# Patient Record
Sex: Male | Born: 1945 | Race: White | Hispanic: No | Marital: Married | Smoking: Never smoker
Health system: Southern US, Community
[De-identification: ages and names within clinical notes are randomized; demographics above are authoritative.]

## PROBLEM LIST (undated history)

## (undated) DIAGNOSIS — C44229 Squamous cell carcinoma of skin of left ear and external auricular canal: Secondary | ICD-10-CM

## (undated) DIAGNOSIS — I10 Essential (primary) hypertension: Secondary | ICD-10-CM

## (undated) DIAGNOSIS — K219 Gastro-esophageal reflux disease without esophagitis: Secondary | ICD-10-CM

## (undated) DIAGNOSIS — G629 Polyneuropathy, unspecified: Secondary | ICD-10-CM

## (undated) DIAGNOSIS — S61509A Unspecified open wound of unspecified wrist, initial encounter: Secondary | ICD-10-CM

## (undated) DIAGNOSIS — E785 Hyperlipidemia, unspecified: Secondary | ICD-10-CM

## (undated) DIAGNOSIS — N419 Inflammatory disease of prostate, unspecified: Secondary | ICD-10-CM

## (undated) DIAGNOSIS — G4730 Sleep apnea, unspecified: Secondary | ICD-10-CM

## (undated) DIAGNOSIS — K5909 Other constipation: Secondary | ICD-10-CM

## (undated) DIAGNOSIS — M25519 Pain in unspecified shoulder: Secondary | ICD-10-CM

## (undated) DIAGNOSIS — M1990 Unspecified osteoarthritis, unspecified site: Secondary | ICD-10-CM

## (undated) DIAGNOSIS — Z5189 Encounter for other specified aftercare: Secondary | ICD-10-CM

## (undated) DIAGNOSIS — J309 Allergic rhinitis, unspecified: Secondary | ICD-10-CM

## (undated) DIAGNOSIS — M542 Cervicalgia: Secondary | ICD-10-CM

## (undated) DIAGNOSIS — Z860100 Personal history of colon polyps, unspecified: Secondary | ICD-10-CM

## (undated) DIAGNOSIS — R079 Chest pain, unspecified: Secondary | ICD-10-CM

## (undated) DIAGNOSIS — T753XXA Motion sickness, initial encounter: Secondary | ICD-10-CM

## (undated) DIAGNOSIS — G709 Myoneural disorder, unspecified: Secondary | ICD-10-CM

## (undated) DIAGNOSIS — M549 Dorsalgia, unspecified: Secondary | ICD-10-CM

## (undated) DIAGNOSIS — R112 Nausea with vomiting, unspecified: Secondary | ICD-10-CM

## (undated) DIAGNOSIS — K5730 Diverticulosis of large intestine without perforation or abscess without bleeding: Secondary | ICD-10-CM

## (undated) DIAGNOSIS — Z9889 Other specified postprocedural states: Secondary | ICD-10-CM

## (undated) DIAGNOSIS — J189 Pneumonia, unspecified organism: Secondary | ICD-10-CM

## (undated) DIAGNOSIS — R51 Headache: Secondary | ICD-10-CM

## (undated) DIAGNOSIS — G4700 Insomnia, unspecified: Secondary | ICD-10-CM

## (undated) DIAGNOSIS — R42 Dizziness and giddiness: Secondary | ICD-10-CM

## (undated) DIAGNOSIS — Z8679 Personal history of other diseases of the circulatory system: Secondary | ICD-10-CM

## (undated) HISTORY — DX: Personal history of colon polyps, unspecified: Z86.0100

## (undated) HISTORY — PX: OTHER SURGICAL HISTORY: SHX169

## (undated) HISTORY — DX: Personal history of colonic polyps: Z86.010

## (undated) HISTORY — DX: Sleep apnea, unspecified: G47.30

## (undated) HISTORY — DX: Unspecified osteoarthritis, unspecified site: M19.90

## (undated) HISTORY — DX: Encounter for other specified aftercare: Z51.89

## (undated) HISTORY — DX: Inflammatory disease of prostate, unspecified: N41.9

## (undated) HISTORY — DX: Gastro-esophageal reflux disease without esophagitis: K21.9

## (undated) HISTORY — DX: Diverticulosis of large intestine without perforation or abscess without bleeding: K57.30

## (undated) HISTORY — DX: Hyperlipidemia, unspecified: E78.5

## (undated) HISTORY — PX: TONSILLECTOMY: SHX5217

## (undated) HISTORY — DX: Polyneuropathy, unspecified: G62.9

## (undated) HISTORY — DX: Motion sickness, initial encounter: T75.3XXA

## (undated) HISTORY — DX: Unspecified open wound of unspecified wrist, initial encounter: S61.509A

## (undated) HISTORY — DX: Personal history of other diseases of the circulatory system: Z86.79

## (undated) HISTORY — PX: ESOPHAGOGASTRODUODENOSCOPY: SHX1529

## (undated) HISTORY — PX: REPLACEMENT TOTAL KNEE BILATERAL: SUR1225

## (undated) HISTORY — DX: Insomnia, unspecified: G47.00

## (undated) HISTORY — DX: Headache: R51

## (undated) HISTORY — DX: Chest pain, unspecified: R07.9

## (undated) HISTORY — DX: Pain in unspecified shoulder: M25.519

## (undated) HISTORY — PX: COLONOSCOPY: SHX174

## (undated) HISTORY — DX: Dizziness and giddiness: R42

## (undated) HISTORY — DX: Dorsalgia, unspecified: M54.9

## (undated) HISTORY — PX: CYST EXCISION: SHX5701

## (undated) HISTORY — DX: Allergic rhinitis, unspecified: J30.9

## (undated) HISTORY — DX: Pneumonia, unspecified organism: J18.9

## (undated) HISTORY — DX: Squamous cell carcinoma of skin of left ear and external auricular canal: C44.229

## (undated) HISTORY — DX: Cervicalgia: M54.2

## (undated) HISTORY — DX: Essential (primary) hypertension: I10

## (undated) HISTORY — DX: Other constipation: K59.09

## (undated) HISTORY — DX: Myoneural disorder, unspecified: G70.9

---

## 1998-09-24 ENCOUNTER — Other Ambulatory Visit: Admission: RE | Admit: 1998-09-24 | Discharge: 1998-09-24 | Payer: Self-pay | Admitting: Gastroenterology

## 2000-06-30 HISTORY — PX: ANKLE ARTHROSCOPY W/ OPEN REPAIR: SHX1145

## 2000-08-06 ENCOUNTER — Ambulatory Visit (HOSPITAL_COMMUNITY): Admission: RE | Admit: 2000-08-06 | Discharge: 2000-08-06 | Payer: Self-pay | Admitting: Gastroenterology

## 2000-08-06 ENCOUNTER — Encounter: Payer: Self-pay | Admitting: Gastroenterology

## 2001-06-11 ENCOUNTER — Encounter: Payer: Self-pay | Admitting: Specialist

## 2001-06-11 ENCOUNTER — Inpatient Hospital Stay (HOSPITAL_COMMUNITY): Admission: EM | Admit: 2001-06-11 | Discharge: 2001-06-14 | Payer: Self-pay | Admitting: *Deleted

## 2001-06-11 ENCOUNTER — Encounter: Payer: Self-pay | Admitting: Emergency Medicine

## 2003-07-31 ENCOUNTER — Inpatient Hospital Stay (HOSPITAL_COMMUNITY): Admission: RE | Admit: 2003-07-31 | Discharge: 2003-08-03 | Payer: Self-pay | Admitting: Orthopedic Surgery

## 2003-08-22 ENCOUNTER — Encounter: Admission: RE | Admit: 2003-08-22 | Discharge: 2003-10-16 | Payer: Self-pay | Admitting: Orthopedic Surgery

## 2004-06-05 ENCOUNTER — Ambulatory Visit: Payer: Self-pay | Admitting: Internal Medicine

## 2004-07-05 ENCOUNTER — Ambulatory Visit: Payer: Self-pay | Admitting: Internal Medicine

## 2004-09-02 ENCOUNTER — Ambulatory Visit: Payer: Self-pay | Admitting: Internal Medicine

## 2004-09-05 ENCOUNTER — Encounter: Admission: RE | Admit: 2004-09-05 | Discharge: 2004-11-13 | Payer: Self-pay | Admitting: Internal Medicine

## 2004-11-04 ENCOUNTER — Ambulatory Visit: Payer: Self-pay | Admitting: Internal Medicine

## 2005-01-23 ENCOUNTER — Ambulatory Visit: Payer: Self-pay | Admitting: Internal Medicine

## 2005-04-18 ENCOUNTER — Ambulatory Visit: Payer: Self-pay | Admitting: Internal Medicine

## 2005-04-25 ENCOUNTER — Ambulatory Visit: Payer: Self-pay | Admitting: Internal Medicine

## 2005-07-18 ENCOUNTER — Ambulatory Visit: Payer: Self-pay | Admitting: Internal Medicine

## 2005-07-25 ENCOUNTER — Ambulatory Visit: Payer: Self-pay | Admitting: Internal Medicine

## 2005-09-12 ENCOUNTER — Ambulatory Visit: Payer: Self-pay | Admitting: Internal Medicine

## 2005-09-23 ENCOUNTER — Emergency Department (HOSPITAL_COMMUNITY): Admission: EM | Admit: 2005-09-23 | Discharge: 2005-09-24 | Payer: Self-pay | Admitting: Emergency Medicine

## 2005-10-24 ENCOUNTER — Ambulatory Visit: Payer: Self-pay | Admitting: Internal Medicine

## 2005-11-19 ENCOUNTER — Ambulatory Visit: Payer: Self-pay | Admitting: Internal Medicine

## 2005-12-24 ENCOUNTER — Ambulatory Visit: Payer: Self-pay | Admitting: Internal Medicine

## 2006-02-02 ENCOUNTER — Ambulatory Visit: Payer: Self-pay | Admitting: Internal Medicine

## 2006-05-14 ENCOUNTER — Ambulatory Visit: Payer: Self-pay | Admitting: Internal Medicine

## 2006-05-25 ENCOUNTER — Encounter: Payer: Self-pay | Admitting: Cardiology

## 2006-06-01 ENCOUNTER — Inpatient Hospital Stay (HOSPITAL_COMMUNITY): Admission: RE | Admit: 2006-06-01 | Discharge: 2006-06-04 | Payer: Self-pay | Admitting: Orthopedic Surgery

## 2006-06-17 ENCOUNTER — Ambulatory Visit: Payer: Self-pay | Admitting: Internal Medicine

## 2006-06-17 ENCOUNTER — Inpatient Hospital Stay (HOSPITAL_COMMUNITY): Admission: EM | Admit: 2006-06-17 | Discharge: 2006-06-20 | Payer: Self-pay | Admitting: Emergency Medicine

## 2006-06-22 ENCOUNTER — Encounter: Payer: Self-pay | Admitting: Vascular Surgery

## 2006-06-22 ENCOUNTER — Ambulatory Visit: Admission: RE | Admit: 2006-06-22 | Discharge: 2006-06-22 | Payer: Self-pay | Admitting: Internal Medicine

## 2006-06-22 ENCOUNTER — Ambulatory Visit: Payer: Self-pay | Admitting: Internal Medicine

## 2006-06-24 ENCOUNTER — Encounter: Payer: Self-pay | Admitting: Vascular Surgery

## 2006-06-24 ENCOUNTER — Ambulatory Visit: Payer: Self-pay | Admitting: Gastroenterology

## 2006-06-24 ENCOUNTER — Ambulatory Visit (HOSPITAL_COMMUNITY): Admission: RE | Admit: 2006-06-24 | Discharge: 2006-06-24 | Payer: Self-pay | Admitting: Internal Medicine

## 2006-07-01 ENCOUNTER — Encounter: Admission: RE | Admit: 2006-07-01 | Discharge: 2006-07-30 | Payer: Self-pay | Admitting: Orthopedic Surgery

## 2006-07-07 ENCOUNTER — Ambulatory Visit: Payer: Self-pay | Admitting: Gastroenterology

## 2006-07-22 ENCOUNTER — Ambulatory Visit: Payer: Self-pay | Admitting: Gastroenterology

## 2006-07-22 ENCOUNTER — Encounter: Payer: Self-pay | Admitting: Internal Medicine

## 2006-07-31 ENCOUNTER — Encounter: Admission: RE | Admit: 2006-07-31 | Discharge: 2006-09-03 | Payer: Self-pay | Admitting: Orthopedic Surgery

## 2006-08-24 ENCOUNTER — Ambulatory Visit: Payer: Self-pay | Admitting: Internal Medicine

## 2006-08-24 LAB — CONVERTED CEMR LAB
AST: 21 units/L (ref 0–37)
Albumin: 4.1 g/dL (ref 3.5–5.2)
Alkaline Phosphatase: 66 units/L (ref 39–117)
BUN: 10 mg/dL (ref 6–23)
Calcium: 9.8 mg/dL (ref 8.4–10.5)
Cholesterol: 314 mg/dL (ref 0–200)
Creatinine, Ser: 1.1 mg/dL (ref 0.4–1.5)
HDL: 41.4 mg/dL (ref >39.0)
Hemoglobin: 15.2 g/dL (ref 13.0–17.0)
MCHC: 33.8 g/dL (ref 30.0–36.0)
Monocytes Absolute: 0.7 10*3/uL (ref 0.2–0.7)
Neutro Abs: 7.2 10*3/uL (ref 1.4–7.7)
Neutrophils Relative %: 63 % (ref 43.0–77.0)
Potassium: 4.5 meq/L (ref 3.5–5.1)
RBC: 5.12 M/uL (ref 4.22–5.81)
Sodium: 140 meq/L (ref 135–145)
Total Protein: 7.4 g/dL (ref 6.0–8.3)
Triglycerides: 289 mg/dL (ref 0–149)
VLDL: 58 mg/dL — ABNORMAL HIGH (ref 0–40)
WBC: 11.3 10*3/uL — ABNORMAL HIGH (ref 4.5–10.5)

## 2006-08-31 ENCOUNTER — Ambulatory Visit: Payer: Self-pay | Admitting: Internal Medicine

## 2006-09-04 ENCOUNTER — Ambulatory Visit (HOSPITAL_COMMUNITY): Admission: RE | Admit: 2006-09-04 | Discharge: 2006-09-04 | Payer: Self-pay | Admitting: Internal Medicine

## 2006-09-04 ENCOUNTER — Encounter: Payer: Self-pay | Admitting: Internal Medicine

## 2006-12-03 ENCOUNTER — Ambulatory Visit: Payer: Self-pay | Admitting: Internal Medicine

## 2006-12-03 LAB — CONVERTED CEMR LAB
ALT: 23 U/L
AST: 23 U/L
Albumin: 4.2 g/dL
Alkaline Phosphatase: 64 U/L
Basophils Absolute: 0.1 K/uL
Basophils Relative: 0.7 %
Bilirubin, Direct: 0.1 mg/dL
Cholesterol: 232 mg/dL
Direct LDL: 153.9 mg/dL
Eosinophils Absolute: 0.1 K/uL
Eosinophils Relative: 1.2 %
HCT: 45.7 %
HDL: 41 mg/dL
Hemoglobin: 15.2 g/dL
Lymphocytes Relative: 25.2 %
MCHC: 33.3 g/dL
MCV: 91.4 fL
Monocytes Absolute: 0.8 K/uL — ABNORMAL HIGH
Monocytes Relative: 7.1 %
Neutro Abs: 7.7 K/uL
Neutrophils Relative %: 65.8 %
Platelets: 263 K/uL
RBC: 4.99 M/uL
RDW: 13.5 %
Total Bilirubin: 0.9 mg/dL
Total CHOL/HDL Ratio: 5.7
Total Protein: 7.5 g/dL
Triglycerides: 197 mg/dL — ABNORMAL HIGH
VLDL: 39 mg/dL
WBC: 11.6 10*3/microliter — ABNORMAL HIGH

## 2006-12-24 DIAGNOSIS — K219 Gastro-esophageal reflux disease without esophagitis: Secondary | ICD-10-CM | POA: Insufficient documentation

## 2006-12-24 DIAGNOSIS — M1990 Unspecified osteoarthritis, unspecified site: Secondary | ICD-10-CM

## 2006-12-24 DIAGNOSIS — E785 Hyperlipidemia, unspecified: Secondary | ICD-10-CM | POA: Insufficient documentation

## 2006-12-24 DIAGNOSIS — I10 Essential (primary) hypertension: Secondary | ICD-10-CM | POA: Insufficient documentation

## 2006-12-24 HISTORY — DX: Gastro-esophageal reflux disease without esophagitis: K21.9

## 2006-12-24 HISTORY — DX: Hyperlipidemia, unspecified: E78.5

## 2006-12-24 HISTORY — DX: Unspecified osteoarthritis, unspecified site: M19.90

## 2006-12-24 HISTORY — DX: Essential (primary) hypertension: I10

## 2007-01-29 ENCOUNTER — Telehealth: Payer: Self-pay | Admitting: *Deleted

## 2007-03-09 ENCOUNTER — Ambulatory Visit: Payer: Self-pay | Admitting: Internal Medicine

## 2007-03-09 DIAGNOSIS — T887XXA Unspecified adverse effect of drug or medicament, initial encounter: Secondary | ICD-10-CM | POA: Insufficient documentation

## 2007-03-09 DIAGNOSIS — R42 Dizziness and giddiness: Secondary | ICD-10-CM | POA: Insufficient documentation

## 2007-03-09 DIAGNOSIS — K5730 Diverticulosis of large intestine without perforation or abscess without bleeding: Secondary | ICD-10-CM

## 2007-03-09 HISTORY — DX: Diverticulosis of large intestine without perforation or abscess without bleeding: K57.30

## 2007-03-09 HISTORY — DX: Dizziness and giddiness: R42

## 2007-03-09 LAB — CONVERTED CEMR LAB
ALT: 27 units/L (ref 0–53)
AST: 26 units/L (ref 0–37)
Albumin: 4.1 g/dL (ref 3.5–5.2)
Bilirubin, Direct: 0.1 mg/dL (ref 0.0–0.3)
Cholesterol: 249 mg/dL (ref 0–200)
HDL: 39.7 mg/dL (ref >39.0)
Triglycerides: 345 mg/dL (ref 0–149)

## 2007-06-08 ENCOUNTER — Ambulatory Visit: Payer: Self-pay | Admitting: Internal Medicine

## 2007-06-08 LAB — CONVERTED CEMR LAB
Cholesterol, target level: 200 mg/dL
LDL Goal: 100 mg/dL

## 2007-06-11 ENCOUNTER — Telehealth: Payer: Self-pay | Admitting: Internal Medicine

## 2007-07-16 ENCOUNTER — Ambulatory Visit: Payer: Self-pay | Admitting: Internal Medicine

## 2007-07-16 DIAGNOSIS — M25519 Pain in unspecified shoulder: Secondary | ICD-10-CM

## 2007-07-16 HISTORY — DX: Pain in unspecified shoulder: M25.519

## 2007-10-13 ENCOUNTER — Ambulatory Visit: Payer: Self-pay | Admitting: Internal Medicine

## 2007-10-13 DIAGNOSIS — S61509A Unspecified open wound of unspecified wrist, initial encounter: Secondary | ICD-10-CM

## 2007-10-13 HISTORY — DX: Unspecified open wound of unspecified wrist, initial encounter: S61.509A

## 2007-11-17 ENCOUNTER — Encounter: Payer: Self-pay | Admitting: Internal Medicine

## 2007-12-30 ENCOUNTER — Ambulatory Visit: Payer: Self-pay | Admitting: Internal Medicine

## 2007-12-30 DIAGNOSIS — N419 Inflammatory disease of prostate, unspecified: Secondary | ICD-10-CM

## 2007-12-30 HISTORY — DX: Inflammatory disease of prostate, unspecified: N41.9

## 2007-12-30 LAB — CONVERTED CEMR LAB
Basophils Absolute: 0.1 10*3/uL (ref 0.0–0.1)
Basophils Relative: 0.7 % (ref 0.0–1.0)
Bilirubin, Direct: 0.1 mg/dL (ref 0.0–0.3)
CO2: 30 meq/L (ref 19–32)
Chloride: 101 meq/L (ref 96–112)
Eosinophils Absolute: 0.1 10*3/uL (ref 0.0–0.7)
GFR calc non Af Amer: 73 mL/min
Glucose, Bld: 102 mg/dL — ABNORMAL HIGH (ref 70–99)
MCV: 93.7 fL (ref 78.0–100.0)
Monocytes Relative: 8 % (ref 3.0–12.0)
Neutrophils Relative %: 67 % (ref 43.0–77.0)
WBC: 11.1 10*3/uL — ABNORMAL HIGH (ref 4.5–10.5)

## 2008-04-04 ENCOUNTER — Ambulatory Visit: Payer: Self-pay | Admitting: Internal Medicine

## 2008-04-04 LAB — CONVERTED CEMR LAB
Albumin: 4.2 g/dL (ref 3.5–5.2)
Alkaline Phosphatase: 65 units/L (ref 39–117)
BUN: 11 mg/dL (ref 6–23)
Bilirubin, Direct: 0.1 mg/dL (ref 0.0–0.3)
CO2: 31 meq/L (ref 19–32)
Chloride: 103 meq/L (ref 96–112)
Cholesterol: 200 mg/dL (ref 0–200)
Creatinine, Ser: 1.2 mg/dL (ref 0.4–1.5)
Eosinophils Absolute: 0.1 10*3/uL (ref 0.0–0.7)
MCV: 91.2 fL (ref 78.0–100.0)
Monocytes Absolute: 0.8 10*3/uL (ref 0.1–1.0)
Platelets: 226 10*3/uL (ref 150–400)
Potassium: 4.6 meq/L (ref 3.5–5.1)
RBC: 4.97 M/uL (ref 4.22–5.81)
RDW: 12.5 % (ref 11.5–14.6)
Sodium: 141 meq/L (ref 135–145)
Triglycerides: 228 mg/dL (ref 0–149)
VLDL: 46 mg/dL — ABNORMAL HIGH (ref 0–40)
WBC: 10.9 10*3/uL — ABNORMAL HIGH (ref 4.5–10.5)

## 2008-08-29 ENCOUNTER — Ambulatory Visit: Payer: Self-pay | Admitting: Internal Medicine

## 2008-10-03 ENCOUNTER — Ambulatory Visit: Payer: Self-pay | Admitting: Internal Medicine

## 2008-10-04 ENCOUNTER — Encounter: Payer: Self-pay | Admitting: Internal Medicine

## 2008-11-30 ENCOUNTER — Ambulatory Visit: Payer: Self-pay | Admitting: Internal Medicine

## 2008-11-30 LAB — CONVERTED CEMR LAB
Basophils Relative: 0.1 % (ref 0.0–3.0)
CO2: 29 meq/L (ref 19–32)
Chloride: 103 meq/L (ref 96–112)
Cholesterol: 177 mg/dL (ref 0–200)
Eosinophils Absolute: 0.2 10*3/uL (ref 0.0–0.7)
Eosinophils Relative: 1.5 % (ref 0.0–5.0)
Glucose, Bld: 96 mg/dL (ref 70–99)
Lymphocytes Relative: 27.7 % (ref 12.0–46.0)
Lymphs Abs: 3 10*3/uL (ref 0.7–4.0)
Monocytes Absolute: 0.7 10*3/uL (ref 0.1–1.0)
Monocytes Relative: 6.7 % (ref 3.0–12.0)
Neutro Abs: 7 10*3/uL (ref 1.4–7.7)
Neutrophils Relative %: 64 % (ref 43.0–77.0)
PSA: 0.92 ng/mL (ref 0.10–4.00)
Platelets: 202 10*3/uL (ref 150.0–400.0)
Potassium: 3.6 meq/L (ref 3.5–5.1)
RBC: 4.65 M/uL (ref 4.22–5.81)
Specific Gravity, Urine: 1.025
Total CHOL/HDL Ratio: 4
Triglycerides: 219 mg/dL — ABNORMAL HIGH (ref 0.0–149.0)
Urobilinogen, UA: 0.2

## 2008-12-07 ENCOUNTER — Ambulatory Visit: Payer: Self-pay | Admitting: Internal Medicine

## 2009-04-06 ENCOUNTER — Ambulatory Visit: Payer: Self-pay | Admitting: Internal Medicine

## 2009-04-06 DIAGNOSIS — R51 Headache: Secondary | ICD-10-CM

## 2009-04-06 DIAGNOSIS — Z8679 Personal history of other diseases of the circulatory system: Secondary | ICD-10-CM

## 2009-04-06 DIAGNOSIS — R519 Headache, unspecified: Secondary | ICD-10-CM | POA: Insufficient documentation

## 2009-04-06 HISTORY — DX: Headache: R51

## 2009-04-06 HISTORY — DX: Personal history of other diseases of the circulatory system: Z86.79

## 2009-04-06 LAB — CONVERTED CEMR LAB
BUN: 13 mg/dL (ref 6–23)
CO2: 29 meq/L (ref 19–32)
Chloride: 99 meq/L (ref 96–112)
Creatinine, Ser: 1.1 mg/dL (ref 0.4–1.5)
Potassium: 4.4 meq/L (ref 3.5–5.1)

## 2009-04-09 ENCOUNTER — Ambulatory Visit: Payer: Self-pay | Admitting: Internal Medicine

## 2009-04-13 ENCOUNTER — Encounter: Admission: RE | Admit: 2009-04-13 | Discharge: 2009-04-13 | Payer: Self-pay | Admitting: Internal Medicine

## 2009-04-21 ENCOUNTER — Encounter: Payer: Self-pay | Admitting: Cardiology

## 2009-04-21 ENCOUNTER — Emergency Department (HOSPITAL_COMMUNITY): Admission: EM | Admit: 2009-04-21 | Discharge: 2009-04-21 | Payer: Self-pay | Admitting: Emergency Medicine

## 2009-04-25 ENCOUNTER — Ambulatory Visit: Payer: Self-pay | Admitting: Family Medicine

## 2009-04-25 DIAGNOSIS — R079 Chest pain, unspecified: Secondary | ICD-10-CM

## 2009-04-25 HISTORY — DX: Chest pain, unspecified: R07.9

## 2009-04-26 ENCOUNTER — Ambulatory Visit: Payer: Self-pay | Admitting: Cardiology

## 2009-05-01 ENCOUNTER — Telehealth (INDEPENDENT_AMBULATORY_CARE_PROVIDER_SITE_OTHER): Payer: Self-pay | Admitting: *Deleted

## 2009-05-02 ENCOUNTER — Ambulatory Visit: Payer: Self-pay | Admitting: Cardiovascular Disease

## 2009-05-02 ENCOUNTER — Encounter (HOSPITAL_COMMUNITY): Admission: RE | Admit: 2009-05-02 | Discharge: 2009-06-29 | Payer: Self-pay | Admitting: Cardiology

## 2009-05-02 ENCOUNTER — Ambulatory Visit: Payer: Self-pay

## 2009-05-30 ENCOUNTER — Telehealth: Payer: Self-pay | Admitting: Internal Medicine

## 2009-07-04 ENCOUNTER — Ambulatory Visit: Payer: Self-pay | Admitting: Internal Medicine

## 2009-10-03 ENCOUNTER — Ambulatory Visit: Payer: Self-pay | Admitting: Internal Medicine

## 2009-10-03 ENCOUNTER — Encounter (INDEPENDENT_AMBULATORY_CARE_PROVIDER_SITE_OTHER): Payer: Self-pay | Admitting: *Deleted

## 2009-10-05 ENCOUNTER — Telehealth: Payer: Self-pay | Admitting: Internal Medicine

## 2009-11-06 ENCOUNTER — Encounter (INDEPENDENT_AMBULATORY_CARE_PROVIDER_SITE_OTHER): Payer: Self-pay | Admitting: *Deleted

## 2009-11-06 ENCOUNTER — Ambulatory Visit: Payer: Self-pay | Admitting: Gastroenterology

## 2009-11-06 DIAGNOSIS — K5909 Other constipation: Secondary | ICD-10-CM | POA: Insufficient documentation

## 2009-11-06 HISTORY — DX: Other constipation: K59.09

## 2009-11-06 LAB — CONVERTED CEMR LAB
AST: 25 units/L (ref 0–37)
Alkaline Phosphatase: 75 units/L (ref 39–117)
Bilirubin, Direct: 0 mg/dL (ref 0.0–0.3)
CO2: 31 meq/L (ref 19–32)
Calcium: 9.7 mg/dL (ref 8.4–10.5)
Creatinine, Ser: 1.2 mg/dL (ref 0.4–1.5)
Eosinophils Absolute: 0.2 10*3/uL (ref 0.0–0.7)
GFR calc non Af Amer: 66.33 mL/min (ref >60)
Glucose, Bld: 111 mg/dL — ABNORMAL HIGH (ref 70–99)
Hemoglobin: 15.6 g/dL (ref 13.0–17.0)
IgA: 212 mg/dL (ref 68–378)
Iron: 64 ug/dL (ref 42–165)
Lymphocytes Relative: 20.7 % (ref 12.0–46.0)
MCHC: 34.5 g/dL (ref 30.0–36.0)
MCV: 93.2 fL (ref 78.0–100.0)
Monocytes Absolute: 1 10*3/uL (ref 0.1–1.0)
Platelets: 276 10*3/uL (ref 150.0–400.0)
Potassium: 4.4 meq/L (ref 3.5–5.1)
RDW: 13.5 % (ref 11.5–14.6)
Saturation Ratios: 20.9 % (ref 20.0–50.0)
Sed Rate: 12 mm/hr (ref 0–22)
Transferrin: 218.5 mg/dL (ref 212.0–360.0)

## 2009-11-14 ENCOUNTER — Ambulatory Visit: Payer: Self-pay | Admitting: Gastroenterology

## 2009-11-14 LAB — CONVERTED CEMR LAB: UREASE: NEGATIVE

## 2009-11-19 ENCOUNTER — Encounter: Payer: Self-pay | Admitting: Gastroenterology

## 2009-12-04 ENCOUNTER — Telehealth (INDEPENDENT_AMBULATORY_CARE_PROVIDER_SITE_OTHER): Payer: Self-pay | Admitting: *Deleted

## 2010-02-21 ENCOUNTER — Ambulatory Visit: Payer: Self-pay | Admitting: Family Medicine

## 2010-02-21 DIAGNOSIS — M549 Dorsalgia, unspecified: Secondary | ICD-10-CM

## 2010-02-21 HISTORY — DX: Dorsalgia, unspecified: M54.9

## 2010-03-25 ENCOUNTER — Ambulatory Visit: Payer: Self-pay | Admitting: Internal Medicine

## 2010-05-22 ENCOUNTER — Encounter: Payer: Self-pay | Admitting: Internal Medicine

## 2010-05-27 ENCOUNTER — Ambulatory Visit: Payer: Self-pay | Admitting: Internal Medicine

## 2010-05-27 DIAGNOSIS — G4700 Insomnia, unspecified: Secondary | ICD-10-CM | POA: Insufficient documentation

## 2010-05-27 DIAGNOSIS — G4730 Sleep apnea, unspecified: Secondary | ICD-10-CM

## 2010-05-27 DIAGNOSIS — J309 Allergic rhinitis, unspecified: Secondary | ICD-10-CM

## 2010-05-27 DIAGNOSIS — M542 Cervicalgia: Secondary | ICD-10-CM | POA: Insufficient documentation

## 2010-05-27 HISTORY — DX: Insomnia, unspecified: G47.00

## 2010-05-27 HISTORY — DX: Allergic rhinitis, unspecified: J30.9

## 2010-05-27 HISTORY — DX: Cervicalgia: M54.2

## 2010-06-13 ENCOUNTER — Encounter: Payer: Self-pay | Admitting: Internal Medicine

## 2010-06-18 ENCOUNTER — Ambulatory Visit (HOSPITAL_BASED_OUTPATIENT_CLINIC_OR_DEPARTMENT_OTHER): Admission: RE | Admit: 2010-06-18 | Payer: Self-pay | Source: Home / Self Care | Admitting: Internal Medicine

## 2010-06-19 ENCOUNTER — Telehealth: Payer: Self-pay | Admitting: Internal Medicine

## 2010-06-21 ENCOUNTER — Encounter
Admission: RE | Admit: 2010-06-21 | Discharge: 2010-06-21 | Payer: Self-pay | Source: Home / Self Care | Attending: Orthopaedic Surgery | Admitting: Orthopaedic Surgery

## 2010-07-04 ENCOUNTER — Ambulatory Visit: Admit: 2010-07-04 | Payer: Self-pay | Admitting: Family Medicine

## 2010-07-11 ENCOUNTER — Encounter: Payer: Self-pay | Admitting: Internal Medicine

## 2010-07-28 LAB — CONVERTED CEMR LAB: PSA: 0.92 ng/mL

## 2010-07-30 NOTE — Assessment & Plan Note (Signed)
Summary: stomach problems/mhf   Vital Signs:  Patient Profile:   65 Years Old Male Height:     71 inches Weight:      220 pounds Temp:     98.4 degrees F oral Pulse rate:   76 / minute Resp:     14 per minute BP sitting:   140 / 80  (left arm)  Vitals Entered By: Willy Eddy, LPN (August 29, 1608 1:35 PM)                 Chief Complaint:  c/o abd discomfort primarily after eatingt.  History of Present Illness: Increased stomach discomfort related to the chronic dizzines Increased nausia without vomiting Ins laying donw more due to the motion sickness Has reflue of food into the mouth with more chest pressure constipated  Hypertension History:      He denies headache, chest pain, palpitations, dyspnea with exertion, orthopnea, PND, peripheral edema, visual symptoms, neurologic problems, syncope, and side effects from treatment.        Positive major cardiovascular risk factors include male age 38 years old or older, hyperlipidemia, and hypertension.       Prior Medication List:  NEXIUM 40 MG  CPDR (ESOMEPRAZOLE MAGNESIUM) Take 1 tablet by mouth two times a day PROMETHAZINE HCL 50 MG  TABS (PROMETHAZINE HCL) Every 4 hours as needed CRESTOR 10 MG  TABS (ROSUVASTATIN CALCIUM) Take 1 tablet by mouth once a day METAMUCIL   CAPS (PSYLLIUM CAPS) as needed HYDROMORPHONE HCL 4 MG  TABS (HYDROMORPHONE HCL) ONE by mouth Q 6 HOUR PRN MECLIZINE HCL 12.5 MG TABS (MECLIZINE HCL) two by mouth q 6 hour as needed AMITRIPTYLINE HCL 25 MG  TABS (AMITRIPTYLINE HCL) 5 pills each night ACIDOPHILUS EXTRA STRENGTH   CAPS (LACTOBACILLUS) once daily BL STOOL SOFTENER 100 MG  CAPS (DOCUSATE SODIUM) qdhs PROMETHEGAN 25 MG  SUPP (PROMETHAZINE HCL) one pr as needed nausia every 6 hours as needed HYDROCHLOROTHIAZIDE 25 MG  TABS (HYDROCHLOROTHIAZIDE) 1/2 once daily FLECTOR 1.3 %  PTCH (DICLOFENAC EPOLAMINE) APPLY TO SHOULDER  EVERY 12 HOURS AS NEEDED TRAMADOL HCL 50 MG TABS (TRAMADOL HCL) one by  mouth q 4-6 hour as needed pain RANITIDINE HCL 300 MG TABS (RANITIDINE HCL) one by mouth q HS with a reglan METOCLOPRAMIDE HCL 5 MG TABS (METOCLOPRAMIDE HCL) one by mouth q HS with zantac   Current Allergies (reviewed today): ! CODEINE  Past Medical History:    Reviewed history from 03/09/2007 and no changes required:       GERD       Hyperlipidemia       DJD-knee       Chronic Vertigo       Benign Brain lesion       Osteoarthritis       Hypertension       Diverticulosis, colon   Family History:    Reviewed history from 03/09/2007 and no changes required:       Father suicide       Family History of CAD Male 1st degree relative <50       Mother died from        Family History Kidney disease  Social History:    Reviewed history from 12/24/2006 and no changes required:       Married       Alcohol use-no   Risk Factors: Alcohol use:  no   Review of Systems  The patient denies anorexia, fever, weight loss, weight gain,  vision loss, decreased hearing, hoarseness, chest pain, syncope, dyspnea on exertion, peripheral edema, prolonged cough, headaches, hemoptysis, abdominal pain, melena, hematochezia, severe indigestion/heartburn, hematuria, incontinence, genital sores, muscle weakness, suspicious skin lesions, transient blindness, difficulty walking, depression, unusual weight change, abnormal bleeding, enlarged lymph nodes, angioedema, and breast masses.     Physical Exam  General:     Well-developed,well-nourished,in no acute distress; alert,appropriate and cooperative throughout examination Eyes:     No corneal or conjunctival inflammation noted. EOMI. Perrla. Funduscopic exam benign, without hemorrhages, exudates or papilledema. Vision grossly normal. Nose:     nasal dischargemucosal pallor.   Mouth:     tonsil hypertropied, posterior lymphoid hypertrophy, and postnasal drip.   Neck:     No deformities, masses, or tenderness noted.full ROM.   Lungs:     Normal  respiratory effort, chest expands symmetrically. Lungs are clear to auscultation, no crackles or wheezes. Heart:     Normal rate and regular rhythm. S1 and S2 normal without gallop, murmur, click, rub or other extra sounds. Abdomen:     soft and epigastric tenderness.   Msk:     No deformity or scoliosis noted of thoracic or lumbar spine.  left buttock painfull Neurologic:     tremor of fine motor in hands alert & oriented X3.      Impression & Recommendations:  Problem # 1:  HYPERTENSION (ICD-401.9) Assessment: Improved  His updated medication list for this problem includes:    Hydrochlorothiazide 25 Mg Tabs (Hydrochlorothiazide) .Marland Kitchen... 1/2 once daily  BP today: 140/80 Prior BP: 150/84 (12/30/2007)  10 Yr Risk Heart Disease: 22 % Prior 10 Yr Risk Heart Disease: Not enough information (06/08/2007)  Labs Reviewed: Creat: 1.2 (04/04/2008) Chol: 200 (04/04/2008)   HDL: 38.4 (04/04/2008)   LDL: 108.8 (04/04/2008)   TG: 228 (04/04/2008)   Problem # 2:  DIZZINESS, CHRONIC (ICD-780.4) Assessment: Unchanged  His updated medication list for this problem includes:    Promethazine Hcl 50 Mg Tabs (Promethazine hcl) ..... Every 4 hours as needed    Meclizine Hcl 12.5 Mg Tabs (Meclizine hcl) .Marland Kitchen..Marland Kitchen Two by mouth q 6 hour as needed    Promethegan 25 Mg Supp (Promethazine hcl) ..... One pr as needed nausia every 6 hours as needed Demonstrated maneuvers to self-treat vertigo. Patient to call to be seen if no improvement in 10-14 days, sooner if worse.   Problem # 3:  GERD (ICD-530.81)  His updated medication list for this problem includes:    Aciphex 20 Mg Tbec (Rabeprazole sodium) ..... One by mouth daily  Diagnostics Reviewed:  Discussed lifestyle modifications, diet, antacids/medications, and preventive measures. Handout provided.   Complete Medication List: 1)  Promethazine Hcl 50 Mg Tabs (Promethazine hcl) .... Every 4 hours as needed 2)  Crestor 10 Mg Tabs (Rosuvastatin calcium)  .... Take 1 tablet by mouth once a day 3)  Metamucil Caps (Psyllium caps) .... As needed 4)  Hydromorphone Hcl 4 Mg Tabs (Hydromorphone hcl) .... One by mouth q 6 hour prn 5)  Meclizine Hcl 12.5 Mg Tabs (Meclizine hcl) .... Two by mouth q 6 hour as needed 6)  Amitriptyline Hcl 25 Mg Tabs (Amitriptyline hcl) .... 5 pills each night 7)  Acidophilus Extra Strength Caps (Lactobacillus) .... Once daily 8)  Bl Stool Softener 100 Mg Caps (Docusate sodium) .... Qdhs 9)  Promethegan 25 Mg Supp (Promethazine hcl) .... One pr as needed nausia every 6 hours as needed 10)  Hydrochlorothiazide 25 Mg Tabs (Hydrochlorothiazide) .... 1/2 once daily 11)  Flector 1.3 % Ptch (Diclofenac epolamine) .... Apply to shoulder  every 12 hours as needed 12)  Tramadol Hcl 50 Mg Tabs (Tramadol hcl) .... One by mouth q 4-6 hour as needed pain 13)  Aciphex 20 Mg Tbec (Rabeprazole sodium) .... One by mouth daily 14)  Metoclopramide Hcl 5 Mg Tabs (Metoclopramide hcl) .... One by mouth q ac supper and bedtime  Hypertension Assessment/Plan:      The patient's hypertensive risk group is category B: At least one risk factor (excluding diabetes) with no target organ damage.  His calculated 10 year risk of coronary heart disease is 22 %.  Today's blood pressure is 140/80.  His blood pressure goal is < 140/90.   Patient Instructions: 1)  Please schedule a follow-up appointment in 1 month.   Prescriptions: METOCLOPRAMIDE HCL 5 MG TABS (METOCLOPRAMIDE HCL) one by mouth q ac supper and bedtime  #60 x 6   Entered and Authorized by:   Stacie Glaze MD   Signed by:   Stacie Glaze MD on 08/29/2008   Method used:   Electronically to        [facility] [phone]* (retail)       [address]       [place], [place]  [address]       Ph: [phone] or [phone]       Fax: [phone]   RxID:   [id number]

## 2010-07-30 NOTE — Progress Notes (Signed)
Summary: Pt checking on status of referral to GI  Phone Note Call from Patient Call back at [phone]   Caller: Patient Summary of Call: Pt called to check on referral status. Please call pt at [phone]. Initial call taken by: Lucy Antigua,  October 05, 2009 4:13 PM  Follow-up for Phone Call        Appt Scheduled.  Pt aware. Follow-up by: Corky Mull,  October 08, 2009 8:30 AM

## 2010-07-30 NOTE — Letter (Signed)
Summary: New Patient letter  [facility]  [address] [place], [place] [address]   Phone: [phone]  Fax: [phone]       10/03/2009 MRN: [id number]  [name] [address] [place], [place]  [address]  Dear Mr. Blizard,  Welcome to the Gastroenterology Division at [facility].    You are scheduled to see Dr.  Jarold Motto on 11-06-09 at 2:45PM on the 3rd floor at [facility], [address]  We ask that you try to arrive at our office 15 minutes prior to your appointment time to allow for check-in.  We would like you to complete the enclosed self-administered evaluation form prior to your visit and bring it with you on the day of your appointment.  We will review it with you.  Also, please bring a complete list of all your medications or, if you prefer, bring the medication bottles and we will list them.  Please bring your insurance card so that we may make a copy of it.  If your insurance requires a referral to see a specialist, please bring your referral form from your primary care physician.  Co-payments are due at the time of your visit and may be paid by cash, check or credit card.     Your office visit will consist of a consult with your physician (includes a physical exam), any laboratory testing he/she may order, scheduling of any necessary diagnostic testing (e.g. x-ray, ultrasound, CT-scan), and scheduling of a procedure (e.g. Endoscopy, Colonoscopy) if required.  Please allow enough time on your schedule to allow for any/all of these possibilities.    If you cannot keep your appointment, please call [phone] to cancel or reschedule prior to your appointment date.  This allows [place] the opportunity to schedule an appointment for another patient in need of care.  If you do not cancel or reschedule by 5 p.m. the business day prior to your appointment date, you will be charged a $50.00 late cancellation/no-show fee.    Thank you for choosing [facility] for your medical needs.  We appreciate the opportunity to care for you.  Please visit [place] at our website  to learn more about our practice.                     Sincerely,                                                             The Gastroenterology Division

## 2010-07-30 NOTE — Letter (Signed)
Summary: [facility]  [facility]   Imported By: Maryln Gottron 05/31/2010 11:06:07  _____________________________________________________________________  External Attachment:    Type:   Image     Comment:   External Document

## 2010-07-30 NOTE — Assessment & Plan Note (Signed)
Summary: 3 MONTH ROV/NJR   Vital Signs:  Patient profile:   65 year old male Height:      68 inches Weight:      212 pounds BMI:     32.35 Temp:     98.2 degrees F oral Pulse rate:   80 / minute Resp:     14 per minute BP sitting:   144 / 80  (left arm)  Vitals Entered By: Willy Eddy, LPN (October 03, 4096 9:04 AM) CC: roa- med refills, Abdominal Pain   Primary Care Provider:  Stacie Glaze MD  CC:  roa- med refills and Abdominal Pain.  History of Present Illness: The pt is followed for HTN  AND GERD HAVING increased GERD with prior symptoms of stricture dz has has prior dilation by Dr Jarold Motto in the past  Dyspepsia History:      There is a prior history of GERD.  He notes that there have been breakthrough symptoms despite maximum H-2 blocker or PPI therapy.  The patient does not have a prior history of documented ulcer disease.  The dominant symptom is heartburn or acid reflux.  A prior EGD has been done which showed moderate or severe esophagitis.     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) 6)  Open Wound of Wrist, Complicated  (ICD-881.12) 7)  Shoulder Pain, Bilateral  (ICD-719.41) 8)  Advef, Drug/medicinal/biological Subst Nos  (ICD-995.20) 9)  Dizziness, Chronic  (ICD-780.4) 10)  Diverticulosis, Colon  (ICD-562.10) 11)  Family History of Cad Male 1st Degree Relative <50  (ICD-V17.3) 12)  Hypertension  (ICD-401.9) 13)  Osteoarthritis  (ICD-715.90) 14)  Hyperlipidemia  (ICD-272.4) 15)  Gerd  (ICD-530.81)  Current Problems (verified): 1)  Chest Pain Unspecified  (ICD-786.50) 2)  Transient Ischemic Attacks, Hx of  (ICD-V12.50) 3)  Headache, Sinus  (ICD-784.0) 4)  Physical  Examination  (ICD-V70.0) 5)  Prostatitis, 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 Every 4 Hours As Needed 2)  Crestor 10 Mg  Tabs (Rosuvastatin Calcium) .... Take 1 Tablet By Mouth Once A Day 3)  Metamucil   Caps (Psyllium Caps) .... As Needed 4)  Hydromorphone Hcl 4 Mg  Tabs (Hydromorphone Hcl) .... One By Mouth Q 6 Hour Prn 5)  Meclizine Hcl 12.5 Mg Tabs (Meclizine Hcl) .... Two By Mouth Q 6 Hour As Needed 6)  Amitriptyline Hcl 25 Mg  Tabs (Amitriptyline Hcl) .... 5 Pills Each Night 7)  Acidophilus Extra Strength   Caps (Lactobacillus) .... Once Daily 8)  Bl Stool Softener 100 Mg  Caps (Docusate Sodium) .... Qdhs 9)  Promethegan 25 Mg  Supp (Promethazine Hcl) .... One Pr As Needed Nausia Every 6 Hours As Needed 10)  Triamterene-Hctz 37.5-25 Mg Tabs (Triamterene-Hctz) .... One By  Mouth Daily  Replaces The Hctz 11)  Flector 1.3 %  Ptch (Diclofenac Epolamine) .... Apply To Shoulder  Every 12 Hours As Needed 12)  Tramadol Hcl 50 Mg Tabs (Tramadol Hcl) .... One By Mouth Q 4-6 Hour As Needed Pain 13)  Nexium 40 Mg Cpdr (Esomeprazole Magnesium) .... One By Mouth Daily 14)  Metoclopramide Hcl 5 Mg Tabs (Metoclopramide Hcl) .... One By Mouth Q Ac Supper and Bedtime 15)  Zithromax Z-Pak 250 Mg Tabs (Azithromycin) .... As Directed  Current Medications (verified): 1)  Promethazine Hcl 50 Mg  Tabs (Promethazine Hcl) .... Every 4 Hours As Needed 2)  Crestor 10 Mg  Tabs (Rosuvastatin Calcium) .... Take 1 Tablet By  Mouth Once A Day 3)  Metamucil   Caps (Psyllium Caps) .... As Needed 4)  Hydromorphone Hcl 4 Mg  Tabs (Hydromorphone Hcl) .... One By Mouth Q 6 Hour Prn 5)  Meclizine Hcl 12.5 Mg Tabs (Meclizine Hcl) .... Two By Mouth Q 6 Hour As Needed 6)  Amitriptyline Hcl 25 Mg  Tabs (Amitriptyline Hcl) .... 5 Pills Each Night 7)  Acidophilus Extra Strength   Caps (Lactobacillus) .... Once Daily 8)  Bl Stool Softener 100 Mg  Caps (Docusate Sodium) .... Qdhs 9)  Promethegan 25 Mg  Supp (Promethazine Hcl) .... One Pr As Needed Nausia Every 6 Hours As Needed 10)  Triamterene-Hctz 37.5-25 Mg Tabs (Triamterene-Hctz) .... One By Mouth Daily  Replaces The Hctz 11)  Flector 1.3 %  Ptch (Diclofenac Epolamine) .... Apply To Shoulder  Every 12 Hours As Needed 12)  Tramadol Hcl 50 Mg Tabs (Tramadol Hcl) .... One By Mouth Q 4-6 Hour As Needed Pain 13)  Nexium 40 Mg Cpdr (Esomeprazole Magnesium) .... One By Mouth Daily 14)  Metoclopramide Hcl 5 Mg Tabs (Metoclopramide Hcl) .... One By Mouth Q Ac Supper and Bedtime 15)  Zithromax Z-Pak 250 Mg Tabs (Azithromycin) .... As Directed  Allergies (verified): 1)  ! Codeine  Past History:  Past medical, surgical, family and social histories (including risk factors) reviewed, and no changes noted (except as noted below).  Past Medical History: Reviewed history from 03/09/2007 and no changes required. GERD Hyperlipidemia DJD-knee Chronic Vertigo Benign Brain lesion Osteoarthritis Hypertension Diverticulosis, colon  Past Surgical History: Reviewed history from 12/24/2006 and no changes required. 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 Father suicide Family History of CAD Male 1st degree relative <50 Mother died from  Family History Kidney disease His brother had an MI at age 67  Social History: Reviewed history from 04/26/2009 and no changes  required. Married Alcohol use-no Nonsmoker He used to work at [employer] but has been disabled for the past 5 years.  Review of Systems  The patient denies anorexia, fever, weight loss, weight gain, vision loss, decreased hearing, hoarseness, chest pain, syncope, dyspnea on exertion, peripheral edema, prolonged cough, headaches, hemoptysis, abdominal pain, melena, hematochezia, severe indigestion/heartburn, hematuria, incontinence, genital sores, muscle weakness, suspicious skin lesions, transient blindness, difficulty walking, depression, unusual weight change, abnormal bleeding, enlarged lymph nodes, angioedema, and breast masses.    Physical Exam  General:  Well-developed,well-nourished,in no acute distress; alert,appropriate and cooperative throughout examination Head:  Normocephalic and atraumatic without obvious abnormalities. No apparent alopecia or balding. Eyes:  increased nystagmus.   Ears:  R ear normal and L ear normal.   Nose:  nasal dischargemucosal pallor.   Mouth:  tonsil hypertropied, posterior lymphoid hypertrophy, and postnasal drip.  Neck:  No deformities, masses, or tenderness noted. Lungs:  Normal respiratory effort, chest expands symmetrically. Lungs are clear to auscultation, no crackles or wheezes. Heart:  Normal rate and regular rhythm. S1 and S2 normal without gallop, murmur, click, rub or other extra sounds. Abdomen:  soft and nontender. Msk:  normal ROM, no joint swelling, and no joint warmth.   Pulses:  R and L carotid,radial,femoral,dorsalis pedis and posterior tibial pulses are full and equal bilaterally Extremities:  No clubbing, cyanosis, edema, or deformity noted with normal full range of motion of all joints.   Neurologic:  alert & oriented X3 and gait normal.     Impression & Recommendations:  Problem # 1:  GERD (ICD-530.81)  referral to [facility] for colon and egd with recurrent stricture symptoms His updated medication list for this problem  includes:    Nexium 40 Mg Cpdr (Esomeprazole magnesium) ..... One by mouth daily he feels that the aciphex works best with break through with the nexium increase to two times a day   EGD: Findings: Esophagitis  Findings: Gastritis   (07/22/2006)  Labs Reviewed: Hgb: 15.2 (11/30/2008)   Hct: 44.0 (11/30/2008)  Orders: Gastroenterology Referral (GI)  Problem # 2:  HYPERTENSION (ICD-401.9) Assessment: Unchanged  His updated medication list for this problem includes:    Triamterene-hctz 37.5-25 Mg Tabs (Triamterene-hctz) ..... One by mouth daily  replaces the hctz  BP today: 144/80 Prior BP: 140/80 (07/04/2009)  Prior 10 Yr Risk Heart Disease: Not enough information (10/03/2008)  Labs Reviewed: K+: 4.4 (04/06/2009) Creat: : 1.1 (04/06/2009)   Chol: 177 (11/30/2008)   HDL: 39.40 (11/30/2008)   LDL: DEL (04/04/2008)   TG: 219.0 (11/30/2008)  Problem # 3:  HYPERLIPIDEMIA (ICD-272.4)  His updated medication list for this problem includes:    Crestor 10 Mg Tabs (Rosuvastatin calcium) .Marland Kitchen... Take 1 tablet by mouth once a day  Labs Reviewed: SGOT: 22 (11/30/2008)   SGPT: 20 (11/30/2008)  Lipid Goals: Chol Goal: 200 (06/08/2007)   HDL Goal: 40 (06/08/2007)   LDL Goal: 100 (06/08/2007)   TG Goal: 150 (06/08/2007)  Prior 10 Yr Risk Heart Disease: Not enough information (10/03/2008)   HDL:39.40 (11/30/2008), 38.4 (04/04/2008)  LDL:DEL (04/04/2008), DEL (03/09/2007)  Chol:177 (11/30/2008), 200 (04/04/2008)  Trig:219.0 (11/30/2008), 228 (04/04/2008)  Problem # 4:  DIVERTICULOSIS, COLON (ICD-562.10) reviewd symptoms Colonoscopy:  Labs Reviewed: Hgb: 15.2 (11/30/2008)   Hct: 44.0 (11/30/2008)   WBC: 10.9 (11/30/2008)  Complete Medication List: 1)  Promethazine Hcl 50 Mg Tabs (Promethazine hcl) .... Every 4 hours as needed 2)  Crestor 10 Mg Tabs (Rosuvastatin calcium) .... Take 1 tablet by mouth once a day 3)  Metamucil Caps (Psyllium caps) .... As needed 4)  Hydromorphone Hcl 4  Mg Tabs (Hydromorphone hcl) .... One by mouth q 6 hour prn 5)  Meclizine Hcl 12.5 Mg Tabs (Meclizine hcl) .... Two by mouth q 6 hour as needed 6)  Amitriptyline Hcl 25 Mg Tabs (Amitriptyline hcl) .... 5 pills each night 7)  Acidophilus Extra Strength Caps (Lactobacillus) .... Once daily 8)  Bl Stool Softener 100 Mg Caps (Docusate sodium) .... Qdhs 9)  Promethegan 25 Mg Supp (Promethazine hcl) .... One pr as needed nausia every 6 hours as needed 10)  Triamterene-hctz 37.5-25 Mg Tabs (Triamterene-hctz) .... One by mouth daily  replaces the hctz 11)  Flector 1.3 % Ptch (Diclofenac epolamine) .... Apply to shoulder  every 12 hours as needed 12)  Tramadol Hcl 50 Mg Tabs (Tramadol hcl) .... One by mouth q  4-6 hour as needed pain 13)  Nexium 40 Mg Cpdr (Esomeprazole magnesium) .... One by mouth daily 14)  Metoclopramide Hcl 5 Mg Tabs (Metoclopramide hcl) .... One by mouth q ac supper and bedtime 15)  Zithromax Z-pak 250 Mg Tabs (Azithromycin) .... As directed  Dyspepsia Assessment/Plan:  Step Therapy: GERD Treatment Protocols:    Step-1: improved  Patient Instructions: 1)  Please schedule a follow-up appointment in 3 months. Prescriptions: NEXIUM 40 MG CPDR (ESOMEPRAZOLE MAGNESIUM) one by mouth daily  #90 x 3   Entered by:   Willy Eddy, LPN   Authorized by:   Stacie Glaze MD   Signed by:   Willy Eddy, LPN on 56/43/3295   Method used:   Print then Give to Patient   RxID:   [id number] TRAMADOL HCL 50 MG TABS (TRAMADOL HCL) one by mouth q 4-6 hour as needed pain  #180 x 3   Entered by:   Willy Eddy, LPN   Authorized by:   Stacie Glaze MD   Signed by:   Willy Eddy, LPN on 93/23/5573   Method used:   Print then Give to Patient   RxID:   [id number] MECLIZINE HCL 12.5 MG TABS (MECLIZINE HCL) two by mouth q 6 hour as needed  #180 x 3   Entered by:   Willy Eddy, LPN   Authorized by:   Stacie Glaze MD   Signed by:   Willy Eddy, LPN on  31/51/7616   Method used:   Print then Give to Patient   RxID:   [id number] FLECTOR 1.3 %  PTCH (DICLOFENAC EPOLAMINE) APPLY TO SHOULDER  EVERY 12 HOURS AS NEEDED  #90 x 3   Entered by:   Willy Eddy, LPN   Authorized by:   Stacie Glaze MD   Signed by:   Willy Eddy, LPN on 70/35/0093   Method used:   Print then Give to Patient   RxID:   [id number] AMITRIPTYLINE HCL 25 MG  TABS (AMITRIPTYLINE HCL) 5 pills each night  #450 x 3   Entered by:   Willy Eddy, LPN   Authorized by:   Stacie Glaze MD   Signed by:   Willy Eddy, LPN on 81/06/7508   Method used:   Print then Give to Patient   RxID:   [id number] PROMETHAZINE HCL 50 MG  TABS (PROMETHAZINE HCL) Every 4 hours as needed  #180 x 3   Entered by:   Willy Eddy, LPN   Authorized by:   Stacie Glaze MD   Signed by:   Willy Eddy, LPN on 44/31/5400   Method used:   Print then Give to Patient   RxID:   [id number]

## 2010-07-30 NOTE — Letter (Signed)
Summary: Patient [facility] Biopsy Results  [facility]  [address] [place], [place] [address]   Phone: [phone]  Fax: [phone]        Nov 19, 2009 MRN: [id number]    [name] [address] [place], [place]  [address]    Dear Mr. Templin,  I am pleased to inform you that the biopsies taken during your recent endoscopic examination did not show any evidence of cancer upon pathologic examination.  Additional information/recommendations:  __No further action is needed at this time.  Please follow-up with      your primary care physician for your other healthcare needs.  __ Please call [phone] to schedule a return visit to review      your condition.  _X_ Continue with the treatment plan as outlined on the day of your      exam.NEW RX. TO BE CALLED...BIOPSY SHOWS CANDIDA INFECTION. __ You should have a repeat endoscopic examination for this problem              in _ months/years.   Please call us if you are having persistent problems or have questions about your condition that have not been fully answered at this time.  Sincerely,  Mardella Layman MD [facility]  This letter has been electronically signed by your physician.  Appended Document: Patient Notice-Endo Biopsy Results letter mailed
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
## 2010-07-30 NOTE — Procedures (Signed)
Summary: Upper Endoscopy  Patient: [name] Note: All result statuses are Final unless otherwise noted.  Tests: (1) Upper Endoscopy (EGD)   EGD Upper Endoscopy       DONE     [facility]     [address]     [place], [place]  [address]           ENDOSCOPY PROCEDURE REPORT           PATIENT:  [name], [name]  MR#:  [id number]     BIRTHDATE:  [birth year], 63 yrs. old  GENDER:  male           ENDOSCOPIST:  Vania Rea. Jarold Motto, MD, [facility]     Referred by:           PROCEDURE DATE:  11/14/2009     PROCEDURE:  EGD with biopsy, Elease Hashimoto Dilation of Esophagus     ASA CLASS:  Class II     INDICATIONS:  dysphagia, GERD           MEDICATIONS:   There was residual sedation effect present from     prior procedure., Fentanyl 25 mcg IV, Versed 3 mg IV     TOPICAL ANESTHETIC:           DESCRIPTION OF PROCEDURE:   After the risks benefits and     alternatives of the procedure were thoroughly explained, informed     consent was obtained.  The LB GIF-H180 [id number] endoscope was     introduced through the mouth and advanced to the second portion of     the duodenum, without limitations.  The instrument was slowly     withdrawn as the mucosa was fully examined.     <<PROCEDUREIMAGES>>           A hiatal hernia was found. PROLAPSING 3-4 CM. HH NOTED.EROSIONS IN     UPPER GASTRIC AREA.SEE PICTURES.  Esophagitis was found. EXUDATE     BIOPSIED AT GE JUNCTION WITH CIRUMFERENTIAL STRICTURE.THEN DILATED     # 35F MALONEY DILATOR.TOLERATED WELL.  The stomach was entered and     closely examined. The antrum, angularis, and lesser curvature were     well visualized, including a retroflexed view of the cardia and     fundus. The stomach wall was normally distensable. The scope     passed easily through the pylorus into the duodenum. CLO BX. OF     ANTRUM DONE.    Retroflexed views revealed a hiatal hernia.    The     scope was then withdrawn from the patient and the procedure     completed.        COMPLICATIONS:  None           ENDOSCOPIC IMPRESSION:     1) Hiatal hernia     2) Esophagitis     3) Normal stomach     1.EROSIVE ESOPHAGITIS.R/O CANDIDA.     2.HIATIAL HERNIA AND CHRONIC GERD     3.R/O H.PYLORI     4.ESOPHAGEAL STRICTURE BIOPSIED AND DILATED.     RECOMMENDATIONS:     1) Await biopsy results     2) Clear liquids until, then soft foods rest iof day. Resume     prior diet tomorrow.     3) Rx CLO if positive     4) continue current medications           REPEAT EXAM:  No  ______________________________     Vania Rea. Jarold Motto, MD, [facility]           CC:  Stacie Glaze, MD           n.     Rosalie DoctorMarland Kitchen   Vania Rea. Jehu Mccauslin at 11/14/2009 04:03 PM           Olene Floss, [id number]  Note: An exclamation mark (!) indicates a result that was not dispersed into the flowsheet. Document Creation Date: 11/15/2009 5:07 PM _______________________________________________________________________  (1) Order result status: Final Collection or observation date-time: 11/14/2009 15:54 Requested date-time:  Receipt date-time:  Reported date-time:  Referring Physician:   Ordering Physician: Sheryn Bison [phone]) Specimen Source:  Source: Launa Grill Order Number: [id number] Lab site:

## 2010-07-30 NOTE — Miscellaneous (Signed)
Summary: Clotest  Clinical Lists Changes  Orders: Added new Test order of TLB-H Pylori Screen Gastric Biopsy (83013-CLOTEST) - Signed 

## 2010-07-30 NOTE — Letter (Signed)
Summary: Patient Notice- Polyp Results  [facility]  [address] [place], [place] [address]   Phone: [phone]  Fax: [phone]        Nov 19, 2009 MRN: [id number]    [facility] [address] [place], [place]  [address]    Dear Mr. Pella,  I am pleased to inform you that the colon polyp(s) removed during your recent colonoscopy was (were) found to be benign (no cancer detected) upon pathologic examination.  I recommend you have a repeat colonoscopy examination in 10_ years to look for recurrent polyps, as having colon polyps increases your risk for having recurrent polyps or even colon cancer in the future.  Should you develop new or worsening symptoms of abdominal pain, bowel habit changes or bleeding from the rectum or bowels, please schedule an evaluation with either your primary care physician or with me.  Additional information/recommendations:  _x_ No further action with gastroenterology is needed at this time. Please      follow-up with your primary care physician for your other healthcare      needs.  __ Please call [phone] to schedule a return visit to review your      situation.  __ Please keep your follow-up visit as already scheduled.  __ Continue treatment plan as outlined the day of your exam.  Please call us if you are having persistent problems or have questions about your condition that have not been fully answered at this time.  Sincerely,  Mardella Layman MD [facility]  This letter has been electronically signed by your physician.  Appended Document: Patient Notice- Polyp Results letter mailed

## 2010-07-30 NOTE — Letter (Signed)
Summary: [facility] Instructions  [facility]  [address] [place], [place] [address]   Phone: [phone]  Fax: [phone]       KEYONTAY STOLZ    1945-08-09    MRN: [id number]        Procedure Day Dorna Bloom: Wednesday, 11/14/09     Arrival Time: 2:00     Procedure Time: 3:00     Location of Procedure:                    _X _  [facility] (4th Floor) _   PREPARATION FOR COLONOSCOPY WITH MOVIPREP   Starting 5 days prior to your procedure 11/09/09 do not eat nuts, seeds, popcorn, corn, beans, peas,  salads, or any raw vegetables.  Do not take any fiber supplements (e.g. Metamucil, Citrucel, and Benefiber).  THE DAY BEFORE YOUR PROCEDURE         DATE: 11/13/09   DAY: Tuesday  1.  Drink clear liquids the entire day-NO SOLID FOOD  2.  Do not drink anything colored red or purple.  Avoid juices with pulp.  No orange juice.  3.  Drink at least 64 oz. (8 glasses) of fluid/clear liquids during the day to prevent dehydration and help the prep work efficiently.  CLEAR LIQUIDS INCLUDE: Water Jello Ice Popsicles Tea (sugar ok, no milk/cream) Powdered fruit flavored drinks Coffee (sugar ok, no milk/cream) Gatorade Juice: apple, white grape, white cranberry  Lemonade Clear bullion, consomm, broth Carbonated beverages (any kind) Strained chicken noodle soup Hard Candy                             4.  In the morning, mix first dose of MoviPrep solution:    Empty 1 Pouch A and 1 Pouch B into the disposable container    Add lukewarm drinking water to the top line of the container. Mix to dissolve    Refrigerate (mixed solution should be used within 24 hrs)  5.  Begin drinking the prep at 5:00 p.m. The MoviPrep container is divided by 4 marks.   Every 15 minutes drink the solution down to the next mark (approximately 8 oz) until the full liter is complete.   6.  Follow completed prep with 16 oz of clear liquid of your choice (Nothing red or purple).   Continue to drink clear liquids until bedtime.  7.  Before going to bed, mix second dose of MoviPrep solution:    Empty 1 Pouch A and 1 Pouch B into the disposable container    Add lukewarm drinking water to the top line of the container. Mix to dissolve    Refrigerate  THE DAY OF YOUR PROCEDURE      DATE: 11/14/09   DAY: Wednesday  Beginning at 10:00a.m. (5 hours before procedure):         1. Every 15 minutes, drink the solution down to the next mark (approx 8 oz) until the full liter is complete.  2. Follow completed prep with 16 oz. of clear liquid of your choice.    3. You may drink clear liquids until 1:00 (2 HOURS BEFORE PROCEDURE).   MEDICATION INSTRUCTIONS  Unless otherwise instructed, you should take regular prescription medications with a small sip of water   as early as possible the morning of your procedure.                   OTHER  INSTRUCTIONS  You will need a responsible adult at least 64 years of age to accompany you and drive you home.   This person must remain in the waiting room during your procedure.  Wear loose fitting clothing that is easily removed.  Leave jewelry and other valuables at home.  However, you may wish to bring a book to read or  an iPod/MP3 player to listen to music as you wait for your procedure to start.  Remove all body piercing jewelry and leave at home.  Total time from sign-in until discharge is approximately 2-3 hours.  You should go home directly after your procedure and rest.  You can resume normal activities the  day after your procedure.  The day of your procedure you should not:   Drive   Make legal decisions   Operate machinery   Drink alcohol   Return to work  You will receive specific instructions about eating, activities and medications before you leave.    The above instructions have been reviewed and explained to me by   _______________________    I fully understand and can verbalize these  instructions _____________________________ Date _________

## 2010-07-30 NOTE — Progress Notes (Signed)
  Phone Note Other Incoming   Request: Send information Summary of Call: Request for records received from [facility]. Request forwarded to [facility].     

## 2010-07-30 NOTE — Assessment & Plan Note (Signed)
Summary: rov/needs to discuss shingles shot/dm   Vital Signs:  Patient profile:   65 year old male Height:      68 inches Weight:      216 pounds BMI:     32.96 Temp:     98.2 degrees F oral Pulse rate:   80 / minute Resp:     14 per minute BP sitting:   140 / 80  (left arm)  Vitals Entered By: Willy Eddy, LPN (July 04, 2009 10:51 AM) CC: roa, Hypertension Management, Headache   Primary Care Provider:  Stacie Glaze MD  CC:  roa, Hypertension Management, and Headache.  History of Present Illness: talk about the shingle shots had viral infection that was shingles and involved his eyes  and is on valtrex for six months discussion of shot after he is off the valtrex GERD is worse and the acifex helps best but is not covered by insurance currently He aso was diagnosed with retinal tair  and visual loss diagnosed he is follow by the optamology at [place] ( [facility]) The motion sickness is stable but not improved has has recurrent sinus infections   Headache HPI:      The patient comes in for chronic management of stable headaches.        Positive alarm features include change in severity from prior H/A's.         Hypertension History:      He denies headache, chest pain, palpitations, dyspnea with exertion, orthopnea, PND, peripheral edema, visual symptoms, neurologic problems, syncope, and side effects from treatment.        Positive major cardiovascular risk factors include male age 62 years old or older, hyperlipidemia, and hypertension.  Negative major cardiovascular risk factors include non-tobacco-user status.      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) 6)  Open  Wound of Wrist, Complicated  (ICD-881.12) 7)  Shoulder Pain, Bilateral  (ICD-719.41) 8)  Advef, Drug/medicinal/biological Subst Nos  (ICD-995.20) 9)  Dizziness, Chronic  (ICD-780.4) 10)  Diverticulosis, Colon  (ICD-562.10) 11)  Family History of Cad Male 1st Degree Relative <50  (ICD-V17.3) 12)  Hypertension  (ICD-401.9) 13)  Osteoarthritis  (ICD-715.90) 14)  Hyperlipidemia  (ICD-272.4) 15)  Gerd  (ICD-530.81)  Medications Prior to Update: 1)  Promethazine Hcl 50 Mg  Tabs (Promethazine Hcl) .... Every 4 Hours As Needed 2)  Crestor 10 Mg  Tabs (Rosuvastatin Calcium) .... Take 1 Tablet By Mouth Once A Day 3)  Metamucil   Caps (Psyllium Caps) .... As Needed 4)  Hydromorphone Hcl 4 Mg  Tabs (Hydromorphone Hcl) .... One By Mouth Q 6 Hour Prn 5)  Meclizine Hcl 12.5 Mg Tabs (Meclizine Hcl) .... Two By Mouth Q 6 Hour As Needed 6)  Amitriptyline Hcl 25 Mg  Tabs (Amitriptyline Hcl) .... 5 Pills Each Night 7)  Acidophilus Extra Strength   Caps (Lactobacillus) .... Once Daily 8)  Bl Stool Softener 100 Mg  Caps (Docusate Sodium) .... Qdhs 9)  Promethegan 25 Mg  Supp (Promethazine Hcl) .... One Pr As Needed Nausia Every 6 Hours As Needed 10)  Triamterene-Hctz 37.5-25 Mg Tabs (Triamterene-Hctz) .... One By Mouth Daily  Replaces The Hctz 11)  Flector 1.3 %  Ptch (Diclofenac Epolamine) .... Apply To Shoulder  Every 12 Hours As Needed 12)  Tramadol Hcl 50 Mg Tabs (Tramadol Hcl) .... One By Mouth Q 4-6 Hour As Needed Pain 13)  Nexium 40 Mg Cpdr (Esomeprazole Magnesium) .... One By Mouth Daily 14)  Metoclopramide Hcl 5 Mg Tabs (Metoclopramide Hcl) .... One By Mouth Q Ac Supper and Bedtime 15)  Zithromax Z-Pak 250 Mg Tabs (Azithromycin) .... As Directed  Current Medications (verified): 1)  Promethazine Hcl 50 Mg  Tabs (Promethazine Hcl) .... Every 4 Hours As Needed 2)  Crestor 10 Mg  Tabs (Rosuvastatin Calcium) .... Take 1 Tablet By Mouth Once A Day 3)  Metamucil   Caps (Psyllium Caps) .... As Needed 4)   Hydromorphone Hcl 4 Mg  Tabs (Hydromorphone Hcl) .... One By Mouth Q 6 Hour Prn 5)  Meclizine Hcl 12.5 Mg Tabs (Meclizine Hcl) .... Two By Mouth Q 6 Hour As Needed 6)  Amitriptyline Hcl 25 Mg  Tabs (Amitriptyline Hcl) .... 5 Pills Each Night 7)  Acidophilus Extra Strength   Caps (Lactobacillus) .... Once Daily 8)  Bl Stool Softener 100 Mg  Caps (Docusate Sodium) .... Qdhs 9)  Promethegan 25 Mg  Supp (Promethazine Hcl) .... One Pr As Needed Nausia Every 6 Hours As Needed 10)  Triamterene-Hctz 37.5-25 Mg Tabs (Triamterene-Hctz) .... One By Mouth Daily  Replaces The Hctz 11)  Flector 1.3 %  Ptch (Diclofenac Epolamine) .... Apply To Shoulder  Every 12 Hours As Needed 12)  Tramadol Hcl 50 Mg Tabs (Tramadol Hcl) .... One By Mouth Q 4-6 Hour As Needed Pain 13)  Nexium 40 Mg Cpdr (Esomeprazole Magnesium) .... One By Mouth Daily 14)  Metoclopramide Hcl 5 Mg Tabs (Metoclopramide Hcl) .... One By Mouth Q Ac Supper and Bedtime 15)  Zithromax Z-Pak 250 Mg Tabs (Azithromycin) .... As Directed  Allergies (verified): 1)  ! Codeine  Past History:  Family History: Last updated: 04/26/2009 Father suicide Family History of CAD Male 1st degree relative <50 Mother died from  Family History Kidney disease His brother had an MI at age 52  Social History: Last updated: 04/26/2009 Married Alcohol use-no Nonsmoker He used to work at [employer] but has been disabled for the past 5 years.  Risk Factors: Smoking Status: never (07/04/2009)  Past medical, surgical, family and social histories (including risk factors) reviewed, and no changes noted (except as noted below).  Past Medical History: Reviewed history from 03/09/2007 and no changes required. GERD Hyperlipidemia DJD-knee Chronic Vertigo Benign Brain lesion Osteoarthritis Hypertension Diverticulosis, colon  Past Surgical History: Reviewed history from 12/24/2006 and no changes required. 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 Father suicide Family History of CAD Male 1st degree relative <50 Mother died from  Family History Kidney disease His brother had an MI at age 83  Social History: Reviewed history from 04/26/2009 and no changes required. Married Alcohol use-no Nonsmoker He used to work at [employer] but has been disabled for the past 5 years.Smoking Status:  never  Review of Systems  The patient denies anorexia, fever, weight loss, weight gain, vision loss, decreased hearing, hoarseness, chest pain, syncope, dyspnea on exertion, peripheral edema, prolonged cough, headaches, hemoptysis, abdominal pain, melena, hematochezia, severe indigestion/heartburn, hematuria, incontinence, genital sores, muscle weakness, suspicious skin lesions, transient blindness, difficulty walking, depression, unusual weight change, abnormal bleeding, enlarged lymph nodes, angioedema, and breast masses.    Physical Exam  General:  Well-developed,well-nourished,in  no acute distress; alert,appropriate and cooperative throughout examination Head:  Normocephalic and atraumatic without obvious abnormalities. No apparent alopecia or balding. Eyes:  increased nystagmus.   Ears:  R ear normal and L ear normal.   Nose:  nasal dischargemucosal pallor.   Mouth:  tonsil hypertropied, posterior lymphoid hypertrophy, and postnasal drip.   Neck:  No deformities, masses, or tenderness noted. Lungs:  Normal respiratory effort, chest expands symmetrically. Lungs are clear to auscultation, no crackles or wheezes. Heart:  Normal rate and regular rhythm. S1 and S2 normal without gallop, murmur, click, rub or other extra sounds. Abdomen:  soft and nontender. Msk:  normal ROM, no joint swelling, and no joint warmth.   Pulses:  R and L carotid,radial,femoral,dorsalis pedis and posterior tibial pulses are full and equal  bilaterally Extremities:  No clubbing, cyanosis, edema, or deformity noted with normal full range of motion of all joints.   Neurologic:  alert & oriented X3 and gait normal.     Impression & Recommendations:  Problem # 1:  GERD (ICD-530.81) Assessment Deteriorated trial of aciphex His updated medication list for this problem includes:    Nexium 40 Mg Cpdr (Esomeprazole magnesium) ..... One by mouth daily  EGD: Findings: Esophagitis  Findings: Gastritis   (07/22/2006)  Labs Reviewed: Hgb: 15.2 (11/30/2008)   Hct: 44.0 (11/30/2008)  Problem # 2:  HEADACHE, SINUS (ICD-784.0) Assessment: Deteriorated  His updated medication list for this problem includes:    Hydromorphone Hcl 4 Mg Tabs (Hydromorphone hcl) ..... One by mouth q 6 hour prn    Tramadol Hcl 50 Mg Tabs (Tramadol hcl) ..... One by mouth q 4-6 hour as needed pain  Headache diary reviewed.  Problem # 3:  HYPERTENSION (ICD-401.9)  His updated medication list for this problem includes:    Triamterene-hctz 37.5-25 Mg Tabs (Triamterene-hctz) ..... One by mouth daily  replaces the hctz  BP today: 140/80 Prior BP: 138/82 (04/26/2009)  Prior 10 Yr Risk Heart Disease: Not enough information (10/03/2008)  Labs Reviewed: K+: 4.4 (04/06/2009) Creat: : 1.1 (04/06/2009)   Chol: 177 (11/30/2008)   HDL: 39.40 (11/30/2008)   LDL: DEL (04/04/2008)   TG: 219.0 (11/30/2008)  Complete Medication List: 1)  Promethazine Hcl 50 Mg Tabs (Promethazine hcl) .... Every 4 hours as needed 2)  Crestor 10 Mg Tabs (Rosuvastatin calcium) .... Take 1 tablet by mouth once a day 3)  Metamucil Caps (Psyllium caps) .... As needed 4)  Hydromorphone Hcl 4 Mg Tabs (Hydromorphone hcl) .... One by mouth q 6 hour prn 5)  Meclizine Hcl 12.5 Mg Tabs (Meclizine hcl) .... Two by mouth q 6 hour as needed 6)  Amitriptyline Hcl 25 Mg Tabs (Amitriptyline hcl) .... 5 pills each night 7)  Acidophilus Extra Strength Caps (Lactobacillus) .... Once daily 8)  Bl  Stool Softener 100 Mg Caps (Docusate sodium) .... Qdhs 9)  Promethegan 25 Mg Supp (Promethazine hcl) .... One pr as needed nausia every 6 hours as needed 10)  Triamterene-hctz 37.5-25 Mg Tabs (Triamterene-hctz) .... One by mouth daily  replaces the hctz 11)  Flector 1.3 % Ptch (Diclofenac epolamine) .... Apply to shoulder  every 12 hours as needed 12)  Tramadol Hcl 50 Mg Tabs (Tramadol hcl) .... One by mouth q 4-6 hour as needed pain 13)  Nexium 40 Mg Cpdr (Esomeprazole magnesium) .... One by mouth daily 14)  Metoclopramide Hcl 5 Mg Tabs (Metoclopramide hcl) .... One by mouth q ac supper and bedtime 15)  Zithromax Z-pak 250 Mg Tabs (Azithromycin) .Marland KitchenMarland KitchenMarland Kitchen  As directed  Hypertension Assessment/Plan:      The patient's hypertensive risk group is category B: At least one risk factor (excluding diabetes) with no target organ damage.  Today's blood pressure is 140/80.  His blood pressure goal is < 140/90.  Patient Instructions: 1)  Please schedule a follow-up appointment in 3 months.

## 2010-07-30 NOTE — Assessment & Plan Note (Signed)
Summary: GERD/YF  Medications Added MOVIPREP 100 GM  SOLR (PEG-KCL-NACL-NASULF-NA ASC-C) As per prep instructions.      Allergies Added:   History of Present Illness Visit Type: Initial Consult Primary GI MD: Sheryn Bison MD FACP FAGA Primary Provider: Stacie Glaze MD Requesting Provider: Stacie Glaze MD Chief Complaint: GERD, Bloatin, Gas, indigestion History of Present Illness:   65 year old Caucasian male with chronic IBS, gas, bloating, better regularity, and chronic GERD despite being treated with daily PPI therapy. He is followed closely by Dr. Darryll Capers in primary care who recently changed him to AcipHex 20 mg a day with some improvement in his GERD symptomatology.  He complains of progressive solid food dysphagia in the distal substernal area with some painful swallowing. Last endoscopy and colonoscopy were 3-4 years ago. He continues with rather severe constipation, gas and bloating. He one point was on metoclopramide but does not take that currently. He is n-probiotic therapy and  is on a stool softener and fiber supplement. He denies melena hematochezia, anorexia, weight loss, or any specific hepatobiliary complaints.   GI Review of Systems    Reports abdominal pain, acid reflux, belching, bloating, dysphagia with solids, nausea, and  vomiting.      Denies chest pain, dysphagia with liquids, heartburn, loss of appetite, vomiting blood, weight loss, and  weight gain.      Reports change in bowel habits, constipation, diverticulosis, and  irritable bowel syndrome.     Denies anal fissure, black tarry stools, diarrhea, fecal incontinence, heme positive stool, hemorrhoids, jaundice, light color stool, liver problems, rectal bleeding, and  rectal pain.    Current Medications (verified): 1)  Promethazine Hcl 50 Mg  Tabs (Promethazine Hcl) .... Every 4 Hours As Needed 2)  Crestor 10 Mg  Tabs (Rosuvastatin Calcium) .... Take 1 Tablet By Mouth Once A Day 3)  Metamucil    Caps (Psyllium Caps) .... As Needed 4)  Hydromorphone Hcl 4 Mg  Tabs (Hydromorphone Hcl) .... One By Mouth Q 6 Hour Prn 5)  Meclizine Hcl 12.5 Mg Tabs (Meclizine Hcl) .... Two By Mouth Q 6 Hour As Needed 6)  Amitriptyline Hcl 25 Mg  Tabs (Amitriptyline Hcl) .... 5 Pills Each Night 7)  Acidophilus Extra Strength   Caps (Lactobacillus) .... Once Daily 8)  Bl Stool Softener 100 Mg  Caps (Docusate Sodium) .... Qdhs 9)  Promethegan 25 Mg  Supp (Promethazine Hcl) .... One Pr As Needed Nausia Every 6 Hours As Needed 10)  Triamterene-Hctz 37.5-25 Mg Tabs (Triamterene-Hctz) .... One By Mouth Daily  Replaces The Hctz 11)  Flector 1.3 %  Ptch (Diclofenac Epolamine) .... Apply To Shoulder  Every 12 Hours As Needed 12)  Tramadol Hcl 50 Mg Tabs (Tramadol Hcl) .... One By Mouth Q 4-6 Hour As Needed Pain 13)  Nexium 40 Mg Cpdr (Esomeprazole Magnesium) .... One By Mouth Daily 14)  Metoclopramide Hcl 5 Mg Tabs (Metoclopramide Hcl) .... One By Mouth Q Ac Supper and Bedtime 15)  Zithromax Z-Pak 250 Mg Tabs (Azithromycin) .... As Directed  Allergies (verified): 1)  ! Codeine  Past History:  Past medical, surgical, family and social histories (including risk factors) reviewed for relevance to current acute and chronic problems.  Past Medical History: GERD Hyperlipidemia DJD-knee Chronic Vertigo Benign Brain lesion Osteoarthritis Hypertension Diverticulosis, colon Arthritis hx of colon polyps Irritable Bowel Syndrome ulcers in the GI tract  Past Surgical History: Reviewed history from 12/24/2006 and no changes required. 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 Father suicide Family History of CAD Male 1st degree relative <50 Mother died from  Family History Kidney disease His brother had an MI at age 42  Social History: Reviewed history from 04/26/2009 and no changes  required. Married Alcohol use-no Nonsmoker He used to work at [employer] but has been disabled for the past 5 years. Occupation: retired Daily Caffeine Use  Review of Systems       The patient complains of allergy/sinus, arthritis/joint pain, back pain, headaches-new, night sweats, and urination - excessive.  The patient denies anemia, anxiety-new, blood in urine, breast changes/lumps, change in vision, confusion, cough, coughing up blood, depression-new, fainting, fatigue, fever, hearing problems, heart murmur, heart rhythm changes, itching, muscle pains/cramps, nosebleeds, shortness of breath, skin rash, sleeping problems, sore throat, swelling of feet/legs, swollen lymph glands, thirst - excessive, urination changes/pain, urine leakage, vision changes, and voice change.    Vital Signs:  Patient profile:   65 year old male Height:      68 inches Weight:      218 pounds BMI:     33.27 BSA:     2.12 Pulse rate:   84 / minute Pulse rhythm:   regular BP sitting:   122 / 78  (left arm)  Vitals Entered By: Merri Ray CMA Duncan Dull) (Nov 06, 2009 2:57 PM)  Physical Exam  General:  Well developed, well nourished, no acute distress.healthy appearing.   Head:  Normocephalic and atraumatic. Eyes:  PERRLA, no icterus.exam deferred to patient's ophthalmologist.   Lungs:  Clear throughout to auscultation. Heart:  Regular rate and rhythm; no murmurs, rubs,  or bruits. Abdomen:  Soft, nontender and nondistended. No masses, hepatosplenomegaly or hernias noted. Normal bowel sounds. Rectal:  deferred until time of colonoscopy.   Msk:  Symmetrical with no gross deformities. Normal posture. Extremities:  No clubbing, cyanosis, edema or deformities noted. Neurologic:  Alert and  oriented x4;  grossly normal neurologically. Cervical Nodes:  No significant cervical adenopathy. Psych:  Alert and cooperative. Normal mood and affect.   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 Labs are been ordered and repeat endoscopy. I've given him samples of AcipHex for continued use and reviewed a reflux regime. Orders: TLB-CBC Platelet - w/Differential (85025-CBCD) TLB-BMP (Basic Metabolic Panel-BMET) (80048-METABOL) TLB-Hepatic/Liver Function Pnl (80076-HEPATIC) TLB-TSH (Thyroid Stimulating Hormone) (84443-TSH) TLB-B12, Serum-Total ONLY (60454-U98) TLB-Ferritin (82728-FER) TLB-Folic Acid (Folate) (82746-FOL) TLB-IBC Pnl (Iron/FE;Transferrin) (83550-IBC) TLB-Magnesium (Mg) (83735-MG) TLB-PSA (Prostate Specific Antigen) (84153-PSA) TLB-Sedimentation Rate (ESR) (85652-ESR) TLB-IgA (Immunoglobulin A) (82784-IGA) T-Sprue Panel (Celiac Disease Aby Eval) (83516x3/[address])  Problem # 2:  OTHER CONSTIPATION (ICD-564.09) Assessment: Deteriorated Trial of [facility] Colon Health b.i.d., continue fiber supplements and liberal p.o. fluids as tolerated. Followup colonoscopy also has been scheduled at his convenience. Orders: TLB-CBC Platelet - w/Differential (85025-CBCD) TLB-BMP (Basic Metabolic Panel-BMET) (80048-METABOL) TLB-Hepatic/Liver Function Pnl (80076-HEPATIC) TLB-TSH (Thyroid Stimulating Hormone) (84443-TSH) TLB-B12, Serum-Total ONLY (11914-N82) TLB-Ferritin (82728-FER) TLB-Folic Acid (Folate) (82746-FOL) TLB-IBC Pnl (Iron/FE;Transferrin) (83550-IBC) TLB-Magnesium (Mg) (83735-MG) TLB-PSA (Prostate Specific Antigen) (84153-PSA) TLB-Sedimentation Rate (ESR) (85652-ESR) TLB-IgA (Immunoglobulin A) (82784-IGA) T-Sprue Panel (Celiac Disease Aby Eval) (83516x3/[address])  Problem # 3:  PROSTATITIS, RECURRENT (ICD-601.9) Assessment: Deteriorated He complains of difficulty urinating and nocturnal urination. Will send this note to Dr. Lovell Sheehan and suggest urologic consultation.  Patient Instructions: 1)   Please go to the basement for lab  work. 2)  You are scheduled for an endoscopy an a colonoscopy. 3)  Begin [facility] Colon health two times a day.  Buy this OTC. 4)  Stop Nexium and start Aciphex daily. 5)  Begin Librax as needed. 6)  The medication list was reviewed and reconciled.  All changed / newly prescribed medications were explained.  A complete medication list was provided to the patient / caregiver. 7)  Copy sent to : Dr. Darryll Capers and primary care 8)  Please continue current medications.  9)  Avoid foods high in acid content ( tomatoes, citrus juices, spicy foods) . Avoid eating within 3 to 4 hours of lying down or before exercising. Do not over eat; try smaller more frequent meals. Elevate head of bed four inches when sleeping.  10)  Constipation and Hemorrhoids brochure given.  11)  Colonoscopy and Flexible Sigmoidoscopy brochure given.  12)  Conscious Sedation brochure given.  13)  Upper Endoscopy with Dilatation brochure given.  Prescriptions: MOVIPREP 100 GM  SOLR (PEG-KCL-NACL-NASULF-NA ASC-C) As per prep instructions.  #1 x 0   Entered by:   Ashok Cordia RN   Authorized by:   Mardella Layman MD [facility]   Signed by:   Ashok Cordia RN on 11/06/2009   Method used:   Print then Give to Patient   RxID:   [id number]   Appended Document: GERD/YF    Clinical Lists Changes  Medications: Added new medication of CLIDINIUM-CHLORDIAZEPOXIDE 2.5-5 MG CAPS (CLIDINIUM-CHLORDIAZEPOXIDE) 1 by mouth three times a day as needed - Signed Added new medication of [facility] COLON HEALTH  CAPS (PROBIOTIC PRODUCT) two times a day Added new medication of ACIPHEX 20 MG  TBEC (RABEPRAZOLE SODIUM) Take 1 each day 30 minutes before meals Rx of CLIDINIUM-CHLORDIAZEPOXIDE 2.5-5 MG CAPS (CLIDINIUM-CHLORDIAZEPOXIDE) 1 by mouth three times a day as needed;  #60 x 3;  Signed;  Entered by: Ashok Cordia RN;  Authorized by: Mardella Layman MD [facility];  Method used: Faxed to [facility], [phone], [place], [place], [place]  [address], Ph: [phone] or [phone], Fax: [phone] Orders: Added new Test order of Colon/Endo (Colon/Endo) - Signed    Prescriptions: CLIDINIUM-CHLORDIAZEPOXIDE 2.5-5 MG CAPS (CLIDINIUM-CHLORDIAZEPOXIDE) 1 by mouth three times a day as needed  #60 x 3   Entered by:   Ashok Cordia RN   Authorized by:   Mardella Layman MD [facility]   Signed by:   Ashok Cordia RN on 11/06/2009   Method used:   Faxed to ...       Hospital doctor (retail)       [address]       [place], [place]  [address]       Ph: [phone] or [phone]       Fax: [phone]   RxID:   [id number]

## 2010-07-30 NOTE — Assessment & Plan Note (Signed)
Summary: reaccess blood pressure/per Dr. Charlton Amor   Vital Signs:  Patient profile:   65 year old male Height:      68 inches Weight:      212 pounds BMI:     32.35 Temp:     98.2 degrees F oral Pulse rate:   76 / minute Resp:     14 per minute BP sitting:   164 / 100  (left arm)  Vitals Entered By: Willy Eddy, LPN (March 25, 2010 12:26 PM)  Nutrition Counseling: Patient's BMI is greater than 25 and therefore counseled on weight management options. CC: c/io left shoulder and neck pain- saw dr Caryl Never about 1 month ago and was given injection with little relief--also elevated bp, Hypertension Management Is Patient Diabetic? No   Primary Care Provider:  Stacie Glaze MD  CC:  c/io left shoulder and neck pain- saw dr Caryl Never about 1 month ago and was given injection with little relief--also elevated bp and Hypertension Management.  History of Present Illness: follow up of chronic vertigo and nausea with acute cpmlaints of shoulder pain left shoudler pain off and on for several months. He saw Dr Caryl Never and was given an injection in his shoulder which only temporarily releved his pain the pts blood pressure was noted to be elevated at that visit he denies chest pressure  or pain  Hypertension History:      He denies headache, chest pain, palpitations, dyspnea with exertion, orthopnea, PND, peripheral edema, visual symptoms, neurologic problems, syncope, and side effects from treatment.        Positive major cardiovascular risk factors include male age 40 years old or older, hyperlipidemia, and hypertension.  Negative major cardiovascular risk factors include non-tobacco-user status.     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) 8)  Open Wound of Wrist, Complicated  (ICD-881.12) 9)  Shoulder Pain, Bilateral  (ICD-719.41) 10)  Advef, Drug/medicinal/biological Subst Nos  (ICD-995.20) 11)  Dizziness, Chronic  (ICD-780.4) 12)  Diverticulosis, Colon  (ICD-562.10) 13)  Family History of Cad Male 1st Degree Relative <50  (ICD-V17.3) 14)  Hypertension  (ICD-401.9) 15)  Osteoarthritis  (ICD-715.90) 16)  Hyperlipidemia  (ICD-272.4) 17)  Gerd  (ICD-530.81)  Current Problems (verified): 1)  Back Pain, Upper  (ICD-724.5) 2)  Other Constipation  (ICD-564.09) 3)  Chest Pain Unspecified  (ICD-786.50) 4)  Transient Ischemic Attacks, Hx of  (ICD-V12.50) 5)  Headache, Sinus  (ICD-784.0) 6)  Physical Examination  (ICD-V70.0) 7)  Prostatitis, 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 Every 4 Hours As Needed 2)  Crestor 10 Mg  Tabs (Rosuvastatin Calcium) .... Take  1 Tablet By Mouth Once A Day 3)  Metamucil   Caps (Psyllium Caps) .... As Needed 4)  Hydromorphone Hcl 4 Mg  Tabs (Hydromorphone Hcl) .... One By Mouth Q 6 Hour Prn 5)  Meclizine Hcl 12.5 Mg Tabs (Meclizine Hcl) .... Two By Mouth Q 6 Hour As Needed 6)  Amitriptyline Hcl 25 Mg  Tabs (Amitriptyline Hcl) .... 5 Pills Each Night 7)  Acidophilus Extra Strength   Caps (Lactobacillus) .... Once Daily 8)  Bl  Stool Softener 100 Mg  Caps (Docusate Sodium) .... Qdhs 9)  Promethegan 25 Mg  Supp (Promethazine Hcl) .... One Pr As Needed Nausia Every 6 Hours As Needed 10)  Triamterene-Hctz 37.5-25 Mg Tabs (Triamterene-Hctz) .... One By Mouth Daily  Replaces The Hctz 11)  Flector 1.3 %  Ptch (Diclofenac Epolamine) .... Apply To Shoulder  Every 12 Hours As Needed 12)  Tramadol Hcl 50 Mg Tabs (Tramadol Hcl) .... One By Mouth Q 4-6 Hour As Needed Pain 13)  Nexium 40 Mg Cpdr (Esomeprazole Magnesium) .... One By Mouth Daily 14)  Metoclopramide Hcl 5 Mg Tabs (Metoclopramide Hcl) .... One By Mouth Q Ac Supper and Bedtime 15)  Zithromax Z-Pak 250 Mg Tabs (Azithromycin) .... As Directed 16)  Clidinium-Chlordiazepoxide 2.5-5 Mg Caps (Clidinium-Chlordiazepoxide) .Marland Kitchen.. 1 By Mouth Three Times A Day As Needed 17)  Phillips Colon Health  Caps (Probiotic Product) .... Two Times A Day 18)  Aciphex 20 Mg  Tbec (Rabeprazole Sodium) .... Take 1 Each Day 30 Minutes Before Meals 19)  Diflucan 100 Mg Tabs (Fluconazole) .Marland Kitchen.. 1 Tablet Twice A Day X1 Day, Then 1tablet Daily For 2 Weeks 20)  Cyclobenzaprine Hcl 5 Mg Tabs (Cyclobenzaprine Hcl) .... One By Mouth Q 8 Hours As Needed Muscle Spasm  Current Medications (verified): 1)  Promethazine Hcl 50 Mg  Tabs (Promethazine Hcl) .... Every 4 Hours As Needed 2)  Crestor 10 Mg  Tabs (Rosuvastatin Calcium) .... Take 1 Tablet By Mouth Once A Day 3)  Metamucil   Caps (Psyllium Caps) .... As Needed 4)  Hydromorphone Hcl 4 Mg  Tabs (Hydromorphone Hcl) .... One By Mouth Q 6 Hour Prn 5)  Meclizine Hcl 12.5 Mg Tabs (Meclizine Hcl) .... Two By Mouth Q 6 Hour As Needed 6)  Amitriptyline Hcl 25 Mg  Tabs (Amitriptyline Hcl) .... 5 Pills Each Night 7)  Acidophilus Extra Strength   Caps (Lactobacillus) .... Once Daily 8)  Bl Stool Softener 100 Mg  Caps (Docusate Sodium) .... Qdhs 9)  Promethegan 25 Mg  Supp (Promethazine Hcl) .... One Pr As Needed Nausia Every 6 Hours As Needed 10)  Benicar Hct  20-12.5 Mg Tabs (Olmesartan Medoxomil-Hctz) .... One By Mouth Daily 11)  Flector 1.3 %  Ptch (Diclofenac Epolamine) .... Apply To Shoulder  Every 12 Hours As Needed 12)  Tramadol Hcl 50 Mg Tabs (Tramadol Hcl) .... One By Mouth Q 4-6 Hour As Needed Pain 13)  Nexium 40 Mg Cpdr (Esomeprazole Magnesium) .... One By Mouth Daily 14)  Metoclopramide Hcl 5 Mg Tabs (Metoclopramide Hcl) .... One By Mouth Q Ac Supper and Bedtime 15)  Clidinium-Chlordiazepoxide 2.5-5 Mg Caps (Clidinium-Chlordiazepoxide) .Marland Kitchen.. 1 By Mouth Three Times A Day As Needed 16)  Phillips Colon Health  Caps (Probiotic Product) .... Two Times A Day 17)  Aciphex 20 Mg  Tbec (Rabeprazole Sodium) .... Take 1 Each Day 30 Minutes Before Meals 18)  Cyclobenzaprine Hcl 5 Mg Tabs (Cyclobenzaprine Hcl) .... One By Mouth Q 8 Hours As Needed Muscle Spasm 19)  Valacyclovir Hcl 500 Mg Tabs (Valacyclovir Hcl) .... One By Mouth  Two Times A Day For 5 Days As Needed Flair  Allergies (verified): 1)  ! Codeine  Contraindications/Deferment of Procedures/Staging:    Test/Procedure: FLU VAX    Reason for deferment: patient declined   Past History:  Family History: Last updated: 04/26/2009 Father suicide Family History of CAD Male 1st degree relative <50 Mother died from  Family History Kidney disease His brother had an MI at age 11  Social History: Last updated: 11/06/2009 Married Alcohol use-no Nonsmoker He used to work at [employer] but has been disabled for the past 5 years. Occupation: retired Daily Caffeine Use  Risk Factors: Smoking Status: never (03/25/2010)  Past medical, surgical, family and social histories (including risk factors) reviewed, and no changes noted (except as noted below).  Past Medical History: Reviewed history from 11/06/2009 and no changes required. 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 Father suicide Family History of CAD Male 1st degree relative <50 Mother died from  Family History Kidney disease His brother had an MI at age 56  Social History: Reviewed history from 11/06/2009 and no changes required. Married Alcohol use-no Nonsmoker He used to work at [employer] but has been disabled for the past 5 years. Occupation: retired Daily Caffeine Use  Review of Systems  The patient denies anorexia, fever, weight loss, weight gain, vision loss, decreased hearing, hoarseness, chest pain, syncope, dyspnea on exertion, peripheral edema, prolonged cough, headaches, hemoptysis, abdominal pain, melena, hematochezia, severe indigestion/heartburn, hematuria, incontinence, genital sores, muscle weakness, suspicious skin lesions, transient blindness, difficulty walking, depression, unusual weight change, abnormal bleeding, enlarged lymph nodes, angioedema, and breast masses.    Physical Exam  General:  Well-developed,well-nourished,in no acute distress; alert,appropriate and cooperative throughout examination Head:  Normocephalic and atraumatic without obvious abnormalities. No apparent alopecia or balding. Eyes:  increased nystagmus.   Ears:  R ear normal and L ear normal.   Nose:  nasal dischargemucosal pallor.   Mouth:  tonsil hypertropied, posterior lymphoid hypertrophy, and postnasal drip.   Neck:  patient has pain with lateral bending to the right or left side. No spinal tenderness. Chest Wall:  nontender Lungs:  Normal respiratory effort, chest expands symmetrically. Lungs are clear to auscultation, no crackles or wheezes. Heart:  normal rate and regular rhythm.   Abdomen:   soft and nontender.   Impression & Recommendations:  Problem # 1:  HYPERTENSION (ICD-401.9) Assessment Deteriorated was on micardis in the past but stopped due to hypotension His updated medication list for this problem includes:    Benicar Hct 20-12.5 Mg Tabs (Olmesartan medoxomil-hctz) ..... One by mouth daily  BP today: 164/100 Prior BP: 180/110 (02/21/2010)  Prior 10 Yr Risk Heart Disease: Not enough information (10/03/2008)  Labs Reviewed: K+: 4.4 (11/06/2009) Creat: : 1.2 (11/06/2009)   Chol: 177 (11/30/2008)   HDL: 39.40 (11/30/2008)   LDL: DEL (04/04/2008)   TG: 219.0 (11/30/2008)  Problem # 2:  SHOULDER PAIN, BILATERAL (ICD-719.41) Assessment: Unchanged suspect that this is due to the low c spine dz His updated medication list for this problem includes:    Hydromorphone Hcl 4 Mg Tabs (Hydromorphone hcl) ..... One by mouth q 6 hour prn  Tramadol Hcl 50 Mg Tabs (Tramadol hcl) ..... One by mouth q 4-6 hour as needed pain    Cyclobenzaprine Hcl 5 Mg Tabs (Cyclobenzaprine hcl) ..... One by mouth q 8 hours as needed muscle spasm  Discussed shoulder exercises, use of moist heat or ice, and medication.  possible cervical disc dz  Orders: T-Cervical Spine Comp w/Flex & Ext ([address]UE)  Problem # 3:  OSTEOARTHRITIS (ICD-715.90)  His updated medication list for this problem includes:    Hydromorphone Hcl 4 Mg Tabs (Hydromorphone hcl) ..... One by mouth q 6 hour prn    Tramadol Hcl 50 Mg Tabs (Tramadol hcl) ..... One by mouth q 4-6 hour as needed pain  Discussed use of medications, application of heat or cold, and exercises.   Complete Medication List: 1)  Promethazine Hcl 50 Mg Tabs (Promethazine hcl) .... Every 4 hours as needed 2)  Crestor 10 Mg Tabs (Rosuvastatin calcium) .... Take 1 tablet by mouth once a day 3)  Metamucil Caps (Psyllium caps) .... As needed 4)  Hydromorphone Hcl 4 Mg Tabs (Hydromorphone hcl) .... One by mouth q 6 hour prn 5)  Meclizine Hcl 12.5 Mg  Tabs (Meclizine hcl) .... Two by mouth q 6 hour as needed 6)  Amitriptyline Hcl 25 Mg Tabs (Amitriptyline hcl) .... 5 pills each night 7)  Acidophilus Extra Strength Caps (Lactobacillus) .... Once daily 8)  Bl Stool Softener 100 Mg Caps (Docusate sodium) .... Qdhs 9)  Promethegan 25 Mg Supp (Promethazine hcl) .... One pr as needed nausia every 6 hours as needed 10)  Benicar Hct 20-12.5 Mg Tabs (Olmesartan medoxomil-hctz) .... One by mouth daily 11)  Flector 1.3 % Ptch (Diclofenac epolamine) .... Apply to shoulder  every 12 hours as needed 12)  Tramadol Hcl 50 Mg Tabs (Tramadol hcl) .... One by mouth q 4-6 hour as needed pain 13)  Nexium 40 Mg Cpdr (Esomeprazole magnesium) .... One by mouth daily 14)  Metoclopramide Hcl 5 Mg Tabs (Metoclopramide hcl) .... One by mouth q ac supper and bedtime 15)  Clidinium-chlordiazepoxide 2.5-5 Mg Caps (Clidinium-chlordiazepoxide) .Marland Kitchen.. 1 by mouth three times a day as needed 16)  Phillips Colon Health Caps (Probiotic product) .... Two times a day 17)  Aciphex 20 Mg Tbec (Rabeprazole sodium) .... Take 1 each day 30 minutes before meals 18)  Cyclobenzaprine Hcl 5 Mg Tabs (Cyclobenzaprine hcl) .... One by mouth q 8 hours as needed muscle spasm 19)  Valacyclovir Hcl 500 Mg Tabs (Valacyclovir hcl) .... One by mouth  two times a day for 5 days as needed flair  Hypertension Assessment/Plan:      The patient's hypertensive risk group is category B: At least one risk factor (excluding diabetes) with no target organ damage.  Today's blood pressure is 164/100.  His blood pressure goal is < 140/90.  Patient Instructions: 1)  Please schedule a follow-up appointment in 6  weeks. Prescriptions: VALACYCLOVIR HCL 500 MG TABS (VALACYCLOVIR HCL) one by mouth  two times a day for 5 days as needed flair  #20 x 11   Entered and Authorized by:   Stacie Glaze MD   Signed by:   Stacie Glaze MD on 03/25/2010   Method used:   Electronically to        [facility]  [address]  (retail)       [address]       [place], [place]  [address]  Ph: [phone]       Fax: [phone]   RxID:   [id number] BENICAR HCT 20-12.5 MG TABS (OLMESARTAN MEDOXOMIL-HCTZ) one by mouth daily  #30 x 11   Entered and Authorized by:   Stacie Glaze MD   Signed by:   Stacie Glaze MD on 03/25/2010   Method used:   Electronically to        [facility]  [address] (retail)       [address]       [place], [place]  [address]       Ph: [phone]       Fax: [phone]   RxID:   [id number]

## 2010-07-30 NOTE — Assessment & Plan Note (Signed)
Summary: NECK/SHOULDER PAIN./NJR   Vital Signs:  Patient profile:   65 year old male Temp:     98.6 degrees F oral BP sitting:   180 / 110  (left arm) Cuff size:   regular  Vitals Entered By: Sid Falcon LPN (February 21, 2010 2:34 PM)  Serial Vital Signs/Assessments:  Time      Position  BP       Pulse  Resp  Temp     By 2:43 PM             160/90                         Sid Falcon LPN  CC: left shoulder, neck, arm pain, Hypertension Management   History of Present Illness: Same day appointment. Patient seen with left neck and shoulder pain. History of frequent similar pain in past.  Onset about 3 weeks ago. Deep achy pain. No known injury. Pain radiates from lower neck region towards shoulder. Worse with turning head to the left. Denies any numbness or weakness.  Ice, tramadol, and Aleve with minimal relief. Patient relates he's had injections upper back in the past which seemed to help. Sensation of muscle spasm.  Hypertension treated with triamterene HCTZ. Not monitoring blood pressures.  Hypertension History:      He denies headache, chest pain, palpitations, dyspnea with exertion, orthopnea, PND, peripheral edema, visual symptoms, neurologic problems, and syncope.        Positive major cardiovascular risk factors include male age 25 years old or older, hyperlipidemia, and hypertension.  Negative major cardiovascular risk factors include non-tobacco-user status.     Allergies: 1)  ! Codeine  Past History:  Past Medical History: Last updated: 11/06/2009 GERD Hyperlipidemia DJD-knee Chronic Vertigo Benign Brain lesion Osteoarthritis Hypertension Diverticulosis, colon Arthritis hx of colon polyps Irritable Bowel Syndrome ulcers in the GI tract PMH reviewed for relevance  Review of Systems      See HPI  Physical Exam  General:  Well-developed,well-nourished,in no acute distress; alert,appropriate and cooperative throughout examination Neck:  patient has  pain with lateral bending to the right or left side. No spinal tenderness. Lungs:  Normal respiratory effort, chest expands symmetrically. Lungs are clear to auscultation, no crackles or wheezes. Heart:  normal rate and regular rhythm.   Msk:  patient has significant left trapezius muscle tenderness and one localized trigger point in the trapezius on the left side. Extremities:  No clubbing, cyanosis, edema, or deformity noted with normal full range of motion of all joints.   Neurologic:  alert & oriented X3, cranial nerves II-XII intact, strength normal in all extremities, sensation intact to light touch, and DTRs symmetrical and normal.   Skin:  no rashes.   Cervical Nodes:  No lymphadenopathy noted   Impression & Recommendations:  Problem # 1:  BACK PAIN, UPPER (ICD-724.5) probably has some muscle spasm and trigger point.  Discussed risks and benefits of steroid injection L trapezius and pt consented.  Injected depomedrol 40 mg and 1/2 cc plain xylocaine L trapezius and pt tolerated well.  His updated medication list for this problem includes:    Hydromorphone Hcl 4 Mg Tabs (Hydromorphone hcl) ..... One by mouth q 6 hour prn    Tramadol Hcl 50 Mg Tabs (Tramadol hcl) ..... One by mouth q 4-6 hour as needed pain    Cyclobenzaprine Hcl 5 Mg Tabs (Cyclobenzaprine hcl) ..... One by mouth q 8 hours as needed muscle  spasm  Orders: Trigger Point Injection (1 or 2 muscles) ([address])  Problem # 2:  HYPERTENSION (ICD-401.9) elevation ?sec to pain.  Pt to follow up with Dr Lovell Sheehan in 1 month. His updated medication list for this problem includes:    Triamterene-hctz 37.5-25 Mg Tabs (Triamterene-hctz) ..... One by mouth daily  replaces the hctz  Complete Medication List: 1)  Promethazine Hcl 50 Mg Tabs (Promethazine hcl) .... Every 4 hours as needed 2)  Crestor 10 Mg Tabs (Rosuvastatin calcium) .... Take 1 tablet by mouth once a day 3)  Metamucil Caps (Psyllium caps) .... As needed 4)   Hydromorphone Hcl 4 Mg Tabs (Hydromorphone hcl) .... One by mouth q 6 hour prn 5)  Meclizine Hcl 12.5 Mg Tabs (Meclizine hcl) .... Two by mouth q 6 hour as needed 6)  Amitriptyline Hcl 25 Mg Tabs (Amitriptyline hcl) .... 5 pills each night 7)  Acidophilus Extra Strength Caps (Lactobacillus) .... Once daily 8)  Bl Stool Softener 100 Mg Caps (Docusate sodium) .... Qdhs 9)  Promethegan 25 Mg Supp (Promethazine hcl) .... One pr as needed nausia every 6 hours as needed 10)  Triamterene-hctz 37.5-25 Mg Tabs (Triamterene-hctz) .... One by mouth daily  replaces the hctz 11)  Flector 1.3 % Ptch (Diclofenac epolamine) .... Apply to shoulder  every 12 hours as needed 12)  Tramadol Hcl 50 Mg Tabs (Tramadol hcl) .... One by mouth q 4-6 hour as needed pain 13)  Nexium 40 Mg Cpdr (Esomeprazole magnesium) .... One by mouth daily 14)  Metoclopramide Hcl 5 Mg Tabs (Metoclopramide hcl) .... One by mouth q ac supper and bedtime 15)  Zithromax Z-pak 250 Mg Tabs (Azithromycin) .... As directed 16)  Clidinium-chlordiazepoxide 2.5-5 Mg Caps (Clidinium-chlordiazepoxide) .Marland Kitchen.. 1 by mouth three times a day as needed 17)  Phillips Colon Health Caps (Probiotic product) .... Two times a day 18)  Aciphex 20 Mg Tbec (Rabeprazole sodium) .... Take 1 each day 30 minutes before meals 19)  Diflucan 100 Mg Tabs (Fluconazole) .Marland Kitchen.. 1 tablet twice a day x1 day, then 1tablet daily for 2 weeks 20)  Cyclobenzaprine Hcl 5 Mg Tabs (Cyclobenzaprine hcl) .... One by mouth q 8 hours as needed muscle spasm  Hypertension Assessment/Plan:      The patient's hypertensive risk group is category B: At least one risk factor (excluding diabetes) with no target organ damage.  Today's blood pressure is 180/110.  His blood pressure goal is < 140/90.  Patient Instructions: 1)  Schedule followup with Dr. Lovell Sheehan in 2-4 weeks to reassess blood pressure 2)  Try topical such as Biofreeze to upper back muscles. 3)  Use muscle  massage. Prescriptions: CYCLOBENZAPRINE HCL 5 MG TABS (CYCLOBENZAPRINE HCL) one by mouth q 8 hours as needed muscle spasm  #20 x 0   Entered and Authorized by:   Evelena Peat MD   Signed by:   Evelena Peat MD on 02/21/2010   Method used:   Faxed to ...       [facility]       [address]       [place], [place]  [address]       Ph: [phone] or [phone]       Fax: [phone]   RxID:   [id number]

## 2010-07-30 NOTE — Procedures (Signed)
Summary: Colonoscopy  Patient: [name] Note: All result statuses are Final unless otherwise noted.  Tests: (1) Colonoscopy (COL)   COL Colonoscopy           DONE     [facility]     [address]     [place], [place]  [address]           COLONOSCOPY PROCEDURE REPORT           PATIENT:  [name], [name]  MR#:  [id number]     BIRTHDATE:  1945-11-09, 63 yrs. old  GENDER:  male     ENDOSCOPIST:  Vania Rea. Jarold Motto, MD, [facility]     REF. BY:     PROCEDURE DATE:  11/14/2009     PROCEDURE:  Colonoscopy with biopsy     ASA CLASS:  Class II     INDICATIONS:  colorectal cancer screening, average risk     MEDICATIONS:   Fentanyl 75 mcg IV, Versed 7 mg IV           DESCRIPTION OF PROCEDURE:   After the risks benefits and     alternatives of the procedure were thoroughly explained, informed     consent was obtained.  Digital rectal exam was performed and     revealed no abnormalities.   The LB [id number] endoscope     was introduced through the anus and advanced to the cecum, which     was identified by both the appendix and ileocecal valve, without     limitations.  The quality of the prep was excellent, using     MoviPrep.  The instrument was then slowly withdrawn as the colon     was fully examined.     <<PROCEDUREIMAGES>>           FINDINGS:  Moderate diverticulosis was found in the sigmoid to     descending colon segments.  Mild diverticulosis was found in the     right colon.  There were multiple polyps identified and removed.     in the rectum. They were diminutive. COLD BIOPSY REMOVED.     Retroflexed views in the rectum revealed no abnormalities.    The     scope was then withdrawn from the patient and the procedure     completed.           COMPLICATIONS:  None     ENDOSCOPIC IMPRESSION:     1) Moderate diverticulosis in the sigmoid to descending colon     segments     2) Mild diverticulosis in the right colon     3) Polyps, multiple in the rectum     R/O  ADENOMAS.     RECOMMENDATIONS:     1) Follow up colonoscopy in 5 years     2) high fiber diet     REPEAT EXAM:  No           ______________________________     Vania Rea. Jarold Motto, MD, Clement[facility] Graham           CC:  Stacie Glaze, MD           n.     Rosalie DoctorMarland Kitchen   Vania Rea. Mearl Harewood at 11/14/2009 03:47 PM           Olene Floss, [id number]  Note: An exclamation mark (!) indicates a result that was not dispersed into the flowsheet. Document Creation Date: 11/15/2009 5:07 PM _______________________________________________________________________  (1) Order result status: Final Collection or  observation date-time: 11/14/2009 15:43 Requested date-time:  Receipt date-time:  Reported date-time:  Referring Physician:   Ordering Physician: Sheryn Bison [phone]) Specimen Source:  Source: Launa Grill Order Number: [id number] Lab site:   Appended Document: Colonoscopy     Procedures Next Due Date:    Colonoscopy: 10/2019

## 2010-08-01 NOTE — Letter (Signed)
Summary: [facility]  [facility]   Imported By: Maryln Gottron 07/18/2010 09:39:16  _____________________________________________________________________  External Attachment:    Type:   Image     Comment:   External Document

## 2010-08-01 NOTE — Consult Note (Signed)
Summary: [facility]  [facility]   Imported By: Maryln Gottron 06/26/2010 13:38:43  _____________________________________________________________________  External Attachment:    Type:   Image     Comment:   External Document

## 2010-08-01 NOTE — Progress Notes (Signed)
  Phone Note Call from Patient   Caller: Patient Call For: Stacie Glaze MD Reason for Call: Acute Illness Summary of Call: Infected cyst on back that is swollen and painful.  Asking to see Dr. Lovell Sheehan. [phone] Initial call taken by: Lynann Beaver CMA AAMA,  June 19, 2010 10:16 AM  Follow-up for Phone Call        per dr Lovell Sheehan kelfex 500 four times a day for 10 days and then i &d  with available md Follow-up by: Willy Eddy, LPN,  June 19, 2010 10:33 AM    New/Updated Medications: KEFLEX 500 MG CAPS (CEPHALEXIN) 1 four times a day for 10 days Prescriptions: KEFLEX 500 MG CAPS (CEPHALEXIN) 1 four times a day for 10 days  #40 x 0   Entered by:   Willy Eddy, LPN   Authorized by:   Stacie Glaze MD   Signed by:   Willy Eddy, LPN on 45/40/9811   Method used:   Electronically to        [facility]  [address] (retail)       [address]       [place], [place]  [address]       Ph: [phone]       Fax: [phone]   RxID:   [id number]

## 2010-08-11 ENCOUNTER — Emergency Department (HOSPITAL_COMMUNITY)
Admission: EM | Admit: 2010-08-11 | Discharge: 2010-08-12 | Disposition: A | Discharge status: Home / Self Care | Payer: Medicare Other | Attending: Emergency Medicine | Admitting: Emergency Medicine

## 2010-08-11 DIAGNOSIS — K5730 Diverticulosis of large intestine without perforation or abscess without bleeding: Secondary | ICD-10-CM | POA: Insufficient documentation

## 2010-08-11 DIAGNOSIS — I10 Essential (primary) hypertension: Secondary | ICD-10-CM | POA: Insufficient documentation

## 2010-08-11 DIAGNOSIS — K625 Hemorrhage of anus and rectum: Secondary | ICD-10-CM | POA: Insufficient documentation

## 2010-08-12 LAB — COMPREHENSIVE METABOLIC PANEL
ALT: 17 U/L (ref 0–53)
AST: 19 U/L (ref 0–37)
Albumin: 3.9 g/dL (ref 3.5–5.2)
BUN: 23 mg/dL (ref 6–23)
Chloride: 100 mEq/L (ref 96–112)
Creatinine, Ser: 1.22 mg/dL (ref 0.4–1.5)
GFR calc non Af Amer: 60 mL/min — ABNORMAL LOW (ref >60)
Glucose, Bld: 140 mg/dL — ABNORMAL HIGH (ref 70–99)
Sodium: 134 mEq/L — ABNORMAL LOW (ref 135–145)
Total Bilirubin: 0.4 mg/dL (ref 0.3–1.2)
Total Protein: 6.8 g/dL (ref 6.0–8.3)

## 2010-08-12 LAB — CBC
Hemoglobin: 14.3 g/dL (ref 13.0–17.0)
MCH: 31.6 pg (ref 26.0–34.0)
MCV: 89.4 fL (ref 78.0–100.0)
RDW: 12.9 % (ref 11.5–15.5)

## 2010-08-12 LAB — DIFFERENTIAL
Basophils Absolute: 0 10*3/uL (ref 0.0–0.1)
Basophils Relative: 0 % (ref 0–1)
Eosinophils Relative: 1 % (ref 0–5)
Lymphocytes Relative: 26 % (ref 12–46)
Lymphs Abs: 3.4 10*3/uL (ref 0.7–4.0)
Neutro Abs: 8.4 10*3/uL — ABNORMAL HIGH (ref 1.7–7.7)

## 2010-08-12 LAB — APTT: aPTT: 30 seconds (ref 24–37)

## 2010-08-14 ENCOUNTER — Encounter: Payer: Self-pay | Admitting: Internal Medicine

## 2010-08-14 ENCOUNTER — Ambulatory Visit (INDEPENDENT_AMBULATORY_CARE_PROVIDER_SITE_OTHER): Payer: Medicare Other | Admitting: Internal Medicine

## 2010-08-14 VITALS — BP 116/80 | HR 76 | Temp 98.4°F | Resp 14 | Ht 68.0 in | Wt 214.0 lb

## 2010-08-14 DIAGNOSIS — K625 Hemorrhage of anus and rectum: Secondary | ICD-10-CM

## 2010-08-14 DIAGNOSIS — I10 Essential (primary) hypertension: Secondary | ICD-10-CM

## 2010-08-14 DIAGNOSIS — M1990 Unspecified osteoarthritis, unspecified site: Secondary | ICD-10-CM

## 2010-08-14 DIAGNOSIS — M25519 Pain in unspecified shoulder: Secondary | ICD-10-CM

## 2010-08-14 DIAGNOSIS — K5730 Diverticulosis of large intestine without perforation or abscess without bleeding: Secondary | ICD-10-CM

## 2010-08-14 DIAGNOSIS — K5901 Slow transit constipation: Secondary | ICD-10-CM

## 2010-08-14 MED ORDER — CELECOXIB 200 MG PO CAPS: 200.0000 mg | ORAL_CAPSULE | Freq: Every day | ORAL | Status: DC

## 2010-08-14 NOTE — Assessment & Plan Note (Signed)
Injections have helped but the use of chronic nonsteroidal has also helped however this has been complicated by a bleeding event we will discontinue all potential nonsteroidal 10 aspirin medications and replace it with a Cox 2 inhibitor Celebrex with continued vigilant monitoring a CBC will be checked today.

## 2010-08-14 NOTE — Patient Instructions (Signed)
Do not take aspirin meloxicam over any patch containing a nonsteroidal. He may use Celebrex for arthritic pain but if you see any bright red blood per rectum discontinued this medication immediately and call our office we're obtaining a complete blood count today if it does show anemia we will need to consider referring you to a gastroenterologist for a look at those diverticuli to make sure that the source of bleeding was not diverticular it is important that you keep your stools soft and did not experience straining with constipation using fiber or over-the-counter MiraLax as needed

## 2010-08-14 NOTE — Progress Notes (Signed)
  Subjective:    Patient ID: [name], male    DOB: Jul 05, 1945, 65 y.o.   MRN: [id number]  HPI  The pt was seen in the ER or 08/11/10 for bright red blood per rectum. The pt noted a sense of gas and when he went to the rest room he  Had copious blood in his stool. The risk factors for this include taking meloxican for 30 days prior for neck pain from the neurologist. PMH is significant for diverticulosis. He has a prior bleeding event on coumadin. In in the ER his CBC was stable... But this was acute.    Review of Systems     Objective:   Physical Exam        Assessment & Plan:

## 2010-08-14 NOTE — Assessment & Plan Note (Signed)
The patient had bright red blood per rectum on 2/12  But has not had any recurrent symptoms since that episode he was evaluated in the emergency room for possible diverticulitis versus a diverticular bleed.  He does have a history of a diverticular bleed in the past when he was on Coumadin currently he is taking meloxicam and an 81 mg aspirin there his risk factors for coagulation status he does have chronic intermittent constipation which is a functional risk factor for diverticular bleed.   Our intervention would be to remove as many of the risk factors as possible keep the stool as soft as possible consider changing the meloxicam to Celebrex

## 2010-08-14 NOTE — Assessment & Plan Note (Signed)
Due to his GI event we have discontinued the aspirin and the meloxicam and we'll try Celebrex after we monitor her CBC today

## 2010-08-15 LAB — CBC WITH DIFFERENTIAL/PLATELET
Basophils Absolute: 0 10*3/uL (ref 0.0–0.1)
Basophils Relative: 0.3 % (ref 0.0–3.0)
Eosinophils Absolute: 0.1 10*3/uL (ref 0.0–0.7)
HCT: 40.8 % (ref 39.0–52.0)
Hemoglobin: 14 g/dL (ref 13.0–17.0)
Lymphs Abs: 2.5 10*3/uL (ref 0.7–4.0)
MCHC: 34.2 g/dL (ref 30.0–36.0)
Neutro Abs: 7.5 10*3/uL (ref 1.4–7.7)
RBC: 4.39 Mil/uL (ref 4.22–5.81)
RDW: 13.5 % (ref 11.5–14.6)

## 2010-09-25 ENCOUNTER — Ambulatory Visit: Payer: Self-pay | Admitting: Internal Medicine

## 2010-10-03 LAB — CBC
Hemoglobin: 16.2 g/dL (ref 13.0–17.0)
Platelets: 254 10*3/uL (ref 150–400)
RDW: 12.6 % (ref 11.5–15.5)

## 2010-10-03 LAB — DIFFERENTIAL
Basophils Absolute: 0 10*3/uL (ref 0.0–0.1)
Basophils Relative: 0 % (ref 0–1)
Lymphocytes Relative: 21 % (ref 12–46)
Monocytes Absolute: 1.2 10*3/uL — ABNORMAL HIGH (ref 0.1–1.0)
Neutro Abs: 9.9 10*3/uL — ABNORMAL HIGH (ref 1.7–7.7)
Neutrophils Relative %: 69 % (ref 43–77)

## 2010-10-03 LAB — URINALYSIS, ROUTINE W REFLEX MICROSCOPIC
Bilirubin Urine: NEGATIVE
Hgb urine dipstick: NEGATIVE
Ketones, ur: NEGATIVE mg/dL
Nitrite: NEGATIVE
Specific Gravity, Urine: 1.018 (ref 1.005–1.030)
pH: 7.5 (ref 5.0–8.0)

## 2010-10-03 LAB — TROPONIN I: Troponin I: 0.02 ng/mL (ref 0.00–0.06)

## 2010-10-03 LAB — URINE CULTURE: Colony Count: 1000

## 2010-10-03 LAB — CK TOTAL AND CKMB (NOT AT ARMC)
CK, MB: 1.7 ng/mL (ref 0.3–4.0)
Relative Index: 1.2 (ref 0.0–2.5)
Total CK: 138 U/L (ref 7–232)

## 2010-10-03 LAB — BASIC METABOLIC PANEL
Calcium: 9.8 mg/dL (ref 8.4–10.5)
Creatinine, Ser: 1.12 mg/dL (ref 0.4–1.5)
GFR calc non Af Amer: >60 mL/min (ref >60)
Glucose, Bld: 111 mg/dL — ABNORMAL HIGH (ref 70–99)
Sodium: 135 mEq/L (ref 135–145)

## 2010-11-15 NOTE — Discharge Summary (Signed)
NAMEGEVON, Charles             ACCOUNT NO.:  [id number]   MEDICAL RECORD NO.:  [id number]          PATIENT TYPE:  INP   LOCATION:  1516                         FACILITY:  [facility]   PHYSICIAN:  Ollen Gross, M.D.    DATE OF BIRTH:  February 10, 1946   DATE OF ADMISSION:  06/01/2006  DATE OF DISCHARGE:  06/04/2006                               DISCHARGE SUMMARY   ADMITTING DIAGNOSES:  1. Osteoarthritis, left knee.  2. Hypertension.  3. Hiatal hernia.  4. Reflux disease.  5. History of ulcers.  6. History of gastritis.  7. Diverticulosis.  8. History of urinary tract infections.  9. History of yeast infection.  10.Degenerative disk disease.  11.Vertigo.  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   DISCHARGE DIAGNOSES:  1. Osteoarthritis, left knee, status post left total knee replacement      arthroplasty.  2. Mild postop acute blood loss anemia.  3. Postop hyponatremia, improving.  4. Hypertension.  5. Hiatal hernia.  6. Reflux disease.  7. History of ulcers.  8. History of gastritis.  9. Diverticulosis.  10.History of urinary tract infections.  11.History of yeast infection.  12.Degenerative disk disease.  13.Vertigo.  14.History of acoustic tumor.   PROCEDURE:  June 01, 2006, left total knee, Surgeon: Dr. Lequita Halt.  Assistant: Avel Peace, PA-C.  Anesthesia:  General.  Tourniquet time:  43 minutes.   CONSULTS:  None.   BRIEF HISTORY:  [name] is a 65 year old male with severe end-stage  arthritis of the left knee with valgus deformity.  He has failed  nonoperative management including injections, now presents now for total  knee.   LABORATORY DATA:  Preop CBC:  Hemoglobin 15.8, hematocrit 46.0, white  cell count 12.0.  Serial H&H's were followed.  Postop hemoglobin 12.  Last noted h&H 11.4 and 33.4.  PT/PTT on admission were 13.4 and 29,  respectively with an INR of 1.0.  Serial pro times followed.  Last noted  PT/INR 22.4 and 1.9.  Chem panel on admission all within  normal limits  with the exception of minimally elevated glucose of 115.  Serial BMETs  were followed.  Sodium did drop from 139 to 133 back up to 134.  Preop  UA negative.  Blood group type O positive.   Chest x-ray May 25, 2006, stable examination.  No active  cardiopulmonary process demonstrated.   EKG May 25, 2006, normal sinus rhythm, normal EKG confirmed by Dr.  Lady Deutscher.   [name] COURSE:  The patient admitted to [name],  tolerated the procedure well, later transferred to the recovery room and  then to the orthopedic floor, started on PCA and p.o. analgesics for  pain control following surgery.  He actually was doing pretty well on  the morning of day 1 with the exception of a fair amount of pain through  the night, also had a little bit of nausea, but mostly that had resolved  by the morning of day 1.  He was seen in rounds, starting getting up  with physical therapy.  He was diuresing his fluid fairly well.  Sodium  did have a little  bit of drop, so we changed the fluids from half-normal  to normal saline.  By day 2, he was doing much better.  His nausea had  resolved, starting getting up with physical therapy, ambulated about 100  feet.  Dressing change; the incision looked good, pain pump removed.  Hemovac had been removed on day 1, pain pump removed on day 2,  progressed well and was ready to go home by the following day on  June 04, 2006, after being seen in rounds and undergoing therapy.   DISCHARGE PLAN:  1. The patient discharged home on June 04, 2006.  2. Discharge diagnoses:  Please see above.  3. Discharge medications:  Coumadin, Dilaudid, Robaxin, Phenergan.  4. Diet:  Resume home diet.  5. Follow up:  Two weeks.  6. Activity:  Weightbearing as tolerated, home health PT and home      health nursing, total knee protocol.   DISPOSITION:  Home.   CONDITION UPON DISCHARGE:  Improved.      Alexzandrew L. Julien Girt,  P.A.      Ollen Gross, M.D.  Electronically Signed    ALP/MEDQ  D:  06/04/2006  T:  06/04/2006  Job:  [id number]   cc:   Stacie Glaze, MD  [address] [place]  [place] [address]   Courtney Paris, M.D.  Fax: [phone]

## 2010-11-15 NOTE — H&P (Signed)
[place]. [facility]  Patient:    [name], [name] Visit Number: [id number] MRN: [id number]          Service Type: Attending:  Kerrin Champagne, M.D. Dictated by:   Verlin Fester, P.A.-C. Adm. Date:  06/11/01                           History and Physical  CHIEF COMPLAINT:  Right ankle pain.  HISTORY OF PRESENT ILLNESS:  Patient is a 65 year old male who was at home in his garage earlier today when his right knee gave out causing him to fall. His leg got twisted and caught underneath him.  He had immediate onset of right ankle pain.  He has a history of severe osteoarthritis in bilateral knees which he believes is the cause of his fall today, the pain causing his knee to give way.  ALLERGIES:  CODEINE and MORPHINE both cause nausea and vomiting.  MEDICATIONS:  Nexium, hydrochlorothiazide, amitriptyline, Tigan.  PAST MEDICAL HISTORY: 1. Acoustic tumor. 2. Gastroesophageal reflux disease. 3. Hypertension. 4. 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  PAST SURGICAL HISTORY: 1. Right knee arthroscopic surgery. 2. Left knee meniscectomy. 3. Tonsillectomy.  SOCIAL HISTORY:  Patient denies tobacco use.  Denies alcohol use.  He is married.  Has a 49 year old daughter.  He does not work.  He is disabled secondary to this tumor and the significant vertigo that results from it.  REVIEW OF SYSTEMS:  Patient denies any fevers, chills, night sweats, or bleeding tendencies.  Denies any blurred vision, double vision, headaches, seizures, paralysis.  CARDIOVASCULAR:  Denies any chest pain, palpitations, angina, orthopnea, or claudication.  PULMONARY:  Denies any shortness of breath, productive cough, or hemoptysis.  GASTROINTESTINAL:  Positive for nausea which is all related to the acoustic tumor, vertigo.  Denies any constipation, diarrhea, or vomiting.  Denies any melena or bloody stool. GENITOURINARY:  Denies any dysuria, hematuria, or discharge.   MUSCULOSKELETAL: Denies any paraesthesias, numbness, or paralysis in his extremities.  He does have the pain in bilateral knees secondary to the OA.  PHYSICAL EXAMINATION  VITAL SIGNS:  Blood pressure 126/80, respirations 16 and unlabored, pulse 88 and regular.  GENERAL:  Patient is a 65 year old white male who is in obvious discomfort secondary to right ankle injury.  He is alert and oriented.  He is well-nourished, well groomed, very pleasant to the examination.  HEENT:  Head is normocephalic, atraumatic.  Pupils are equal, round and reactive to light.  Extraocular movements are intact.  Nares are patent bilaterally.  Pharynx is clear without any erythema or exudate.  NECK:  Soft ______ to palpation.  No bruits appreciated.  No thyromegaly noted.  CHEST:  Clear to auscultation bilaterally.  No rales, rhonchi, ______, or friction rub.  BREASTS:  Not pertinent.  Not performed.  HEART:  S1, S2.  Regular rate and rhythm.  No murmurs, gallops, or rubs noted.  ABDOMEN:  Hyperactive bowel sounds throughout.  Nontender, nondistended.  No organomegaly noted.  GENITOURINARY:  Not pertinent.  Not performed.  EXTREMITIES:  Right lower extremity, right ankle has some obvious swelling. He is in a splint for immobilization at this point.  This was not removed. Positive dorsalis pedis and posterior tibialis pulses.  Sensation is intact to light touch throughout the ankle and foot.  Motor function is intact distally.  SKIN:  Intact without any lesions or rashes.  LABORATORIES:  X-ray reveals a trimalleolar fracture to the  right ankle.  IMPRESSION: 1. Right ankle trimalleolar fracture. 2. Acoustic tumor. 3. Gastroesophageal reflux disease. 4. Hypertension. 5. 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  PLAN: 1. Admit to Dr. Vira Browns to take to the operating room for an open    reduction internal fixation of the right ankle fracture. 2. Patients primary care physician is Dr. Darryll Capers who  will be notified    in the event that we need any assistance with the patients medical    management postoperatively. Dictated by:   Verlin Fester, P.A.-C. Attending:  Kerrin Champagne, M.D. DD:  06/11/01 TD:  06/11/01 Job: [id number]

## 2010-11-15 NOTE — Discharge Summary (Signed)
[place]. [facility]  Patient:    [name], [name] Visit Number: [id number] MRN: [id number]          Service Type: MED Location: [id number] Attending Physician:  Lubertha South Dictated by:   Sammuel Cooper Mahar, P.A.C. Admit Date:  06/11/2001 Discharge Date: 06/14/2001                             Discharge Summary  DATE OF BIRTH:  31-Dec-1945.  ADMISSION DIAGNOSES: 1. Right ankle trimalleolar fracture. 2. Acoustic tumor. 3. Gastroesophageal reflux disease. 4. Hypertension. 5. 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  DISCHARGE DIAGNOSES: 1. Status post open reduction and internal fixation of right ankle fracture. 2. Postoperative nausea which was resolved prior to discharge. 3. Acoustic tumor. 4. Gastroesophageal reflux disease. 5. Hypertension. 6. 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  PROCEDURE PERFORMED:  Open reduction and internal fixation of right trimalleolar ankle fracture.  This was done by Kerrin Champagne, M.D., with the assistance of [facility]. Mahar, P.A.C. Anesthesia used was general.  CONSULTS:  None.  BRIEF HISTORY:  The patient is a 65 year old white male who was at home in his garage prior to being admitted to the hospital when his right knee gave out, causing him to fall.  He states that his leg got "twisted and caught underneath" him.  He had immediate onset of right ankle pain. He has a history of severe osteoarthritis of bilateral knees which he believes is the cause of his fall today.  He believes the pain caused his knee to give way.  He also has a significant history of back pain and radiating symptoms and weakness into bilateral legs, more specifically his right leg is worse than his left. Dr. Vira Browns was called by the ER physicians to evaluate [name].  Dr. Otelia Sergeant came and evaluated him and determined that the best course of management would be surgical intervention as above listed.  The patient as well as  his family were explained the risks and benefits of the surgery and they decided to proceed and demonstrated understanding.  LABS:  On admission on June 11, 2001, CBC was within normal limits with the exception of an elevated wbc of 21.2.  Differential showed neutrophils were elevated at 88, absolute neutrophils at 18.7, lymphs down at 7 and absolute monos elevated at 1.  PT/INR and PTT 13.1/1.0, and 29, respectively. Complete metabolic panel was within normal limits with the exception of a slightly elevated glucose at 118.  X-RAYS:  June 11, 2001, right ankle revealed oblique fracture with some displacement of the distal fibula, avulsion fracture is noted of the medial malleolus and avulsion fracture is also noted of the posterior malleolus. Trimalleolar fracture of the right ankle.  June 11, 2001, of the right ankle intraoperatively revealed near anatomic alignment, postop reduction trimalleolar fracture right ankle.  EKG from June 11, 2001, revealed normal sinus rhythm, no old tracing to compare, read by Maisie Fus A. Patty Sermons, M.D.  HOSPITAL COURSE:  On June 11, 2001, the patient was admitted to the [facility] and taken to the operating room for the above listed procedure. He tolerated the procedure very well without any intraoperative complications.  Estimated blood loss was less than 50 cc.  Tourniquet time was one hour and five minutes.  There were no intraoperative complications.  The patient was transferred to the recovery room in stable condition. Postoperatively, appropriate  antibiotic course was instituted and the patient completed the course without any difficulty or complications.  Diet was then advanced to regular as tolerated postoperatively and he did very well without any complications.  Neurovascular checks were instituted postoperatively and remained intact without any complications throughout his hospital stay.  Pain control  was initially gained utilizing Dilaudid PCA.  He was transitioned over to p.o. analgesics throughout postoperative day #1 and #2 without any problems and his pain control remained adequate.  Physical therapy and occupational therapy were consulted to assist the patient with some gait training and safety utilizing a walker.  He did very well with them and demonstrated and voiced the ability to safely use this equipment.  The patient was having nausea postoperatively and notified the nurses.  He was given some Phenergan and this did resolve this without any concerns further problems.  Discharge planning nurse was consulted to assist with arranging any home needs and equipment.  The patient stated that he had a walker and that he would not need any home health assistance and his wife would be available to help him as needed.  DISCHARGE PLAN:  On June 14, 2001, the patient was very eager to go home. He was feeling very well.  Orthopedically, he was safe and ready for discharge.  Medically, he was stable and ready for discharge.  DISCHARGE DIAGNOSIS:  A 65 year old white male who is status post right ankle fracture open reduction and internal fixation, doing very well and ready to go home with his family support.  ACTIVITY:  Strict nonweightbearing to the right lower extremity x 6 weeks.  He is to elevate the extremity above the level of his heart as much as possible. He is to keep his splint clean, dry and intact until his follow-up appointment with Dr. Otelia Sergeant.  DIET:  He is to resume his preoperative diet as tolerated.  MEDICATIONS:  Percocet, Robaxin, Phenergan and an aspirin a day.  FOLLOW-UP:  He will follow up with Dr. Vira Browns two weeks postoperatively on Friday, June 25, 2001, at 10:30 a.m.  The patient will call should he have any problems, concerns or questions between his discharge and the time of his follow-up appointment.  DISPOSITION:  The patient is being  discharged to his home with his wife for assistance.  He denies the need for any home health physical therapy or any  occupational therapy.  Home needs were arranged by our discharge planners prior to sending him home.  CONDITION ON DISCHARGE:  Stable and improved. Dictated by:   Sammuel Cooper. Mahar, .A.C. Attending Physician:  Lubertha South DD:  06/28/01 TD:  06/28/01 Job: [id number]

## 2010-11-15 NOTE — Discharge Summary (Signed)
[name], [name]                          ACCOUNT NO.:  [id number]   MEDICAL RECORD NO.:  [id number]                   PATIENT TYPE:  INP   LOCATION:  0454                                 FACILITY:  [facility]   PHYSICIAN:  Ollen Gross, M.D.                 DATE OF BIRTH:  12-21-1945   DATE OF ADMISSION:  07/31/2003  DATE OF DISCHARGE:  08/03/2003                                 DISCHARGE SUMMARY   ADMISSION DIAGNOSES:  1. Osteoarthritis right knee.  2. Hypertension.  3. Hiatal hernia.  4. Gastroesophageal reflux disease.  5. History of ulcers 34 years ago.  6. Diverticulosis.  7. Acoustic tumor.  8. 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   DISCHARGE DIAGNOSES:  1. Osteoarthritis right knee status post right total knee replacement     arthroplasty.  2. Mild postoperative blood loss anemia.  3. Mild hyponatremia improved.  4. Hypertension.  5. Hiatal hernia.  6. Gastroesophageal reflux disease.  7. History of ulcers 34 years ago.  8. Diverticulosis.  9. Acoustic tumor.  10.      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   PROCEDURE:  July 31, 2003 right total knee arthroplasty, surgeon Ollen Gross, M.D., assistant, Avel Peace, P.A.-C.  Anesthesia general, minimal  blood loss, Hemovac drain x1.  Tourniquet time 40 minutes at 300 mmHg.   CONSULTATIONS:  None.   BRIEF HISTORY:  [name] is a 65 year old male with a long history of  bilateral knee pain and notable degenerative changes has had arthroscopic  surgery in the past and multiple injections including Synvisc.  He is now  having pain refractory to nonoperative management and presents for a total  knee.   LABORATORY DATA:  CBC preop 15.9, hemoglobin 46.5, white count 12.7.  Differential showed neutrophils 63, lymphs 28, monos 70, esophagus 1, baso  0.  Postop H&H 13, 38.4, last noted H&H 13 and 38.7.  PT and PTT preop 12.5  and 30 respectively with an INR of 0.9.  Last noted PT and INR 18.9 and 1.9.  Chem  panel on admission all within normal limits.  Serial BMETs were  followed, sodium dropped from 136 to 130 back up to 134. Glucose went up  from 104 to 140 back down to 135.  Preop UA negative, blood group type O  positive.   EKG July 27, 2003 normal sinus rhythm, normal EKG, no significant change  since last tracing confirmed by Dr. Elease Hashimoto.  Preop chest x-ray July 25, 2003 no active disease.   HOSPITAL COURSE:  The patient was admitted to [facility], taken to  the OR, underwent the above stated procedure, given 24 hours postop IV  antibiotics in the form of Ancef, Coumadin for DVT prophylaxis, placed on  p.o. and IV analgesics. Placed weightbearing as tolerated.  PT and OT  consulted postop for gait training, ambulation and ADL's.  Hemovac drain  placed at the time of surgery was pulled on postoperative day one.  The  patient had a mild fluid balance, underwent mild diuresis, responded well.  He did have some nausea postop and was treated with antiemetics.  Nausea was  doing a little better by day two.  Pain pump placed at the time of surgery  was removed on postoperative day two, dressing changed, incision was healing  well. Continued to progress well with physical therapy and did well.  By day  two and day three up ambulating approximately 120 feet.  By day three had  been weaned over to p.o. medications, doing well and wanted to be discharged  home.   DISCHARGE MEDICATIONS AND PLANS:  1. The patient discharged home on August 03, 2003.  2. Discharge diagnoses please see above.  3. Discharge medications:     a. Mepergan Fortis.     b. Robaxin.     c. Coumadin.  4. Activity weightbearing as tolerated. Gait training, ambulation and ADL's     as per therapy.  Home health PT and home health nursing.  5. Followup in two weeks.   DISPOSITION:  To home.   CONDITION ON DISCHARGE:  Improved.     [name], P.A.              Ollen Gross, M.D.     ALP/MEDQ  D:  09/07/2003  T:  09/07/2003  Job:  [id number]   cc:   Stacie Glaze, M.D. [facility]

## 2010-11-15 NOTE — Discharge Summary (Signed)
[name], [name]             ACCOUNT NO.:  [id number]   MEDICAL RECORD NO.:  [id number]          PATIENT TYPE:  INP   LOCATION:  1431                         FACILITY:  [facility]   PHYSICIAN:  Rosalyn Gess. Norins, MD  DATE OF BIRTH:  01/12/1946   DATE OF ADMISSION:  06/17/2006  DATE OF DISCHARGE:  06/20/2006                               DISCHARGE SUMMARY   ADMITTING DIAGNOSES:  1. Lower gastrointestinal bleed.  2. Status post total knee replacement.  3. Hypertension.  4. Hiatal hernia.  5. Degenerative disk disease.  6. Acoustic tumor.   DISCHARGE DIAGNOSES:  1. Probable diverticular bleed.  2. Status post total knee replacement.  3. Hypertension.  4. Hiatal hernia.  5. Degenerative disk disease.  6. Acoustic tumor.   CONSULTANTS:  1. Dr.  Charlann Boxer for orthopedics.  2. Dr. Leone Payor for GI.   PROCEDURES:  Chest x-ray at admission that was stable.   HISTORY OF PRESENT ILLNESS:  The patient is 65 year old Caucasian  gentleman with hypertension who had a recent left TKR 2 weeks prior to  admission.  He was on Coumadin for routine DVT prophylaxis.  The patient  reports he had constipation since the time of his surgery exacerbated by  narcotics.  He took MiraLax Monday the 17th and again on the 18th  followed by an enema on the 18th.  He had a BM 15 minutes later with  large packed stool after straining.  He woke on the morning of admission  with diarrhea.  He had significant weakness, felt sick, passed out and  was incontinent of blood clots and stool.  This led to presentation to  the hospital for evaluation.   PAST MEDICAL HISTORY:   SURGICAL:  1. Tonsillectomy.  2. Colonoscopy with polypectomy.  3. Total knee arthroplasty in 2005.  4. Total knee arthroplasty on the left in 2007.  5. Open reduction internal fixation right ankle in 2002.   MEDICAL ILLNESSES:  1. OA bilateral knees  2. Hypertension.  3. Hiatal hernia with reflux.  4. History of peptic ulcer disease and  gastritis.  5. Diverticulosis.  6. UTI.  7. Yeast infection.  8. Degenerative disk disease.  9. Vertigo.  10.Acoustic tumor.   MEDICATIONS ON ADMISSION:  1. Meclizine 25 mg p.r.n.  2. Nexium 40 mg daily.  3. Mobic 15 mg daily.  4. Phenergan p.r.n.  5. HCTZ 25 mg daily.  6. Elavil 25 mg q.h.s.  7. Dilaudid from orthopedics.  8. Coumadin this directed.   FAMILY HISTORY:  Noncontributory.   SOCIAL HISTORY:  The patient lives with his wife.  He is on full  disability because of his orthopedic problems.  Please see the H&P at  admission for physical exam.   HOSPITAL COURSE:  #1.  GI:  Patient with acute melenic lower GI bleed  most likely diverticular in nature.  This was exacerbated by the patient  being on Coumadin.  Coumadin was discontinued.  The patient was  transfused 3 units total of packed red cells.  His hemoglobin  stabilized.  His last bloody stool was on the morning of the 21st  and  has had no bloody stools since that time.  He reportedly feels well and  better.  At this point, he is stable for discharge home with  instructions for close follow-up with Dr. Jarold Motto in 7-10 days.  He is  obviously to return to the hospital if he has any recurrent lower GI  bleeding.  #2.  Orthopedic. Patient with recent TKR.  At this time, he is taken off  of Coumadin prophylaxis. Orthopedics agrees with the cessation of  Coumadin.  The patient is to follow-up with Dr. Lequita Halt, an appointment  has been scheduled.  The patient may continue with Dilaudid as needed.  #3.  Leukocytosis stable.  #4.  Hypertension stable.  #5.  Dyslipidemia.  The patient to continue his home medications.   DISCHARGE EXAMINATION:  GENERAL:  The patient is awake, alert, sitting  in bed wanting to go home. Denies any pain or discomfort, reports no  bloody stools.  VITAL SIGNS:  Temperature of 98.8, blood pressure 131/60, heart rate 88,  respirations 18. O2 sat is 97% on room air.  GENERAL APPEARANCE:   This is a somewhat haggard-appearing gentleman in  no acute distress.  HEENT: Exam was unremarkable.  CHEST:  Clear with no rales, wheezes, or rhonchi.  CARDIOVASCULAR:  2+ radial pulse.  He had a quiet precordium with a  regular rate and rhythm without murmurs.  ABDOMEN:  Positive bowel sounds.  No guarding, no rebound, no  tenderness.  No further examination conducted.   FINAL LABORATORY:  CBC from the morning of the 22nd with a hemoglobin of  9.6 grams, white count 15,300. Basic metabolic panel from December 20  significant for a serum glucose of 129.   DISCHARGE MEDICATIONS:  1. Protonix 40 mg b.i.d.  2. Ambien 5 mg q.h.s.  3. Patient is to resume Crestor, amitriptyline and Dilaudid at home      doses.   FOLLOW UP:  The patient is to see Dr. Lequita Halt, the patient has a  scheduled appointment..  The patient is to see Dr. Darryll Capers in 10-14  days for follow-up.  The patient is to call and schedule an appoint with  Dr. Jarold Motto in 10-14 days.   The patient's condition at the time of discharge dictation is stable.  He is clearly instructed if he has any recurrent problem with any  gastrointestinal bleeding or discomfort, he is to return to the  emergency department.      Rosalyn Gess Norins, MD  Electronically Signed     MEN/MEDQ  D:  06/20/2006  T:  06/21/2006  Job:  [id number]   cc:   Stacie Glaze, MD  [address] [place]  [place] [address]   Ollen Gross, M.D.  Fax: [phone]   Vania Rea. Jarold Motto, MD, FACG, FACP, FAGA  [address]  [place]  [place] [address]

## 2010-11-15 NOTE — H&P (Signed)
NAMEJOHNATTAN, [name]             ACCOUNT NO.:  [id number]   MEDICAL RECORD NO.:  [id number]          PATIENT TYPE:  INP   LOCATION:  NA                           FACILITY:  [facility]   PHYSICIAN:  Ollen Gross, M.D.    DATE OF BIRTH:  1946-06-28   DATE OF ADMISSION:  06/01/2006  DATE OF DISCHARGE:                              HISTORY & PHYSICAL   DATE OF OFFICE VISIT AND HISTORY & PHYSICAL:  May 12, 2006.   CHIEF COMPLAINT:  Left knee pain.   HISTORY OF PRESENT ILLNESS:  The patient is a 66 year old male being  seen by Dr. Lequita Halt for ongoing left knee problems.  He has had a total  knee on the right done about 2-1/2 years ago.  He is doing well with  that, but his left knee has progressively gotten worse over the past  couple of years.  He had a lot of trouble and dysfunction.  He is ready  to go ahead and have the other side done.  He has known significant  osteoarthritis.  Risks and benefits discussed.  The patient will come to  the hospital.   ALLERGIES:  No known drug allergies.   INTOLERANCES:  CODEINE and SEVERAL PAIN MEDICATIONS cause nausea.   CURRENT MEDICATIONS:  Meclizine, Nexium, Amitriptyline,  hydrochlorothiazide, Crestor, Motrin, promethazine suppositories.   PAST MEDICAL HISTORY:  1. Hypertension.  2. Hiatal hernia.  3. Reflux disease.  4. History of ulcers.  5. History of gastritis.  6. Diverticulosis.  7. History of UTIs.  8. History of yeast infections.  9. Degenerative disk disease.  10.Vertigo.  11.History of an acoustic tumor.   PAST SURGICAL HISTORY:  1. Tonsillectomy.  2. Colonoscopy with polypectomy.  3. Several knee surgeries in the past.  4. Previous right total knee angioplasty in January 2005.  5. ORIF right ankle December 2002.   SOCIAL HISTORY:  Married, disabled.  Nonsmoker, no alcohol.  One child.   FAMILY HISTORY:  Father with history of heart disease.  Mother with  history of hypertension and arthritis, congestive heart  failure.   REVIEW OF SYSTEMS:  GENERAL: No fever, chills, night sweats.  NEUROLOGIC: No seizures, syncope, paralysis. RESPIRATORY: No shortness  of breath, productive cough, or hemoptysis.  CARDIOVASCULAR:  No chest  pain, orthopnea.  GI: Some intermittent nausea with vertigo and acoustic  tumor.  No vomiting, no diarrhea, no constipation.  GU: Does have a  little bit of weak stream at times.  No dysuria, hematuria.  MUSCULOSKELETAL: Left knee.   PHYSICAL EXAMINATION:  VITAL SIGNS: Pulse 72, respirations 12, blood  pressure 142/72.  GENERAL: A 65 year old white male, well-nourished, well-developed, no  acute distress.  Alert and oriented, cooperative.  HEENT: Normocephalic and atraumatic. Pupils round and reactive.  EOM  intact.  NECK: Supple.  CHEST: Clear anteriorly and posteriorly.  HEART: Regular rate and rhythm, no murmurs.  S1, S2 noted.  ABDOMEN: Soft, slightly round, nontender. Bowel sounds present.  BREASTS/RECTAL/GENITALIA: Not done, not pertinent to present illness.  EXTREMITIES:  Left knee range of motion 5 to 110, marked crepitus, no  effusion.  IMPRESSION:  1. Osteoarthritis left knee.  2. Hypertension.  3. Hiatal hernia.  4. Reflux disease.  5. History of ulcers.  6. History of gastritis.  7. Diverticulosis.  8. History of urinary tract infections.  9. History of yeast infection.  10.Degenerative disk disease.  11.Vertigo.  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   PLAN:  Patient admitted to [facility] to undergo left total  knee angioplasty.  The surgery will be performed by Dr. Ollen Gross.      [name], P.A.      Ollen Gross, M.D.  Electronically Signed    ALP/MEDQ  D:  05/31/2006  T:  06/01/2006  Job:  [id number]   cc:   Stacie Glaze, MD  [address] [place]  [place] [address]   Courtney Paris, M.D.  Fax: [phone]

## 2010-11-15 NOTE — Consult Note (Signed)
NAMEMILIK, GILREATH                ACCOUNT NO.:  [id number]   MEDICAL RECORD NO.:  [id number]          PATIENT TYPE:  INP   LOCATION:  1431                         FACILITY:  [facility]   PHYSICIAN:  Madlyn Frankel. Charlann Boxer, M.D.  DATE OF BIRTH:  07-10-1945   DATE OF CONSULTATION:  DATE OF DISCHARGE:                                 CONSULTATION   CHIEF COMPLAINT:  Rectal bleeding which started today.   HISTORY OF PRESENT ILLNESS:  This is a 65 year old male who began  bleeding today from his rectum after taking Colace and a self-  administered Fleet enema.  He was brought to the emergency department,  where he was evaluated by the emergency department physician as well as  [facility] medicine.  He is status post left total knee  replacement on June 01, 2006 by Dr. Despina Hick.  He has been on Coumadin  for DVT prophylaxis.  He does have a history of diverticulitis,  gastritis, ulcers, reflux disease, and hiatal hernia.  He states that  his knee is in no current distress and is nonpainful.  He has not been  running a fever nor has he had any chills.   PAST MEDICAL HISTORY:  1. Osteoarthritis, left knee.  2. Hypertension.  3. Hiatal hernia.  4. Reflux disease.  5. History of ulcers.  6. History of gastritis.  7. History of diverticulosis.  8. History of UTI.  9. History of yeast infection.  10.History of degenerative disk disease.  11.History of vertigo.  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   PAST SURGICAL HISTORY:  1. Tonsillectomy.  2. Colonoscopy with polypectomy.  3. Knee surgery.  4. Right total knee arthroplasty in 2005.  5. Open reduction/internal fixation, right ankle in 2002.   SOCIAL HISTORY:  He is married, disabled.   MEDICATIONS:  Meclizine, Nexium, amitriptyline, HCTZ, Crestor, Motrin,  promethazine suppositories.  Also Coumadin 5 mg for DVT prophylaxis.   REVIEW OF SYSTEMS:  He has had no acute changes of any of his  cardiovascular, respiratory, genitourinary,  neurologic, musculoskeletal  systems.  GI is as in HPI.   PHYSICAL EXAMINATION:  VITAL SIGNS:  In the emergency department he was  seen.  His temperature was 98.5.  Pulse 96, respirations 18, blood  pressure 114/68.  EXTREMITIES:  The left knee incision is intact, well healed.  No open  drainage.  No erythema.  Some mild effusion associated with his total  knee replacement.  Range of motion is -5 to 90 without pain.  No  palpable tenderness.  The calf is soft and nontender.  Dorsalis pedis  pulse is positive.  No lymphadenopathy in the left groin.  No  cellulitis.  No streaking.   ASSESSMENT/PLAN:  Status post left total knee replacement, he is stable.  Plan to continue PT/OT in the hospital as tolerated and is cleared by  medical service.   FOLLOW UP:  Alexzandrew L. Perkins, P.A. with Dr. Despina Hick will follow up  with the patient on Thursday, December 20.     ______________________________  Yetta Glassman. [address], [place]      Madlyn Frankel.  Charlann Boxer, M.D.  Electronically Signed    BLM/MEDQ  D:  06/17/2006  T:  06/17/2006  Job:  [id number]

## 2010-11-15 NOTE — Op Note (Signed)
NAMEMARLENE, [name]                ACCOUNT NO.:  [id number]   MEDICAL RECORD NO.:  [id number]          PATIENT TYPE:  INP   LOCATION:  0010                         FAC[facility]:  [facility]   PHYSICIAN:  Ollen Gross, M.D.    DATE OF BIRTH:  March 13, 1946   DATE OF PROCEDURE:  06/01/2006  DATE OF DISCHARGE:                               OPERATIVE REPORT   PREOPERATIVE DIAGNOSIS:  Osteoarthritis left knee.   POSTOPERATIVE DIAGNOSIS:  Osteoarthritis left knee.   PROCEDURE:  Left total knee arthroplasty.   SURGEON:  Ollen Gross, M.D.   ASSISTANT:  [name], P.A.-C.   ANESTHESIA:  General with postop Marcaine pain pump.   ESTIMATED BLOOD LOSS:  Minimal.   DRAINS:  Hemovac x1.   TOURNIQUET TIME:  43 minutes at 300 mmHg.   COMPLICATIONS:  None.   CONDITION:  Stable to recovery.   CLINICAL NOTE:  [name] skin incision a 65 year old male with severe  end stage arthritis of the left knee with valgus deformity.  He has  intractable pain and has failed nonoperative management including  multiple series of injections.  He presents now for a total knee  arthroplasty.   PROCEDURE IN DETAIL:  After the successful administration of general  anesthetic, a tourniquet was placed high on the left thigh and the left  lower extremity prepped and draped in the usual sterile fashion.  The  extremity was wrapped in an Esmarch, knee flexed, and tourniquet  inflated 300 mmHg.  A midline incision was made with a 10 blade through  the subcutaneous tissues to the level of the extensor mechanism.  A  fresh blade was used make a lateral parapatellar arthrotomy.  We did a  lateral approach because of his valgus deformity.  The soft tissue of  the proximal lateral tibia is then subperiosteally elevated around the  joint margin.  We then everted the patella medially, flexed the knee 90  degrees, and removed the ACL and PCL.  A drill was used to create a  starting hole in the distal femur, the  canal was thoroughly irrigated.  5 degrees left valgus alignment guide is placed and referencing off the  posterior condyles, rotations marked and the block pinned to remove 11  mm off the distal femur.  I took additional distal femur because of his  flexion contracture.  A sizing block was placed, size 5 is the most  appropriate for the femoral component.  Rotation is marked off the  epicondylar axis.  Size 5 cutting block is placed and the anterior,  posterior, and chamfer cuts made.   The tibia was subluxed forward and the menisci are removed.  The  extramedullary tibial alignment guide is placed referencing proximally  at the medial aspect of the tibial tubercle and distally along the  second metatarsal axis and tibial crest.  The block is pinned to remove  approximately 6 mm off the more deficient lateral side.  Tibial  resection is made with an oscillating saw.  Size 5 is the most  appropriate tibial component and the proximal tibia prepared with the  modular drill and keel punch for a size 5.  Femoral preparation is  completed with the intercondylar cut.   Size 5 mobile bearing tibial trial, size 5 posterior stabilized femoral  trial, and a 10 mm posterior stabilized rotating platform insert trial  were placed.  With the 10, full extension is achieved with excellent  varus and valgus balance throughout full range of motion.  The patella  was everted, thickness measured to be 25 mm, and freehand resection  taken to 14 mm.  The 41 template is placed, lug holes were drilled,  trial patella was placed and it tracks normally.  Osteophytes were  removed off the posterior femur with the trial in place.  All trials  were removed and the cut bone surfaces prepared with pulsatile lavage.  Cement was mixed and once ready for implantation,  the size 5 mobile  bearing tibial tray, size 5 posterior stabilized femur, and 41 patella  are cemented into place and the patella is held with the  clamp.  The  trial 10-mm insert was placed and the knee held in full extension and  all extruded cement removed.  Once the cement had fully hardened, then  the permanent 10 mm posterior stabilized rotating platform insert is  placed into the tibial tray.  The wound was copiously irrigated with  saline solution and the tourniquet released for total time of 43  minutes.  Minor bleeding stopped with cautery.  Lateral arthrotomy was  closed over a Hemovac drain with interrupted #1 PDS.  I left open a  small area from the superior to inferior pole of the patella to serve as  a mini-lateral release.  The patella tracks normally.  The subcu closed  with interrupted 2-0 Vicryl and subcuticular running 4-0 Monocryl.  The  drain was hooked to suction and the catheter for the Marcaine pain pump  placed and the pump subsequently initiated.  The incision was cleaned  and dried and Steri-Strips and a bulky sterile dressing applied.  He was  placed into a knee immobilizer, awakened, and transferred to recovery in  stable condition.      Ollen Gross, M.D.  Electronically Signed     FA/MEDQ  D:  06/01/2006  T:  06/01/2006  Job:  [id number]

## 2010-11-15 NOTE — Op Note (Signed)
[name], [name]                          ACCOUNT NO.:  [id number]   MEDICAL RECORD NO.:  [id number]                   PATIENT TYPE:  INP   LOCATION:  0004                                 [facility]ACILITY:  [facility]   PHYSICIAN:  Ollen Gross, M.D.                 DATE OF BIRTH:  1946-03-04   DATE OF PROCEDURE:  07/31/2003  DATE OF DISCHARGE:                                 OPERATIVE REPORT   PREOPERATIVE DIAGNOSIS:  Osteoarthritis of the right knee.   POSTOPERATIVE DIAGNOSES:  Osteoarthritis of the right knee.   OPERATION/PROCEDURE:  Right total knee arthroplasty.   SURGEON:  Ollen Gross, M.D.   ASSISTANT:  Alexzandrew L. Perkins, P.A.-C.   ANESTHESIA:  General.   ESTIMATED BLOOD LOSS:  Minimal.   DRAINS:  Hemovac x1.   COMPLICATIONS:  None.   TOURNIQUET TIME:  40 minutes at 300 mmHg.   DISPOSITION:  Stable to recovery.   BRIEF CLINICAL NOTE:  [name] is a 65 year old male with a long history  of bilateral knee pain and notable degenerative change.  He has had  arthroscopic treatment, multiple injections including Synvisc and has  continued pain which is now intractable.  He presents now for total knee  arthroplasty.   DESCRIPTION OF PROCEDURE:  After the successful administration of general  anesthesia, tourniquet was placed on the right thigh and right lower  extremity prepped and draped in the usual sterile fashion. The extremity was  wrapped in Esmarch, knee flexed, tourniquet inflated to 300 mmHg.   Standard midline incision was made with a 10-blade through the subcutaneous  tissue to the level of the extensor mechanism.  A fresh blade was used to  make a medial parapatellar arthrotomy and the soft tissue of proximal medial  tibia subperiosteally elevated to the joint line with the knife into the  semimembranosus bursa, first with curved osteotome.  Soft tissue of the  proximal lateral tibia was also elevated with attention being paid to  avoiding the patellar  tendon on the tibial tubercle.  Patella was then  everted and knee flexed 90 degrees and the ACL and PCL removed.  Drill was  used to create a starting hole at the distal femur.  The canal was  irrigated.  A five-degree right valgus alignment guide was placed.  Referencing with the posterior condyle, locations were marked and the block  pinned to remove 10 mm off the distal femur.  Distal femoral resection was  made with an oscillating saw.  Sizing block was then  placed and size 5 was  the most appropriate.  Rotation corresponded with the epicondylar axis.  The  AP cutting block was placed and the anterior and posterior cuts made.   Tibia was subluxed forward and the menisci removed.  Extra medullary tibial  alignment guide placed surfacing proximally at the medial aspect of the  tibial tubercle and distally along the second  metatarsal axis and tibial  crest.  The block is pinned to remove 10 mm of the nondeficient lateral  side.  Tibial resection was made with an oscillating saw.  A size 4 was most  appropriate tibia component and the  e proximal tibia was prepared with a  modular drill and keel punch.  Femoral preparation was then completed with  the intercondylar and chamfer cuts.   Size 4 mobile bearing tibial trial with a size 5 posterior stabilized  femoral trial and a 10 mm posterior stabilized rotating platform insert were  placed.  With the 10, full extension was achieved with excellent varus and  valgus balance throughout the full range of motion.  The patella was then  everted.  Thickness measured to 25 mm, free-hand resection taken to 13 mm.  A 41 template was placed.  Lug holes were drilled, trial patella was placed  and it tracks normally.  Osteophytes were removed off the posterior femur  with the trial in place.  All trials were removed and the cut bone surfaces  were then prepared with pulsatile lavage.  Cement was mixed and once ready  for implantation, the size 4  mobile bearing tibial tray, size 5 posterior  stabilized femur and 41 patella cemented into place and patella held with a  clamp.  The trial 10 mm trial insert was placed and the knee held in full  extension and all extruded cement removed.  Once the cement had fully  hardened and the permanent 10 mm posterior stabilized rotating platform  insert was placed.  The wounds were then copiously irrigated and the  extensor mechanism closed over a Hemovac drain with interrupted #1 PDS.  The  tourniquet released after a total time of 40 minutes.  Flexion against  gravity is about 135-140 degrees.  Subcutaneous tissues were then closed  with interrupted 2-0 Vicryl, subcuticular running 4-0 Monocryl.  Incision  was cleaned and dried and Steri-Strips and a bulky sterile dressing applied.  The catheter for the Marcaine pain pump was then placed.  He was then placed  in a bulky sterile dressing and the knee immobilizer.   I then prepped his right lateral elbow and did the lidocaine/Depo-Medrol  injection into the lateral epicondyle secondary to his severe lateral  epicondylitis.   The patient was then awakened and transported to the recovery room in stable  condition.                                               Ollen Gross, M.D.    FA/MEDQ  D:  07/31/2003  T:  07/31/2003  Job:  [id number]

## 2010-11-15 NOTE — Op Note (Signed)
[place]. [facility]  Patient:    [name], [name] Visit Number: [id number] MRN: [id number]          Service Type: MED Location: 1800 [id number] [id number] Attending Physician:  Corlis Leak. Dictated by:   Kerrin Champagne, M.D. Proc. Date: 06/11/01 Admit Date:  06/11/2001                             Operative Report  PREOPERATIVE DIAGNOSIS:  Displaced right trimalleolar ankle fracture.  POSTOPERATIVE DIAGNOSIS:  Displaced right trimalleolar ankle fracture.  PROCEDURE:  Right trimalleolar ankle fracture open reduction/internal fixation using two times 4.0 cannulated Ace partially-threaded cancellous screws to the medial malleolus and a 6-hole DCP 3.5 to the lateral malleolus.  A single lag screw through the lateral malleolar plate.  SURGEON:  Kerrin Champagne, M.D.  ASSISTANTJill Side P. Mahar, P.A.-C.  ANESTHESIA:  GOT - Dr. Gelene Mink.  ESTIMATED BLOOD LOSS:  50 cc.  DRAINS:  None.  TOURNIQUET TIME:  350 mmHg - 1 hour and 5 minutes.  BRIEF CLINICAL HISTORY:  A 65 year old male, who fell in his driveway this evening twisting his right ankle, reportedly his right knee gave way when this occurred.  Seen in the emergency room with right ankle swelling.  Radiographs demonstrated right trimalleolar ankle fracture with medial joint space widening, displacement of both medial and lateral malleolar fragments.  The posterior fragment was less than 20% of the ankle joint.  INTRAOPERATIVE FINDINGS:  The medial malleolus fracture was found to be irreducible due to periosteum interposed between the fragments medially.  The lateral malleolus fracture ______ fracture, which was reduced and treated with plate fixation with a lag screw through the DCP plate laterally.  The posterior malleolar fracture fragment appeared to reduce nicely with dorsiflexion of the foot and reduction of the lateral malleolus fracture.  DESCRIPTION OF PROCEDURE:  After adequate general  anesthesia, the right lower extremity was prepped with Duraprep solution, standard preoperative antibiotic, standard prep with Duraprep and draped in the usual manner.  C-arm fluoroscopy was brought into the field and the medial malleolar fracture was found to be irreducible so then, an incision was made directly over the medial malleolus.  An incision was also made over the superficial portion of the distal fibula.  The incision over the distal fibula approximately 15 cm in length through the skin and subcu layers directly over the superficial portion of the fibula.  The incision was carried sharply down to bone laterally. Subperiosteal dissection then carried anterior and posterior exposing the lateral aspect of the fibula.  Maintaining periosteal attachments anteriorly and posteriorly, as well as the syndesmosis distally felt to be intact.  The medial malleolar fracture fragment was noted to be irreducible with a large flap of periosteum with its base proximally interposed between the two major fragments.  The periosteum was carefully reflected proximally and a 15-blade scalpel then used to carefully free this up off of the bony surface to allow for reduction of the bony fragment distally. First, the fragment was rotated distally and irrigation performed of the right ankle joint through the medial incision.  Irrigation was performed with motion of the joint, carefully milking and debriding the joint of any loose articular fragments.  The medial malleolar fracture fragment was then reduced and held in place using reducing tenaculum and two guide pins for the 4.0 cannulated screws were then placed through the tip of the medial malleolus  oblique and parallel to the medial joint surface.  These particular pins were measured in length and they were 65 mm and a 60 mm 4.0 cannulated screw was then used to fix the medial malleolar fracture fragment, undercutting and countersinking was performed  in each and then overdrilling over the guide pins using a 2.9 drill bit.  A 16 mm 4.0 cannulated screws were then inserted without difficulty performing an excellent fixation of the medial malleolus.  Next, the lateral malleolus was then carefully reduced and held in place using a lobster claw bone holding forceps.  A temporary fixation was obtained using one of the guide pins used medially over the lateral malleolus.  This was placed through the oblique portion of the fracture.  Lobster claw could then be removed and the 6-hole DCP placed against the lateral aspect of the distal fibula.  This was then felt to require contouring distally and contouring was performed.  The plate then placed against the lateral aspect of the fibula and the lobster claw forcep then used to hold the plate carefully and ______ against the lateral surface of the fibula further reducing the fracture fragments.  With this then in place, a lag screw was then placed across the oblique fracture fragments from lateral to medial first performing a 3.5 drill hole through the superficial cortex and then placing the sleeve for the 2.9 drill bit through the 3.5 hole and drilling the deep cortex.  Measuring depth and placing the appropriate size screw.  Excellent stabilization and lagging was obtained. Next, each of the remaining five holes were then drilled in a neutral position using cortical screws to the proximal three drill holes first individually drilling with a 2.9 drill bit, measuring depth, and placing a self-tapping cortical screws.  The two distal screws were then placed using a cortical screw in the second to the last screw site and then a fully threaded cancellous 3.5 screw to the distal screw hole.  Care was taken to ensure that the most distal of the screws did not penetrate the  joint.  The second to the last screw was felt to be too long within the area of the syndesmosis distally.  This was replaced with a  20 mm screw.  It was 22 mm in length previously.  With this, permanent C-arm images were obtained, which demonstrated excellent reduction of the fracture site and anatomic position  and alignment of the right ankle fracture.  With dorsiflexion of the foot, posterior malleolar fracture fragments were felt to reduce anatomically. Irrigation was performed both medially and laterally and then the incisions were closed reapproximating the periosteal flap medially using interrupted 0 Vicryl sutures and approximating the subcu layers with interrupted 2-0 Vicryl sutures, and the skin with stainless steel staples.  The lateral incision was closed by approximating the deep subcu layers with with interrupted 0 Vicryl sutures and more superficial layers with interrupted 2-0 Vicryl sutures and the skin with stainless steel staples.  Adaptic and 4 x 4s fixed to the skin with sterile Webril, ABD pads applied.  And a well-padded posterior short-leg splint was applied to the right lower extremity using plaster-of-Paris material.  Ankle in full dorsiflexion.  The tourniquet was released.  Total tourniquet time - 1 hour and 5 minutes.  The patient was then reactivated, returned to the recovery room in satisfactory condition.  Instrument and sponge counts were correct. Dictated by:   Kerrin Champagne, M.D. Attending Physician:  Corlis Leak DD:  06/11/01  TD:  06/12/01 Job: [id number]

## 2010-11-15 NOTE — Assessment & Plan Note (Signed)
[facility]                         GASTROENTEROLOGY OFFICE NOTE   NAME:[name], [name]                       MRN:          [id number]  DATE:07/07/2006                            DOB:          1945-08-21    PROBLEM:  Post-hospital followup, GI bleed.   HISTORY OF PRESENT ILLNESS:  [name] is a pleasant 65 year old white  male who was recently hospitalized at [facility], with an  acute GI bleed.  He was admitted on June 17, 2006, through June 20, 2006.  He had undergone a knee replacement two weeks prior to that  admission, and was on Coumadin for routine deep venous thrombosis  prophylaxis.  He developed dark bloody stools with clots.  This was  associated with weakness and a syncopal episode, and he was therefore  admitted.  His Coumadin was stopped.  His anticoagulation was reversed,  and he required 3 units of packed RBCs.  He stabilized.  His hemoglobin  was stable at 9.6 at the time of discharge on June 20, 2006.  A  decision was made not to proceed with any endoscopic evaluation, as it  was felt that this was likely a diverticular bleed, exacerbated by  anticoagulation.  He has had prior colonoscopies, the last done in 2004,  with left colon diverticulosis.  Prior to that he had also had an  adenomatous polyp.  He also has chronic gastroesophageal reflux disease.   The patient has since had a follow-up hemoglobin one week ago which was  10.5, improved, and says that he did develop a superficial clot in his  left lower extremity.  He has been seen twice by Dr. Valetta Mole. Swords,  and is taking a baby aspirin currently.  He had a Doppler which did show  a clot in the superficial peroneal vein, and then a follow-up Doppler  was negative, I believe four days later.   The patient says he feels a little bit weak, but overall is otherwise  doing well and has not noted any further blood in his stools.   CURRENT MEDICATIONS:  1. Amitriptyline 125 mg q.h.s.  2. Hydrochlorothiazide 25 mg q.d.  3. Promethazine b.i.d.  4. Citrucel daily.  5. Nexium 40 mg daily.  6. Crestor one p.o. q.d.  7. Mobic p.r.n.  8. Meclizine p.r.n.   ALLERGIES:  CODEINE.   PAST MEDICAL/SURGICAL HISTORY:  1. A remote history of peptic ulcer disease.  2. 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  He said he also does have associated motion sickness      with this.  3. He has had bilateral knee replacements.  4. He has had a right ankle repair.  5. History of osteoarthritis.  6. History of hyperlipidemia.  7. History of hypertension.   SOCIAL HISTORY:  The patient is married.  He is disabled.  Has a 10th  grade education.  He is a nonsmoker, nondrinker.   FAMILY HISTORY:  Negative for colon cancer or colon polyps.   REVIEW OF SYSTEMS:  Pertinent for  arthritis symptoms.  He is currently  in physical therapy for his left knee.  Otherwise negative review of  systems.   PHYSICAL EXAMINATION:  GENERAL:  A well-developed white male, in no  acute distress.  VITAL SIGNS:  Blood pressure 124/88, height 5 feet 8 inches, weight  209.6 pounds, pulse 90.  HEENT:  Normocephalic and atraumatic.  EOMI.  Pupils equal, round,  reactive to light and accommodation.  Sclerae anicteric.  CARDIOVASCULAR:  A regular rate and rhythm with S1 and S2.  No murmur,  rub or gallop.  PULMONARY:  Clear to A&P.  ABDOMEN:  Soft.  He is minimally tender in the left lower quadrant.  There is no guarding, no rebound, no mass or hepatomegaly.  Bowel sounds  are active.  RECTAL:  Examination is not done at this time.   IMPRESSION:  33. A 65 year old male with recent acute gastrointestinal bleed, felt      to be diverticular, exacerbated by anticoagulation, post-knee      replacement.  Cannot rule out occult lesion or upper      gastrointestinal source of blood loss, though felt less likely.  2. Anemia, secondary to  number one.  3. Gastroesophageal reflux disease, chronic.   PLAN:  1. Schedule upper endoscopy and colonoscopy.  2. Continue Nexium 40 mg p.o. q.d.  3. Check repeat hemoglobin and hematocrit today.  4. Advised the patient that it should be fine for him to continue on a      baby aspirin daily.      Mike Gip, PA-C  Electronically Signed      Vania Rea. Jarold Motto, MD, Caleen Essex, FAGA  Electronically Signed   AE/MedQ  DD: 07/07/2006  DT: 07/07/2006  Job #: [id number]   cc:   Valetta Mole. Swords, MD

## 2010-11-15 NOTE — H&P (Signed)
[name], [name]                          ACCOUNT NO.:  [id number]   MEDICAL RECORD NO.:  [id number]                   PATIENT TYPE:  INP   LOCATION:  NA                                   FACILITY[facility]  [facility]   PHYSICIAN:  Ollen Gross, M.D.                 DATE OF BIRTH:  [birth year]   DATE OF ADMISSION:  07/31/2003  DATE OF DISCHARGE:                                HISTORY & PHYSICAL   CHIEF COMPLAINT:  Bilateral knee pain, right more symptomatic than left.   HISTORY OF PRESENT ILLNESS:  The patient is a 65 year old male seen by Dr.  Lequita Halt for ongoing knee pain.  He does have bilateral knee pain, although  he states the right knee is more symptomatic than the left.  It has been  ongoing for some time now.  He has been treated conservatively in the past,  even with injections, which have only helped him temporarily.  He has  reached a point now where he is in a significant amount of pain and would  like to have something done about it.  It is felt he has reached a point  where he would benefit from undergoing knee replacements.  Risks and  benefits have been discussed, and he elects to proceed with surgery.   ALLERGIES:  No known drug allergies.   INTOLERANCES:  1. CODEINE causes nausea.  2. MORPHINE causes nausea.   CURRENT MEDICATIONS:  1. Promethazine 50 mg as needed.  2. Pravachol 40 mg.  3. Zetia 10 mg.  4. Amitriptyline.  5. Tramadol.  6. Hydrochlorothiazide.  7. Nexium.   PAST MEDICAL HISTORY:  1. Hypertension.  2. Hiatal hernia.  3. Gastroesophageal reflux disease.  4. Diverticulosis.  5. Osteoarthritis.  6. He has had a previous history of postoperative nausea.  7. He also has an acoustic tumor.  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   PAST SURGICAL HISTORY:  1. Tonsillectomy.  2. Colonoscopy with polypectomies.  3. He has undergone knee surgeries in the past.  4. ORIF of the right ankle in December of 2002.   SOCIAL HISTORY:  Married.   Disabled.  Nonsmoker.  No alcohol.  One child.  His wife and daughter will be assisting with care.  He lives in a one story  home with 4 steps.   FAMILY HISTORY:  Father with a history of heart disease.  Mother with a  history of hypertension and arthritis.  Sister with a history of diabetes.   REVIEW OF SYSTEMS:  GENERAL:  No fever, chills, night sweats.  NEUROLOGIC:  He does have an acoustic tumor and vertigo secondary to the tumor.  No  syncopal episodes.  No seizures.  PULMONARY:  No shortness of breath,  productive cough, or hemoptysis.  CARDIOVASCULAR:  No chest pain, angina, or  orthopnea.  GI:  He does have chronic intermittent nausea secondary to his  vertigo  and acoustic tumor.  No vomiting.  No diarrhea or constipation.  No  blood or mucous in the stool; however, he does have a history of  diverticulosis.  GU:  He does have a complaint of weak stream.  No dysuria,  hematuria, or discharge.  MUSCULOSKELETAL:  Pertinent to the knees found in  the history of present illness.   PHYSICAL EXAMINATION:  VITAL SIGNS:  Pulse 100, respirations 16, blood  pressure 124/78.  GENERAL:  A 65 year old white male, well-nourished, well-developed, in no  acute distress.  He is alert, oriented, and cooperative.  Pleasant at the  time of exam.  HEENT:  Normocephalic and atraumatic.  Pupils are round and reactive.  Extraocular movements intact.  NECK:  Supple.  No carotid bruits are appreciated.  CHEST:  Clear anterior and posterior chest walls.  No rhonchi, rales, or  wheezing.  HEART:  Regular rate and rhythm.  Somewhat of a tachycardic rhythm and a  pulse of 100.  S1 and S2 noted.  ABDOMEN:  Soft, nontender.  Bowel sounds present.  RECTAL/BREAST/GENITALIA:  Not done, not pertinent to present illness.  EXTREMITIES:  Significant to the right knee.  Moderate crepitus is noted on  passive range of motion.  Range of motion lacking 5 degrees of full  extension with 115 degrees of flexion.  There  is no instability.  Motor is  intact.  Left knee does show also crepitus noted lacking 5 degrees of full  extension with 120 degrees of flexion.  No instability.  Motor intact.   IMPRESSION:  1. Osteoarthritis, right knee.  2. Hypertension.  3. Hiatal hernia.  4. Gastroesophageal reflux disease.  5. History of ulcers 34 years ago.  6. Diverticulosis.  7. Acoustic tumor.  8. 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   PLAN:  The patient admitted to [facility] to undergo a right total  knee arthroplasty.  Surgery will be performed by Dr. Ollen Gross.  The  patient's medical doctor is Dr. Darryll Capers.  Dr. Lovell Sheehan will be notified  of the room number on admission, and will be consulted if needed for any  medical assistance with the patient throughout the hospital course.     [name], P.A.              Ollen Gross, M.D.    ALP/MEDQ  D:  07/30/2003  T:  07/30/2003  Job:  [id number]   cc:   Stacie Glaze, M.D. [facility]

## 2010-11-22 ENCOUNTER — Ambulatory Visit: Payer: BC Managed Care – PPO | Admitting: Internal Medicine

## 2010-11-29 ENCOUNTER — Encounter: Payer: Self-pay | Admitting: Internal Medicine

## 2010-11-29 ENCOUNTER — Ambulatory Visit (INDEPENDENT_AMBULATORY_CARE_PROVIDER_SITE_OTHER): Payer: Medicare Other | Admitting: Internal Medicine

## 2010-11-29 DIAGNOSIS — E785 Hyperlipidemia, unspecified: Secondary | ICD-10-CM

## 2010-11-29 DIAGNOSIS — I10 Essential (primary) hypertension: Secondary | ICD-10-CM

## 2010-11-29 DIAGNOSIS — M1990 Unspecified osteoarthritis, unspecified site: Secondary | ICD-10-CM

## 2010-11-29 DIAGNOSIS — K5909 Other constipation: Secondary | ICD-10-CM

## 2010-11-29 DIAGNOSIS — K625 Hemorrhage of anus and rectum: Secondary | ICD-10-CM

## 2010-11-29 LAB — TSH: TSH: 1.41 u[IU]/mL (ref 0.35–5.50)

## 2010-11-29 LAB — LDL CHOLESTEROL, DIRECT: Direct LDL: 157.2 mg/dL

## 2010-11-29 MED ORDER — VALACYCLOVIR HCL 500 MG PO TABS: 500.0000 mg | ORAL_TABLET | Freq: Two times a day (BID) | ORAL | Status: DC

## 2010-11-29 MED ORDER — MELOXICAM 15 MG PO TABS: 15.0000 mg | ORAL_TABLET | Freq: Every day | ORAL | Status: DC

## 2010-11-29 NOTE — Progress Notes (Signed)
Addended by: Willy Eddy on: 11/29/2010 12:28 PM   Modules accepted: Orders

## 2010-11-29 NOTE — Progress Notes (Signed)
  Subjective:    Patient ID: [name], male    DOB: 12-18-1945, 65 y.o.   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  He had a recent lower GI bleed from bleeding hemorrhoids felt was secondary to use of aspirin and meloxicam together.  He has been on Celebrex recently without any problems but cost of medication is prohibitive.  Blood pressure is well controlled at this time his nausea is controlled both with knowledge of the actions that cause his dizziness as well as the use of anti-emetics    Review of Systems  Constitutional: Negative for fever and fatigue.  HENT: Negative for hearing loss, congestion, neck pain and postnasal drip.   Eyes: Negative for discharge, redness and visual disturbance.  Respiratory: Negative for cough, shortness of breath and wheezing.   Cardiovascular: Negative for leg swelling.  Gastrointestinal: Negative for abdominal pain, constipation and abdominal distention.  Genitourinary: Negative for urgency and frequency.  Musculoskeletal: Negative for joint swelling and arthralgias.  Skin: Negative for color change and rash.  Neurological: Negative for weakness and light-headedness.  Hematological: Negative for adenopathy.  Psychiatric/Behavioral: Negative for behavioral problems.   Past Medical History  Diagnosis Date  . HYPERLIPIDEMIA 12/24/2006  . HYPERTENSION 12/24/2006  . ALLERGIC RHINITIS CAUSE UNSPECIFIED 05/27/2010  . GERD 12/24/2006  . DIVERTICULOSIS, COLON 03/09/2007  . Other constipation 11/06/2009  . PROSTATITIS, RECURRENT 12/30/2007  . OSTEOARTHRITIS 12/24/2006  . SHOULDER PAIN, BILATERAL 07/16/2007  . Cervicalgia 05/27/2010  . BACK PAIN, UPPER 02/21/2010  . DIZZINESS, CHRONIC 03/09/2007  . INSOMNIA WITH SLEEP APNEA UNSPECIFIED 05/27/2010  . HEADACHE, SINUS 04/06/2009  . CHEST PAIN UNSPECIFIED 04/25/2009  . Open wound of wrist, complicated 10/13/2007  .  ADVEF, DRUG/MEDICINAL/BIOLOGICAL SUBST NOS 03/09/2007  . TRANSIENT ISCHEMIC ATTACKS, HX OF 04/06/2009   Past Surgical History  Procedure Date  . Replacement total knee bilateral 2005/2007  . Ankle arthroscopy w/ open repair 2002    Repair  . Tonsillectomy     reports that he quit smoking about 35 years ago. He does not have any smokeless tobacco history on file. He reports that he does not drink alcohol or use illicit drugs. family history includes Achalasia in his father; Depression in his father; Diabetes in his sister; Heart disease (age of onset:36) in his brother; and Kidney failure in his mother. Allergies  Allergen Reactions  . Codeine     REACTION: Nausea       Objective:   Physical Exam  Constitutional: He appears well-developed and well-nourished.  HENT:  Head: Normocephalic and atraumatic.  Eyes: Conjunctivae are normal. Pupils are equal, round, and reactive to light.  Neck: Normal range of motion. Neck supple.  Cardiovascular: Normal rate and regular rhythm.   Pulmonary/Chest: Effort normal and breath sounds normal.  Abdominal: Soft. Bowel sounds are normal.  Musculoskeletal: He exhibits edema and tenderness.          Assessment & Plan:  In the past the patient has had a lower GI bleed it was associated with meloxicam use he however was on aspirin at that time he has taken Celebrex safely in the interval since he was last seen and wishes to resume meloxicam without the aspirin.  The pt's  GI symptoms were better controlled with aciphex but his plan  Requires nexium Controlled dizziness monitoring lipids due

## 2011-03-12 ENCOUNTER — Other Ambulatory Visit: Payer: Self-pay | Admitting: Internal Medicine

## 2011-03-13 ENCOUNTER — Encounter: Payer: Self-pay | Admitting: Family Medicine

## 2011-03-13 ENCOUNTER — Ambulatory Visit (INDEPENDENT_AMBULATORY_CARE_PROVIDER_SITE_OTHER): Payer: Medicare Other | Admitting: Family Medicine

## 2011-03-13 VITALS — BP 120/80 | Temp 98.5°F | Wt 215.0 lb

## 2011-03-13 DIAGNOSIS — L03312 Cellulitis of back [any part except buttock]: Secondary | ICD-10-CM

## 2011-03-13 DIAGNOSIS — L02219 Cutaneous abscess of trunk, unspecified: Secondary | ICD-10-CM

## 2011-03-13 DIAGNOSIS — K219 Gastro-esophageal reflux disease without esophagitis: Secondary | ICD-10-CM

## 2011-03-13 MED ORDER — OMEPRAZOLE 40 MG PO CPDR: 40.0000 mg | DELAYED_RELEASE_CAPSULE | Freq: Every day | ORAL | Status: DC

## 2011-03-13 MED ORDER — DOXYCYCLINE HYCLATE 100 MG PO CAPS: 100.0000 mg | ORAL_CAPSULE | Freq: Two times a day (BID) | ORAL | Status: AC

## 2011-03-13 NOTE — Progress Notes (Signed)
  Subjective:    Patient ID: [name], male    DOB: Jun 05, 1946, 65 y.o.   MRN: [id number]  HPI Painful cystic area mid back lower thoracic region. Inflammation similar location last year and resolved with antibiotics. No drainage. No history of trauma. Denies fever or chills. Pain is mild.  History of GERD. Patient requesting change from Nexium to omeprazole for insurance purposes. GERD fairly well-controlled.   Review of Systems  Constitutional: Negative for fever and chills.  Respiratory: Negative for cough and shortness of breath.   Gastrointestinal: Negative for abdominal pain.  Skin: Positive for rash.       Objective:   Physical Exam  Constitutional: He appears well-developed and well-nourished. No distress.  Cardiovascular: Normal rate and regular rhythm.   Pulmonary/Chest: Effort normal and breath sounds normal. No respiratory distress. He has no wheezes. He has no rales.  Skin:       Area of minimal erythema near midline lower thoracic region. He has been full consistent with epidermal cyst. Minimal edema but no fluctuance. Minimal warmth.          Assessment & Plan:  Sebaceous cyst. Evidence of probably very early inflammation and infection. No fluctuance. Start doxycycline 100 mg twice a day and warm compresses. If progresses may need incision and drainage but this resolved previously with conservative therapy  GERD. Change Nexium to omeprazole 40 mg daily per patient request

## 2011-03-13 NOTE — Patient Instructions (Signed)
Warm compresses several times daily Follow up for increased redness, pain, swelling, or increased fluctuance.

## 2011-03-17 ENCOUNTER — Ambulatory Visit: Payer: Medicare Other | Admitting: Internal Medicine

## 2011-04-30 ENCOUNTER — Encounter: Payer: Self-pay | Admitting: Internal Medicine

## 2011-04-30 ENCOUNTER — Encounter (INDEPENDENT_AMBULATORY_CARE_PROVIDER_SITE_OTHER): Payer: Medicare Other | Admitting: Internal Medicine

## 2011-04-30 VITALS — BP 140/80 | HR 76 | Temp 98.4°F | Resp 16 | Ht 68.0 in | Wt 212.0 lb

## 2011-04-30 DIAGNOSIS — E785 Hyperlipidemia, unspecified: Secondary | ICD-10-CM

## 2011-04-30 DIAGNOSIS — Z23 Encounter for immunization: Secondary | ICD-10-CM

## 2011-04-30 MED ORDER — ROSUVASTATIN CALCIUM 20 MG PO TABS: 20.0000 mg | ORAL_TABLET | ORAL | Status: DC

## 2011-04-30 MED ORDER — MELOXICAM 15 MG PO TABS: 15.0000 mg | ORAL_TABLET | Freq: Every day | ORAL | Status: DC

## 2011-04-30 NOTE — Patient Instructions (Signed)
Patient was instructed to continue all medications as prescribed. To stop at the checkout desk and schedule a followup appointment  

## 2011-06-28 ENCOUNTER — Other Ambulatory Visit: Payer: Self-pay | Admitting: Internal Medicine

## 2011-08-01 ENCOUNTER — Encounter: Payer: Self-pay | Admitting: Internal Medicine

## 2011-08-01 ENCOUNTER — Ambulatory Visit (INDEPENDENT_AMBULATORY_CARE_PROVIDER_SITE_OTHER): Payer: Medicare Other | Admitting: Internal Medicine

## 2011-08-01 VITALS — BP 150/90 | HR 76 | Temp 98.2°F | Resp 16 | Ht 68.0 in | Wt 212.0 lb

## 2011-08-01 DIAGNOSIS — R42 Dizziness and giddiness: Secondary | ICD-10-CM

## 2011-08-01 DIAGNOSIS — E785 Hyperlipidemia, unspecified: Secondary | ICD-10-CM

## 2011-08-01 DIAGNOSIS — IMO0001 Reserved for inherently not codable concepts without codable children: Secondary | ICD-10-CM

## 2011-08-01 DIAGNOSIS — M791 Myalgia, unspecified site: Secondary | ICD-10-CM

## 2011-08-01 MED ORDER — PROMETHAZINE HCL 50 MG PO TABS: 50.0000 mg | ORAL_TABLET | ORAL | Status: DC | PRN

## 2011-08-01 MED ORDER — CILIDINIUM-CHLORDIAZEPOXIDE 2.5-5 MG PO CAPS: 1.0000 | ORAL_CAPSULE | Freq: Three times a day (TID) | ORAL | Status: DC | PRN

## 2011-08-01 MED ORDER — PROMETHAZINE HCL 25 MG RE SUPP: 25.0000 mg | Freq: Four times a day (QID) | RECTAL | Status: DC | PRN

## 2011-08-01 NOTE — Progress Notes (Signed)
Subjective:    Patient ID: [name], male    DOB: 1946-06-28, 66 y.o.   MRN: [id number]  HPI patient presents for followup of multiple medical problems including hyperlipidemia hypertension and chronic dizziness and vertigo.  He states that his myalgias have significantly improved since he discontinued the daily statin without pulsatile statin he is tolerating the medication.  His dizziness is stable without exacerbation he has other problems including his chronic pain is stable    Review of Systems  Constitutional: Negative for fever and fatigue.  HENT: Negative for hearing loss, congestion, neck pain and postnasal drip.   Eyes: Negative for discharge, redness and visual disturbance.  Respiratory: Negative for cough, shortness of breath and wheezing.   Cardiovascular: Negative for leg swelling.  Gastrointestinal: Negative for abdominal pain, constipation and abdominal distention.  Genitourinary: Negative for urgency and frequency.  Musculoskeletal: Negative for joint swelling and arthralgias.  Skin: Negative for color change and rash.  Neurological: Negative for weakness and light-headedness.  Hematological: Negative for adenopathy.  Psychiatric/Behavioral: Negative for behavioral problems.   Past Medical History  Diagnosis Date  . HYPERLIPIDEMIA 12/24/2006  . HYPERTENSION 12/24/2006  . ALLERGIC RHINITIS CAUSE UNSPECIFIED 05/27/2010  . GERD 12/24/2006  . DIVERTICULOSIS, COLON 03/09/2007  . Other constipation 11/06/2009  . PROSTATITIS, RECURRENT 12/30/2007  . OSTEOARTHRITIS 12/24/2006  . SHOULDER PAIN, BILATERAL 07/16/2007  . Cervicalgia 05/27/2010  . BACK PAIN, UPPER 02/21/2010  . DIZZINESS, CHRONIC 03/09/2007  . INSOMNIA WITH SLEEP APNEA UNSPECIFIED 05/27/2010  . HEADACHE, SINUS 04/06/2009  . CHEST PAIN UNSPECIFIED 04/25/2009  . Open wound of wrist, complicated 10/13/2007  . ADVEF, DRUG/MEDICINAL/BIOLOGICAL SUBST NOS 03/09/2007  . TRANSIENT ISCHEMIC ATTACKS, HX OF 04/06/2009     History   Social History  . Marital Status: Married    Spouse Name: N/A    Number of Children: N/A  . Years of Education: N/A   Occupational History  . Retired     Disabled   Social History Main Topics  . Smoking status: Former Smoker    Quit date: 08/15/1975  . Smokeless tobacco: Not on file   Comment: quit 35 years ago  . Alcohol Use: No  . Drug Use: No  . Sexually Active: Yes   Other Topics Concern  . Not on file   Social History Narrative  . No narrative on file    Past Surgical History  Procedure Date  . Replacement total knee bilateral 2005/2007  . Ankle arthroscopy w/ open repair 2002    Repair  . Tonsillectomy     Family History  Problem Relation Age of Onset  . Achalasia Father   . Depression Father   . Heart disease Brother 20  . Kidney failure Mother   . Diabetes Sister     Allergies  Allergen Reactions  . Codeine     REACTION: Nausea    Current Outpatient Prescriptions on File Prior to Visit  Medication Sig Dispense Refill  . amitriptyline (ELAVIL) 25 MG tablet TAKE FIVE TABLETS BY MOUTH AT BEDTIME  450 tablet  3  . BENICAR HCT 20-12.5 MG per tablet TAKE ONE TABLET BY MOUTH EVERY DAY  30 each  5  . docusate sodium (COLACE) 100 MG capsule Take 100 mg by mouth nightly.        . Lactobacillus (ACIDOPHILUS EXTRA STRENGTH) CAPS Take by mouth daily.        Marland Kitchen loratadine (CLARITIN) 10 MG tablet Take 10 mg by mouth daily.        Marland Kitchen  meclizine (ANTIVERT) 12.5 MG tablet TAKE TWO TABLETS BY MOUTH EVERY 6 HOURS AS NEEDED  180 tablet  3  . meloxicam (MOBIC) 15 MG tablet Take 1 tablet (15 mg total) by mouth daily.  90 tablet  3  . omeprazole (PRILOSEC) 40 MG capsule Take 1 capsule (40 mg total) by mouth daily.  90 capsule  3  . Probiotic Product (PHILLIPS COLON HEALTH) CAPS Take by mouth 2 (two) times daily.        . promethazine (PHENERGAN) 25 MG tablet TAKE TWO TABLETS BY MOUTH EVERY 4 HOURS AS NEEDED  360 tablet  3  . psyllium 0.52 G capsule Take  0.52 g by mouth as needed.        . rosuvastatin (CRESTOR) 20 MG tablet Take 1 tablet (20 mg total) by mouth 2 (two) times a week.  30 tablet  11  . traMADol (ULTRAM) 50 MG tablet TAKE ONE TABLET BY MOUTH EVERY 4 TO 6 HOURS AS NEEDED FOR PAIN  180 tablet  3  . valACYclovir (VALTREX) 500 MG tablet Take 1 tablet (500 mg total) by mouth 2 (two) times daily.  30 tablet  3    BP 150/90  Pulse 76  Temp 98.2 F (36.8 C)  Resp 16  Ht 5\' 8"  (1.727 m)  Wt 212 lb (96.163 kg)  BMI 32.23 kg/m2       Objective:   Physical Exam  Nursing note and vitals reviewed. Constitutional: He appears well-developed and well-nourished.  HENT:  Head: Normocephalic and atraumatic.  Eyes: Conjunctivae are normal. Pupils are equal, round, and reactive to light.  Neck: Normal range of motion. Neck supple.  Cardiovascular: Normal rate and regular rhythm.   Pulmonary/Chest: Effort normal and breath sounds normal.  Abdominal: Soft. Bowel sounds are normal.  Psychiatric: He has a normal mood and affect. His behavior is normal.          Assessment & Plan:  The patient has chronic dizziness/vertigo He is stable his current medications.  His myalgias have improved since we took him off the daily statin and put him on pulse statin with Crestor twice weekly.  He is due for complete physical examination at which time we'll monitor his lipid values of lymphocytes at that time.  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

## 2011-08-01 NOTE — Patient Instructions (Signed)
CPX 

## 2011-10-01 NOTE — Progress Notes (Signed)
  Subjective:    Patient ID: [name], male    DOB: 1946/02/28, 66 y.o.   MRN: [id number]  HPI opened in error  Review of Systems     Objective:   Physical Exam        Assessment & Plan:

## 2011-11-25 ENCOUNTER — Other Ambulatory Visit: Payer: Medicare Other

## 2011-11-27 ENCOUNTER — Other Ambulatory Visit (INDEPENDENT_AMBULATORY_CARE_PROVIDER_SITE_OTHER): Payer: Medicare Other

## 2011-11-27 DIAGNOSIS — E785 Hyperlipidemia, unspecified: Secondary | ICD-10-CM

## 2011-11-27 DIAGNOSIS — I10 Essential (primary) hypertension: Secondary | ICD-10-CM

## 2011-11-27 DIAGNOSIS — Z125 Encounter for screening for malignant neoplasm of prostate: Secondary | ICD-10-CM

## 2011-11-27 DIAGNOSIS — Z0000 Encounter for general adult medical examination without abnormal findings: Secondary | ICD-10-CM

## 2011-11-27 LAB — LIPID PANEL
Cholesterol: 259 mg/dL — ABNORMAL HIGH (ref 0–200)
Total CHOL/HDL Ratio: 6
Triglycerides: 177 mg/dL — ABNORMAL HIGH (ref 0.0–149.0)

## 2011-11-27 LAB — CBC WITH DIFFERENTIAL/PLATELET
Basophils Relative: 0.3 % (ref 0.0–3.0)
Eosinophils Absolute: 0.1 10*3/uL (ref 0.0–0.7)
Eosinophils Relative: 1.1 % (ref 0.0–5.0)
Hemoglobin: 14.4 g/dL (ref 13.0–17.0)
MCHC: 32.8 g/dL (ref 30.0–36.0)
MCV: 95.2 fl (ref 78.0–100.0)
Monocytes Absolute: 0.8 10*3/uL (ref 0.1–1.0)
Neutro Abs: 7.8 10*3/uL — ABNORMAL HIGH (ref 1.4–7.7)
Neutrophils Relative %: 68.1 % (ref 43.0–77.0)
RBC: 4.61 Mil/uL (ref 4.22–5.81)
WBC: 11.4 10*3/uL — ABNORMAL HIGH (ref 4.5–10.5)

## 2011-11-27 LAB — LDL CHOLESTEROL, DIRECT: Direct LDL: 175.1 mg/dL

## 2011-11-27 LAB — POCT URINALYSIS DIPSTICK
Bilirubin, UA: NEGATIVE
Glucose, UA: NEGATIVE
Ketones, UA: NEGATIVE
Leukocytes, UA: NEGATIVE
Nitrite, UA: NEGATIVE
pH, UA: 7

## 2011-11-27 LAB — HEPATIC FUNCTION PANEL
ALT: 19 U/L (ref 0–53)
Albumin: 3.9 g/dL (ref 3.5–5.2)
Alkaline Phosphatase: 53 U/L (ref 39–117)
Bilirubin, Direct: 0.1 mg/dL (ref 0.0–0.3)
Total Protein: 6.4 g/dL (ref 6.0–8.3)

## 2011-11-27 LAB — BASIC METABOLIC PANEL
CO2: 28 mEq/L (ref 19–32)
Chloride: 101 mEq/L (ref 96–112)
Potassium: 4.4 mEq/L (ref 3.5–5.1)
Sodium: 136 mEq/L (ref 135–145)

## 2011-11-27 LAB — PSA: PSA: 1.33 ng/mL (ref 0.10–4.00)

## 2011-12-02 ENCOUNTER — Ambulatory Visit (INDEPENDENT_AMBULATORY_CARE_PROVIDER_SITE_OTHER): Payer: Medicare Other | Admitting: Internal Medicine

## 2011-12-02 ENCOUNTER — Encounter: Payer: Self-pay | Admitting: Internal Medicine

## 2011-12-02 VITALS — BP 130/78 | HR 76 | Temp 98.2°F | Resp 16 | Ht 68.0 in | Wt 208.0 lb

## 2011-12-02 DIAGNOSIS — I10 Essential (primary) hypertension: Secondary | ICD-10-CM

## 2011-12-02 DIAGNOSIS — L0390 Cellulitis, unspecified: Secondary | ICD-10-CM

## 2011-12-02 DIAGNOSIS — Z0000 Encounter for general adult medical examination without abnormal findings: Secondary | ICD-10-CM

## 2011-12-02 DIAGNOSIS — N411 Chronic prostatitis: Secondary | ICD-10-CM

## 2011-12-02 DIAGNOSIS — L0291 Cutaneous abscess, unspecified: Secondary | ICD-10-CM

## 2011-12-02 MED ORDER — LEVOFLOXACIN 500 MG PO TABS: 500.0000 mg | ORAL_TABLET | Freq: Every day | ORAL | Status: AC

## 2011-12-02 MED ORDER — OLMESARTAN MEDOXOMIL-HCTZ 40-25 MG PO TABS
1.0000 | ORAL_TABLET | Freq: Every day | ORAL | Status: DC
Start: 2011-12-02 — End: 2012-09-04

## 2011-12-02 NOTE — Patient Instructions (Signed)
Break the benicar in 1/2 for savings

## 2011-12-02 NOTE — Progress Notes (Signed)
Subjective:    Patient ID: [name], male    DOB: [birth year], 66 y.o.   MRN: [id number]  HPI CPX [facility] medicare The patient has been missing doses of medications ( the 20 mg of creator twice a week) For cost considerations we will used the higher dose of benicar and cut the pill in 1/2 He has an infected cyst on his back He has persistent sinusitis He has mild prostate symptoms of burning and frequency  Review of Systems  Constitutional: Negative for fever and fatigue.       Dizzy and occasionally lightheaded  HENT: Negative for hearing loss, congestion, neck pain and postnasal drip.   Eyes: Negative for discharge, redness and visual disturbance.  Respiratory: Negative for cough, shortness of breath and wheezing.   Cardiovascular: Negative for leg swelling.  Gastrointestinal: Negative for abdominal pain, constipation and abdominal distention.  Genitourinary: Negative for urgency and frequency.  Musculoskeletal: Negative for joint swelling and arthralgias.  Skin: Negative for color change and rash.       Cyst on back that is painful and swollen  Neurological: Positive for dizziness. Negative for weakness and light-headedness.  Hematological: Negative for adenopathy.  Psychiatric/Behavioral: Negative for behavioral problems.   Past Medical History  Diagnosis Date  . HYPERLIPIDEMIA 12/24/2006  . HYPERTENSION 12/24/2006  . ALLERGIC RHINITIS CAUSE UNSPECIFIED 05/27/2010  . GERD 12/24/2006  . DIVERTICULOSIS, COLON 03/09/2007  . Other constipation 11/06/2009  . PROSTATITIS, RECURRENT 12/30/2007  . OSTEOARTHRITIS 12/24/2006  . SHOULDER PAIN, BILATERAL 07/16/2007  . Cervicalgia 05/27/2010  . BACK PAIN, UPPER 02/21/2010  . DIZZINESS, CHRONIC 03/09/2007  . INSOMNIA WITH SLEEP APNEA UNSPECIFIED 05/27/2010  . HEADACHE, SINUS 04/06/2009  . CHEST PAIN UNSPECIFIED 04/25/2009  . Open wound of wrist, complicated 10/13/2007  . ADVEF, DRUG/MEDICINAL/BIOLOGICAL SUBST NOS 03/09/2007  . TRANSIENT ISCHEMIC  ATTACKS, HX OF 04/06/2009    History   Social History  . Marital Status: Married    Spouse Name: N/A    Number of Children: N/A  . Years of Education: N/A   Occupational History  . Retired     Disabled   Social History Main Topics  . Smoking status: Former Smoker    Quit date: 08/15/1975  . Smokeless tobacco: Not on file   Comment: quit 35 years ago  . Alcohol Use: No  . Drug Use: No  . Sexually Active: Yes   Other Topics Concern  . Not on file   Social History Narrative  . No narrative on file    Past Surgical History  Procedure Date  . Replacement total knee bilateral 2005/2007  . Ankle arthroscopy w/ open repair 2002    Repair  . Tonsillectomy     Family History  Problem Relation Age of Onset  . Achalasia Father   . Depression Father   . Heart disease Brother 13  . Kidney failure Mother   . Diabetes Sister     Allergies  Allergen Reactions  . Codeine     REACTION: Nausea    Current Outpatient Prescriptions on File Prior to Visit  Medication Sig Dispense Refill  . amitriptyline (ELAVIL) 25 MG tablet TAKE FIVE TABLETS BY MOUTH AT BEDTIME  450 tablet  3  . clidinium-chlordiazePOXIDE (LIBRAX) 2.5-5 MG per capsule Take 1 capsule by mouth 3 (three) times daily as needed.  90 capsule  6  . docusate sodium (COLACE) 100 MG capsule Take 100 mg by mouth nightly.        . Lactobacillus (ACIDOPHILUS  EXTRA STRENGTH) CAPS Take by mouth daily.        Marland Kitchen loratadine (CLARITIN) 10 MG tablet Take 10 mg by mouth daily.        . meclizine (ANTIVERT) 12.5 MG tablet TAKE TWO TABLETS BY MOUTH EVERY 6 HOURS AS NEEDED  180 tablet  3  . meloxicam (MOBIC) 15 MG tablet Take 1 tablet (15 mg total) by mouth daily.  90 tablet  3  . omeprazole (PRILOSEC) 40 MG capsule Take 1 capsule (40 mg total) by mouth daily.  90 capsule  3  . Probiotic Product (PHILLIPS COLON HEALTH) CAPS Take by mouth 2 (two) times daily.        . promethazine (PHENERGAN) 25 MG suppository Place 1 suppository (25  mg total) rectally every 6 (six) hours as needed.  12 each  6  . promethazine (PHENERGAN) 25 MG tablet TAKE TWO TABLETS BY MOUTH EVERY 4 HOURS AS NEEDED  360 tablet  3  . promethazine (PHENERGAN) 50 MG tablet Take 1 tablet (50 mg total) by mouth every 4 (four) hours as needed.  30 tablet  6  . rosuvastatin (CRESTOR) 20 MG tablet Take 1 tablet (20 mg total) by mouth 2 (two) times a week.  30 tablet  11  . traMADol (ULTRAM) 50 MG tablet TAKE ONE TABLET BY MOUTH EVERY 4 TO 6 HOURS AS NEEDED FOR PAIN  180 tablet  3  . valACYclovir (VALTREX) 500 MG tablet Take 1 tablet (500 mg total) by mouth 2 (two) times daily.  30 tablet  3    BP 130/78  Pulse 76  Temp 98.2 F (36.8 C)  Resp 16  Ht 5\' 8"  (1.727 m)  Wt 208 lb (94.348 kg)  BMI 31.63 kg/m2       Objective:   Physical Exam  Nursing note and vitals reviewed. Constitutional: He appears well-developed and well-nourished.  HENT:  Head: Normocephalic and atraumatic.  Eyes: Conjunctivae are normal. Pupils are equal, round, and reactive to light.  Neck: Normal range of motion. Neck supple.  Cardiovascular: Normal rate and regular rhythm.   Pulmonary/Chest: Effort normal and breath sounds normal.  Abdominal: Soft. Bowel sounds are normal.  Genitourinary:       Boggy swollen prostate  Musculoskeletal: He exhibits edema and tenderness.  Skin:       Fluctuant 3 cm sebaceous cyst with local cellulitis in mid back  Psychiatric: He has a normal mood and affect. His behavior is normal.          Assessment & Plan:   Patient presents for yearly preventative medicine examination.   all immunizations and health maintenance protocols were reviewed with the patient and they are up to date with these protocols.   screening laboratory values were reviewed with the patient including screening of hyperlipidemia PSA renal function and hepatic function.   There medications past medical history social history problem list and allergies were reviewed  in detail.   Goals were established with regard to weight loss exercise diet in compliance with medications   He has an elevated white count is probably multifactorial 1 is the infected prostate for which she'll need at least 14 days of antibiotic the second is the skin infection with a markedly erythematous tender and swollen sebaceous cyst in his mid back. To treat both the skin and the back I would use Levaquin 500 mg by mouth daily for 14 days  Recommended a continuous probiotics We'll set him up for cyst excision approximately 6-8 weeks

## 2012-03-02 ENCOUNTER — Ambulatory Visit: Payer: Medicare Other | Admitting: Family Medicine

## 2012-03-11 ENCOUNTER — Encounter: Payer: Self-pay | Admitting: Internal Medicine

## 2012-03-11 ENCOUNTER — Ambulatory Visit (INDEPENDENT_AMBULATORY_CARE_PROVIDER_SITE_OTHER): Payer: Medicare Other | Admitting: Internal Medicine

## 2012-03-11 VITALS — BP 130/76 | HR 72 | Temp 98.2°F | Resp 16 | Ht 68.0 in | Wt 208.0 lb

## 2012-03-11 DIAGNOSIS — L089 Local infection of the skin and subcutaneous tissue, unspecified: Secondary | ICD-10-CM

## 2012-03-11 DIAGNOSIS — L723 Sebaceous cyst: Secondary | ICD-10-CM | POA: Diagnosis not present

## 2012-03-11 DIAGNOSIS — R110 Nausea: Secondary | ICD-10-CM | POA: Diagnosis not present

## 2012-03-11 MED ORDER — VALACYCLOVIR HCL 500 MG PO TABS: 500.0000 mg | ORAL_TABLET | Freq: Two times a day (BID) | ORAL | Status: DC

## 2012-03-11 MED ORDER — PROMETHAZINE HCL 50 MG PO TABS: 50.0000 mg | ORAL_TABLET | ORAL | Status: DC | PRN

## 2012-03-11 NOTE — Progress Notes (Signed)
Subjective:    Patient ID: [name], male    DOB: August 14, 1945, 66 y.o.   MRN: [id number]  HPI Patient refused flu vaccination.  Patient presents for infected space systems back for examination possible removal.  He took a course of Keflex 500 mg 4 times a day for 7 days and the sebaceous cyst has completely resolved.  We discussed the fact that surgical removal of a cyst that has completely resolved would be inappropriate. We discussed monitoring the fact that the cyst is completely resolve shows an effective treatment was the antibiotic.  His blood pressure stable his current nausea vascular for refill on his ferritin.     Review of Systems  Constitutional: Negative for fever and fatigue.  HENT: Negative for hearing loss, congestion, neck pain and postnasal drip.   Eyes: Negative for discharge, redness and visual disturbance.  Respiratory: Negative for cough, shortness of breath and wheezing.   Cardiovascular: Negative for leg swelling.  Gastrointestinal: Negative for abdominal pain, constipation and abdominal distention.  Genitourinary: Negative for urgency and frequency.  Musculoskeletal: Negative for joint swelling and arthralgias.  Skin: Negative for color change and rash.  Neurological: Negative for weakness and light-headedness.  Hematological: Negative for adenopathy.  Psychiatric/Behavioral: Negative for behavioral problems.   Past Medical History  Diagnosis Date  . HYPERLIPIDEMIA 12/24/2006  . HYPERTENSION 12/24/2006  . ALLERGIC RHINITIS CAUSE UNSPECIFIED 05/27/2010  . GERD 12/24/2006  . DIVERTICULOSIS, COLON 03/09/2007  . Other constipation 11/06/2009  . PROSTATITIS, RECURRENT 12/30/2007  . OSTEOARTHRITIS 12/24/2006  . SHOULDER PAIN, BILATERAL 07/16/2007  . Cervicalgia 05/27/2010  . BACK PAIN, UPPER 02/21/2010  . DIZZINESS, CHRONIC 03/09/2007  . INSOMNIA WITH SLEEP APNEA UNSPECIFIED 05/27/2010  . HEADACHE, SINUS 04/06/2009  . CHEST PAIN UNSPECIFIED 04/25/2009  . Open  wound of wrist, complicated 10/13/2007  . ADVEF, DRUG/MEDICINAL/BIOLOGICAL SUBST NOS 03/09/2007  . TRANSIENT ISCHEMIC ATTACKS, HX OF 04/06/2009    History   Social History  . Marital Status: Married    Spouse Name: N/A    Number of Children: N/A  . Years of Education: N/A   Occupational History  . Retired     Disabled   Social History Main Topics  . Smoking status: Former Smoker    Quit date: 08/15/1975  . Smokeless tobacco: Not on file   Comment: quit 35 years ago  . Alcohol Use: No  . Drug Use: No  . Sexually Active: Yes   Other Topics Concern  . Not on file   Social History Narrative  . No narrative on file    Past Surgical History  Procedure Date  . Replacement total knee bilateral 2005/2007  . Ankle arthroscopy w/ open repair 2002    Repair  . Tonsillectomy     Family History  Problem Relation Age of Onset  . Achalasia Father   . Depression Father   . Heart disease Brother 43  . Kidney failure Mother   . Diabetes Sister     Allergies  Allergen Reactions  . Codeine     REACTION: Nausea    Current Outpatient Prescriptions on File Prior to Visit  Medication Sig Dispense Refill  . amitriptyline (ELAVIL) 25 MG tablet TAKE FIVE TABLETS BY MOUTH AT BEDTIME  450 tablet  3  . clidinium-chlordiazePOXIDE (LIBRAX) 2.5-5 MG per capsule Take 1 capsule by mouth 3 (three) times daily as needed.  90 capsule  6  . docusate sodium (COLACE) 100 MG capsule Take 100 mg by mouth nightly.        Marland Kitchen  Lactobacillus (ACIDOPHILUS EXTRA STRENGTH) CAPS Take by mouth daily.        Marland Kitchen loratadine (CLARITIN) 10 MG tablet Take 10 mg by mouth daily.        . meclizine (ANTIVERT) 12.5 MG tablet TAKE TWO TABLETS BY MOUTH EVERY 6 HOURS AS NEEDED  180 tablet  3  . meloxicam (MOBIC) 15 MG tablet Take 1 tablet (15 mg total) by mouth daily.  90 tablet  3  . olmesartan-hydrochlorothiazide (BENICAR HCT) 40-25 MG per tablet Take 1 tablet by mouth daily.  30 tablet  11  . omeprazole (PRILOSEC) 40 MG  capsule Take 1 capsule (40 mg total) by mouth daily.  90 capsule  3  . Probiotic Product (PHILLIPS COLON HEALTH) CAPS Take by mouth 2 (two) times daily.        . promethazine (PHENERGAN) 25 MG suppository Place 1 suppository (25 mg total) rectally every 6 (six) hours as needed.  12 each  6  . promethazine (PHENERGAN) 25 MG tablet TAKE TWO TABLETS BY MOUTH EVERY 4 HOURS AS NEEDED  360 tablet  3  . rosuvastatin (CRESTOR) 20 MG tablet Take 1 tablet (20 mg total) by mouth 2 (two) times a week.  30 tablet  11  . traMADol (ULTRAM) 50 MG tablet TAKE ONE TABLET BY MOUTH EVERY 4 TO 6 HOURS AS NEEDED FOR PAIN  180 tablet  3  . DISCONTD: promethazine (PHENERGAN) 50 MG tablet Take 1 tablet (50 mg total) by mouth every 4 (four) hours as needed.  30 tablet  6    BP 130/76  Pulse 72  Temp 98.2 F (36.8 C)  Resp 16  Ht 5\' 8"  (1.727 m)  Wt 208 lb (94.348 kg)  BMI 31.63 kg/m2        Objective:   Physical Exam  Constitutional: He appears well-developed and well-nourished.  HENT:  Head: Normocephalic and atraumatic.  Eyes: Conjunctivae normal are normal. Pupils are equal, round, and reactive to light.  Neck: Normal range of motion. Neck supple.  Cardiovascular: Normal rate and regular rhythm.   Pulmonary/Chest: Effort normal and breath sounds normal.  Abdominal: Soft. Bowel sounds are normal.          Assessment & Plan:  Patient has stable blood pressure  The patient's nausea secondary to his chronic labyrinthitis and chronic nausea is stable he asked for generic Phenergan.  The patient's sebaceous cyst will be treated with antibiotic therapy at this point in resolved no surgical intervention necessary the site examined and no recurrence of the cyst can be detected to palpation

## 2012-03-11 NOTE — Patient Instructions (Addendum)
The patient is instructed to continue all medications as prescribed. Schedule followup with check out clerk upon leaving the clinic  

## 2012-03-29 DIAGNOSIS — L570 Actinic keratosis: Secondary | ICD-10-CM | POA: Diagnosis not present

## 2012-05-14 DIAGNOSIS — S335XXA Sprain of ligaments of lumbar spine, initial encounter: Secondary | ICD-10-CM | POA: Diagnosis not present

## 2012-05-14 DIAGNOSIS — S298XXA Other specified injuries of thorax, initial encounter: Secondary | ICD-10-CM | POA: Diagnosis not present

## 2012-05-14 DIAGNOSIS — S0993XA Unspecified injury of face, initial encounter: Secondary | ICD-10-CM | POA: Diagnosis not present

## 2012-05-14 DIAGNOSIS — E7800 Pure hypercholesterolemia, unspecified: Secondary | ICD-10-CM | POA: Diagnosis not present

## 2012-05-14 DIAGNOSIS — S239XXA Sprain of unspecified parts of thorax, initial encounter: Secondary | ICD-10-CM | POA: Diagnosis not present

## 2012-05-14 DIAGNOSIS — Z79899 Other long term (current) drug therapy: Secondary | ICD-10-CM | POA: Diagnosis not present

## 2012-05-14 DIAGNOSIS — G8929 Other chronic pain: Secondary | ICD-10-CM | POA: Diagnosis not present

## 2012-05-14 DIAGNOSIS — Z043 Encounter for examination and observation following other accident: Secondary | ICD-10-CM | POA: Diagnosis not present

## 2012-05-14 DIAGNOSIS — K219 Gastro-esophageal reflux disease without esophagitis: Secondary | ICD-10-CM | POA: Diagnosis not present

## 2012-05-14 DIAGNOSIS — IMO0002 Reserved for concepts with insufficient information to code with codable children: Secondary | ICD-10-CM | POA: Diagnosis not present

## 2012-05-14 DIAGNOSIS — S139XXA Sprain of joints and ligaments of unspecified parts of neck, initial encounter: Secondary | ICD-10-CM | POA: Diagnosis not present

## 2012-05-14 DIAGNOSIS — M47812 Spondylosis without myelopathy or radiculopathy, cervical region: Secondary | ICD-10-CM | POA: Diagnosis not present

## 2012-05-14 DIAGNOSIS — M549 Dorsalgia, unspecified: Secondary | ICD-10-CM | POA: Diagnosis not present

## 2012-05-14 DIAGNOSIS — R937 Abnormal findings on diagnostic imaging of other parts of musculoskeletal system: Secondary | ICD-10-CM | POA: Diagnosis not present

## 2012-06-07 ENCOUNTER — Other Ambulatory Visit: Payer: Self-pay | Admitting: Internal Medicine

## 2012-06-07 ENCOUNTER — Other Ambulatory Visit: Payer: Self-pay | Admitting: Family Medicine

## 2012-06-07 DIAGNOSIS — M5030 Other cervical disc degeneration, unspecified cervical region: Secondary | ICD-10-CM | POA: Diagnosis not present

## 2012-06-07 DIAGNOSIS — M542 Cervicalgia: Secondary | ICD-10-CM | POA: Diagnosis not present

## 2012-06-07 DIAGNOSIS — M5137 Other intervertebral disc degeneration, lumbosacral region: Secondary | ICD-10-CM | POA: Diagnosis not present

## 2012-06-07 DIAGNOSIS — M545 Low back pain, unspecified: Secondary | ICD-10-CM | POA: Diagnosis not present

## 2012-06-15 ENCOUNTER — Ambulatory Visit: Payer: Medicare Other | Admitting: Physical Therapy

## 2012-06-16 ENCOUNTER — Ambulatory Visit: Payer: Medicare Other | Admitting: Internal Medicine

## 2012-07-05 ENCOUNTER — Encounter: Payer: Medicare Other | Admitting: Physical Therapy

## 2012-07-08 ENCOUNTER — Ambulatory Visit: Payer: Medicare Other | Attending: Rehabilitation | Admitting: Physical Therapy

## 2012-07-08 DIAGNOSIS — M542 Cervicalgia: Secondary | ICD-10-CM | POA: Insufficient documentation

## 2012-07-08 DIAGNOSIS — M545 Low back pain, unspecified: Secondary | ICD-10-CM | POA: Insufficient documentation

## 2012-07-08 DIAGNOSIS — IMO0001 Reserved for inherently not codable concepts without codable children: Secondary | ICD-10-CM | POA: Insufficient documentation

## 2012-07-08 DIAGNOSIS — R5381 Other malaise: Secondary | ICD-10-CM | POA: Insufficient documentation

## 2012-07-08 DIAGNOSIS — M546 Pain in thoracic spine: Secondary | ICD-10-CM | POA: Insufficient documentation

## 2012-07-12 ENCOUNTER — Ambulatory Visit: Payer: Medicare Other | Admitting: *Deleted

## 2012-07-12 DIAGNOSIS — R5381 Other malaise: Secondary | ICD-10-CM | POA: Diagnosis not present

## 2012-07-12 DIAGNOSIS — M542 Cervicalgia: Secondary | ICD-10-CM | POA: Diagnosis not present

## 2012-07-12 DIAGNOSIS — M546 Pain in thoracic spine: Secondary | ICD-10-CM | POA: Diagnosis not present

## 2012-07-12 DIAGNOSIS — IMO0001 Reserved for inherently not codable concepts without codable children: Secondary | ICD-10-CM | POA: Diagnosis not present

## 2012-07-12 DIAGNOSIS — M545 Low back pain, unspecified: Secondary | ICD-10-CM | POA: Diagnosis not present

## 2012-07-14 ENCOUNTER — Ambulatory Visit: Payer: Medicare Other | Admitting: Physical Therapy

## 2012-07-14 DIAGNOSIS — M545 Low back pain, unspecified: Secondary | ICD-10-CM | POA: Diagnosis not present

## 2012-07-14 DIAGNOSIS — M546 Pain in thoracic spine: Secondary | ICD-10-CM | POA: Diagnosis not present

## 2012-07-14 DIAGNOSIS — M542 Cervicalgia: Secondary | ICD-10-CM | POA: Diagnosis not present

## 2012-07-14 DIAGNOSIS — IMO0001 Reserved for inherently not codable concepts without codable children: Secondary | ICD-10-CM | POA: Diagnosis not present

## 2012-07-14 DIAGNOSIS — R5381 Other malaise: Secondary | ICD-10-CM | POA: Diagnosis not present

## 2012-07-16 ENCOUNTER — Ambulatory Visit: Payer: Medicare Other | Admitting: Physical Therapy

## 2012-07-16 DIAGNOSIS — R5381 Other malaise: Secondary | ICD-10-CM | POA: Diagnosis not present

## 2012-07-16 DIAGNOSIS — IMO0001 Reserved for inherently not codable concepts without codable children: Secondary | ICD-10-CM | POA: Diagnosis not present

## 2012-07-16 DIAGNOSIS — M545 Low back pain, unspecified: Secondary | ICD-10-CM | POA: Diagnosis not present

## 2012-07-16 DIAGNOSIS — M542 Cervicalgia: Secondary | ICD-10-CM | POA: Diagnosis not present

## 2012-07-16 DIAGNOSIS — M546 Pain in thoracic spine: Secondary | ICD-10-CM | POA: Diagnosis not present

## 2012-07-19 ENCOUNTER — Ambulatory Visit: Payer: Medicare Other | Admitting: Physical Therapy

## 2012-07-19 DIAGNOSIS — M542 Cervicalgia: Secondary | ICD-10-CM | POA: Diagnosis not present

## 2012-07-19 DIAGNOSIS — M545 Low back pain, unspecified: Secondary | ICD-10-CM | POA: Diagnosis not present

## 2012-07-19 DIAGNOSIS — M546 Pain in thoracic spine: Secondary | ICD-10-CM | POA: Diagnosis not present

## 2012-07-19 DIAGNOSIS — IMO0001 Reserved for inherently not codable concepts without codable children: Secondary | ICD-10-CM | POA: Diagnosis not present

## 2012-07-19 DIAGNOSIS — R5381 Other malaise: Secondary | ICD-10-CM | POA: Diagnosis not present

## 2012-07-21 ENCOUNTER — Ambulatory Visit: Payer: Medicare Other | Admitting: Physical Therapy

## 2012-07-21 DIAGNOSIS — M542 Cervicalgia: Secondary | ICD-10-CM | POA: Diagnosis not present

## 2012-07-21 DIAGNOSIS — R5381 Other malaise: Secondary | ICD-10-CM | POA: Diagnosis not present

## 2012-07-21 DIAGNOSIS — M545 Low back pain, unspecified: Secondary | ICD-10-CM | POA: Diagnosis not present

## 2012-07-21 DIAGNOSIS — M546 Pain in thoracic spine: Secondary | ICD-10-CM | POA: Diagnosis not present

## 2012-07-21 DIAGNOSIS — IMO0001 Reserved for inherently not codable concepts without codable children: Secondary | ICD-10-CM | POA: Diagnosis not present

## 2012-07-23 ENCOUNTER — Ambulatory Visit: Payer: Medicare Other | Admitting: Physical Therapy

## 2012-07-23 DIAGNOSIS — M542 Cervicalgia: Secondary | ICD-10-CM | POA: Diagnosis not present

## 2012-07-23 DIAGNOSIS — R5381 Other malaise: Secondary | ICD-10-CM | POA: Diagnosis not present

## 2012-07-23 DIAGNOSIS — IMO0001 Reserved for inherently not codable concepts without codable children: Secondary | ICD-10-CM | POA: Diagnosis not present

## 2012-07-23 DIAGNOSIS — M546 Pain in thoracic spine: Secondary | ICD-10-CM | POA: Diagnosis not present

## 2012-07-23 DIAGNOSIS — M545 Low back pain, unspecified: Secondary | ICD-10-CM | POA: Diagnosis not present

## 2012-07-26 ENCOUNTER — Ambulatory Visit: Payer: Medicare Other | Admitting: *Deleted

## 2012-07-26 DIAGNOSIS — M545 Low back pain, unspecified: Secondary | ICD-10-CM | POA: Diagnosis not present

## 2012-07-26 DIAGNOSIS — M542 Cervicalgia: Secondary | ICD-10-CM | POA: Diagnosis not present

## 2012-07-26 DIAGNOSIS — R5381 Other malaise: Secondary | ICD-10-CM | POA: Diagnosis not present

## 2012-07-26 DIAGNOSIS — IMO0001 Reserved for inherently not codable concepts without codable children: Secondary | ICD-10-CM | POA: Diagnosis not present

## 2012-07-26 DIAGNOSIS — M546 Pain in thoracic spine: Secondary | ICD-10-CM | POA: Diagnosis not present

## 2012-07-28 ENCOUNTER — Encounter: Payer: Medicare Other | Admitting: *Deleted

## 2012-07-30 ENCOUNTER — Ambulatory Visit: Payer: Medicare Other | Admitting: *Deleted

## 2012-07-30 DIAGNOSIS — R5381 Other malaise: Secondary | ICD-10-CM | POA: Diagnosis not present

## 2012-07-30 DIAGNOSIS — M546 Pain in thoracic spine: Secondary | ICD-10-CM | POA: Diagnosis not present

## 2012-07-30 DIAGNOSIS — M542 Cervicalgia: Secondary | ICD-10-CM | POA: Diagnosis not present

## 2012-07-30 DIAGNOSIS — IMO0001 Reserved for inherently not codable concepts without codable children: Secondary | ICD-10-CM | POA: Diagnosis not present

## 2012-07-30 DIAGNOSIS — M545 Low back pain, unspecified: Secondary | ICD-10-CM | POA: Diagnosis not present

## 2012-08-02 ENCOUNTER — Ambulatory Visit: Payer: Medicare Other | Attending: Rehabilitation | Admitting: *Deleted

## 2012-08-02 DIAGNOSIS — M546 Pain in thoracic spine: Secondary | ICD-10-CM | POA: Diagnosis not present

## 2012-08-02 DIAGNOSIS — M545 Low back pain, unspecified: Secondary | ICD-10-CM | POA: Insufficient documentation

## 2012-08-02 DIAGNOSIS — M542 Cervicalgia: Secondary | ICD-10-CM | POA: Insufficient documentation

## 2012-08-02 DIAGNOSIS — IMO0001 Reserved for inherently not codable concepts without codable children: Secondary | ICD-10-CM | POA: Diagnosis not present

## 2012-08-02 DIAGNOSIS — R5381 Other malaise: Secondary | ICD-10-CM | POA: Insufficient documentation

## 2012-08-04 ENCOUNTER — Ambulatory Visit: Payer: Medicare Other | Admitting: *Deleted

## 2012-08-05 DIAGNOSIS — M545 Low back pain, unspecified: Secondary | ICD-10-CM | POA: Diagnosis not present

## 2012-08-05 DIAGNOSIS — M542 Cervicalgia: Secondary | ICD-10-CM | POA: Diagnosis not present

## 2012-08-05 DIAGNOSIS — IMO0002 Reserved for concepts with insufficient information to code with codable children: Secondary | ICD-10-CM | POA: Diagnosis not present

## 2012-08-06 ENCOUNTER — Ambulatory Visit: Payer: Medicare Other | Admitting: *Deleted

## 2012-08-06 DIAGNOSIS — Z79899 Other long term (current) drug therapy: Secondary | ICD-10-CM | POA: Diagnosis not present

## 2012-08-06 DIAGNOSIS — M542 Cervicalgia: Secondary | ICD-10-CM | POA: Diagnosis not present

## 2012-08-09 ENCOUNTER — Ambulatory Visit: Payer: Medicare Other

## 2012-08-11 ENCOUNTER — Encounter: Payer: Medicare Other | Admitting: *Deleted

## 2012-08-13 ENCOUNTER — Encounter: Payer: Medicare Other | Admitting: *Deleted

## 2012-08-16 ENCOUNTER — Ambulatory Visit: Payer: Medicare Other | Admitting: Physical Therapy

## 2012-08-19 ENCOUNTER — Ambulatory Visit: Payer: Medicare Other | Admitting: Physical Therapy

## 2012-08-23 ENCOUNTER — Ambulatory Visit: Payer: Medicare Other | Admitting: *Deleted

## 2012-08-24 ENCOUNTER — Ambulatory Visit (INDEPENDENT_AMBULATORY_CARE_PROVIDER_SITE_OTHER): Payer: Medicare Other | Admitting: Internal Medicine

## 2012-08-24 ENCOUNTER — Encounter: Payer: Self-pay | Admitting: Internal Medicine

## 2012-08-24 ENCOUNTER — Ambulatory Visit: Payer: Medicare Other | Admitting: Internal Medicine

## 2012-08-24 VITALS — BP 140/88 | HR 84 | Temp 98.3°F | Resp 16 | Ht 68.0 in | Wt 216.0 lb

## 2012-08-24 DIAGNOSIS — I10 Essential (primary) hypertension: Secondary | ICD-10-CM | POA: Diagnosis not present

## 2012-08-24 DIAGNOSIS — J329 Chronic sinusitis, unspecified: Secondary | ICD-10-CM

## 2012-08-24 DIAGNOSIS — K219 Gastro-esophageal reflux disease without esophagitis: Secondary | ICD-10-CM

## 2012-08-24 DIAGNOSIS — E785 Hyperlipidemia, unspecified: Secondary | ICD-10-CM

## 2012-08-24 MED ORDER — SULFAMETHOXAZOLE-TRIMETHOPRIM 800-160 MG PO TABS: 1.0000 | ORAL_TABLET | Freq: Two times a day (BID) | ORAL | Status: DC

## 2012-08-24 NOTE — Patient Instructions (Signed)
The patient is instructed to continue all medications as prescribed. Schedule followup with check out clerk upon leaving the clinic  

## 2012-08-24 NOTE — Progress Notes (Signed)
  Subjective:    Patient ID: [name], male    DOB: [birth year], 67 y.o.   MRN: [id number]  HPI Chronic sinus symptoms Blood discharge and head aches Increased urination and dysuria HTN recheck stable  Review of Systems  Constitutional: Negative for fever and fatigue.  HENT: Negative for hearing loss, congestion, neck pain and postnasal drip.   Eyes: Negative for discharge, redness and visual disturbance.  Respiratory: Negative for cough, shortness of breath and wheezing.   Cardiovascular: Negative for leg swelling.  Gastrointestinal: Negative for abdominal pain, constipation and abdominal distention.  Genitourinary: Positive for dysuria, urgency and enuresis. Negative for frequency.  Musculoskeletal: Negative for joint swelling and arthralgias.  Skin: Negative for color change and rash.  Neurological: Negative for weakness and light-headedness.  Hematological: Negative for adenopathy.  Psychiatric/Behavioral: Negative for behavioral problems.       Objective:   Physical Exam        Assessment & Plan:

## 2012-08-26 ENCOUNTER — Ambulatory Visit: Payer: Medicare Other | Admitting: Physical Therapy

## 2012-08-27 ENCOUNTER — Ambulatory Visit: Payer: Medicare Other | Admitting: Internal Medicine

## 2012-08-27 NOTE — Progress Notes (Signed)
  Subjective:    Patient ID: [name], male    DOB: 1946-06-11, 67 y.o.   MRN: [id number]  HPI  The not was somehow transfered to a hospital encounter The completed note in a hospital encounter note of the same date that was signed on the same day   Review of Systems     Objective:   Physical Exam        Assessment & Plan:

## 2012-08-30 ENCOUNTER — Encounter: Payer: Medicare Other | Admitting: Physical Therapy

## 2012-08-30 NOTE — Progress Notes (Signed)
  Subjective:    Patient ID: [name], male    DOB: 14-Jul-1945, 67 y.o.   MRN: [id number]  HPI    Review of Systems  HENT: Positive for nosebleeds, congestion, rhinorrhea and postnasal drip.   Gastrointestinal: Positive for abdominal pain.  Neurological: Positive for dizziness and headaches.       Objective:   Physical Exam  Nursing note and vitals reviewed. Constitutional: He appears well-developed and well-nourished.  HENT:  Head: Normocephalic and atraumatic.  Swollen turbinated  Eyes: Conjunctivae are normal. Pupils are equal, round, and reactive to light.  Neck: Normal range of motion. Neck supple.  Cardiovascular: Normal rate and regular rhythm.   Pulmonary/Chest: Effort normal and breath sounds normal.  Abdominal: Soft. Bowel sounds are normal. He exhibits distension. There is tenderness.          Assessment & Plan:  Acute on chronic sinusitis levofloxin 500 x 10 days Discussed sinusitis

## 2012-09-01 ENCOUNTER — Ambulatory Visit: Payer: Medicare Other | Attending: Rehabilitation | Admitting: Physical Therapy

## 2012-09-01 DIAGNOSIS — M542 Cervicalgia: Secondary | ICD-10-CM | POA: Diagnosis not present

## 2012-09-01 DIAGNOSIS — M545 Low back pain, unspecified: Secondary | ICD-10-CM | POA: Insufficient documentation

## 2012-09-01 DIAGNOSIS — IMO0001 Reserved for inherently not codable concepts without codable children: Secondary | ICD-10-CM | POA: Diagnosis not present

## 2012-09-01 DIAGNOSIS — R5381 Other malaise: Secondary | ICD-10-CM | POA: Insufficient documentation

## 2012-09-01 DIAGNOSIS — M546 Pain in thoracic spine: Secondary | ICD-10-CM | POA: Insufficient documentation

## 2012-09-02 ENCOUNTER — Ambulatory Visit: Payer: Medicare Other | Admitting: Physical Therapy

## 2012-09-02 DIAGNOSIS — M545 Low back pain, unspecified: Secondary | ICD-10-CM | POA: Diagnosis not present

## 2012-09-02 DIAGNOSIS — M542 Cervicalgia: Secondary | ICD-10-CM | POA: Diagnosis not present

## 2012-09-02 DIAGNOSIS — IMO0002 Reserved for concepts with insufficient information to code with codable children: Secondary | ICD-10-CM | POA: Diagnosis not present

## 2012-09-03 ENCOUNTER — Other Ambulatory Visit: Payer: Self-pay | Admitting: Internal Medicine

## 2012-09-06 ENCOUNTER — Ambulatory Visit: Payer: Medicare Other | Admitting: Physical Therapy

## 2012-09-07 ENCOUNTER — Other Ambulatory Visit: Payer: Self-pay | Admitting: Rehabilitation

## 2012-09-07 DIAGNOSIS — M545 Low back pain, unspecified: Secondary | ICD-10-CM

## 2012-09-08 ENCOUNTER — Ambulatory Visit: Payer: Medicare Other | Admitting: Physical Therapy

## 2012-09-13 ENCOUNTER — Ambulatory Visit
Admission: RE | Admit: 2012-09-13 | Discharge: 2012-09-13 | Disposition: A | Discharge status: Home / Self Care | Payer: Medicare Other | Source: Ambulatory Visit | Attending: Rehabilitation | Admitting: Rehabilitation

## 2012-09-13 ENCOUNTER — Ambulatory Visit: Payer: Medicare Other | Admitting: *Deleted

## 2012-09-13 DIAGNOSIS — M545 Low back pain, unspecified: Secondary | ICD-10-CM

## 2012-09-13 DIAGNOSIS — M5137 Other intervertebral disc degeneration, lumbosacral region: Secondary | ICD-10-CM | POA: Diagnosis not present

## 2012-09-15 ENCOUNTER — Ambulatory Visit: Payer: Medicare Other | Admitting: *Deleted

## 2012-09-20 ENCOUNTER — Ambulatory Visit: Payer: Medicare Other | Admitting: *Deleted

## 2012-09-22 ENCOUNTER — Ambulatory Visit: Payer: Medicare Other | Admitting: *Deleted

## 2012-09-27 ENCOUNTER — Ambulatory Visit: Payer: Medicare Other | Admitting: Physical Therapy

## 2012-09-29 ENCOUNTER — Ambulatory Visit: Payer: Medicare Other | Attending: Rehabilitation | Admitting: Physical Therapy

## 2012-09-29 DIAGNOSIS — M542 Cervicalgia: Secondary | ICD-10-CM | POA: Insufficient documentation

## 2012-09-29 DIAGNOSIS — IMO0001 Reserved for inherently not codable concepts without codable children: Secondary | ICD-10-CM | POA: Diagnosis not present

## 2012-09-29 DIAGNOSIS — M546 Pain in thoracic spine: Secondary | ICD-10-CM | POA: Insufficient documentation

## 2012-09-29 DIAGNOSIS — M545 Low back pain, unspecified: Secondary | ICD-10-CM | POA: Insufficient documentation

## 2012-09-29 DIAGNOSIS — R5381 Other malaise: Secondary | ICD-10-CM | POA: Diagnosis not present

## 2012-10-04 DIAGNOSIS — M545 Low back pain, unspecified: Secondary | ICD-10-CM | POA: Diagnosis not present

## 2012-10-04 DIAGNOSIS — IMO0002 Reserved for concepts with insufficient information to code with codable children: Secondary | ICD-10-CM | POA: Diagnosis not present

## 2012-10-04 DIAGNOSIS — M542 Cervicalgia: Secondary | ICD-10-CM | POA: Diagnosis not present

## 2012-10-19 DIAGNOSIS — IMO0002 Reserved for concepts with insufficient information to code with codable children: Secondary | ICD-10-CM | POA: Diagnosis not present

## 2012-10-19 DIAGNOSIS — M545 Low back pain, unspecified: Secondary | ICD-10-CM | POA: Diagnosis not present

## 2012-10-19 DIAGNOSIS — M542 Cervicalgia: Secondary | ICD-10-CM | POA: Diagnosis not present

## 2012-11-09 DIAGNOSIS — IMO0002 Reserved for concepts with insufficient information to code with codable children: Secondary | ICD-10-CM | POA: Diagnosis not present

## 2012-11-09 DIAGNOSIS — M5030 Other cervical disc degeneration, unspecified cervical region: Secondary | ICD-10-CM | POA: Diagnosis not present

## 2012-11-09 DIAGNOSIS — M545 Low back pain, unspecified: Secondary | ICD-10-CM | POA: Diagnosis not present

## 2012-12-01 ENCOUNTER — Other Ambulatory Visit: Payer: Self-pay | Admitting: *Deleted

## 2012-12-01 ENCOUNTER — Telehealth: Payer: Self-pay | Admitting: *Deleted

## 2012-12-01 MED ORDER — CEPHALEXIN 500 MG PO CAPS: 500.0000 mg | ORAL_CAPSULE | Freq: Four times a day (QID) | ORAL | Status: DC

## 2012-12-01 NOTE — Telephone Encounter (Signed)
Pt called and states that old sebaceous cyst is back on back with redness,pain,swelling and ready for i&d.  Took antibiotic that dr Lovell Sheehan gave him for a week,but no better.  Per dr Lovell Sheehan- to surgeon tomorrow at 3pm and start keflex 500 qid for 7 days- appointment is for tomorrow with dr gerkin and antibiotic called in -pt is aware

## 2012-12-02 ENCOUNTER — Encounter (INDEPENDENT_AMBULATORY_CARE_PROVIDER_SITE_OTHER): Payer: Self-pay | Admitting: Surgery

## 2012-12-02 ENCOUNTER — Ambulatory Visit (INDEPENDENT_AMBULATORY_CARE_PROVIDER_SITE_OTHER): Payer: Medicare Other | Admitting: Surgery

## 2012-12-02 VITALS — BP 126/76 | HR 74 | Temp 99.0°F | Resp 16 | Ht 68.0 in | Wt 212.0 lb

## 2012-12-02 DIAGNOSIS — L02219 Cutaneous abscess of trunk, unspecified: Secondary | ICD-10-CM | POA: Diagnosis not present

## 2012-12-02 DIAGNOSIS — L02212 Cutaneous abscess of back [any part, except buttock]: Secondary | ICD-10-CM

## 2012-12-02 NOTE — Patient Instructions (Signed)
Change gauze dressing as needed.  Remove packing in 48-72 hours.  Shower daily and cover with dry gauze until no further drainage.  Complete course of oral antibiotics as prescribed.  Velora Heckler, MD, [facility] Office: [phone]

## 2012-12-02 NOTE — Progress Notes (Signed)
General Surgery [facility]  Chief Complaint  Patient presents with  . New Evaluation    eval infected cyst on back - referral from Dr. Darryll Capers    HISTORY: Patient is a 67 year old male referred by his primary care physician with a third time recurrence of inflammation and abscess on the back likely related to a sebaceous cyst. Patient has been treated on 2 prior occasions successfully with oral antibiotics. He is now seen recurrence. He was started on oral antibiotics yesterday. He is referred to surgery for incision and drainage and possibly definitive excision.  Past Medical History  Diagnosis Date  . HYPERLIPIDEMIA 12/24/2006  . HYPERTENSION 12/24/2006  . ALLERGIC RHINITIS CAUSE UNSPECIFIED 05/27/2010  . GERD 12/24/2006  . DIVERTICULOSIS, COLON 03/09/2007  . Other constipation 11/06/2009  . PROSTATITIS, RECURRENT 12/30/2007  . OSTEOARTHRITIS 12/24/2006  . SHOULDER PAIN, BILATERAL 07/16/2007  . Cervicalgia 05/27/2010  . BACK PAIN, UPPER 02/21/2010  . DIZZINESS, CHRONIC 03/09/2007  . INSOMNIA WITH SLEEP APNEA UNSPECIFIED 05/27/2010  . HEADACHE, SINUS 04/06/2009  . CHEST PAIN UNSPECIFIED 04/25/2009  . Open wound of wrist, complicated 10/13/2007  . ADVEF, DRUG/MEDICINAL/BIOLOGICAL SUBST NOS 03/09/2007  . TRANSIENT ISCHEMIC ATTACKS, HX OF 04/06/2009     Current Outpatient Prescriptions  Medication Sig Dispense Refill  . amitriptyline (ELAVIL) 25 MG tablet TAKE FIVE TABLETS BY MOUTH AT BEDTIME  450 tablet  2  . BENICAR HCT 20-12.5 MG per tablet TAKE ONE TABLET BY MOUTH EVERY DAY  30 tablet  0  . cephALEXin (KEFLEX) 500 MG capsule Take 1 capsule (500 mg total) by mouth 4 (four) times daily.  28 capsule  0  . cyclobenzaprine (FLEXERIL) 5 MG tablet Take 5 mg by mouth 3 (three) times daily as needed for muscle spasms.      Marland Kitchen docusate sodium (COLACE) 100 MG capsule Take 100 mg by mouth nightly.        . meclizine (ANTIVERT) 12.5 MG tablet TAKE TWO TABLETS BY MOUTH EVERY 6  HOURS AS NEEDED  180 tablet  2  . omeprazole (PRILOSEC) 40 MG capsule TAKE ONE CAPSULE BY MOUTH EVERY DAY  90 capsule  2  . Probiotic Product (PHILLIPS COLON HEALTH) CAPS Take by mouth 2 (two) times daily.        . promethazine (PHENERGAN) 25 MG suppository Place 1 suppository (25 mg total) rectally every 6 (six) hours as needed.  12 each  6  . valACYclovir (VALTREX) 500 MG tablet Take 1 tablet (500 mg total) by mouth 2 (two) times daily.  30 tablet  3  . HYDROcodone-acetaminophen (NORCO/VICODIN) 5-325 MG per tablet       . Lactobacillus (ACIDOPHILUS EXTRA STRENGTH) CAPS Take by mouth daily.        Marland Kitchen loratadine (CLARITIN) 10 MG tablet Take 10 mg by mouth daily.        . meloxicam (MOBIC) 15 MG tablet Take 1 tablet (15 mg total) by mouth daily.  90 tablet  3  . rosuvastatin (CRESTOR) 20 MG tablet Take 1 tablet (20 mg total) by mouth 2 (two) times a week.  30 tablet  11  . traMADol (ULTRAM) 50 MG tablet TAKE ONE TABLET BY MOUTH EVERY 4 TO 6 HOURS AS NEEDED FOR PAIN  180 tablet  3   No current facility-administered medications for this visit.     Allergies  Allergen Reactions  . Codeine     REACTION: Nausea     Family History  Problem Relation Age of  Onset  . Achalasia Father   . Depression Father   . Heart disease Brother 9  . Kidney failure Mother   . Diabetes Sister      History   Social History  . Marital Status: Married    Spouse Name: N/A    Number of Children: N/A  . Years of Education: N/A   Occupational History  . Retired     Disabled   Social History Main Topics  . Smoking status: Former Smoker    Quit date: 08/15/1975  . Smokeless tobacco: None     Comment: quit 35 years ago  . Alcohol Use: No  . Drug Use: No  . Sexually Active: Yes   Other Topics Concern  . None   Social History Narrative  . None     REVIEW OF SYSTEMS - PERTINENT POSITIVES ONLY: No drainage. 2 previous abscesses at this site treated with antibiotics.  EXAM: Filed Vitals:    12/02/12 1515  BP: 126/76  Pulse: 74  Temp: 99 F (37.2 C)  Resp: 16    HEENT: normocephalic; pupils equal and reactive; sclerae clear; dentition good; mucous membranes moist NECK:  symmetric on extension; no palpable anterior or posterior cervical lymphadenopathy; no supraclavicular masses; no tenderness BACK:  In the left mid back is a large 6 cm violaceous fluctuant area which is moderately tender to palpation consistent with abscess and overlying cellulitis EXT:  non-tender without edema; no deformity NEURO: no gross focal deficits; no sign of tremor   LABORATORY RESULTS: See [facility] (CHL-Epic) for most recent results   RADIOLOGY RESULTS: See [facility] (CHL-Epic) for most recent results  PROCEDURE: Under aseptic conditions using local anesthetic, a 1.5 cm incision is made in the roof of the abscess cavity. Thick brown purulent fluid is evacuated. Wound is packed with quarter inch iodoform gauze packing and a dry gauze dressing is placed   IMPRESSION: Abscess and cellulitis arising from sebaceous cyst, left mid back  PLAN: Usual instructions for wound care are given. Patient will remove the packing in 48 hours. He will dress the wound until there is no further drainage. He will complete his course of oral antibiotics as prescribed by his primary care physician.  Patient will return in 3-4 weeks. At that point the inflammation should have resolved completely and we will plan for definitive excision as an outpatient surgical procedure.  Velora Heckler, MD, FACS General & Endocrine Surgery [facility]   Visit Diagnoses: 1. Abscess of back     Primary Care Physician: Carrie Mew, MD

## 2012-12-06 ENCOUNTER — Other Ambulatory Visit: Payer: Self-pay | Admitting: Dermatology

## 2012-12-06 DIAGNOSIS — L570 Actinic keratosis: Secondary | ICD-10-CM | POA: Diagnosis not present

## 2012-12-06 DIAGNOSIS — D485 Neoplasm of uncertain behavior of skin: Secondary | ICD-10-CM | POA: Diagnosis not present

## 2012-12-06 DIAGNOSIS — L259 Unspecified contact dermatitis, unspecified cause: Secondary | ICD-10-CM | POA: Diagnosis not present

## 2012-12-09 DIAGNOSIS — IMO0002 Reserved for concepts with insufficient information to code with codable children: Secondary | ICD-10-CM | POA: Diagnosis not present

## 2012-12-09 DIAGNOSIS — M542 Cervicalgia: Secondary | ICD-10-CM | POA: Diagnosis not present

## 2012-12-09 DIAGNOSIS — M545 Low back pain, unspecified: Secondary | ICD-10-CM | POA: Diagnosis not present

## 2012-12-22 ENCOUNTER — Encounter: Payer: Self-pay | Admitting: Internal Medicine

## 2012-12-22 ENCOUNTER — Ambulatory Visit (INDEPENDENT_AMBULATORY_CARE_PROVIDER_SITE_OTHER): Payer: Medicare Other | Admitting: Internal Medicine

## 2012-12-22 VITALS — BP 130/74 | HR 72 | Temp 98.6°F | Resp 16 | Ht 68.0 in | Wt 210.0 lb

## 2012-12-22 DIAGNOSIS — I10 Essential (primary) hypertension: Secondary | ICD-10-CM

## 2012-12-22 DIAGNOSIS — E785 Hyperlipidemia, unspecified: Secondary | ICD-10-CM | POA: Diagnosis not present

## 2012-12-22 DIAGNOSIS — T887XXA Unspecified adverse effect of drug or medicament, initial encounter: Secondary | ICD-10-CM

## 2012-12-22 DIAGNOSIS — N138 Other obstructive and reflux uropathy: Secondary | ICD-10-CM

## 2012-12-22 DIAGNOSIS — N401 Enlarged prostate with lower urinary tract symptoms: Secondary | ICD-10-CM | POA: Diagnosis not present

## 2012-12-22 LAB — CBC WITH DIFFERENTIAL/PLATELET
Basophils Absolute: 0 10*3/uL (ref 0.0–0.1)
Eosinophils Absolute: 0.1 10*3/uL (ref 0.0–0.7)
HCT: 46.6 % (ref 39.0–52.0)
Hemoglobin: 15.4 g/dL (ref 13.0–17.0)
Lymphs Abs: 3.1 10*3/uL (ref 0.7–4.0)
MCHC: 33.1 g/dL (ref 30.0–36.0)
MCV: 95.9 fl (ref 78.0–100.0)
Monocytes Absolute: 0.9 10*3/uL (ref 0.1–1.0)
Neutro Abs: 8.5 10*3/uL — ABNORMAL HIGH (ref 1.4–7.7)
RDW: 13.4 % (ref 11.5–14.6)

## 2012-12-22 LAB — BASIC METABOLIC PANEL
Calcium: 10.4 mg/dL (ref 8.4–10.5)
GFR: 64.42 mL/min (ref >60.00)
Sodium: 135 mEq/L (ref 135–145)

## 2012-12-22 LAB — POCT URINALYSIS DIPSTICK
Bilirubin, UA: NEGATIVE
Blood, UA: NEGATIVE
Protein, UA: NEGATIVE
pH, UA: 7

## 2012-12-22 LAB — LIPID PANEL
Cholesterol: 253 mg/dL — ABNORMAL HIGH (ref 0–200)
HDL: 43.3 mg/dL (ref >39.00)
Triglycerides: 191 mg/dL — ABNORMAL HIGH (ref 0.0–149.0)

## 2012-12-22 LAB — HEPATIC FUNCTION PANEL
ALT: 23 U/L (ref 0–53)
AST: 22 U/L (ref 0–37)
Albumin: 4.4 g/dL (ref 3.5–5.2)

## 2012-12-22 MED ORDER — FINASTERIDE 5 MG PO TABS: 5.0000 mg | ORAL_TABLET | Freq: Every day | ORAL | Status: DC

## 2012-12-22 NOTE — Patient Instructions (Signed)
The patient is instructed to continue all medications as prescribed. Schedule followup with check out clerk upon leaving the clinic  
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
## 2012-12-28 ENCOUNTER — Other Ambulatory Visit: Payer: Self-pay | Admitting: Internal Medicine

## 2012-12-29 ENCOUNTER — Ambulatory Visit (INDEPENDENT_AMBULATORY_CARE_PROVIDER_SITE_OTHER): Payer: Medicare Other | Admitting: Surgery

## 2012-12-29 ENCOUNTER — Encounter (INDEPENDENT_AMBULATORY_CARE_PROVIDER_SITE_OTHER): Payer: Self-pay | Admitting: Surgery

## 2012-12-29 VITALS — BP 118/78 | HR 76 | Resp 14 | Ht 68.0 in | Wt 213.0 lb

## 2012-12-29 DIAGNOSIS — L02212 Cutaneous abscess of back [any part, except buttock]: Secondary | ICD-10-CM

## 2012-12-29 DIAGNOSIS — L720 Epidermal cyst: Secondary | ICD-10-CM

## 2012-12-29 DIAGNOSIS — L02219 Cutaneous abscess of trunk, unspecified: Secondary | ICD-10-CM

## 2012-12-29 DIAGNOSIS — L723 Sebaceous cyst: Secondary | ICD-10-CM

## 2012-12-29 NOTE — Progress Notes (Signed)
General Surgery - [facility]  Visit Diagnoses: 1. Abscess of back   2. Epidermal cyst     HISTORY: Patient is a 66-year-old male who returns for evaluation of a cyst on the lower back which has repeatedly abscessed. He underwent incision and drainage one month ago. It has now resolved nicely. He is interested in definitive excision to prevent recurrence. He also has a second smaller epidermal cyst in the upper back near the midline which he desires to be excised concurrently.  EXAM: Surgical wound in the lower back has healed completely. There is no fluctuance. There is no residual infection. In the upper back is approximately a 1 cm epidermal inclusion cyst with a prominent sinus tract. No sign of acute infection.  IMPRESSION: History of abscess, epidermal inclusion cysts of the back  PLAN: Patient would like to undergo definitive excision of both lesions. This can be done as an outpatient procedure. We will make arrangements for surgery at a time convenient for him in the future. We have discussed the size and location of the surgical wounds. We discussed the possibility of postoperative infection. He understands and wishes to proceed.  The risks and benefits of the procedure have been discussed at length with the patient.  The patient understands the proposed procedure, potential alternative treatments, and the course of recovery to be expected.  All of the patient's questions have been answered at this time.  The patient wishes to proceed with surgery.  Adysen Raphael M. Jala Dundon, MD, FACS General & Endocrine Surgery [facility]   

## 2012-12-29 NOTE — Patient Instructions (Signed)

## 2013-01-11 ENCOUNTER — Encounter (HOSPITAL_BASED_OUTPATIENT_CLINIC_OR_DEPARTMENT_OTHER): Payer: Self-pay | Admitting: *Deleted

## 2013-01-11 NOTE — Progress Notes (Signed)
To come in for ekg-dr jenkins did labs 12/22/12

## 2013-01-12 ENCOUNTER — Encounter (HOSPITAL_BASED_OUTPATIENT_CLINIC_OR_DEPARTMENT_OTHER)
Admission: RE | Admit: 2013-01-12 | Discharge: 2013-01-12 | Disposition: A | Discharge status: Home / Self Care | Payer: Medicare Other | Source: Ambulatory Visit | Attending: Surgery | Admitting: Surgery

## 2013-01-12 DIAGNOSIS — IMO0001 Reserved for inherently not codable concepts without codable children: Secondary | ICD-10-CM | POA: Diagnosis not present

## 2013-01-12 DIAGNOSIS — I10 Essential (primary) hypertension: Secondary | ICD-10-CM | POA: Diagnosis not present

## 2013-01-12 DIAGNOSIS — Z79899 Other long term (current) drug therapy: Secondary | ICD-10-CM | POA: Diagnosis not present

## 2013-01-12 DIAGNOSIS — L723 Sebaceous cyst: Secondary | ICD-10-CM | POA: Diagnosis not present

## 2013-01-12 DIAGNOSIS — K219 Gastro-esophageal reflux disease without esophagitis: Secondary | ICD-10-CM | POA: Diagnosis not present

## 2013-01-18 ENCOUNTER — Encounter (HOSPITAL_BASED_OUTPATIENT_CLINIC_OR_DEPARTMENT_OTHER): Payer: Self-pay | Admitting: Anesthesiology

## 2013-01-18 ENCOUNTER — Ambulatory Visit (HOSPITAL_BASED_OUTPATIENT_CLINIC_OR_DEPARTMENT_OTHER)
Admission: RE | Admit: 2013-01-18 | Discharge: 2013-01-18 | Disposition: A | Discharge status: Home / Self Care | Payer: Medicare Other | Source: Ambulatory Visit | Attending: Surgery | Admitting: Surgery

## 2013-01-18 ENCOUNTER — Ambulatory Visit (HOSPITAL_BASED_OUTPATIENT_CLINIC_OR_DEPARTMENT_OTHER): Payer: Medicare Other | Admitting: Anesthesiology

## 2013-01-18 ENCOUNTER — Encounter (HOSPITAL_BASED_OUTPATIENT_CLINIC_OR_DEPARTMENT_OTHER)
Admission: RE | Disposition: A | Discharge status: Home / Self Care | Payer: Self-pay | Source: Ambulatory Visit | Attending: Surgery

## 2013-01-18 ENCOUNTER — Encounter (HOSPITAL_BASED_OUTPATIENT_CLINIC_OR_DEPARTMENT_OTHER): Payer: Self-pay | Admitting: *Deleted

## 2013-01-18 DIAGNOSIS — L720 Epidermal cyst: Secondary | ICD-10-CM | POA: Diagnosis present

## 2013-01-18 DIAGNOSIS — I10 Essential (primary) hypertension: Secondary | ICD-10-CM | POA: Diagnosis not present

## 2013-01-18 DIAGNOSIS — L723 Sebaceous cyst: Secondary | ICD-10-CM | POA: Insufficient documentation

## 2013-01-18 DIAGNOSIS — K219 Gastro-esophageal reflux disease without esophagitis: Secondary | ICD-10-CM | POA: Insufficient documentation

## 2013-01-18 DIAGNOSIS — L02212 Cutaneous abscess of back [any part, except buttock]: Secondary | ICD-10-CM | POA: Diagnosis present

## 2013-01-18 DIAGNOSIS — Z79899 Other long term (current) drug therapy: Secondary | ICD-10-CM | POA: Insufficient documentation

## 2013-01-18 HISTORY — PX: EAR CYST EXCISION: SHX22

## 2013-01-18 HISTORY — DX: Other specified postprocedural states: Z98.890

## 2013-01-18 HISTORY — DX: Other specified postprocedural states: R11.2

## 2013-01-18 SURGERY — CYST REMOVAL
Anesthesia: Monitor Anesthesia Care | Site: Back | Laterality: Bilateral | Wound class: Clean

## 2013-01-18 MED ORDER — BUPIVACAINE-EPINEPHRINE 0.5% -1:200000 IJ SOLN
INTRAMUSCULAR | Status: DC | PRN
  Administered 2013-01-18: 15 mL

## 2013-01-18 MED ORDER — MIDAZOLAM HCL 5 MG/5ML IJ SOLN
INTRAMUSCULAR | Status: DC | PRN
  Administered 2013-01-18: 2 mg via INTRAVENOUS

## 2013-01-18 MED ORDER — LACTATED RINGERS IV SOLN
INTRAVENOUS | Status: DC
  Administered 2013-01-18: 12:00:00 via INTRAVENOUS

## 2013-01-18 MED ORDER — SCOPOLAMINE 1 MG/3DAYS TD PT72
1.0000 | MEDICATED_PATCH | Freq: Once | TRANSDERMAL | Status: DC
  Administered 2013-01-18: 1.5 mg via TRANSDERMAL

## 2013-01-18 MED ORDER — ONDANSETRON HCL 4 MG/2ML IJ SOLN
INTRAMUSCULAR | Status: DC | PRN
  Administered 2013-01-18: 4 mg via INTRAVENOUS

## 2013-01-18 MED ORDER — FENTANYL CITRATE 0.05 MG/ML IJ SOLN
INTRAMUSCULAR | Status: DC | PRN
  Administered 2013-01-18: 100 ug via INTRAVENOUS

## 2013-01-18 MED ORDER — HYDROCODONE-ACETAMINOPHEN 5-325 MG PO TABS: 1.0000 | ORAL_TABLET | ORAL | Status: DC | PRN

## 2013-01-18 MED ORDER — LIDOCAINE HCL (CARDIAC) 20 MG/ML IV SOLN
INTRAVENOUS | Status: DC | PRN
  Administered 2013-01-18: 50 mg via INTRAVENOUS

## 2013-01-18 MED ORDER — PROPOFOL 10 MG/ML IV BOLUS
INTRAVENOUS | Status: DC | PRN
  Administered 2013-01-18 (×5): 20 mg via INTRAVENOUS

## 2013-01-18 MED ORDER — CEFAZOLIN SODIUM-DEXTROSE 2-3 GM-% IV SOLR
2.0000 g | INTRAVENOUS | Status: AC
  Administered 2013-01-18: 2 g via INTRAVENOUS

## 2013-01-18 SURGICAL SUPPLY — 36 items
BANDAGE GAUZE ELAST BULKY 4 IN (GAUZE/BANDAGES/DRESSINGS) IMPLANT
BENZOIN TINCTURE PRP APPL 2/3 (GAUZE/BANDAGES/DRESSINGS) ×2 IMPLANT
BLADE SURG 15 STRL LF DISP TIS (BLADE) ×1 IMPLANT
BLADE SURG 15 STRL SS (BLADE) ×1
BNDG COHESIVE 4X5 TAN STRL (GAUZE/BANDAGES/DRESSINGS) IMPLANT
CHLORAPREP W/TINT 26ML (MISCELLANEOUS) ×2 IMPLANT
CLEANER CAUTERY TIP 5X5 PAD (MISCELLANEOUS) IMPLANT
CLOTH BEACON ORANGE TIMEOUT ST (SAFETY) ×2 IMPLANT
COVER MAYO STAND STRL (DRAPES) ×2 IMPLANT
COVER TABLE BACK 60X90 (DRAPES) ×2 IMPLANT
DECANTER SPIKE VIAL GLASS SM (MISCELLANEOUS) IMPLANT
DRAPE EXTREMITY T 121X128X90 (DRAPE) IMPLANT
DRAPE PED LAPAROTOMY (DRAPES) ×2 IMPLANT
DRAPE U-SHAPE 76X120 STRL (DRAPES) IMPLANT
DRAPE UTILITY XL STRL (DRAPES) ×2 IMPLANT
DRSG TEGADERM 4X4.75 (GAUZE/BANDAGES/DRESSINGS) IMPLANT
ELECT REM PT RETURN 9FT ADLT (ELECTROSURGICAL) ×2
ELECTRODE REM PT RTRN 9FT ADLT (ELECTROSURGICAL) ×1 IMPLANT
GAUZE SPONGE 4X4 12PLY STRL LF (GAUZE/BANDAGES/DRESSINGS) ×2 IMPLANT
GLOVE BIO SURGEON STRL SZ 6.5 (GLOVE) ×2 IMPLANT
GLOVE SURG ORTHO 8.0 STRL STRW (GLOVE) ×2 IMPLANT
GOWN PREVENTION PLUS XLARGE (GOWN DISPOSABLE) IMPLANT
GOWN PREVENTION PLUS XXLARGE (GOWN DISPOSABLE) ×4 IMPLANT
NEEDLE HYPO 25X1 1.5 SAFETY (NEEDLE) ×2 IMPLANT
PACK BASIN DAY SURGERY FS (CUSTOM PROCEDURE TRAY) ×2 IMPLANT
PAD CLEANER CAUTERY TIP 5X5 (MISCELLANEOUS)
PENCIL BUTTON HOLSTER BLD 10FT (ELECTRODE) ×2 IMPLANT
SHEET MEDIUM DRAPE 40X70 STRL (DRAPES) IMPLANT
STOCKINETTE 4X48 STRL (DRAPES) IMPLANT
STRIP CLOSURE SKIN 1/2X4 (GAUZE/BANDAGES/DRESSINGS) ×2 IMPLANT
SUT ETHILON 3 0 PS 1 (SUTURE) IMPLANT
SUT VICRYL 3-0 CR8 SH (SUTURE) ×2 IMPLANT
SUT VICRYL 4-0 PS2 18IN ABS (SUTURE) IMPLANT
SYR CONTROL 10ML LL (SYRINGE) ×2 IMPLANT
TOWEL OR 17X24 6PK STRL BLUE (TOWEL DISPOSABLE) ×4 IMPLANT
TOWEL OR NON WOVEN STRL DISP B (DISPOSABLE) ×2 IMPLANT

## 2013-01-18 NOTE — Interval H&P Note (Signed)
History and Physical Interval Note:  01/18/2013 11:35 AM  [name]  has presented today for surgery, with the diagnosis of epidermal inclusion cysts with hx of abscess.     The various methods of treatment have been discussed with the patient and family. After consideration of risks, benefits and other options for treatment, the patient has consented to    Procedure(s): EXCISION CYSTS FROM BACK (TWO) (Bilateral) as a surgical intervention .    The patient's history has been reviewed, patient examined, no change in status, stable for surgery.  I have reviewed the patient's chart and labs.  Questions were answered to the patient's satisfaction.    Velora Heckler, MD, [facility] Office: [phone]   Kody Vigil Judie Petit

## 2013-01-18 NOTE — Anesthesia Procedure Notes (Signed)
Procedure Name: MAC Date/Time: 01/18/2013 11:46 AM Performed by: Zenia Resides D Pre-anesthesia Checklist: Patient identified, Timeout performed, Emergency Drugs available, Suction available and Patient being monitored Oxygen Delivery Method: Simple face mask Intubation Type: IV induction

## 2013-01-18 NOTE — Anesthesia Postprocedure Evaluation (Signed)
  Anesthesia Post-op Note  Patient: [name]  Procedure(s) Performed: Procedure(s): EXCISION CYSTS FROM BACK (TWO) (Bilateral)  Patient Location: PACU  Anesthesia Type:MAC  Level of Consciousness: awake, alert  and oriented  Airway and Oxygen Therapy: Patient Spontanous Breathing  Post-op Pain: none  Post-op Assessment: Post-op Vital signs reviewed  Post-op Vital Signs: Reviewed  Complications: No apparent anesthesia complications

## 2013-01-18 NOTE — Anesthesia Preprocedure Evaluation (Signed)
Anesthesia Evaluation  Patient identified by MRN, date of birth, ID band Patient awake    Reviewed: Allergy & Precautions, H&P , NPO status , Patient's Chart, lab work & pertinent test results  History of Anesthesia Complications (+) PONV  Airway Mallampati: I TM Distance: >3 FB Neck ROM: Full    Dental  (+) Teeth Intact and Dental Advisory Given   Pulmonary  breath sounds clear to auscultation        Cardiovascular hypertension, Pt. on medications Rhythm:Regular Rate:Normal     Neuro/Psych    GI/Hepatic GERD-  Medicated and Controlled,  Endo/Other    Renal/GU      Musculoskeletal   Abdominal   Peds  Hematology   Anesthesia Other Findings   Reproductive/Obstetrics                           Anesthesia Physical Anesthesia Plan  ASA: II  Anesthesia Plan: MAC   Post-op Pain Management:    Induction: Intravenous  Airway Management Planned: Simple Face Mask  Additional Equipment:   Intra-op Plan:   Post-operative Plan:   Informed Consent: I have reviewed the patients History and Physical, chart, labs and discussed the procedure including the risks, benefits and alternatives for the proposed anesthesia with the patient or authorized representative who has indicated his/her understanding and acceptance.     Plan Discussed with: CRNA, Anesthesiologist and Surgeon  Anesthesia Plan Comments:         Anesthesia Quick Evaluation

## 2013-01-18 NOTE — H&P (View-Only) (Signed)
General Surgery [facility]  Visit Diagnoses: 1. Abscess of back   2. Epidermal cyst     HISTORY: Patient is a 67 year old male who returns for evaluation of a cyst on the lower back which has repeatedly abscessed. He underwent incision and drainage one month ago. It has now resolved nicely. He is interested in definitive excision to prevent recurrence. He also has a second smaller epidermal cyst in the upper back near the midline which he desires to be excised concurrently.  EXAM: Surgical wound in the lower back has healed completely. There is no fluctuance. There is no residual infection. In the upper back is approximately a 1 cm epidermal inclusion cyst with a prominent sinus tract. No sign of acute infection.  IMPRESSION: History of abscess, epidermal inclusion cysts of the back  PLAN: Patient would like to undergo definitive excision of both lesions. This can be done as an outpatient procedure. We will make arrangements for surgery at a time convenient for him in the future. We have discussed the size and location of the surgical wounds. We discussed the possibility of postoperative infection. He understands and wishes to proceed.  The risks and benefits of the procedure have been discussed at length with the patient.  The patient understands the proposed procedure, potential alternative treatments, and the course of recovery to be expected.  All of the patient's questions have been answered at this time.  The patient wishes to proceed with surgery.  Velora Heckler, MD, FACS General & Endocrine Surgery [facility]

## 2013-01-18 NOTE — Brief Op Note (Signed)
01/18/2013  12:27 PM  PATIENT:  Sharrie Rothman  67 y.o. male  PRE-OPERATIVE DIAGNOSIS:  epidermal inclusion cysts with hx of abscess     POST-OPERATIVE DIAGNOSIS:  epidermal inclusion cysts with hx of abscess     PROCEDURE:  Procedure(s): EXCISION CYSTS FROM BACK (TWO) (Bilateral)  SURGEON:  Surgeon(s) and Role:    * Velora Heckler, MD - Primary  ANESTHESIA:   IV sedation  EBL:     BLOOD ADMINISTERED:none  DRAINS: none   LOCAL MEDICATIONS USED:  MARCAINE     SPECIMEN:  Excision  DISPOSITION OF SPECIMEN:  PATHOLOGY  COUNTS:  YES  TOURNIQUET:  * No tourniquets in log *  DICTATION: .Other Dictation: Dictation Number [id number]  PLAN OF CARE: Discharge to home after PACU  PATIENT DISPOSITION:  PACU - hemodynamically stable.   Delay start of Pharmacological VTE agent (>24hrs) due to surgical blood loss or risk of bleeding: yes  Velora Heckler, MD, [facility] Office: [phone]

## 2013-01-18 NOTE — Transfer of Care (Signed)
Immediate Anesthesia Transfer of Care Note  Patient: [name]  Procedure(s) Performed: Procedure(s): EXCISION CYSTS FROM BACK (TWO) (Bilateral)  Patient Location: PACU  Anesthesia Type:MAC  Level of Consciousness: awake, alert  and oriented  Airway & Oxygen Therapy: Patient Spontanous Breathing and Patient connected to nasal cannula oxygen  Post-op Assessment: Report given to PACU RN and Post -op Vital signs reviewed and stable  Post vital signs: Reviewed and stable  Complications: No apparent anesthesia complications

## 2013-01-19 ENCOUNTER — Encounter (HOSPITAL_BASED_OUTPATIENT_CLINIC_OR_DEPARTMENT_OTHER): Payer: Self-pay | Admitting: Surgery

## 2013-01-19 NOTE — Op Note (Signed)
NAME:  [name], [name]                     ACCOUNT NO.:  MEDICAL RECORD NO.:  [id number]  LOCATION:                                 FACILITY:  PHYSICIAN:  Velora Heckler, MD      DATE OF BIRTH:  Jun 14, 1946  DATE OF PROCEDURE:  01/18/2013                               OPERATIVE REPORT   PREOPERATIVE DIAGNOSES:  Epidermal inclusion cyst, history of abscess, left lower and upper back.  POSTOPERATIVE DIAGNOSES:  Epidermal inclusion cyst, history of abscess, left lower and upper back.  PROCEDURE: 1. Excision epidermal cyst, left lower back (2.0 x 1.5 x 1.0 cm). 2. Excision epidermal cyst, left upper back (1.0 x 1.0 x 1.0 cm).  SURGEON:  Velora Heckler, MD, FACS  ANESTHESIA:  Local with intravenous sedation.  PREPARATION:  ChloraPrep.  COMPLICATIONS:  None.  INDICATIONS:  The patient is a 67 year old male with a history of abscess and cellulitis involving epidermal inclusion cyst on the back. He has undergone previous incision and drainage procedures.  He now comes to Surgery for definitive excision.  BODY OF REPORT:  Procedure was done in OR #8 at the [facility].  The patient was brought to the operating room, placed in a prone position on the operating room table.  Following administration of intravenous sedation, the patient is prepped and draped in the usual aseptic fashion.  After ascertaining that an adequate level of sedation had been achieved, the skin overlying the cyst in the left lower back is anesthetized with local anesthetic.  Using a #15 blade, an elliptical incision was made so as to encompass the previous incision and drainage site.  Dissection was carried sharply into the subcutaneous tissues and the entire cyst is excised.  It is submitted to Pathology for review. Hemostasis was achieved with the electrocautery.  Skin edges are reapproximated with interrupted 3-0 Vicryl subcuticular sutures.  Next, we anesthetized the site of the cyst in the  left upper back. Again using a #15 blade, an elliptical incision was made so as to encompass the cyst and the sinus tract to the skin.  Dissection was carried into the subcutaneous tissues and the entire cyst was excised sharply.  It was also submitted to Pathology for review.  Hemostasis was achieved with the electrocautery.  Skin edges were reapproximated with interrupted 3-0 Vicryl subcuticular sutures. Wounds were washed and dried and benzoin and Steri-Strips were applied to both wounds.  Dry gauze dressings were placed.  The patient was awakened from anesthesia and brought to the recovery room.  The patient tolerated the procedure well.   Velora Heckler, MD, [facility] Office: [phone]    TMG/MEDQ  D:  01/18/2013  T:  01/18/2013  Job:  [id number]  cc:   Stacie Glaze, MD

## 2013-01-20 NOTE — Progress Notes (Signed)
Quick Note:  Please contact patient and notify of benign pathology results.  Sameera Betton M. Tao Satz, MD, FACS [facility] Office: [phone]   ______ 

## 2013-01-21 ENCOUNTER — Telehealth (INDEPENDENT_AMBULATORY_CARE_PROVIDER_SITE_OTHER): Payer: Self-pay

## 2013-01-21 NOTE — Telephone Encounter (Signed)
Message copied by Aviya Jarvie on Fri Jan 21, 2013  9:04 AM ------      Message from: GERKIN, TODD M      Created: Thu Jan 20, 2013 10:09 AM       Please contact patient and notify of benign pathology results.            Todd M. Gerkin, MD, FACS      [facility]      Office: [phone]             ------ 

## 2013-01-21 NOTE — Telephone Encounter (Signed)
Pt home doing well. Pt advised of pathology result.

## 2013-02-08 ENCOUNTER — Ambulatory Visit (INDEPENDENT_AMBULATORY_CARE_PROVIDER_SITE_OTHER): Payer: Medicare Other | Admitting: Surgery

## 2013-02-08 ENCOUNTER — Encounter (INDEPENDENT_AMBULATORY_CARE_PROVIDER_SITE_OTHER): Payer: Self-pay | Admitting: Surgery

## 2013-02-08 VITALS — BP 108/70 | HR 96 | Resp 16 | Ht 68.0 in | Wt 210.6 lb

## 2013-02-08 DIAGNOSIS — L02219 Cutaneous abscess of trunk, unspecified: Secondary | ICD-10-CM

## 2013-02-08 DIAGNOSIS — L723 Sebaceous cyst: Secondary | ICD-10-CM

## 2013-02-08 DIAGNOSIS — L02212 Cutaneous abscess of back [any part, except buttock]: Secondary | ICD-10-CM

## 2013-02-08 DIAGNOSIS — L720 Epidermal cyst: Secondary | ICD-10-CM

## 2013-02-08 NOTE — Progress Notes (Signed)
General Surgery [facility]  Visit Diagnoses: 1. Epidermal cyst   2. Abscess of back     HISTORY: The patient is a 67 year old male who underwent excision of 2 epidermal inclusion cyst from the back on 01/18/2013. Patient had had a history of abscess and cellulitis. Final pathology shows benign epidermal cyst with chronic inflammation. No malignancy was identified. Patient had minimal pain following his procedure. He has had no complications.  EXAM: Surgical incisions are nicely healed. No sign of seroma or infection.  IMPRESSION: Status post excision of epidermal inclusion cyst from the back  PLAN: Patient will apply topical creams to his incisions. He will return for surgical care as needed.  Velora Heckler, MD, FACS General & Endocrine Surgery [facility]

## 2013-02-08 NOTE — Patient Instructions (Signed)
  COCOA BUTTER & VITAMIN E CREAM  ([employer] or other brand)  Apply cocoa butter/vitamin E cream to your incision 2 - 3 times daily.  Massage cream into incision for one minute with each application.  Use sunscreen (50 SPF or higher) for first 6 months after surgery if area is exposed to sun.  You may substitute Mederma or other scar reducing creams as desired.   

## 2013-02-23 DIAGNOSIS — IMO0002 Reserved for concepts with insufficient information to code with codable children: Secondary | ICD-10-CM | POA: Diagnosis not present

## 2013-02-23 DIAGNOSIS — IMO0001 Reserved for inherently not codable concepts without codable children: Secondary | ICD-10-CM | POA: Diagnosis not present

## 2013-02-23 DIAGNOSIS — M545 Low back pain, unspecified: Secondary | ICD-10-CM | POA: Diagnosis not present

## 2013-02-23 DIAGNOSIS — M542 Cervicalgia: Secondary | ICD-10-CM | POA: Diagnosis not present

## 2013-05-05 DIAGNOSIS — M5126 Other intervertebral disc displacement, lumbar region: Secondary | ICD-10-CM | POA: Diagnosis not present

## 2013-05-05 DIAGNOSIS — M5430 Sciatica, unspecified side: Secondary | ICD-10-CM | POA: Diagnosis not present

## 2013-05-05 DIAGNOSIS — M545 Low back pain, unspecified: Secondary | ICD-10-CM | POA: Diagnosis not present

## 2013-05-24 DIAGNOSIS — L570 Actinic keratosis: Secondary | ICD-10-CM | POA: Diagnosis not present

## 2013-05-24 DIAGNOSIS — L408 Other psoriasis: Secondary | ICD-10-CM | POA: Diagnosis not present

## 2013-06-13 DIAGNOSIS — M5430 Sciatica, unspecified side: Secondary | ICD-10-CM | POA: Diagnosis not present

## 2013-06-13 DIAGNOSIS — M545 Low back pain, unspecified: Secondary | ICD-10-CM | POA: Diagnosis not present

## 2013-06-13 DIAGNOSIS — M5126 Other intervertebral disc displacement, lumbar region: Secondary | ICD-10-CM | POA: Diagnosis not present

## 2013-07-13 DIAGNOSIS — M25569 Pain in unspecified knee: Secondary | ICD-10-CM | POA: Diagnosis not present

## 2013-07-13 DIAGNOSIS — M722 Plantar fascial fibromatosis: Secondary | ICD-10-CM | POA: Diagnosis not present

## 2013-07-13 DIAGNOSIS — Z96659 Presence of unspecified artificial knee joint: Secondary | ICD-10-CM | POA: Diagnosis not present

## 2013-07-21 DIAGNOSIS — M542 Cervicalgia: Secondary | ICD-10-CM | POA: Diagnosis not present

## 2013-07-21 DIAGNOSIS — IMO0001 Reserved for inherently not codable concepts without codable children: Secondary | ICD-10-CM | POA: Diagnosis not present

## 2013-07-21 DIAGNOSIS — M545 Low back pain, unspecified: Secondary | ICD-10-CM | POA: Diagnosis not present

## 2013-07-21 DIAGNOSIS — M5430 Sciatica, unspecified side: Secondary | ICD-10-CM | POA: Diagnosis not present

## 2013-07-21 DIAGNOSIS — M5126 Other intervertebral disc displacement, lumbar region: Secondary | ICD-10-CM | POA: Diagnosis not present

## 2013-07-27 ENCOUNTER — Ambulatory Visit: Payer: Medicare Other | Attending: Orthopaedic Surgery | Admitting: Physical Therapy

## 2013-07-27 DIAGNOSIS — M542 Cervicalgia: Secondary | ICD-10-CM | POA: Diagnosis not present

## 2013-07-27 DIAGNOSIS — M25519 Pain in unspecified shoulder: Secondary | ICD-10-CM | POA: Insufficient documentation

## 2013-07-27 DIAGNOSIS — M545 Low back pain, unspecified: Secondary | ICD-10-CM | POA: Insufficient documentation

## 2013-07-27 DIAGNOSIS — R293 Abnormal posture: Secondary | ICD-10-CM | POA: Insufficient documentation

## 2013-07-27 DIAGNOSIS — IMO0001 Reserved for inherently not codable concepts without codable children: Secondary | ICD-10-CM | POA: Insufficient documentation

## 2013-07-27 DIAGNOSIS — R5381 Other malaise: Secondary | ICD-10-CM | POA: Diagnosis not present

## 2013-07-29 ENCOUNTER — Ambulatory Visit: Payer: Medicare Other | Admitting: *Deleted

## 2013-07-29 DIAGNOSIS — M542 Cervicalgia: Secondary | ICD-10-CM | POA: Diagnosis not present

## 2013-07-29 DIAGNOSIS — IMO0001 Reserved for inherently not codable concepts without codable children: Secondary | ICD-10-CM | POA: Diagnosis not present

## 2013-07-29 DIAGNOSIS — M25519 Pain in unspecified shoulder: Secondary | ICD-10-CM | POA: Diagnosis not present

## 2013-07-29 DIAGNOSIS — M545 Low back pain, unspecified: Secondary | ICD-10-CM | POA: Diagnosis not present

## 2013-07-29 DIAGNOSIS — R293 Abnormal posture: Secondary | ICD-10-CM | POA: Diagnosis not present

## 2013-07-29 DIAGNOSIS — R5381 Other malaise: Secondary | ICD-10-CM | POA: Diagnosis not present

## 2013-08-01 ENCOUNTER — Ambulatory Visit: Payer: Medicare Other | Attending: Orthopaedic Surgery | Admitting: Physical Therapy

## 2013-08-01 DIAGNOSIS — M542 Cervicalgia: Secondary | ICD-10-CM | POA: Insufficient documentation

## 2013-08-01 DIAGNOSIS — M25519 Pain in unspecified shoulder: Secondary | ICD-10-CM | POA: Insufficient documentation

## 2013-08-01 DIAGNOSIS — R293 Abnormal posture: Secondary | ICD-10-CM | POA: Diagnosis not present

## 2013-08-01 DIAGNOSIS — IMO0001 Reserved for inherently not codable concepts without codable children: Secondary | ICD-10-CM | POA: Diagnosis not present

## 2013-08-01 DIAGNOSIS — R5381 Other malaise: Secondary | ICD-10-CM | POA: Diagnosis not present

## 2013-08-01 DIAGNOSIS — M545 Low back pain, unspecified: Secondary | ICD-10-CM | POA: Diagnosis not present

## 2013-08-03 ENCOUNTER — Ambulatory Visit: Payer: Medicare Other | Admitting: Physical Therapy

## 2013-08-05 ENCOUNTER — Other Ambulatory Visit: Payer: Self-pay | Admitting: Physician Assistant

## 2013-08-05 DIAGNOSIS — C44221 Squamous cell carcinoma of skin of unspecified ear and external auricular canal: Secondary | ICD-10-CM | POA: Diagnosis not present

## 2013-08-05 DIAGNOSIS — D485 Neoplasm of uncertain behavior of skin: Secondary | ICD-10-CM | POA: Diagnosis not present

## 2013-08-05 DIAGNOSIS — L570 Actinic keratosis: Secondary | ICD-10-CM | POA: Diagnosis not present

## 2013-08-08 ENCOUNTER — Ambulatory Visit: Payer: Medicare Other | Admitting: Physical Therapy

## 2013-08-10 ENCOUNTER — Ambulatory Visit: Payer: Medicare Other | Admitting: Physical Therapy

## 2013-08-12 ENCOUNTER — Ambulatory Visit: Payer: Medicare Other | Admitting: *Deleted

## 2013-08-15 ENCOUNTER — Ambulatory Visit: Payer: Medicare Other | Admitting: *Deleted

## 2013-08-17 ENCOUNTER — Ambulatory Visit: Payer: Medicare Other | Admitting: Physical Therapy

## 2013-08-19 ENCOUNTER — Ambulatory Visit: Payer: Medicare Other | Admitting: *Deleted

## 2013-08-22 ENCOUNTER — Ambulatory Visit: Payer: Medicare Other | Admitting: Physical Therapy

## 2013-08-23 ENCOUNTER — Ambulatory Visit: Payer: Medicare Other | Admitting: Family Medicine

## 2013-08-24 ENCOUNTER — Ambulatory Visit: Payer: Medicare Other | Admitting: Physical Therapy

## 2013-09-01 DIAGNOSIS — C44221 Squamous cell carcinoma of skin of unspecified ear and external auricular canal: Secondary | ICD-10-CM | POA: Diagnosis not present

## 2013-09-01 DIAGNOSIS — M542 Cervicalgia: Secondary | ICD-10-CM | POA: Diagnosis not present

## 2013-09-01 DIAGNOSIS — IMO0001 Reserved for inherently not codable concepts without codable children: Secondary | ICD-10-CM | POA: Diagnosis not present

## 2013-09-01 DIAGNOSIS — M5126 Other intervertebral disc displacement, lumbar region: Secondary | ICD-10-CM | POA: Diagnosis not present

## 2013-09-09 ENCOUNTER — Ambulatory Visit: Payer: Medicare Other | Admitting: Family

## 2013-09-14 ENCOUNTER — Encounter: Payer: Self-pay | Admitting: Internal Medicine

## 2013-09-14 ENCOUNTER — Ambulatory Visit (INDEPENDENT_AMBULATORY_CARE_PROVIDER_SITE_OTHER): Payer: Medicare Other | Admitting: Internal Medicine

## 2013-09-14 VITALS — BP 120/80 | HR 89 | Temp 98.6°F | Resp 20 | Ht 68.0 in | Wt 209.0 lb

## 2013-09-14 DIAGNOSIS — I10 Essential (primary) hypertension: Secondary | ICD-10-CM

## 2013-09-14 DIAGNOSIS — M1990 Unspecified osteoarthritis, unspecified site: Secondary | ICD-10-CM

## 2013-09-14 DIAGNOSIS — M542 Cervicalgia: Secondary | ICD-10-CM | POA: Diagnosis not present

## 2013-09-14 DIAGNOSIS — M722 Plantar fascial fibromatosis: Secondary | ICD-10-CM | POA: Diagnosis not present

## 2013-09-14 NOTE — Progress Notes (Signed)
Subjective:    Patient ID: [name], male    DOB: 1945/12/06, 68 y.o.   MRN: [id number]  HPI   68 year old patient who has treated hypertension.  He also has chronic neck pain and has received further recent physical therapy for upper back and neck discomfort.  For the past 2, months she's also had considerable of right heel pain.  He states that approximately 25 years ago.  He received a local cortisone injection for similar heel pain with nice results.  He is requesting another injection. He has been given exercises for plantar fasciitis, which has not been very helpful. He is requesting a refill on amitriptyline and presently takes a dose of 125 mg at bedtime.  This has been chronic, which he has tolerated well  Past Medical History  Diagnosis Date  . HYPERLIPIDEMIA 12/24/2006  . HYPERTENSION 12/24/2006  . ALLERGIC RHINITIS CAUSE UNSPECIFIED 05/27/2010  . GERD 12/24/2006  . DIVERTICULOSIS, COLON 03/09/2007  . Other constipation 11/06/2009  . PROSTATITIS, RECURRENT 12/30/2007  . OSTEOARTHRITIS 12/24/2006  . SHOULDER PAIN, BILATERAL 07/16/2007  . Cervicalgia 05/27/2010  . BACK PAIN, UPPER 02/21/2010  . DIZZINESS, CHRONIC 03/09/2007  . INSOMNIA WITH SLEEP APNEA UNSPECIFIED 05/27/2010  . HEADACHE, SINUS 04/06/2009  . CHEST PAIN UNSPECIFIED 04/25/2009  . Open wound of wrist, complicated 12/17/5091  . ADVEF, DRUG/MEDICINAL/BIOLOGICAL SUBST NOS 03/09/2007  . TRANSIENT ISCHEMIC ATTACKS, HX OF 04/06/2009  . PONV (postoperative nausea and vomiting)     History   Social History  . Marital Status: Married    Spouse Name: N/A    Number of Children: N/A  . Years of Education: N/A   Occupational History  . Retired     Disabled   Social History Main Topics  . Smoking status: Former Smoker    Quit date: 08/15/1975  . Smokeless tobacco: Not on file     Comment: quit 35 years ago  . Alcohol Use: No  . Drug Use: No  . Sexual Activity: Yes   Other Topics Concern  . Not on file   Social  History Narrative  . No narrative on file    Past Surgical History  Procedure Laterality Date  . Replacement total knee bilateral  2005/2007  . Ankle arthroscopy w/ open repair  2002    Repair  . Tonsillectomy    . Colonoscopy    . Ear cyst excision Bilateral 01/18/2013    Procedure: EXCISION CYSTS FROM BACK (TWO);  Surgeon: Earnstine Regal, MD;  Location: [facility];  Service: General;  Laterality: Bilateral;    Family History  Problem Relation Age of Onset  . Achalasia Father   . Depression Father   . Heart disease Brother 12  . Kidney failure Mother   . Diabetes Sister     Allergies  Allergen Reactions  . Codeine     REACTION: Nausea    Current Outpatient Prescriptions on File Prior to Visit  Medication Sig Dispense Refill  . BENICAR HCT 20-12.5 MG per tablet TAKE ONE TABLET BY MOUTH EVERY DAY  30 tablet  0  . docusate sodium (COLACE) 100 MG capsule Take 100 mg by mouth nightly.        . Lactobacillus (ACIDOPHILUS EXTRA STRENGTH) CAPS Take by mouth daily.        Marland Kitchen loratadine (CLARITIN) 10 MG tablet Take 10 mg by mouth daily.        . meclizine (ANTIVERT) 12.5 MG tablet TAKE TWO TABLETS BY MOUTH EVERY  6 HOURS AS NEEDED  180 tablet  2  . omeprazole (PRILOSEC) 40 MG capsule TAKE ONE CAPSULE BY MOUTH EVERY DAY  90 capsule  2  . Probiotic Product ([place]) CAPS Take by mouth 2 (two) times daily.        . valACYclovir (VALTREX) 500 MG tablet Take 1 tablet (500 mg total) by mouth 2 (two) times daily.  30 tablet  3   No current facility-administered medications on file prior to visit.    BP 120/80  Pulse 89  Temp(Src) 98.6 F (37 C) (Oral)  Resp 20  Ht 5\' 8"  (1.727 m)  Wt 209 lb (94.802 kg)  BMI 31.79 kg/m2  SpO2 98%       Review of Systems  Constitutional: Negative for fever, chills, appetite change and fatigue.  HENT: Negative for congestion, dental problem, ear pain, hearing loss, sore throat, tinnitus, trouble swallowing and voice  change.   Eyes: Negative for pain, discharge and visual disturbance.  Respiratory: Negative for cough, chest tightness, wheezing and stridor.   Cardiovascular: Negative for chest pain, palpitations and leg swelling.  Gastrointestinal: Negative for nausea, vomiting, abdominal pain, diarrhea, constipation, blood in stool and abdominal distention.  Genitourinary: Negative for urgency, hematuria, flank pain, discharge, difficulty urinating and genital sores.  Musculoskeletal: Positive for neck pain and neck stiffness. Negative for arthralgias, back pain, gait problem, joint swelling and myalgias.       Right heel pain  Skin: Negative for rash.  Neurological: Negative for dizziness, syncope, speech difficulty, weakness, numbness and headaches.  Hematological: Negative for adenopathy. Does not bruise/bleed easily.  Psychiatric/Behavioral: Negative for behavioral problems and dysphoric mood. The patient is not nervous/anxious.        Objective:   Physical Exam  Constitutional: He appears well-developed and well-nourished. No distress.  Blood pressure 120/80  Musculoskeletal:  Mild tenderness involving the right heel area medially          Assessment & Plan:   Right plantar fasciitis Hypertension Chronic neck pain  Procedure note.  After local skin prep and verbal consent, 1 cc of 40 mg Depo-Medrol and 2 cc of 1% Xylocaine injected subcutaneously along the medial aspect of the right heel.  Patient tolerated procedure well

## 2013-09-14 NOTE — Patient Instructions (Signed)
Call or return to clinic prn if these symptoms worsen or fail to improve as anticipated.  Plantar Fasciitis Plantar fasciitis is a common condition that causes foot pain. It is soreness (inflammation) of the band of tough fibrous tissue on the bottom of the foot that runs from the heel bone (calcaneus) to the ball of the foot. The cause of this soreness may be from excessive standing, poor fitting shoes, running on hard surfaces, being overweight, having an abnormal walk, or overuse (this is common in runners) of the painful foot or feet. It is also common in aerobic exercise dancers and ballet dancers. SYMPTOMS  Most people with plantar fasciitis complain of:  Severe pain in the morning on the bottom of their foot especially when taking the first steps out of bed. This pain recedes after a few minutes of walking.  Severe pain is experienced also during walking following a long period of inactivity.  Pain is worse when walking barefoot or up stairs DIAGNOSIS   Your caregiver will diagnose this condition by examining and feeling your foot.  Special tests such as X-rays of your foot, are usually not needed. PREVENTION   Consult a sports medicine professional before beginning a new exercise program.  Walking programs offer a good workout. With walking there is a lower chance of overuse injuries common to runners. There is less impact and less jarring of the joints.  Begin all new exercise programs slowly. If problems or pain develop, decrease the amount of time or distance until you are at a comfortable level.  Wear good shoes and replace them regularly.  Stretch your foot and the heel cords at the back of the ankle (Achilles tendon) both before and after exercise.  Run or exercise on even surfaces that are not hard. For example, asphalt is better than pavement.  Do not run barefoot on hard surfaces.  If using a treadmill, vary the incline.  Do not continue to workout if you have foot  or joint problems. Seek professional help if they do not improve. HOME CARE INSTRUCTIONS   Avoid activities that cause you pain until you recover.  Use ice or cold packs on the problem or painful areas after working out.  Only take over-the-counter or prescription medicines for pain, discomfort, or fever as directed by your caregiver.  Soft shoe inserts or athletic shoes with air or gel sole cushions may be helpful.  If problems continue or become more severe, consult a sports medicine caregiver or your own health care provider. Cortisone is a potent anti-inflammatory medication that may be injected into the painful area. You can discuss this treatment with your caregiver. MAKE SURE YOU:   Understand these instructions.  Will watch your condition.  Will get help right away if you are not doing well or get worse. Document Released: 03/11/2001 Document Revised: 09/08/2011 Document Reviewed: 05/10/2008 [facility] Patient Information 2014 [place], [place].

## 2013-09-14 NOTE — Progress Notes (Signed)
Pre-visit discussion using our clinic review tool. No additional management support is needed unless otherwise documented below in the visit note.  

## 2013-09-15 ENCOUNTER — Other Ambulatory Visit: Payer: Self-pay | Admitting: Internal Medicine

## 2013-09-15 ENCOUNTER — Telehealth: Payer: Self-pay | Admitting: Internal Medicine

## 2013-09-15 NOTE — Telephone Encounter (Signed)
Relevant patient education mailed to patient.  

## 2013-10-13 DIAGNOSIS — M5126 Other intervertebral disc displacement, lumbar region: Secondary | ICD-10-CM | POA: Diagnosis not present

## 2013-10-13 DIAGNOSIS — M19049 Primary osteoarthritis, unspecified hand: Secondary | ICD-10-CM | POA: Diagnosis not present

## 2013-10-13 DIAGNOSIS — M542 Cervicalgia: Secondary | ICD-10-CM | POA: Diagnosis not present

## 2013-10-13 DIAGNOSIS — M47812 Spondylosis without myelopathy or radiculopathy, cervical region: Secondary | ICD-10-CM | POA: Diagnosis not present

## 2013-12-14 DIAGNOSIS — M47812 Spondylosis without myelopathy or radiculopathy, cervical region: Secondary | ICD-10-CM | POA: Diagnosis not present

## 2013-12-14 DIAGNOSIS — M5126 Other intervertebral disc displacement, lumbar region: Secondary | ICD-10-CM | POA: Diagnosis not present

## 2013-12-14 DIAGNOSIS — IMO0002 Reserved for concepts with insufficient information to code with codable children: Secondary | ICD-10-CM | POA: Diagnosis not present

## 2013-12-26 ENCOUNTER — Other Ambulatory Visit: Payer: Self-pay | Admitting: Dermatology

## 2013-12-26 DIAGNOSIS — L570 Actinic keratosis: Secondary | ICD-10-CM | POA: Diagnosis not present

## 2013-12-26 DIAGNOSIS — D0420 Carcinoma in situ of skin of unspecified ear and external auricular canal: Secondary | ICD-10-CM | POA: Diagnosis not present

## 2013-12-28 DIAGNOSIS — IMO0002 Reserved for concepts with insufficient information to code with codable children: Secondary | ICD-10-CM | POA: Diagnosis not present

## 2013-12-28 DIAGNOSIS — M5126 Other intervertebral disc displacement, lumbar region: Secondary | ICD-10-CM | POA: Diagnosis not present

## 2014-01-02 ENCOUNTER — Other Ambulatory Visit: Payer: Self-pay | Admitting: Internal Medicine

## 2014-01-18 DIAGNOSIS — M5126 Other intervertebral disc displacement, lumbar region: Secondary | ICD-10-CM | POA: Diagnosis not present

## 2014-01-18 DIAGNOSIS — M47812 Spondylosis without myelopathy or radiculopathy, cervical region: Secondary | ICD-10-CM | POA: Diagnosis not present

## 2014-01-18 DIAGNOSIS — IMO0002 Reserved for concepts with insufficient information to code with codable children: Secondary | ICD-10-CM | POA: Diagnosis not present

## 2014-01-18 DIAGNOSIS — M5030 Other cervical disc degeneration, unspecified cervical region: Secondary | ICD-10-CM | POA: Diagnosis not present

## 2014-01-19 ENCOUNTER — Other Ambulatory Visit: Payer: Self-pay | Admitting: Rehabilitation

## 2014-01-19 DIAGNOSIS — M5030 Other cervical disc degeneration, unspecified cervical region: Secondary | ICD-10-CM

## 2014-01-27 ENCOUNTER — Ambulatory Visit
Admission: RE | Admit: 2014-01-27 | Discharge: 2014-01-27 | Disposition: A | Discharge status: Home / Self Care | Payer: Medicare Other | Source: Ambulatory Visit | Attending: Rehabilitation | Admitting: Rehabilitation

## 2014-01-27 DIAGNOSIS — M5030 Other cervical disc degeneration, unspecified cervical region: Secondary | ICD-10-CM

## 2014-01-27 DIAGNOSIS — M47812 Spondylosis without myelopathy or radiculopathy, cervical region: Secondary | ICD-10-CM | POA: Diagnosis not present

## 2014-02-08 ENCOUNTER — Other Ambulatory Visit: Payer: Self-pay | Admitting: Internal Medicine

## 2014-02-22 ENCOUNTER — Telehealth: Payer: Self-pay | Admitting: Internal Medicine

## 2014-02-22 DIAGNOSIS — M47812 Spondylosis without myelopathy or radiculopathy, cervical region: Secondary | ICD-10-CM | POA: Diagnosis not present

## 2014-02-22 DIAGNOSIS — M5412 Radiculopathy, cervical region: Secondary | ICD-10-CM | POA: Diagnosis not present

## 2014-02-22 NOTE — Telephone Encounter (Signed)
Pt request refill of the following:  omeprazole (PRILOSEC) 40 MG capsule   Pt having a bad case of indigestion    Phamacy:  [facility]  [place]  [place]

## 2014-02-23 MED ORDER — OMEPRAZOLE 40 MG PO CPDR: DELAYED_RELEASE_CAPSULE | ORAL | Status: DC

## 2014-02-23 NOTE — Telephone Encounter (Signed)
rx sent in electronically 

## 2014-02-27 ENCOUNTER — Emergency Department (HOSPITAL_COMMUNITY): Payer: Medicare Other

## 2014-02-27 ENCOUNTER — Encounter (HOSPITAL_COMMUNITY): Payer: Self-pay | Admitting: Emergency Medicine

## 2014-02-27 ENCOUNTER — Inpatient Hospital Stay (HOSPITAL_COMMUNITY): Payer: Medicare Other

## 2014-02-27 ENCOUNTER — Inpatient Hospital Stay (HOSPITAL_COMMUNITY)
Admission: EM | Admit: 2014-02-27 | Discharge: 2014-03-06 | DRG: 871 | Disposition: A | Discharge status: Home Health | Payer: Medicare Other | Attending: Internal Medicine | Admitting: Internal Medicine

## 2014-02-27 DIAGNOSIS — I10 Essential (primary) hypertension: Secondary | ICD-10-CM | POA: Diagnosis not present

## 2014-02-27 DIAGNOSIS — E861 Hypovolemia: Secondary | ICD-10-CM | POA: Diagnosis present

## 2014-02-27 DIAGNOSIS — K219 Gastro-esophageal reflux disease without esophagitis: Secondary | ICD-10-CM

## 2014-02-27 DIAGNOSIS — Z8709 Personal history of other diseases of the respiratory system: Secondary | ICD-10-CM

## 2014-02-27 DIAGNOSIS — R7309 Other abnormal glucose: Secondary | ICD-10-CM | POA: Diagnosis present

## 2014-02-27 DIAGNOSIS — E785 Hyperlipidemia, unspecified: Secondary | ICD-10-CM | POA: Diagnosis present

## 2014-02-27 DIAGNOSIS — A7740 Ehrlichiosis, unspecified: Secondary | ICD-10-CM

## 2014-02-27 DIAGNOSIS — A419 Sepsis, unspecified organism: Secondary | ICD-10-CM | POA: Diagnosis not present

## 2014-02-27 DIAGNOSIS — R509 Fever, unspecified: Secondary | ICD-10-CM | POA: Diagnosis present

## 2014-02-27 DIAGNOSIS — Z79899 Other long term (current) drug therapy: Secondary | ICD-10-CM | POA: Diagnosis not present

## 2014-02-27 DIAGNOSIS — R51 Headache: Secondary | ICD-10-CM | POA: Diagnosis not present

## 2014-02-27 DIAGNOSIS — D72829 Elevated white blood cell count, unspecified: Secondary | ICD-10-CM | POA: Diagnosis not present

## 2014-02-27 DIAGNOSIS — J9819 Other pulmonary collapse: Secondary | ICD-10-CM | POA: Diagnosis not present

## 2014-02-27 DIAGNOSIS — I498 Other specified cardiac arrhythmias: Secondary | ICD-10-CM | POA: Diagnosis not present

## 2014-02-27 DIAGNOSIS — Z8673 Personal history of transient ischemic attack (TIA), and cerebral infarction without residual deficits: Secondary | ICD-10-CM | POA: Diagnosis not present

## 2014-02-27 DIAGNOSIS — N419 Inflammatory disease of prostate, unspecified: Secondary | ICD-10-CM | POA: Diagnosis present

## 2014-02-27 DIAGNOSIS — J309 Allergic rhinitis, unspecified: Secondary | ICD-10-CM

## 2014-02-27 DIAGNOSIS — Z8679 Personal history of other diseases of the circulatory system: Secondary | ICD-10-CM

## 2014-02-27 DIAGNOSIS — K5730 Diverticulosis of large intestine without perforation or abscess without bleeding: Secondary | ICD-10-CM | POA: Diagnosis present

## 2014-02-27 DIAGNOSIS — R911 Solitary pulmonary nodule: Secondary | ICD-10-CM | POA: Diagnosis not present

## 2014-02-27 DIAGNOSIS — K625 Hemorrhage of anus and rectum: Secondary | ICD-10-CM | POA: Diagnosis present

## 2014-02-27 DIAGNOSIS — Z96659 Presence of unspecified artificial knee joint: Secondary | ICD-10-CM

## 2014-02-27 DIAGNOSIS — A6920 Lyme disease, unspecified: Secondary | ICD-10-CM

## 2014-02-27 DIAGNOSIS — B009 Herpesviral infection, unspecified: Secondary | ICD-10-CM | POA: Diagnosis present

## 2014-02-27 DIAGNOSIS — Z833 Family history of diabetes mellitus: Secondary | ICD-10-CM

## 2014-02-27 DIAGNOSIS — R6510 Systemic inflammatory response syndrome (SIRS) of non-infectious origin without acute organ dysfunction: Secondary | ICD-10-CM

## 2014-02-27 DIAGNOSIS — Z87891 Personal history of nicotine dependence: Secondary | ICD-10-CM | POA: Diagnosis not present

## 2014-02-27 DIAGNOSIS — G4700 Insomnia, unspecified: Secondary | ICD-10-CM

## 2014-02-27 DIAGNOSIS — G039 Meningitis, unspecified: Secondary | ICD-10-CM | POA: Diagnosis present

## 2014-02-27 DIAGNOSIS — R42 Dizziness and giddiness: Secondary | ICD-10-CM | POA: Diagnosis present

## 2014-02-27 DIAGNOSIS — E871 Hypo-osmolality and hyponatremia: Secondary | ICD-10-CM

## 2014-02-27 DIAGNOSIS — J189 Pneumonia, unspecified organism: Secondary | ICD-10-CM | POA: Diagnosis not present

## 2014-02-27 DIAGNOSIS — J329 Chronic sinusitis, unspecified: Secondary | ICD-10-CM | POA: Diagnosis not present

## 2014-02-27 DIAGNOSIS — G4730 Sleep apnea, unspecified: Secondary | ICD-10-CM

## 2014-02-27 DIAGNOSIS — E870 Hyperosmolality and hypernatremia: Secondary | ICD-10-CM

## 2014-02-27 LAB — C-REACTIVE PROTEIN: CRP: 41.3 mg/dL — ABNORMAL HIGH (ref <0.60)

## 2014-02-27 LAB — CREATININE, URINE, RANDOM: Creatinine, Urine: 51.93 mg/dL

## 2014-02-27 LAB — URINALYSIS, ROUTINE W REFLEX MICROSCOPIC
BILIRUBIN URINE: NEGATIVE
GLUCOSE, UA: NEGATIVE mg/dL
Ketones, ur: 15 mg/dL — AB
Leukocytes, UA: NEGATIVE
Nitrite: NEGATIVE
PH: 6 (ref 5.0–8.0)
Protein, ur: 100 mg/dL — AB
Specific Gravity, Urine: 1.012 (ref 1.005–1.030)
UROBILINOGEN UA: 1 mg/dL (ref 0.0–1.0)

## 2014-02-27 LAB — CBC WITH DIFFERENTIAL/PLATELET
BASOS PCT: 0 % (ref 0–1)
Basophils Absolute: 0 10*3/uL (ref 0.0–0.1)
EOS PCT: 0 % (ref 0–5)
Eosinophils Absolute: 0 10*3/uL (ref 0.0–0.7)
HEMATOCRIT: 42.9 % (ref 39.0–52.0)
HEMOGLOBIN: 15.4 g/dL (ref 13.0–17.0)
LYMPHS PCT: 8 % — AB (ref 12–46)
Lymphs Abs: 2.1 10*3/uL (ref 0.7–4.0)
MCH: 32.3 pg (ref 26.0–34.0)
MCHC: 35.9 g/dL (ref 30.0–36.0)
MCV: 89.9 fL (ref 78.0–100.0)
Monocytes Absolute: 1.3 10*3/uL — ABNORMAL HIGH (ref 0.1–1.0)
Monocytes Relative: 5 % (ref 3–12)
NEUTROS ABS: 23 10*3/uL — AB (ref 1.7–7.7)
NEUTROS PCT: 87 % — AB (ref 43–77)
Platelets: 204 10*3/uL (ref 150–400)
RBC: 4.77 MIL/uL (ref 4.22–5.81)
RDW: 13.3 % (ref 11.5–15.5)
WBC: 26.4 10*3/uL — ABNORMAL HIGH (ref 4.0–10.5)

## 2014-02-27 LAB — MAGNESIUM: Magnesium: 1.6 mg/dL (ref 1.5–2.5)

## 2014-02-27 LAB — CRYPTOCOCCAL ANTIGEN, CSF: Crypto Ag: NEGATIVE

## 2014-02-27 LAB — GRAM STAIN

## 2014-02-27 LAB — BASIC METABOLIC PANEL
Anion gap: 16 — ABNORMAL HIGH (ref 5–15)
BUN: 14 mg/dL (ref 6–23)
CO2: 23 meq/L (ref 19–32)
Calcium: 9.6 mg/dL (ref 8.4–10.5)
Chloride: 87 mEq/L — ABNORMAL LOW (ref 96–112)
Creatinine, Ser: 1.21 mg/dL (ref 0.50–1.35)
GFR calc Af Amer: 70 mL/min — ABNORMAL LOW (ref >90)
GFR, EST NON AFRICAN AMERICAN: 61 mL/min — AB (ref >90)
GLUCOSE: 156 mg/dL — AB (ref 70–99)
POTASSIUM: 4 meq/L (ref 3.7–5.3)
SODIUM: 126 meq/L — AB (ref 137–147)

## 2014-02-27 LAB — CSF CELL COUNT WITH DIFFERENTIAL
RBC Count, CSF: 15 /mm3 — ABNORMAL HIGH
RBC Count, CSF: 44 /mm3 — ABNORMAL HIGH
TUBE #: 1
TUBE #: 4
WBC, CSF: 3 /mm3 (ref 0–5)
WBC, CSF: 3 /mm3 (ref 0–5)

## 2014-02-27 LAB — PHOSPHORUS: Phosphorus: 1.6 mg/dL — ABNORMAL LOW (ref 2.3–4.6)

## 2014-02-27 LAB — PROTEIN, CSF: Total  Protein, CSF: 33 mg/dL (ref 15–45)

## 2014-02-27 LAB — TSH: TSH: 1.69 u[IU]/mL (ref 0.350–4.500)

## 2014-02-27 LAB — URINE MICROSCOPIC-ADD ON

## 2014-02-27 LAB — SEDIMENTATION RATE: Sed Rate: 68 mm/hr — ABNORMAL HIGH (ref 0–16)

## 2014-02-27 LAB — GLUCOSE, CSF: GLUCOSE CSF: 95 mg/dL — AB (ref 43–76)

## 2014-02-27 LAB — I-STAT CG4 LACTIC ACID, ED: Lactic Acid, Venous: 2.02 mmol/L (ref 0.5–2.2)

## 2014-02-27 LAB — MRSA PCR SCREENING: MRSA by PCR: NEGATIVE

## 2014-02-27 LAB — STREP PNEUMONIAE URINARY ANTIGEN: Strep Pneumo Urinary Antigen: NEGATIVE

## 2014-02-27 LAB — SODIUM, URINE, RANDOM: Sodium, Ur: 30 meq/L

## 2014-02-27 MED ORDER — ACETAMINOPHEN 325 MG PO TABS
650.0000 mg | ORAL_TABLET | ORAL | Status: DC | PRN
  Administered 2014-02-28 – 2014-03-02 (×8): 650 mg via ORAL
  Filled 2014-02-27 (×8): qty 2

## 2014-02-27 MED ORDER — DEXTROSE 5 % IV SOLN
1.0000 g | Freq: Two times a day (BID) | INTRAVENOUS | Status: DC
  Administered 2014-02-27: 1 g via INTRAVENOUS
  Filled 2014-02-27: qty 1

## 2014-02-27 MED ORDER — VANCOMYCIN HCL IN DEXTROSE 1-5 GM/200ML-% IV SOLN
1000.0000 mg | Freq: Once | INTRAVENOUS | Status: AC
  Administered 2014-02-27: 1000 mg via INTRAVENOUS
  Filled 2014-02-27: qty 200

## 2014-02-27 MED ORDER — MORPHINE SULFATE 2 MG/ML IJ SOLN: 2.0000 mg | INTRAMUSCULAR | Status: DC | PRN

## 2014-02-27 MED ORDER — SODIUM CHLORIDE 0.9 % IV SOLN
1000.0000 mL | INTRAVENOUS | Status: DC
  Administered 2014-02-27: 1000 mL via INTRAVENOUS

## 2014-02-27 MED ORDER — ACETAMINOPHEN 325 MG PO TABS
650.0000 mg | ORAL_TABLET | Freq: Four times a day (QID) | ORAL | Status: DC | PRN
  Administered 2014-02-27 (×2): 650 mg via ORAL
  Filled 2014-02-27 (×2): qty 2

## 2014-02-27 MED ORDER — DEXTROSE 5 % IV SOLN
2.0000 g | Freq: Once | INTRAVENOUS | Status: AC
  Administered 2014-02-27: 2 g via INTRAVENOUS
  Filled 2014-02-27: qty 2

## 2014-02-27 MED ORDER — AMITRIPTYLINE HCL 50 MG PO TABS
50.0000 mg | ORAL_TABLET | Freq: Every day | ORAL | Status: DC
  Administered 2014-02-27 – 2014-03-05 (×7): 50 mg via ORAL
  Filled 2014-02-27 (×8): qty 1

## 2014-02-27 MED ORDER — PROMETHAZINE HCL 50 MG PO TABS: 100.0000 mg | ORAL_TABLET | Freq: Four times a day (QID) | ORAL | Status: DC | PRN

## 2014-02-27 MED ORDER — ENOXAPARIN SODIUM 40 MG/0.4ML ~~LOC~~ SOLN
40.0000 mg | SUBCUTANEOUS | Status: DC
  Administered 2014-02-27 – 2014-03-05 (×6): 40 mg via SUBCUTANEOUS
  Filled 2014-02-27 (×8): qty 0.4

## 2014-02-27 MED ORDER — SODIUM CHLORIDE 0.9 % IV BOLUS (SEPSIS)
30.0000 mL/kg | Freq: Once | INTRAVENOUS | Status: AC
  Administered 2014-02-27: 2682 mL via INTRAVENOUS

## 2014-02-27 MED ORDER — RISAQUAD PO CAPS
1.0000 | ORAL_CAPSULE | Freq: Every day | ORAL | Status: DC
  Filled 2014-02-27: qty 1

## 2014-02-27 MED ORDER — DEXTROSE 5 % IV SOLN: 2.0000 g | INTRAVENOUS | Status: DC

## 2014-02-27 MED ORDER — PHILLIPS COLON HEALTH PO CAPS: 1.0000 | ORAL_CAPSULE | Freq: Every day | ORAL | Status: DC

## 2014-02-27 MED ORDER — DEXTROSE 5 % IV SOLN: 1.0000 g | INTRAVENOUS | Status: DC

## 2014-02-27 MED ORDER — DOXYCYCLINE HYCLATE 100 MG IV SOLR
100.0000 mg | Freq: Two times a day (BID) | INTRAVENOUS | Status: DC
  Administered 2014-02-27 – 2014-03-01 (×4): 100 mg via INTRAVENOUS
  Filled 2014-02-27 (×6): qty 100

## 2014-02-27 MED ORDER — AMITRIPTYLINE HCL 25 MG PO TABS
25.0000 mg | ORAL_TABLET | Freq: Two times a day (BID) | ORAL | Status: DC
  Filled 2014-02-27: qty 1

## 2014-02-27 MED ORDER — DEXTROSE 5 % IV SOLN: 500.0000 mg | Freq: Once | INTRAVENOUS | Status: DC

## 2014-02-27 MED ORDER — SALINE SPRAY 0.65 % NA SOLN
2.0000 | Freq: Every day | NASAL | Status: DC
  Administered 2014-03-02 – 2014-03-04 (×3): 2 via NASAL
  Filled 2014-02-27 (×2): qty 44

## 2014-02-27 MED ORDER — AMITRIPTYLINE HCL 25 MG PO TABS
25.0000 mg | ORAL_TABLET | Freq: Every day | ORAL | Status: DC
  Filled 2014-02-27: qty 1

## 2014-02-27 MED ORDER — SODIUM CHLORIDE 0.9 % IV BOLUS (SEPSIS)
500.0000 mL | Freq: Once | INTRAVENOUS | Status: AC
  Administered 2014-02-27: 500 mL via INTRAVENOUS

## 2014-02-27 MED ORDER — MECLIZINE HCL 25 MG PO TABS
25.0000 mg | ORAL_TABLET | Freq: Four times a day (QID) | ORAL | Status: DC | PRN
  Filled 2014-02-27: qty 1

## 2014-02-27 MED ORDER — IOHEXOL 300 MG/ML  SOLN
75.0000 mL | Freq: Once | INTRAMUSCULAR | Status: AC | PRN
  Administered 2014-02-27: 75 mL via INTRAVENOUS

## 2014-02-27 MED ORDER — DEXTROSE 5 % IV SOLN
5.0000 mg/kg | Freq: Once | INTRAVENOUS | Status: AC
  Administered 2014-02-27: 445 mg via INTRAVENOUS
  Filled 2014-02-27: qty 8.9

## 2014-02-27 MED ORDER — DEXTROSE 5 % IV SOLN
2.0000 g | Freq: Two times a day (BID) | INTRAVENOUS | Status: DC
  Administered 2014-02-28 – 2014-03-01 (×3): 2 g via INTRAVENOUS
  Filled 2014-02-27 (×4): qty 2

## 2014-02-27 MED ORDER — RISAQUAD PO CAPS
2.0000 | ORAL_CAPSULE | Freq: Every day | ORAL | Status: DC
  Administered 2014-02-27 – 2014-03-06 (×8): 2 via ORAL
  Filled 2014-02-27 (×8): qty 2

## 2014-02-27 MED ORDER — SODIUM CHLORIDE 0.9 % IV SOLN
INTRAVENOUS | Status: DC
  Administered 2014-02-27 – 2014-03-01 (×3): via INTRAVENOUS

## 2014-02-27 MED ORDER — PROMETHAZINE HCL 25 MG PO TABS: 50.0000 mg | ORAL_TABLET | Freq: Four times a day (QID) | ORAL | Status: DC | PRN

## 2014-02-27 MED ORDER — ACETAMINOPHEN 325 MG PO TABS
650.0000 mg | ORAL_TABLET | ORAL | Status: AC
  Administered 2014-02-27: 650 mg via ORAL

## 2014-02-27 MED ORDER — ACIDOPHILUS EXTRA STRENGTH PO CAPS: 1.0000 | ORAL_CAPSULE | Freq: Every day | ORAL | Status: DC

## 2014-02-27 NOTE — ED Notes (Signed)
MD at bedside. 

## 2014-02-27 NOTE — ED Notes (Signed)
Family at bedside. 

## 2014-02-27 NOTE — ED Notes (Signed)
Called for patient.  Patient in bathroom.

## 2014-02-27 NOTE — ED Notes (Signed)
Admitting MD at bedside.

## 2014-02-27 NOTE — H&P (Addendum)
[facility] History and Physical  [name] YKD:[id number] DOB: 04-May-1946 DOA: 02/27/2014  Referring physician:  PCP: Georgetta Haber, MD  Specialists:   Chief Complaint: Fever, chills, headache  HPI: [name] is a 68 y.o. male  With a history of sinusitis, dizziness, who presented to the emergency department with complaints of fever, chills, and headache for 5 days. Patient began having fever and chills approximately one week ago and presented to urgent care today at which point he was noted to be tachycardic and febrile. Patient was then sent to the emergency department for further workup. 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 He does admit to being at the [facility] several times, as they do have acute. Patient does admit swimming in [place] admitting bit by mosquitoes. Patient denies any sick contacts, recent sickness or illness, chest pain, abdominal pain. Patient does admit to having some loose stools. He also has had a cough but denies any shortness of breath.  Review of Systems:  Constitutional: Complains of fever and chills.   HEENT: Complains of headache and has a history of sinusitis. Respiratory: Complains of cough. Cardiovascular: Denies chest pain, palpitations and leg swelling.  Gastrointestinal: Complains of some loose stools, denies any abdominal pain, constipation, nausea or vomiting. Does complain of acid reflux pain. Genitourinary: Denies dysuria, urgency, frequency, hematuria, flank pain and difficulty urinating.  Musculoskeletal: Denies myalgias, back pain, joint swelling, arthralgias and gait problem.  Skin: Denies pallor, rash and wound.  Neurological: Has a history of dizziness, complaints of headache. Hematological: Denies adenopathy. Easy bruising, personal or family bleeding history  Psychiatric/Behavioral: Denies suicidal ideation, mood changes, confusion, nervousness, sleep  disturbance and agitation  Past Medical History  Diagnosis Date  . HYPERLIPIDEMIA 12/24/2006  . HYPERTENSION 12/24/2006  . ALLERGIC RHINITIS CAUSE UNSPECIFIED 05/27/2010  . GERD 12/24/2006  . DIVERTICULOSIS, COLON 03/09/2007  . Other constipation 11/06/2009  . PROSTATITIS, RECURRENT 12/30/2007  . OSTEOARTHRITIS 12/24/2006  . SHOULDER PAIN, BILATERAL 07/16/2007  . Cervicalgia 05/27/2010  . BACK PAIN, UPPER 02/21/2010  . DIZZINESS, CHRONIC 03/09/2007  . INSOMNIA WITH SLEEP APNEA UNSPECIFIED 05/27/2010  . HEADACHE, SINUS 04/06/2009  . CHEST PAIN UNSPECIFIED 04/25/2009  . Open wound of wrist, complicated 3/97/6734  . ADVEF, DRUG/MEDICINAL/BIOLOGICAL SUBST NOS 03/09/2007  . TRANSIENT ISCHEMIC ATTACKS, HX OF 04/06/2009  . PONV (postoperative nausea and vomiting)    Past Surgical History  Procedure Laterality Date  . Replacement total knee bilateral  2005/2007  . Ankle arthroscopy w/ open repair  2002    Repair  . Tonsillectomy    . Colonoscopy    . Ear cyst excision Bilateral 01/18/2013    Procedure: EXCISION CYSTS FROM BACK (TWO);  Surgeon: Earnstine Regal, MD;  Location: [facility];  Service: General;  Laterality: Bilateral;   Social History:  reports that he quit smoking about 38 years ago. He does not have any smokeless tobacco history on file. He reports that he does not drink alcohol or use illicit drugs. Lives at home with his wife.  Allergies  Allergen Reactions  . Codeine Other (See Comments)    REACTION: Nausea    Family History  Problem Relation Age of Onset  . Achalasia Father   . Depression Father   . Heart disease Brother 83  . Kidney failure Mother   . Diabetes Sister     Prior to Admission medications   Medication Sig Start Date  End Date Taking? Authorizing Provider  amitriptyline (ELAVIL) 25 MG tablet Take 25 mg by mouth 2 (two) times daily. Take 25 mg by mouth at 2200 (10PM) and take 25 mg by mouth at 0000 (12 AM)   Yes Historical Provider, MD    Lactobacillus (ACIDOPHILUS EXTRA STRENGTH) CAPS Take 1 capsule by mouth daily.    Yes Historical Provider, MD  meclizine (ANTIVERT) 12.5 MG tablet Take 25 mg by mouth every 6 (six) hours as needed for dizziness.   Yes Historical Provider, MD  Probiotic Product ([place]) CAPS Take 1 capsule by mouth daily.    Yes Historical Provider, MD  promethazine (PHENERGAN) 50 MG tablet Take 100 mg by mouth every 6 (six) hours as needed for nausea or vomiting.    Yes Historical Provider, MD  sodium chloride (OCEAN) 0.65 % SOLN nasal spray Place 2 sprays into both nostrils at bedtime.   Yes Historical Provider, MD  valACYclovir (VALTREX) 500 MG tablet Take 500 mg by mouth daily as needed (for sores).   Yes Historical Provider, MD   Physical Exam: Filed Vitals:   02/27/14 1544  BP: 158/76  Pulse:   Temp: 99.7 F (37.6 C)  Resp: 26     General: Well developed, well nourished, moderate distress, appears stated age, rigors  HEENT: NCAT, PERRLA, EOMI, Anicteic Sclera, mucous membranes dry  Neck: Supple, no JVD, no masses  Cardiovascular: S1 S2 auscultated, no rubs, murmurs or gallops. tachycardic  Respiratory: Clear to auscultation bilaterally with equal chest rise  Abdomen: Soft, nontender, nondistended, + bowel sounds  Extremities: warm dry without cyanosis clubbing or edema  Neuro: AAOx3, cranial nerves grossly intact. Strength 5/5 in patient's upper and lower extremities bilaterally  Skin: Without rashes exudates or nodules  Psych: Normal affect and demeanor with intact judgement and insight  Labs on Admission:  Basic Metabolic Panel:  Recent Labs Lab 02/27/14 1137  NA 126*  K 4.0  CL 87*  CO2 23  GLUCOSE 156*  BUN 14  CREATININE 1.21  CALCIUM 9.6   Liver Function Tests: No results found for this basename: AST, ALT, ALKPHOS, BILITOT, PROT, ALBUMIN,  in the last 168 hours No results found for this basename: LIPASE, AMYLASE,  in the last 168 hours No results  found for this basename: AMMONIA,  in the last 168 hours CBC:  Recent Labs Lab 02/27/14 1137  WBC 26.4*  NEUTROABS 23.0*  HGB 15.4  HCT 42.9  MCV 89.9  PLT 204   Cardiac Enzymes: No results found for this basename: CKTOTAL, CKMB, CKMBINDEX, TROPONINI,  in the last 168 hours  BNP (last 3 results) No results found for this basename: PROBNP,  in the last 8760 hours CBG: No results found for this basename: GLUCAP,  in the last 168 hours  Radiological Exams on Admission: Ct Head Wo Contrast  02/27/2014   CLINICAL DATA:  fever and sinus infection  EXAM: CT HEAD WITHOUT CONTRAST  TECHNIQUE: Contiguous axial images were obtained from the base of the skull through the vertex without intravenous contrast.  COMPARISON:  Brain MRI 04/13/2009  FINDINGS: No acute intracranial hemorrhage. No focal mass lesion. No CT evidence of acute infarction. No midline shift or mass effect. No hydrocephalus. Basilar cisterns are patent. Paranasal sinuses and mastoid air cells are clear.  IMPRESSION: No acute intracranial findings   Electronically Signed   By: Suzy Bouchard M.D.   On: 02/27/2014 15:41   Dg Chest Port 1 View  02/27/2014   CLINICAL DATA:  Fever for 5 days.  EXAM: PORTABLE CHEST - 1 VIEW  COMPARISON:  02/27/2014  FINDINGS: Left lower lobe retrocardiac airspace opacity. Indistinct interstitial accentuation in the right perihilar region.  Atherosclerotic aortic arch. Heart size within normal limits for technique.  IMPRESSION: 1. Left lower lobe airspace opacity, suspicious for pneumonia. Followup chest radiography is recommended in 4 weeks time to ensure resolution and exclude underlying malignancy. 2. Right perihilar opacity, possibly from mild aspiration pneumonitis orally bronchopneumonia on the right as well.   Electronically Signed   By: Sherryl Barters M.D.   On: 02/27/2014 12:35    EKG: None  Assessment/Plan  Fever of unknown origin -Patient admitted to stepdown. -Patient presented with  a fever of 103 -Possibly secondary to meningitis versus community-acquired pneumonia -Chest x-ray shows a left lower lobe air space opacity suspicious for pneumonia, however on exam lung sounds are clear -Will cover patient for community acquired pneumonia with azithromycin as well as ceftriaxone -Patient will also receive 2 g of ceftriaxone for questionable meningitis -ER physician attempting lumbar puncture, will followup on the labs of the CSF. -Blood cultures currently pending -UA shows few bacteria, 0-2 WBCs, negative leukocytes and nitrites -C. difficile PCR pending -Will provide tylenol for fever -Will obtain EKG.   Sepsis secondary to possible community-acquired pneumonia -Patient currently febrile with leukocytosis and possible pneumonia found on chest x-ray -Will cover patient with azithromycin and ceftriaxone -Will obtain urine Legionella and strep pneumonia antigen -Will attempt to obtain a sputum culture and Gram stain -Will obtain CT of the chest rule out PE -Will also obtain respiratory viral panel  Headache -Likely related to the above.  -Will continue to monitor and provide pain medications as needed.  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 Admission, patients condition and plan of care including tests being ordered have been discussed with the patient and wife, who indicate understanding and agree with the plan and Code  Status.  Disposition Plan: Admitted  Time spent: 60 minutes  Sunset Joshi D.O. [facility] Pager [phone]  If 7PM-7AM, please contact night-coverage [email or web address] Password [facility] 02/27/2014, 4:23 PM

## 2014-02-27 NOTE — ED Notes (Signed)
Placed Pt in [facility]. Pt on BP, Pulse Ox and  5Lead. Patient Given Flat Sheet.

## 2014-02-27 NOTE — ED Notes (Signed)
Attempted report 

## 2014-02-27 NOTE — ED Notes (Addendum)
Pt reports fever and sinus infection, trouble walking for about 1 week. Took a family members antibiotics this weekend. Pt is a x 4. Has had blood coming from nose several times. No blood thinner.

## 2014-02-27 NOTE — ED Provider Notes (Signed)
CSN: [id number]     Arrival date & time 02/27/14  1106 History   First MD Initiated Contact with Patient 02/27/14 1120     Chief Complaint  Patient presents with  . Fever     (Consider location/radiation/quality/duration/timing/severity/associated sxs/prior Treatment) HPI  Past Medical History  Diagnosis Date  . HYPERLIPIDEMIA 12/24/2006  . HYPERTENSION 12/24/2006  . ALLERGIC RHINITIS CAUSE UNSPECIFIED 05/27/2010  . GERD 12/24/2006  . DIVERTICULOSIS, COLON 03/09/2007  . Other constipation 11/06/2009  . PROSTATITIS, RECURRENT 12/30/2007  . OSTEOARTHRITIS 12/24/2006  . SHOULDER PAIN, BILATERAL 07/16/2007  . Cervicalgia 05/27/2010  . BACK PAIN, UPPER 02/21/2010  . DIZZINESS, CHRONIC 03/09/2007  . INSOMNIA WITH SLEEP APNEA UNSPECIFIED 05/27/2010  . HEADACHE, SINUS 04/06/2009  . CHEST PAIN UNSPECIFIED 04/25/2009  . Open wound of wrist, complicated 02/06/1750  . ADVEF, DRUG/MEDICINAL/BIOLOGICAL SUBST NOS 03/09/2007  . TRANSIENT ISCHEMIC ATTACKS, HX OF 04/06/2009  . PONV (postoperative nausea and vomiting)    Past Surgical History  Procedure Laterality Date  . Replacement total knee bilateral  2005/2007  . Ankle arthroscopy w/ open repair  2002    Repair  . Tonsillectomy    . Colonoscopy    . Ear cyst excision Bilateral 01/18/2013    Procedure: EXCISION CYSTS FROM BACK (TWO);  Surgeon: Earnstine Regal, MD;  Location: [facility];  Service: General;  Laterality: Bilateral;   Family History  Problem Relation Age of Onset  . Achalasia Father   . Depression Father   . Heart disease Brother 27  . Kidney failure Mother   . Diabetes Sister    History  Substance Use Topics  . Smoking status: Former Smoker    Quit date: 08/15/1975  . Smokeless tobacco: Not on file     Comment: quit 35 years ago  . Alcohol Use: No    Review of Systems    Allergies  Codeine  Home Medications   Prior to Admission medications   Medication Sig Start Date End Date Taking? Authorizing  Provider  amitriptyline (ELAVIL) 25 MG tablet Take 25 mg by mouth 2 (two) times daily. Take 25 mg by mouth at 2200 (10PM) and take 25 mg by mouth at 0000 (12 AM)   Yes Historical Provider, MD  Lactobacillus (ACIDOPHILUS EXTRA STRENGTH) CAPS Take 1 capsule by mouth daily.    Yes Historical Provider, MD  meclizine (ANTIVERT) 12.5 MG tablet Take 25 mg by mouth every 6 (six) hours as needed for dizziness.   Yes Historical Provider, MD  Probiotic Product ([place]) CAPS Take 1 capsule by mouth daily.    Yes Historical Provider, MD  promethazine (PHENERGAN) 50 MG tablet Take 100 mg by mouth every 6 (six) hours as needed for nausea or vomiting.    Yes Historical Provider, MD  sodium chloride (OCEAN) 0.65 % SOLN nasal spray Place 2 sprays into both nostrils at bedtime.   Yes Historical Provider, MD  valACYclovir (VALTREX) 500 MG tablet Take 500 mg by mouth daily as needed (for sores).   Yes Historical Provider, MD   BP 158/76  Pulse 89  Temp(Src) 99.7 F (37.6 C) (Oral)  Resp 26  Ht 5\' 8"  (1.727 m)  Wt 197 lb (89.359 kg)  BMI 29.96 kg/m2  SpO2 98% Physical Exam  ED Course  LUMBAR PUNCTURE Date/Time: 02/27/2014 4:51 PM Performed by: Kelby Aline Authorized by: Kelby Aline Consent: Verbal consent obtained. written consent obtained. Risks and benefits: risks, benefits and alternatives were discussed Consent given by: patient Patient understanding:  patient states understanding of the procedure being performed Patient consent: the patient's understanding of the procedure matches consent given Procedure consent: procedure consent matches procedure scheduled Relevant documents: relevant documents present and verified Test results: test results available and properly labeled Site marked: the operative site was marked Patient identity confirmed: verbally with patient Time out: Immediately prior to procedure a "time out" was called to verify the correct patient, procedure,  equipment, support staff and site/side marked as required. Indications: evaluation for infection Local anesthetic: lidocaine 1% without epinephrine Anesthetic total: 5 ml Patient sedated: no Preparation: Patient was prepped and draped in the usual sterile fashion. Lumbar space: L4-L5 interspace Patient's position: sitting Needle gauge: 22 Needle type: spinal needle - Quincke tip Needle length: 3.5 in Number of attempts: 3 Fluid appearance: blood-tinged then clearing Tubes of fluid: 4 Total volume: 6 ml Post-procedure: site cleaned and adhesive bandage applied Patient tolerance: Patient tolerated the procedure well with no immediate complications.   (including critical care time) Labs Review Labs Reviewed  CBC WITH DIFFERENTIAL - Abnormal; Notable for the following:    WBC 26.4 (*)    Neutrophils Relative % 87 (*)    Lymphocytes Relative 8 (*)    Neutro Abs 23.0 (*)    Monocytes Absolute 1.3 (*)    All other components within normal limits  BASIC METABOLIC PANEL - Abnormal; Notable for the following:    Sodium 126 (*)    Chloride 87 (*)    Glucose, Bld 156 (*)    GFR calc non Af Amer 61 (*)    GFR calc Af Amer 70 (*)    Anion gap 16 (*)    All other components within normal limits  URINALYSIS, ROUTINE W REFLEX MICROSCOPIC - Abnormal; Notable for the following:    Hgb urine dipstick MODERATE (*)    Ketones, ur 15 (*)    Protein, ur 100 (*)    All other components within normal limits  URINE MICROSCOPIC-ADD ON - Abnormal; Notable for the following:    Bacteria, UA FEW (*)    All other components within normal limits  SEDIMENTATION RATE - Abnormal; Notable for the following:    Sed Rate 68 (*)    All other components within normal limits  CULTURE, BLOOD (ROUTINE X 2)  CULTURE, BLOOD (ROUTINE X 2)  URINE CULTURE  STOOL CULTURE  CLOSTRIDIUM DIFFICILE BY PCR  CSF CULTURE  GRAM STAIN  HERPES SIMPLEX VIRUS CULTURE  C-REACTIVE PROTEIN  CSF CELL COUNT WITH DIFFERENTIAL   CSF CELL COUNT WITH DIFFERENTIAL  GLUCOSE, CSF  PROTEIN, CSF  HERPES SIMPLEX VIRUS(HSV) DNA BY PCR  CRYPTOCOCCAL ANTIGEN, CSF  VDRL, CSF  ROCKY MTN SPOTTED FVR AB, IGM-BLOOD  B. BURGDORFI ANTIBODIES, CSF  LYME DISEASE DNA BY PCR(BORRELIA BURG)  WEST NILE AB, IGG AND IGM, CSF  I-STAT CG4 LACTIC ACID, ED    Imaging Review Ct Head Wo Contrast  02/27/2014   CLINICAL DATA:  fever and sinus infection  EXAM: CT HEAD WITHOUT CONTRAST  TECHNIQUE: Contiguous axial images were obtained from the base of the skull through the vertex without intravenous contrast.  COMPARISON:  Brain MRI 04/13/2009  FINDINGS: No acute intracranial hemorrhage. No focal mass lesion. No CT evidence of acute infarction. No midline shift or mass effect. No hydrocephalus. Basilar cisterns are patent. Paranasal sinuses and mastoid air cells are clear.  IMPRESSION: No acute intracranial findings   Electronically Signed   By: Suzy Bouchard M.D.   On: 02/27/2014 15:41  Dg Chest Port 1 View  02/27/2014   CLINICAL DATA:  Fever for 5 days.  EXAM: PORTABLE CHEST - 1 VIEW  COMPARISON:  02/27/2014  FINDINGS: Left lower lobe retrocardiac airspace opacity. Indistinct interstitial accentuation in the right perihilar region.  Atherosclerotic aortic arch. Heart size within normal limits for technique.  IMPRESSION: 1. Left lower lobe airspace opacity, suspicious for pneumonia. Followup chest radiography is recommended in 4 weeks time to ensure resolution and exclude underlying malignancy. 2. Right perihilar opacity, possibly from mild aspiration pneumonitis orally bronchopneumonia on the right as well.   Electronically Signed   By: Sherryl Barters M.D.   On: 02/27/2014 12:35     EKG Interpretation None      MDM   Final diagnoses:  Hyponatremia  SIRS (systemic inflammatory response syndrome)    This is for procedure purposes only.     Kelby Aline, MD 02/27/14 [phone]

## 2014-02-28 DIAGNOSIS — A7740 Ehrlichiosis, unspecified: Secondary | ICD-10-CM

## 2014-02-28 DIAGNOSIS — J189 Pneumonia, unspecified organism: Secondary | ICD-10-CM

## 2014-02-28 DIAGNOSIS — A6920 Lyme disease, unspecified: Secondary | ICD-10-CM

## 2014-02-28 DIAGNOSIS — R509 Fever, unspecified: Secondary | ICD-10-CM

## 2014-02-28 LAB — URINE CULTURE
COLONY COUNT: NO GROWTH
CULTURE: NO GROWTH

## 2014-02-28 LAB — OSMOLALITY: Osmolality: 269 mOsm/kg — ABNORMAL LOW (ref 275–300)

## 2014-02-28 LAB — OSMOLALITY, URINE: Osmolality, Ur: 309 mOsm/kg — ABNORMAL LOW (ref 390–1090)

## 2014-02-28 LAB — HERPES SIMPLEX VIRUS(HSV) DNA BY PCR
HSV 1 DNA: NOT DETECTED
HSV 2 DNA: NOT DETECTED

## 2014-02-28 LAB — LEGIONELLA ANTIGEN, URINE: Legionella Antigen, Urine: NEGATIVE

## 2014-02-28 LAB — INFLUENZA PANEL BY PCR (TYPE A & B)
H1N1FLUPCR: NOT DETECTED
Influenza A By PCR: NEGATIVE
Influenza B By PCR: NEGATIVE

## 2014-02-28 LAB — CLOSTRIDIUM DIFFICILE BY PCR: CDIFFPCR: NEGATIVE

## 2014-02-28 MED ORDER — DEXTROSE 5 % IV SOLN
10.0000 mg/kg | Freq: Three times a day (TID) | INTRAVENOUS | Status: DC
  Administered 2014-03-01 (×2): 685 mg via INTRAVENOUS
  Filled 2014-02-28 (×5): qty 13.7

## 2014-02-28 MED ORDER — DEXTROSE 5 % IV SOLN
500.0000 mg | INTRAVENOUS | Status: DC
  Administered 2014-02-28 – 2014-03-02 (×3): 500 mg via INTRAVENOUS
  Filled 2014-02-28 (×4): qty 500

## 2014-02-28 MED ORDER — SIMETHICONE 80 MG PO CHEW
160.0000 mg | CHEWABLE_TABLET | Freq: Four times a day (QID) | ORAL | Status: DC | PRN
  Administered 2014-02-28: 160 mg via ORAL
  Filled 2014-02-28: qty 2

## 2014-02-28 MED ORDER — FAMOTIDINE 20 MG PO TABS
20.0000 mg | ORAL_TABLET | Freq: Two times a day (BID) | ORAL | Status: DC | PRN
  Administered 2014-02-28 – 2014-03-01 (×2): 20 mg via ORAL
  Filled 2014-02-28 (×2): qty 1

## 2014-02-28 NOTE — Progress Notes (Signed)
ANTIBIOTIC CONSULT NOTE - INITIAL  Pharmacy Consult for Acyclovir Indication: rule out meningitis  Allergies  Allergen Reactions  . Codeine Other (See Comments)    REACTION: Nausea    Patient Measurements: Height: 5\' 8"  (172.7 cm) Weight: 199 lb 1.2 oz (90.3 kg) IBW/kg (Calculated) : 68.4 Adjusted Body Weight: 77 kg  Vital Signs: Temp: 99.3 F (37.4 C) (09/01 1939) Temp src: Oral (09/01 1939) BP: 124/66 mmHg (09/01 1939) Pulse Rate: 86 (09/01 1939) Intake/Output from previous day: 08/31 0701 - 09/01 0700 In: 2464.6 [I.V.:2164.6; IV Piggyback:300] Out: 2175 [Urine:2175] Intake/Output from this shift: Total I/O In: -  Out: 200 [Urine:200]  Labs:  Recent Labs  02/27/14 1137 02/27/14 2159  WBC 26.4*  --   HGB 15.4  --   PLT 204  --   LABCREA  --  51.93  CREATININE 1.21  --    Estimated Creatinine Clearance: 64.7 ml/min (by C-G formula based on Cr of 1.21). No results found for this basename: VANCOTROUGH, VANCOPEAK, VANCORANDOM, [place], [place], GENTRANDOM, [place], TOBRAPEAK, TOBRARND, AMIKACINPEAK, AMIKACINTROU, AMIKACIN,  in the last 72 hours   Microbiology: Recent Results (from the past 720 hour(s))  CULTURE, BLOOD (ROUTINE X 2)     Status: None   Collection Time    02/27/14 12:10 PM      Result Value Ref Range Status   Specimen Description BLOOD RIGHT FOREARM   Final   Special Requests BOTTLES DRAWN AEROBIC AND ANAEROBIC 5CCS   Final   Culture  Setup Time     Final   Value: 02/27/2014 16:56     Performed at [facility]   Culture     Final   Value:        BLOOD CULTURE RECEIVED NO GROWTH TO DATE CULTURE WILL BE HELD FOR 5 DAYS BEFORE ISSUING A FINAL NEGATIVE REPORT     Performed at [facility]   Report Status PENDING   Incomplete  CULTURE, BLOOD (ROUTINE X 2)     Status: None   Collection Time    02/27/14 12:20 PM      Result Value Ref Range Status   Specimen Description BLOOD RIGHT ANTECUBITAL   Final   Special Requests  BOTTLES DRAWN AEROBIC AND ANAEROBIC 5CCS   Final   Culture  Setup Time     Final   Value: 02/27/2014 16:56     Performed at [facility]   Culture     Final   Value:        BLOOD CULTURE RECEIVED NO GROWTH TO DATE CULTURE WILL BE HELD FOR 5 DAYS BEFORE ISSUING A FINAL NEGATIVE REPORT     Performed at [facility]   Report Status PENDING   Incomplete  URINE CULTURE     Status: None   Collection Time    02/27/14  2:16 PM      Result Value Ref Range Status   Specimen Description URINE, CLEAN CATCH   Final   Special Requests NONE   Final   Culture  Setup Time     Final   Value: 02/27/2014 17:05     Performed at [facility]     Final   Value: NO GROWTH     Performed at [facility]   Culture     Final   Value: NO GROWTH     Performed at [facility]   Report Status 02/28/2014 FINAL   Final  CSF CULTURE  Status: None   Collection Time    02/27/14  4:35 PM      Result Value Ref Range Status   Specimen Description CSF   Final   Special Requests 1.0ML CSF FLUID   Final   Gram Stain     Final   Value: WBC PRESENT, PREDOMINANTLY MONONUCLEAR     NO ORGANISMS SEEN     CYTOSPIN Performed at [facility]     Performed at [facility]   Culture     Final   Value: NO GROWTH     Performed at [facility]   Report Status PENDING   Incomplete  GRAM STAIN     Status: None   Collection Time    02/27/14  4:35 PM      Result Value Ref Range Status   Specimen Description CSF   Final   Special Requests 1.0ML CSF FLUID   Final   Gram Stain     Final   Value: CYTOSPIN PREP     WBC PRESENT, PREDOMINANTLY MONONUCLEAR     NO ORGANISMS SEEN   Report Status 02/27/2014 FINAL   Final  MRSA PCR SCREENING     Status: None   Collection Time    02/27/14  7:12 PM      Result Value Ref Range Status   MRSA by PCR NEGATIVE  NEGATIVE Final   Comment:            The GeneXpert MRSA Assay (FDA     approved for NASAL specimens      only), is one component of a     comprehensive MRSA colonization     surveillance program. It is not     intended to diagnose MRSA     infection nor to guide or     monitor treatment for     MRSA infections.  CLOSTRIDIUM DIFFICILE BY PCR     Status: None   Collection Time    02/27/14 11:22 PM      Result Value Ref Range Status   C difficile by pcr NEGATIVE  NEGATIVE Final    Medical History: Past Medical History  Diagnosis Date  . HYPERLIPIDEMIA 12/24/2006  . HYPERTENSION 12/24/2006  . ALLERGIC RHINITIS CAUSE UNSPECIFIED 05/27/2010  . GERD 12/24/2006  . DIVERTICULOSIS, COLON 03/09/2007  . Other constipation 11/06/2009  . PROSTATITIS, RECURRENT 12/30/2007  . OSTEOARTHRITIS 12/24/2006  . SHOULDER PAIN, BILATERAL 07/16/2007  . Cervicalgia 05/27/2010  . BACK PAIN, UPPER 02/21/2010  . DIZZINESS, CHRONIC 03/09/2007  . INSOMNIA WITH SLEEP APNEA UNSPECIFIED 05/27/2010  . HEADACHE, SINUS 04/06/2009  . CHEST PAIN UNSPECIFIED 04/25/2009  . Open wound of wrist, complicated 5/80/9983  . ADVEF, DRUG/MEDICINAL/BIOLOGICAL SUBST NOS 03/09/2007  . TRANSIENT ISCHEMIC ATTACKS, HX OF 04/06/2009  . PONV (postoperative nausea and vomiting)     Medications:  Prescriptions prior to admission  Medication Sig Dispense Refill  . amitriptyline (ELAVIL) 25 MG tablet Take 25 mg by mouth 2 (two) times daily. Take 25 mg by mouth at 2200 (10PM) and take 25 mg by mouth at 0000 (12 AM)      . Lactobacillus (ACIDOPHILUS EXTRA STRENGTH) CAPS Take 1 capsule by mouth daily.       . meclizine (ANTIVERT) 12.5 MG tablet Take 25 mg by mouth every 6 (six) hours as needed for dizziness.      . Probiotic Product ([place]) CAPS Take 1 capsule by mouth daily.       . promethazine (  PHENERGAN) 50 MG tablet Take 100 mg by mouth every 6 (six) hours as needed for nausea or vomiting.       . sodium chloride (OCEAN) 0.65 % SOLN nasal spray Place 2 sprays into both nostrils at bedtime.      . valACYclovir (VALTREX) 500 MG  tablet Take 500 mg by mouth daily as needed (for sores).       Assessment: 68yo male with fever of unknown origin to start acyclovir for possible meningitis. Pt has been febrile to 103.9 with WBC 26.4 and sCr 1.21.  Goal of Therapy:  Resolution of infection  Plan:  Start acyclovir 685 mg q8h Follow up culture results, renal function  Andrey Cota. Diona Foley, PharmD Clinical Pharmacist Pager [phone] 02/28/2014,10:12 PM

## 2014-02-28 NOTE — Progress Notes (Signed)
Utilization review completed.  

## 2014-02-28 NOTE — Progress Notes (Signed)
[name] TEAM 1 - Stepdown/ICU TEAM Progress Note  [name]:[id number] DOB: September 13, 1945 DOA: 02/27/2014 PCP: Georgetta Haber, MD  Admit HPI / Brief Narrative: [name] is a 68 y.o. male  With a history of sinusitis, dizziness, who presented to the emergency department with complaints of fever, chills, and headache for 5 days. Patient began having fever and chills approximately one week ago and presented to urgent care today at which point he was noted to be tachycardic and febrile. Patient was then sent to the emergency department for further workup. 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 He does admit to being at the [facility] several times, as they do have acute. Patient does admit swimming in [place] admitting bit by mosquitoes. Patient denies any sick contacts, recent sickness or illness, chest pain, abdominal pain. Patient does admit to having some loose stools. He also has had a cough but denies any shortness of breath.   HPI/Subjective: 9/1A/O. x4, states his resting tremors are significantly worse since he became ill, positive headache, negative N./V. positive diarrhea.  Assessment/Plan:  Fever of unknown origin (Lyme disease//ehrlichiosis?) -Patient admitted to stepdown.  -Patient presented with a fever of 103  -Possibly secondary to meningitis versus community-acquired pneumonia  -Chest x-ray shows a left lower lobe air space opacity suspicious for pneumonia, however on exam lung sounds are clear  -Will cover patient for community acquired pneumonia with azithromycin as well as ceftriaxone  -Patient also received 2 gm of ceftriaxone for questionable meningitis  -Follow lumbar puncture, will followup on the labs of the CSF.  -Blood cultures currently pending  -UA shows few bacteria, 0-2 WBCs, negative leukocytes and nitrites  -C. difficile PCR pending  -Continue doxycycline for empiric coverage for Lyme  disease/ehrlichiosis patient lives in a [place] with his wife.   Sepsis secondary to possible community-acquired pneumonia  -Patient currently febrile with leukocytosis and possible pneumonia found on chest x-ray  -Continue azithromycin and ceftriaxone  -Legionella and strep pneumonia antigen pending -obtain a sputum culture and Gram stain  -CT of the chest rule out PE; negative  -Respiratory viral panel pending  Headache  -Likely related to the above.  -Will continue to monitor and provide pain medications as needed.   History of sinusitis  -Will continue nasal spray  -CT of the head: No acute intracranial findings   Dizziness  -Will continue meclizine as needed, currently no complaints of dizziness.   Hyponatremia and hypochloremia  -likely secondary to dehydration  -Continue normal saline at 75 ml/hr   -Continue to monitor closely   History of ocular herpes  -Continue acyclovir per pharmacy; meningitis? Will neuro antibiotics after cultures return    GERD  -Will order protonix   Code Status: FULL Family Communication: no family present at time of exam Disposition Plan: Meningitis?    Consultants: NA  Procedure/Significant Events: 8/31 PCXR;1. Left lower lobe airspace opacity, suspicious for pneumonia.  -Right perihilar opacity,mild aspiration pneumonitis?; orally bronchopneumonia on the right as well. 8/31 CT chest with contrast; Multifocal bilateral infiltrate consistent with pneumonia. Mediastinal and right hilar adenopathy presumably reactive.  -5 mm left upper lobe pulmonary nodule.  8/31 CT head without contrast; no acute findings   Culture 8/31 blood right forearm/antecubital NGTD 8/31 urine pending 8/31 CSF pending (note no organisms seen on Gram stain) 8/31 herpes pending 8/31 MRSA by PCR negative  8/31 stool pending  8/31 C. difficile by  PCR negative    Antibiotics: Acyclovir 8/31>> Ceftriaxone 9/1>> Doxycycline 8/31>> Vancomycin  8/31>>  DVT prophylaxis: Lovenox   Devices    LINES / TUBES:      Continuous Infusions: . sodium chloride Stopped (02/28/14 0650)  . sodium chloride 75 mL/hr at 02/28/14 0650    Objective: VITAL SIGNS: Temp: 99 F (37.2 C) (09/01 0812) Temp src: Oral (09/01 0812) BP: 119/58 mmHg (09/01 0812) Pulse Rate: 84 (09/01 0812) SPO2; 94% on 2 L O2 via [place] FIO2:   Intake/Output Summary (Last 24 hours) at 02/28/14 0836 Last data filed at 02/28/14 4431  Gross per 24 hour  Intake 2464.59 ml  Output   2175 ml  Net 289.59 ml     Exam: General: A./O. x4, NAD, No acute respiratory distress Lungs: Clear to auscultation bilaterally without wheezes or crackles Cardiovascular: Regular rate and rhythm without murmur gallop or rub normal S1 and S2 Abdomen: Nontender, nondistended, soft, bowel sounds positive, no rebound, no ascites, no appreciable mass Extremities: No significant cyanosis, clubbing, or edema bilateral lower extremities Neurologic; cranial nerves II through XII intact, tongue/uvula midline, extremity strength 5/5, sensation intact, patient with resting tremors (chronic) states worse than usual, negative nuchal rigidity, negative Kernig sign   Data Reviewed: Basic Metabolic Panel:  Recent Labs Lab 02/27/14 1137 02/27/14 1934  NA 126*  --   K 4.0  --   CL 87*  --   CO2 23  --   GLUCOSE 156*  --   BUN 14  --   CREATININE 1.21  --   CALCIUM 9.6  --   MG  --  1.6  PHOS  --  1.6*   Liver Function Tests: No results found for this basename: AST, ALT, ALKPHOS, BILITOT, PROT, ALBUMIN,  in the last 168 hours No results found for this basename: LIPASE, AMYLASE,  in the last 168 hours No results found for this basename: AMMONIA,  in the last 168 hours CBC:  Recent Labs Lab 02/27/14 1137  WBC 26.4*  NEUTROABS 23.0*  HGB 15.4  HCT 42.9  MCV 89.9  PLT 204   Cardiac Enzymes: No results found for this basename: CKTOTAL, CKMB, CKMBINDEX, TROPONINI,  in the last  168 hours BNP (last 3 results) No results found for this basename: PROBNP,  in the last 8760 hours CBG: No results found for this basename: GLUCAP,  in the last 168 hours  Recent Results (from the past 240 hour(s))  CULTURE, BLOOD (ROUTINE X 2)     Status: None   Collection Time    02/27/14 12:10 PM      Result Value Ref Range Status   Specimen Description BLOOD RIGHT FOREARM   Final   Special Requests BOTTLES DRAWN AEROBIC AND ANAEROBIC 5CCS   Final   Culture  Setup Time     Final   Value: 02/27/2014 16:56     Performed at [facility]   Culture     Final   Value:        BLOOD CULTURE RECEIVED NO GROWTH TO DATE CULTURE WILL BE HELD FOR 5 DAYS BEFORE ISSUING A FINAL NEGATIVE REPORT     Performed at [facility]   Report Status PENDING   Incomplete  CULTURE, BLOOD (ROUTINE X 2)     Status: None   Collection Time    02/27/14 12:20 PM      Result Value Ref Range Status   Specimen Description BLOOD RIGHT ANTECUBITAL   Final  Special Requests BOTTLES DRAWN AEROBIC AND ANAEROBIC 5CCS   Final   Culture  Setup Time     Final   Value: 02/27/2014 16:56     Performed at [facility]   Culture     Final   Value:        BLOOD CULTURE RECEIVED NO GROWTH TO DATE CULTURE WILL BE HELD FOR 5 DAYS BEFORE ISSUING A FINAL NEGATIVE REPORT     Performed at [facility]   Report Status PENDING   Incomplete  CSF CULTURE     Status: None   Collection Time    02/27/14  4:35 PM      Result Value Ref Range Status   Specimen Description CSF   Final   Special Requests 1.0ML CSF FLUID   Final   Gram Stain     Final   Value: WBC PRESENT, PREDOMINANTLY MONONUCLEAR     NO ORGANISMS SEEN     CYTOSPIN Performed at [facility]     Performed at [facility]   Culture PENDING   Incomplete   Report Status PENDING   Incomplete  GRAM STAIN     Status: None   Collection Time    02/27/14  4:35 PM      Result Value Ref Range Status   Specimen Description CSF    Final   Special Requests 1.0ML CSF FLUID   Final   Gram Stain     Final   Value: CYTOSPIN PREP     WBC PRESENT, PREDOMINANTLY MONONUCLEAR     NO ORGANISMS SEEN   Report Status 02/27/2014 FINAL   Final  MRSA PCR SCREENING     Status: None   Collection Time    02/27/14  7:12 PM      Result Value Ref Range Status   MRSA by PCR NEGATIVE  NEGATIVE Final   Comment:            The GeneXpert MRSA Assay (FDA     approved for NASAL specimens     only), is one component of a     comprehensive MRSA colonization     surveillance program. It is not     intended to diagnose MRSA     infection nor to guide or     monitor treatment for     MRSA infections.     Studies:  Recent x-ray studies have been reviewed in detail by the Attending Physician  Scheduled Meds:  Scheduled Meds: . acidophilus  2 capsule Oral Daily  . amitriptyline  50 mg Oral QHS  . cefTRIAXone (ROCEPHIN)  IV  2 g Intravenous Q12H  . doxycycline (VIBRAMYCIN) IV  100 mg Intravenous Q12H  . enoxaparin (LOVENOX) injection  40 mg Subcutaneous Q24H  . sodium chloride  2 spray Each Nare QHS    Time spent on care of this patient: 40 mins   Allie Bossier , MD   [facility] Office  [phone] Pager - [phone]  On-Call/Text Page:      [email or web address]      password [email or web address]  If 7PM-7AM, please contact night-coverage [email or web address] Password [email or web address] 02/28/2014, 8:36 AM   LOS: 1 day

## 2014-03-01 LAB — COMPREHENSIVE METABOLIC PANEL
ALT: 35 U/L (ref 0–53)
AST: 36 U/L (ref 0–37)
Albumin: 2.2 g/dL — ABNORMAL LOW (ref 3.5–5.2)
Alkaline Phosphatase: 91 U/L (ref 39–117)
Anion gap: 10 (ref 5–15)
BILIRUBIN TOTAL: 0.3 mg/dL (ref 0.3–1.2)
BUN: 10 mg/dL (ref 6–23)
CHLORIDE: 96 meq/L (ref 96–112)
CO2: 24 meq/L (ref 19–32)
Calcium: 8.5 mg/dL (ref 8.4–10.5)
Creatinine, Ser: 1.06 mg/dL (ref 0.50–1.35)
GFR calc Af Amer: 82 mL/min — ABNORMAL LOW (ref >90)
GFR calc non Af Amer: 71 mL/min — ABNORMAL LOW (ref >90)
Glucose, Bld: 116 mg/dL — ABNORMAL HIGH (ref 70–99)
Potassium: 4 mEq/L (ref 3.7–5.3)
Sodium: 130 mEq/L — ABNORMAL LOW (ref 137–147)
Total Protein: 5.9 g/dL — ABNORMAL LOW (ref 6.0–8.3)

## 2014-03-01 LAB — CBC WITH DIFFERENTIAL/PLATELET
BASOS ABS: 0 10*3/uL (ref 0.0–0.1)
Basophils Relative: 0 % (ref 0–1)
Eosinophils Absolute: 0.1 10*3/uL (ref 0.0–0.7)
Eosinophils Relative: 1 % (ref 0–5)
HCT: 37.6 % — ABNORMAL LOW (ref 39.0–52.0)
Hemoglobin: 13 g/dL (ref 13.0–17.0)
Lymphocytes Relative: 6 % — ABNORMAL LOW (ref 12–46)
Lymphs Abs: 1.1 10*3/uL (ref 0.7–4.0)
MCH: 31 pg (ref 26.0–34.0)
MCHC: 34.6 g/dL (ref 30.0–36.0)
MCV: 89.7 fL (ref 78.0–100.0)
Monocytes Absolute: 1.9 10*3/uL — ABNORMAL HIGH (ref 0.1–1.0)
Monocytes Relative: 11 % (ref 3–12)
Neutro Abs: 13.7 10*3/uL — ABNORMAL HIGH (ref 1.7–7.7)
Neutrophils Relative %: 82 % — ABNORMAL HIGH (ref 43–77)
PLATELETS: 175 10*3/uL (ref 150–400)
RBC: 4.19 MIL/uL — ABNORMAL LOW (ref 4.22–5.81)
RDW: 13.8 % (ref 11.5–15.5)
WBC: 16.7 10*3/uL — ABNORMAL HIGH (ref 4.0–10.5)

## 2014-03-01 LAB — RESPIRATORY VIRUS PANEL
Adenovirus: NOT DETECTED
INFLUENZA A H1: NOT DETECTED
INFLUENZA B 1: NOT DETECTED
Influenza A H3: NOT DETECTED
Influenza A: NOT DETECTED
METAPNEUMOVIRUS: NOT DETECTED
PARAINFLUENZA 3 A: NOT DETECTED
Parainfluenza 1: NOT DETECTED
Parainfluenza 2: NOT DETECTED
RHINOVIRUS: NOT DETECTED
Respiratory Syncytial Virus A: NOT DETECTED
Respiratory Syncytial Virus B: NOT DETECTED

## 2014-03-01 LAB — MAGNESIUM: MAGNESIUM: 1.9 mg/dL (ref 1.5–2.5)

## 2014-03-01 LAB — HIV ANTIBODY (ROUTINE TESTING W REFLEX): HIV: NONREACTIVE

## 2014-03-01 LAB — ROCKY MTN SPOTTED FVR AB, IGM-BLOOD: RMSF IgM: 0.23 IV (ref 0.00–0.89)

## 2014-03-01 MED ORDER — DEXTROSE 5 % IV SOLN
2.0000 g | INTRAVENOUS | Status: DC
  Administered 2014-03-02 – 2014-03-05 (×4): 2 g via INTRAVENOUS
  Filled 2014-03-01 (×5): qty 2

## 2014-03-01 MED ORDER — SODIUM CHLORIDE 0.9 % IV SOLN
1000.0000 mL | INTRAVENOUS | Status: DC
  Administered 2014-03-01 – 2014-03-02 (×4): 1000 mL via INTRAVENOUS

## 2014-03-01 NOTE — Progress Notes (Signed)
[place] TEAM 1 - Stepdown/ICU TEAM Progress Note  [name] MVH:[id number] DOB: 10-28-1945 DOA: 02/27/2014 PCP: Georgetta Haber, MD  Admit HPI / Brief Narrative: 68 y.o. male who presented to the emergency department with complaints of fever, chills, and headache for 5 days. The patient began having fever and chills one week prior and presented to urgent care where he was noted to be tachycardic and febrile. Patient was then sent to the emergency department for further workup.Marland Kitchen  HPI/Subjective: Pt states he is beginning to feel much better in general.  He denies current HA, n/v, abdom pain.  He notes generalized body aches.    Assessment/Plan:  Multifocal CAP (confirmed on CT scan) - previously Fever (103 max) of unknown origin   -meningitis versus community-acquired pneumonia - Chest x-ray shows a left lower lobe air space opacity   -cover for community acquired pneumonia - urine for legionella and strep pneumo negative  -influenza screen negative  -Blood cultures currently pending - stool culture unrevealing  -UA shows few bacteria, 0-2 WBCs, negative leukocytes and nitrites  -C. difficile negative -Continue doxycycline for empiric coverage for Lyme disease/ehrlichiosis patient lives in a Relates with his wife -CSF HSV negative - culture no growth - gram stain w/ mononuclear WBCs and no organisms - cell count essentially normal - glucose high and protein normal, therefore not convincing of bacterial or even viral meningitis  -RMSF titer (acute) negative, but this does NOT rule out RMSF -narrow abx to cover CAP and follow clinically    Sepsis secondary to community-acquired pneumonia  -sepsis physiology much improved - cont empiric abx tx and volume resuscitation   Headache  -resolved - no meningeal sx   5 mm left upper lobe pulmonary nodule -f/u CT of chest will be needed in 6 months - pt is not a smoker - discussed at length w/ pt   History of sinusitis  -continue  nasal spray  -CT of the head: No acute intracranial findings   Dizziness  -Will continue meclizine as needed, currently no complaints of dizziness  Hyponatremia and hypochloremia  -due to hypovolemia - improving w/ volume resuscitation - follow   History of ocular herpes  -CSF HSV negative - no evidence of active HSV infection - stop acyclovir and folllow    GERD  -protonix  Code Status: FULL Family Communication: no family present at time of exam Disposition Plan: SDU  Consultants: NA  Significant Events: 8/31 CT chest with contrast; Multifocal bilateral infiltrate consistent with pneumonia. Mediastinal and right hilar adenopathy presumably reactive. -5 mm left upper lobe pulmonary nodule.  8/31 CT head without contrast; no acute findings  Antibiotics: Acyclovir 8/31 >> 9/2 Ceftriaxone 9/1>> Doxycycline 8/31>> 9/2 Vancomycin 8/31 Azithro 9/01 >>  DVT prophylaxis: Lovenox  Objective: Blood pressure 106/53, pulse 72, temperature 99.4 F (37.4 C), temperature source Oral, resp. rate 23, height 5\' 8"  (1.727 m), weight 90.3 kg (199 lb 1.2 oz), SpO2 94.00%.  Intake/Output Summary (Last 24 hours) at 03/01/14 1340 Last data filed at 03/01/14 0850  Gross per 24 hour  Intake 4096.25 ml  Output    975 ml  Net 3121.25 ml   Exam: General: No acute respiratory distress Lungs: bibasilar crackles - no wheeze  Cardiovascular: Regular rate and rhythm without murmur gallop or rub normal S1 and S2 Abdomen: Nontender, nondistended, soft, bowel sounds positive, no rebound, no ascites, no appreciable mass Extremities: No significant cyanosis, clubbing, or edema bilateral lower extremities Neurologic:  cranial nerves II through XII  intact, tongue/uvula midline  Data Reviewed: Basic Metabolic Panel:  Recent Labs Lab 02/27/14 1137 02/27/14 1934 03/01/14 0254  NA 126*  --  130*  K 4.0  --  4.0  CL 87*  --  96  CO2 23  --  24  GLUCOSE 156*  --  116*  BUN 14  --  10    CREATININE 1.21  --  1.06  CALCIUM 9.6  --  8.5  MG  --  1.6 1.9  PHOS  --  1.6*  --    Liver Function Tests:  Recent Labs Lab 03/01/14 0254  AST 36  ALT 35  ALKPHOS 91  BILITOT 0.3  PROT 5.9*  ALBUMIN 2.2*   CBC:  Recent Labs Lab 02/27/14 1137 03/01/14 0254  WBC 26.4* 16.7*  NEUTROABS 23.0* 13.7*  HGB 15.4 13.0  HCT 42.9 37.6*  MCV 89.9 89.7  PLT 204 175    Recent Results (from the past 240 hour(s))  CULTURE, BLOOD (ROUTINE X 2)     Status: None   Collection Time    02/27/14 12:10 PM      Result Value Ref Range Status   Specimen Description BLOOD RIGHT FOREARM   Final   Special Requests BOTTLES DRAWN AEROBIC AND ANAEROBIC 5CCS   Final   Culture  Setup Time     Final   Value: 02/27/2014 16:56     Performed at [facility]   Culture     Final   Value:        BLOOD CULTURE RECEIVED NO GROWTH TO DATE CULTURE WILL BE HELD FOR 5 DAYS BEFORE ISSUING A FINAL NEGATIVE REPORT     Performed at [facility]   Report Status PENDING   Incomplete  CULTURE, BLOOD (ROUTINE X 2)     Status: None   Collection Time    02/27/14 12:20 PM      Result Value Ref Range Status   Specimen Description BLOOD RIGHT ANTECUBITAL   Final   Special Requests BOTTLES DRAWN AEROBIC AND ANAEROBIC 5CCS   Final   Culture  Setup Time     Final   Value: 02/27/2014 16:56     Performed at [facility]   Culture     Final   Value:        BLOOD CULTURE RECEIVED NO GROWTH TO DATE CULTURE WILL BE HELD FOR 5 DAYS BEFORE ISSUING A FINAL NEGATIVE REPORT     Performed at [facility]   Report Status PENDING   Incomplete  URINE CULTURE     Status: None   Collection Time    02/27/14  2:16 PM      Result Value Ref Range Status   Specimen Description URINE, CLEAN CATCH   Final   Special Requests NONE   Final   Culture  Setup Time     Final   Value: 02/27/2014 17:05     Performed at [facility]     Final   Value: NO GROWTH     Performed at [facility]   Culture     Final   Value: NO GROWTH     Performed at [facility]   Report Status 02/28/2014 FINAL   Final  CSF CULTURE     Status: None   Collection Time    02/27/14  4:35 PM      Result Value Ref Range Status   Specimen Description CSF  Final   Special Requests 1.0ML CSF FLUID   Final   Gram Stain     Final   Value: WBC PRESENT, PREDOMINANTLY MONONUCLEAR     NO ORGANISMS SEEN     CYTOSPIN Performed at [facility]     Performed at [facility]   Culture     Final   Value: NO GROWTH 1 DAY     Performed at [facility]   Report Status PENDING   Incomplete  GRAM STAIN     Status: None   Collection Time    02/27/14  4:35 PM      Result Value Ref Range Status   Specimen Description CSF   Final   Special Requests 1.0ML CSF FLUID   Final   Gram Stain     Final   Value: CYTOSPIN PREP     WBC PRESENT, PREDOMINANTLY MONONUCLEAR     NO ORGANISMS SEEN   Report Status 02/27/2014 FINAL   Final  MRSA PCR SCREENING     Status: None   Collection Time    02/27/14  7:12 PM      Result Value Ref Range Status   MRSA by PCR NEGATIVE  NEGATIVE Final   Comment:            The GeneXpert MRSA Assay (FDA     approved for NASAL specimens     only), is one component of a     comprehensive MRSA colonization     surveillance program. It is not     intended to diagnose MRSA     infection nor to guide or     monitor treatment for     MRSA infections.  STOOL CULTURE     Status: None   Collection Time    02/27/14 11:22 PM      Result Value Ref Range Status   Specimen Description STOOL   Final   Special Requests Normal   Final   Culture     Final   Value: NO SUSPICIOUS COLONIES, CONTINUING TO HOLD     Performed at [facility]   Report Status PENDING   Incomplete  CLOSTRIDIUM DIFFICILE BY PCR     Status: None   Collection Time    02/27/14 11:22 PM      Result Value Ref Range Status   C difficile by pcr NEGATIVE  NEGATIVE Final      Studies:  Recent x-ray studies have been reviewed in detail by the Attending Physician  Scheduled Meds:  Scheduled Meds: . acidophilus  2 capsule Oral Daily  . acyclovir  10 mg/kg (Ideal) Intravenous 3 times per day  . amitriptyline  50 mg Oral QHS  . azithromycin  500 mg Intravenous Q24H  . cefTRIAXone (ROCEPHIN)  IV  2 g Intravenous Q12H  . doxycycline (VIBRAMYCIN) IV  100 mg Intravenous Q12H  . enoxaparin (LOVENOX) injection  40 mg Subcutaneous Q24H  . sodium chloride  2 spray Each Nare QHS    Time spent on care of this patient: 35 mins  Cherene Altes, MD [facility] For Consults/Admissions - Flow Manager - [phone] Office  [phone] Pager [phone]  On-Call/Text Page:      [email or web address]      password [facility]  03/01/2014, 1:40 PM   LOS: 2 days

## 2014-03-02 DIAGNOSIS — E870 Hyperosmolality and hypernatremia: Secondary | ICD-10-CM

## 2014-03-02 DIAGNOSIS — Z8709 Personal history of other diseases of the respiratory system: Secondary | ICD-10-CM

## 2014-03-02 LAB — COMPREHENSIVE METABOLIC PANEL
ALT: 60 U/L — ABNORMAL HIGH (ref 0–53)
AST: 87 U/L — ABNORMAL HIGH (ref 0–37)
Albumin: 2.1 g/dL — ABNORMAL LOW (ref 3.5–5.2)
Alkaline Phosphatase: 96 U/L (ref 39–117)
Anion gap: 13 (ref 5–15)
BUN: 11 mg/dL (ref 6–23)
CALCIUM: 8.4 mg/dL (ref 8.4–10.5)
CO2: 24 mEq/L (ref 19–32)
CREATININE: 0.97 mg/dL (ref 0.50–1.35)
Chloride: 100 mEq/L (ref 96–112)
GFR calc non Af Amer: 84 mL/min — ABNORMAL LOW (ref >90)
GLUCOSE: 107 mg/dL — AB (ref 70–99)
Potassium: 3.7 mEq/L (ref 3.7–5.3)
Sodium: 137 mEq/L (ref 137–147)
Total Bilirubin: 0.3 mg/dL (ref 0.3–1.2)
Total Protein: 5.8 g/dL — ABNORMAL LOW (ref 6.0–8.3)

## 2014-03-02 LAB — MISCELLANEOUS TEST

## 2014-03-02 LAB — CBC
HCT: 36.1 % — ABNORMAL LOW (ref 39.0–52.0)
Hemoglobin: 12.7 g/dL — ABNORMAL LOW (ref 13.0–17.0)
MCH: 31 pg (ref 26.0–34.0)
MCHC: 35.2 g/dL (ref 30.0–36.0)
MCV: 88 fL (ref 78.0–100.0)
PLATELETS: 204 10*3/uL (ref 150–400)
RBC: 4.1 MIL/uL — ABNORMAL LOW (ref 4.22–5.81)
RDW: 13.7 % (ref 11.5–15.5)
WBC: 13.8 10*3/uL — AB (ref 4.0–10.5)

## 2014-03-02 LAB — PHOSPHORUS: PHOSPHORUS: 3.2 mg/dL (ref 2.3–4.6)

## 2014-03-02 MED ORDER — FAMOTIDINE 40 MG PO TABS
40.0000 mg | ORAL_TABLET | Freq: Two times a day (BID) | ORAL | Status: DC | PRN
  Administered 2014-03-02 – 2014-03-04 (×3): 40 mg via ORAL
  Filled 2014-03-02 (×3): qty 1

## 2014-03-02 MED ORDER — METHYLPREDNISOLONE SODIUM SUCC 125 MG IJ SOLR
60.0000 mg | INTRAMUSCULAR | Status: DC
  Administered 2014-03-02 – 2014-03-03 (×2): 60 mg via INTRAVENOUS
  Filled 2014-03-02 (×2): qty 0.96

## 2014-03-02 MED ORDER — DM-GUAIFENESIN ER 30-600 MG PO TB12
1.0000 | ORAL_TABLET | Freq: Two times a day (BID) | ORAL | Status: DC
  Administered 2014-03-02 – 2014-03-05 (×6): 1 via ORAL
  Filled 2014-03-02 (×7): qty 1

## 2014-03-02 MED ORDER — DOXYCYCLINE HYCLATE 100 MG IV SOLR
100.0000 mg | Freq: Two times a day (BID) | INTRAVENOUS | Status: DC
  Administered 2014-03-02 – 2014-03-03 (×3): 100 mg via INTRAVENOUS
  Filled 2014-03-02 (×4): qty 100

## 2014-03-02 MED ORDER — CETYLPYRIDINIUM CHLORIDE 0.05 % MT LIQD
7.0000 mL | Freq: Two times a day (BID) | OROMUCOSAL | Status: DC
  Administered 2014-03-02 – 2014-03-06 (×6): 7 mL via OROMUCOSAL

## 2014-03-02 MED ORDER — IPRATROPIUM-ALBUTEROL 0.5-2.5 (3) MG/3ML IN SOLN
3.0000 mL | Freq: Four times a day (QID) | RESPIRATORY_TRACT | Status: DC
  Administered 2014-03-02 – 2014-03-04 (×9): 3 mL via RESPIRATORY_TRACT
  Filled 2014-03-02 (×9): qty 3

## 2014-03-02 NOTE — Progress Notes (Signed)
[place] TEAM 1 - Stepdown/ICU TEAM Progress Note  [name] VXB:[id number] DOB: 1946-05-20 DOA: 02/27/2014 PCP: Charles Haber, MD  Admit HPI / Brief Narrative: [name] is a 68 y.o. WM PMHx  Hx TIA, recurrent prostatitis, HTN, HLD, diverticulosis, rectal bleed, Hx sinusitis, dizziness, who presented to the emergency department with complaints of fever, chills, and headache for 5 days. Patient began having fever and chills approximately one week ago and presented to urgent care today at which point he was noted to be tachycardic and febrile. Patient was then sent to the emergency department for further workup. 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 He does admit to being at the [place] several times, as they do have acute. Patient does admit swimming in [place] admitting bit by mosquitoes. Patient denies any sick contacts, recent sickness or illness, chest pain, abdominal pain. Patient does admit to having some loose stools. He also has had a cough but denies any shortness of breath. 9/1 A/O. x4, states his resting tremors are significantly worse since he became ill, positive headache, negative N./V. positive diarrhea.  HPI/Subjective: 9/3 A/O. x4, feels improved, positive fatigue, has not been up moving around so unsure if he has dizziness, DOE   Assessment/Plan: Multifocal CAP (confirmed on CT scan) - previously Fever (103 max) of unknown origin  -meningitis versus community-acquired pneumonia - Chest x-ray shows a left lower lobe air space opacity  -cover for community acquired pneumonia - urine for legionella and strep pneumo negative  -influenza screen negative  -Blood cultures currently pending - stool culture unrevealing  -UA shows few bacteria, 0-2 WBCs, negative leukocytes and nitrites  -C. difficile negative  -Continue doxycycline for empiric coverage for Lyme disease/ehrlichiosis patient lives in an his  wife in a camper   -CSF HSV negative - culture no growth - gram stain w/ mononuclear WBCs and no organisms - cell count essentially normal - glucose high and protein normal, therefore not convincing of bacterial or even viral meningitis  -RMSF titer (acute) negative, but this does NOT rule out RMSF  -narrow abx to cover CAP and follow clinically  -Solu-Medrol 60 mg daily -DuoNeb QID -Flutter valve q 4hr when awake -Mucinex DM BID -Out of bed; ambulate patient q shift  Sepsis secondary to possible community-acquired pneumonia  -Patient currently afebrile with leukocytosis and possible pneumonia found on chest x-ray  -Continue azithromycin and ceftriaxone  -Legionella and strep pneumonia antigen pending -obtain a sputum culture and Gram stain  -CT of the chest rule out PE; negative  -Respiratory viral panel pending  Headache  -Resolved  -Will continue to monitor and provide pain medications as needed.   History of sinusitis  -Will continue nasal spray  -CT of the head: No acute intracranial findings   Dizziness  -Will continue meclizine as needed, currently no complaints of dizziness.   Hyponatremia and hypochloremia  -Resolved   -Continue normal saline at 75 ml/hr   -Continue to monitor closely   History of ocular herpes  -CSF HSV negative - no evidence of active HSV infection - stop acyclovir and folllow     GERD  -Increase Famotidine to 40 mg BID     Code Status: FULL Family Communication: no family present at time of exam Disposition Plan: Meningitis?    Consultants: NA  Procedure/Significant Events: 8/31 PCXR;1. Left lower lobe airspace opacity, suspicious for pneumonia.  -Right perihilar opacity,mild aspiration pneumonitis?;  orally bronchopneumonia on the right as well. 8/31 CT chest with contrast; Multifocal bilateral infiltrate consistent with pneumonia. Mediastinal and right hilar adenopathy presumably reactive.  -5 mm left upper lobe pulmonary nodule.    8/31 CT head without contrast; no acute findings   Culture 8/31 blood right forearm/antecubital NGTD 8/31 urine negative  8/31 CSF no growth x1 day, (note no organisms seen on Gram stain) 8/31 CSF viral culture pending 8/31 herpes pending 8/31 MRSA by PCR negative  8/31 stool negative   8/31 C. difficile by PCR negative  8/31 [place] antigen negative 8/31 RMSF IgM negative 8/31 HIV negative 9/1 influenza A./B. by PCR negative  9/1 respiratory virus panel negative  Antibiotics: Acyclovir 8/31>> stopped 9/2 Azithromycin 9/1>> Ceftriaxone 9/1>> Doxycycline 8/31>> stopped 9/2. Restarted 9/3>> Vancomycin 8/31>> stopped 8/31  DVT prophylaxis: Lovenox   Devices    LINES / TUBES:      Continuous Infusions: . sodium chloride 1,000 mL (03/02/14 0558)    Objective: VITAL SIGNS: Temp: 98.5 F (36.9 C) (09/03 0734) Temp src: Oral (09/03 0734) BP: 127/76 mmHg (09/03 0336) Pulse Rate: 80 (09/03 0336) SPO2; 96% on 1.5 L O2 via [place] FIO2:   Intake/Output Summary (Last 24 hours) at 03/02/14 0819 Last data filed at 03/02/14 0500  Gross per 24 hour  Intake 1507.5 ml  Output   2500 ml  Net -992.5 ml     Exam: General: A./O. x4, NAD, No acute respiratory distress Lungs: Diffuse expiratory wheezes, Negative crackles Cardiovascular: Regular rate and rhythm without murmur gallop or rub normal S1 and S2 Abdomen: Nontender, nondistended, soft, bowel sounds positive, no rebound, no ascites, no appreciable mass Extremities: No significant cyanosis, clubbing, or edema bilateral lower extremities Neurologic; cranial nerves II through XII intact, tongue/uvula midline, extremity strength 5/5, sensation intact, resting tremors (chronic) resolved today.   Data Reviewed: Basic Metabolic Panel:  Recent Labs Lab 02/27/14 1137 02/27/14 1934 03/01/14 0254 03/02/14 0244  NA 126*  --  130* 137  K 4.0  --  4.0 3.7  CL 87*  --  96 100  CO2 23  --  24 24  GLUCOSE 156*  --  116*  107*  BUN 14  --  10 11  CREATININE 1.21  --  1.06 0.97  CALCIUM 9.6  --  8.5 8.4  MG  --  1.6 1.9  --   PHOS  --  1.6*  --  3.2   Liver Function Tests:  Recent Labs Lab 03/01/14 0254 03/02/14 0244  AST 36 87*  ALT 35 60*  ALKPHOS 91 96  BILITOT 0.3 0.3  PROT 5.9* 5.8*  ALBUMIN 2.2* 2.1*   No results found for this basename: LIPASE, AMYLASE,  in the last 168 hours No results found for this basename: AMMONIA,  in the last 168 hours CBC:  Recent Labs Lab 02/27/14 1137 03/01/14 0254 03/02/14 0244  WBC 26.4* 16.7* 13.8*  NEUTROABS 23.0* 13.7*  --   HGB 15.4 13.0 12.7*  HCT 42.9 37.6* 36.1*  MCV 89.9 89.7 88.0  PLT 204 175 204   Cardiac Enzymes: No results found for this basename: CKTOTAL, CKMB, CKMBINDEX, TROPONINI,  in the last 168 hours BNP (last 3 results) No results found for this basename: PROBNP,  in the last 8760 hours CBG: No results found for this basename: GLUCAP,  in the last 168 hours  Recent Results (from the past 240 hour(s))  CULTURE, BLOOD (ROUTINE X 2)     Status: None   Collection  Time    02/27/14 12:10 PM      Result Value Ref Range Status   Specimen Description BLOOD RIGHT FOREARM   Final   Special Requests BOTTLES DRAWN AEROBIC AND ANAEROBIC 5CCS   Final   Culture  Setup Time     Final   Value: 02/27/2014 16:56     Performed at [facility]   Culture     Final   Value:        BLOOD CULTURE RECEIVED NO GROWTH TO DATE CULTURE WILL BE HELD FOR 5 DAYS BEFORE ISSUING A FINAL NEGATIVE REPORT     Performed at [facility]   Report Status PENDING   Incomplete  CULTURE, BLOOD (ROUTINE X 2)     Status: None   Collection Time    02/27/14 12:20 PM      Result Value Ref Range Status   Specimen Description BLOOD RIGHT ANTECUBITAL   Final   Special Requests BOTTLES DRAWN AEROBIC AND ANAEROBIC 5CCS   Final   Culture  Setup Time     Final   Value: 02/27/2014 16:56     Performed at [facility]   Culture     Final   Value:         BLOOD CULTURE RECEIVED NO GROWTH TO DATE CULTURE WILL BE HELD FOR 5 DAYS BEFORE ISSUING A FINAL NEGATIVE REPORT     Performed at [facility]   Report Status PENDING   Incomplete  URINE CULTURE     Status: None   Collection Time    02/27/14  2:16 PM      Result Value Ref Range Status   Specimen Description URINE, CLEAN CATCH   Final   Special Requests NONE   Final   Culture  Setup Time     Final   Value: 02/27/2014 17:05     Performed at [facility]     Final   Value: NO GROWTH     Performed at [facility]   Culture     Final   Value: NO GROWTH     Performed at [facility]   Report Status 02/28/2014 FINAL   Final  CSF CULTURE     Status: None   Collection Time    02/27/14  4:35 PM      Result Value Ref Range Status   Specimen Description CSF   Final   Special Requests 1.0ML CSF FLUID   Final   Gram Stain     Final   Value: WBC PRESENT, PREDOMINANTLY MONONUCLEAR     NO ORGANISMS SEEN     CYTOSPIN Performed at [facility]     Performed at [facility]   Culture     Final   Value: NO GROWTH 1 DAY     Performed at [facility]   Report Status PENDING   Incomplete  GRAM STAIN     Status: None   Collection Time    02/27/14  4:35 PM      Result Value Ref Range Status   Specimen Description CSF   Final   Special Requests 1.0ML CSF FLUID   Final   Gram Stain     Final   Value: CYTOSPIN PREP     WBC PRESENT, PREDOMINANTLY MONONUCLEAR     NO ORGANISMS SEEN   Report Status 02/27/2014 FINAL   Final  VIRAL CULTURE VIRC     Status:  None   Collection Time    02/27/14  4:46 PM      Result Value Ref Range Status   Specimen Description CSF   Final   Special Requests NONE   Final   Culture     Final   Value: Culture has been initiated.     Note:    The current Enterovirus culture system does not detect Enterovirus D68. If Enterovirus D68 is suspected, please call client services to add the test for  Enterovirus PCR (Test code [phone]).     Performed at [facility]   Report Status PENDING   Incomplete  MRSA PCR SCREENING     Status: None   Collection Time    02/27/14  7:12 PM      Result Value Ref Range Status   MRSA by PCR NEGATIVE  NEGATIVE Final   Comment:            The GeneXpert MRSA Assay (FDA     approved for NASAL specimens     only), is one component of a     comprehensive MRSA colonization     surveillance program. It is not     intended to diagnose MRSA     infection nor to guide or     monitor treatment for     MRSA infections.  STOOL CULTURE     Status: None   Collection Time    02/27/14 11:22 PM      Result Value Ref Range Status   Specimen Description STOOL   Final   Special Requests Normal   Final   Culture     Final   Value: NO SUSPICIOUS COLONIES, CONTINUING TO HOLD     Performed at [facility]   Report Status PENDING   Incomplete  CLOSTRIDIUM DIFFICILE BY PCR     Status: None   Collection Time    02/27/14 11:22 PM      Result Value Ref Range Status   C difficile by pcr NEGATIVE  NEGATIVE Final  RESPIRATORY VIRUS PANEL     Status: None   Collection Time    02/28/14  8:18 AM      Result Value Ref Range Status   Source - RVPAN NOSE   Final   Respiratory Syncytial Virus A NOT DETECTED   Final   Respiratory Syncytial Virus B NOT DETECTED   Final   Influenza A NOT DETECTED   Final   Influenza B NOT DETECTED   Final   Parainfluenza 1 NOT DETECTED   Final   Parainfluenza 2 NOT DETECTED   Final   Parainfluenza 3 NOT DETECTED   Final   Metapneumovirus NOT DETECTED   Final   Rhinovirus NOT DETECTED   Final   Adenovirus NOT DETECTED   Final   Influenza A H1 NOT DETECTED   Final   Influenza A H3 NOT DETECTED   Final   Comment: (NOTE)           Normal Reference Range for each Analyte: NOT DETECTED     Testing performed using the Luminex xTAG Respiratory Viral Panel test     kit.     The analytical performance characteristics of this assay  have been     determined by [facility].  The modifications have not been     cleared or approved by the FDA. This assay has been validated pursuant     to the CLIA regulations and is used for  clinical purposes.     Performed at [facility]     Studies:  Recent x-ray studies have been reviewed in detail by the Attending Physician  Scheduled Meds:  Scheduled Meds: . acidophilus  2 capsule Oral Daily  . amitriptyline  50 mg Oral QHS  . azithromycin  500 mg Intravenous Q24H  . cefTRIAXone (ROCEPHIN)  IV  2 g Intravenous Q24H  . enoxaparin (LOVENOX) injection  40 mg Subcutaneous Q24H  . sodium chloride  2 spray Each Nare QHS    Time spent on care of this patient: 40 mins   Allie Bossier , MD   [facility] Office  [phone] Pager - [phone]  On-Call/Text Page:      [email or web address]      password [email or web address]  If 7PM-7AM, please contact night-coverage [email or web address] Password [email or web address] 03/02/2014, 8:19 AM   LOS: 3 days

## 2014-03-02 NOTE — Care Management Note (Signed)
    Page 1 of 2   03/03/2014     4:39:48 PM CARE MANAGEMENT NOTE 03/03/2014  Patient:  [name], [name]   Account Number:  [id number]  Date Initiated:  02/28/2014  Documentation initiated by:  Marvetta Gibbons  Subjective/Objective Assessment:   Pt admitted with sepsis     Action/Plan:   PTA pt lived at home with spouse   Anticipated DC Date:  03/04/2014   Anticipated DC Plan:  [place]  CM consult      [facility]   Choice offered to / List presented to:  C-1 Patient   DME arranged  [facility]      DME agency  [place] arranged  [place].   Status of service:  Completed, signed off Medicare Important Message given?  YES (If response is "NO", the following Medicare IM given date fields will be blank) Date Medicare IM given:  03/02/2014 Medicare IM given by:  Marvetta Gibbons Date Additional Medicare IM given:   Additional Medicare IM given by:    Discharge Disposition:  [place]  Per UR Regulation:  Reviewed for med. necessity/level of care/duration of stay  If discussed at [place] of Stay Meetings, dates discussed:    Comments:  03/03/14- 1630- Marvetta Gibbons RN, BSN [phone] PT recommending [place] and [facility] for discharge- pt agreeable- orders in chart- spoke with pt at bedside and offered [facility] agency of choice for [facility]- pt chose [facility] for services- referral called to Butch Penny with [facility] for HH-PT- services to begin within 24-48 hr post discharge- spoke with Brenton Grills with [facility] regarding DME need- Kasandra Knudsen will be delivered to pt room prior to discharge.   03/02/14- 1415- Marvetta Gibbons RN, BSN [phone] Spoke with pt and wife at bedside- pt is usually independent with ADLs and does not use any DME at home- PT eval pending- NCM to follow for any recommendations-  plan to return home per pt- copy of mailed Medicare IM  given to pt and signed copy placed on shadow chart.

## 2014-03-03 LAB — H. PYLORI ANTIBODY, IGG: H PYLORI IGG: 0.6 {ISR}

## 2014-03-03 LAB — CSF CULTURE W GRAM STAIN

## 2014-03-03 LAB — CSF CULTURE: CULTURE: NO GROWTH

## 2014-03-03 LAB — GRAM STAIN

## 2014-03-03 LAB — STOOL CULTURE: Special Requests: NORMAL

## 2014-03-03 LAB — WEST NILE AB, IGG AND IGM, CSF: West Nile Ab, IgG, CSF: <1.3 index (ref <1.30)

## 2014-03-03 LAB — EXPECTORATED SPUTUM ASSESSMENT W GRAM STAIN, RFLX TO RESP C

## 2014-03-03 LAB — VDRL, CSF: VDRL Quant, CSF: NONREACTIVE

## 2014-03-03 MED ORDER — AZITHROMYCIN 500 MG PO TABS
500.0000 mg | ORAL_TABLET | Freq: Every day | ORAL | Status: DC
  Administered 2014-03-03: 500 mg via ORAL
  Filled 2014-03-03 (×3): qty 1

## 2014-03-03 MED ORDER — DOXYCYCLINE HYCLATE 100 MG PO TABS
100.0000 mg | ORAL_TABLET | Freq: Two times a day (BID) | ORAL | Status: DC
  Administered 2014-03-03 – 2014-03-06 (×6): 100 mg via ORAL
  Filled 2014-03-03 (×7): qty 1

## 2014-03-03 MED ORDER — PROMETHAZINE HCL 25 MG PO TABS: 25.0000 mg | ORAL_TABLET | Freq: Four times a day (QID) | ORAL | Status: DC | PRN

## 2014-03-03 MED ORDER — SODIUM CHLORIDE 0.9 % IV SOLN
1000.0000 mL | INTRAVENOUS | Status: DC
  Administered 2014-03-03: 1000 mL via INTRAVENOUS

## 2014-03-03 NOTE — Progress Notes (Signed)
HR in the 42-50s. Previously HR had been maintaining in the high 50-60s. No medications given to effect HR or BP administered. BP 120/61. K. Schorr notified. Awaiting response. Will continue to monitor.   Lum Babe, RN

## 2014-03-03 NOTE — ED Provider Notes (Signed)
I saw and evaluated the patient, reviewed the resident's note and I agree with the findings and plan.   EKG Interpretation None       I was present for the critical part of the exam. Pt was seen by me, Dr. Lucrezia Starch performed the LP.  Varney Biles, MD 03/03/14 [phone]

## 2014-03-03 NOTE — Evaluation (Signed)
Physical Therapy Evaluation Patient Details Name: [name] MRN: [id number] DOB: 26-Jul-1945 Today's Date: 03/03/2014   History of Present Illness  68 y.o. male who presented to the emergency department with complaints of fever, chills, and headache for 5 days. The patient began having fever and chills one week prior and presented to urgent care where he was noted to be tachycardic and febrile.  Work up underway.  Clinical Impression  Patient demonstrates deficits in mobility as indicated below. Will need continued skilled PT to address deficits and maximize function. Will see as indicated and progress as tolerated. Will need single point cane upon discharge.  OF NOTE: HR elevated to 138 with activity, increased instability with fatigue. Resting tremor noted at baseline pt reports more pronounced at this time.    Follow Up Recommendations Home health PT;Supervision for mobility/OOB    Equipment Recommendations  Cane    Recommendations for Other Services       Precautions / Restrictions Precautions Precautions: Fall      Mobility  Bed Mobility Overal bed mobility: Modified Independent             General bed mobility comments: increased time to perform  Transfers Overall transfer level: Needs assistance Equipment used: None Transfers: Sit to/from Stand Sit to Stand: Min guard         General transfer comment: instability upon standing  Ambulation/Gait Ambulation/Gait assistance: Min assist Ambulation Distance (Feet): 380 Feet Assistive device: None Gait Pattern/deviations: Step-through pattern;Staggering left;Staggering right;Narrow base of support Gait velocity: decreased Gait velocity interpretation: Below normal speed for age/gender General Gait Details: VCs for cadence and control of gait, Assist for stability, 3 noted LOB to the right, able to reach out for wall to self correct. Patient may benefit from use of cane.  Stairs            Wheelchair  Mobility    Modified Rankin (Stroke Patients Only)       Balance Overall balance assessment: Needs assistance         Standing balance support: No upper extremity supported;During functional activity Standing balance-Leahy Scale: Fair Standing balance comment: instability note             High level balance activites: Side stepping;Backward walking;Turns;Head turns High Level Balance Comments: min to moderate instability requiring assist             Pertinent Vitals/Pain Pain Assessment: No/denies pain    Home Living Family/patient expects to be discharged to:: Private residence Living Arrangements: Spouse/significant other;Children Available Help at Discharge: Family Type of Home: Mobile home (camper) Home Access: Stairs to enter Entrance Stairs-Rails: None Entrance Stairs-Number of Steps: 3 Home Layout: One level Home Equipment: None      Prior Function Level of Independence: Independent               Hand Dominance   Dominant Hand: Right    Extremity/Trunk Assessment               Lower Extremity Assessment: Generalized weakness (generalized incoordination )         Communication   Communication: No difficulties  Cognition Arousal/Alertness: Awake/alert Behavior During Therapy: WFL for tasks assessed/performed Overall Cognitive Status: Within Functional Limits for tasks assessed                      General Comments      Exercises        Assessment/Plan    PT Assessment Patient  needs continued PT services  PT Diagnosis Difficulty walking;Abnormality of gait;Generalized weakness   PT Problem List Decreased strength;Decreased activity tolerance;Decreased balance;Decreased mobility;Decreased coordination;Decreased knowledge of use of DME  PT Treatment Interventions DME instruction;Gait training;Stair training;Functional mobility training;Therapeutic activities;Therapeutic exercise;Balance training;Patient/family  education   PT Goals (Current goals can be found in the Care Plan section) Acute Rehab PT Goals Patient Stated Goal: to go home PT Goal Formulation: With patient Time For Goal Achievement: 03/17/14 Potential to Achieve Goals: Good    Frequency Min 3X/week   Barriers to discharge Inaccessible home environment lives in a trailer at this time while building house    Co-evaluation               End of [place] During Treatment: Gait belt Activity Tolerance: Patient tolerated treatment well Patient left: in bed;with call bell/phone within reach;with bed alarm set Nurse Communication: Mobility status (needs cane and HHPT, encouraged ambulation with staff)         Time: 1455-1520 PT Time Calculation (min): 25 min   Charges:   PT Evaluation $Initial PT Evaluation Tier I: 1 Procedure PT Treatments $Gait Training: 8-22 mins $Self Care/Home Management: 8-22   PT G CodesDuncan Dull 03/03/2014, 3:26 PM Alben Deeds, [place] DPT  [phone]

## 2014-03-03 NOTE — Progress Notes (Signed)
[facility] TEAM 1 - Stepdown/ICU TEAM Progress Note  KOLBEE BOGUSZ MOQ:[id number] DOB: [birth year] DOA: 02/27/2014 PCP: Georgetta Haber, MD  Admit HPI / Brief Narrative: 68 y.o. male who presented to the emergency department with complaints of fever, chills, and headache for 5 days. The patient began having fever and chills one week prior and presented to urgent care where he was noted to be tachycardic and febrile. Patient was then sent to the emergency department for further workup.Marland Kitchen  HPI/Subjective: Is sob w/ exertion, but no new complaints otherwise.  Denies cp, f/c, abdom pain, or HA.    Assessment/Plan:  Multifocal CAP (confirmed on CT scan) - previously Fever (103 max) of unknown origin   -urine for legionella and strep pneumo negative  -influenza screen negative  -Blood cultures no growth  -clinically much improved w/ abx for CAP   Sepsis secondary to community-acquired pneumonia  -sepsis physiology much improved - cont empiric abx tx   Asymptomatic bradycardia Occurred while pt asleep - pt admits to suspected episodes of apnea at home - will follow on tele over night - spoke w/ pt about need for outpt sleep study once recovered from CAP   5 mm left upper lobe pulmonary nodule -f/u CT of chest will be needed in 6 months - pt is not a smoker - discussed at length w/ pt   History of sinusitis  -continue nasal spray  -CT of the head: No acute intracranial findings   Dizziness  -Will continue meclizine as needed, currently no complaints of dizziness  Hyponatremia and hypochloremia  -due to hypovolemia - resovled w/ volume resuscitation  History of ocular herpes  -CSF HSV negative - no evidence of active HSV infection - stop acyclovir and folllow    GERD  -protonix  Code Status: FULL  Family Communication: no family present at time of exam Disposition Plan: SDU - probable d/c home in AM   Consultants: NA  Significant Events: 8/31 CT chest with  contrast; Multifocal bilateral infiltrate consistent with pneumonia. Mediastinal and right hilar adenopathy presumably reactive. -5 mm left upper lobe pulmonary nodule.  8/31 CT head without contrast; no acute findings  Antibiotics: Acyclovir 8/31 >> 9/2 Ceftriaxone 9/1>> Doxycycline 8/31>>  Vancomycin 8/31 Azithro 9/01 >>9/03  DVT prophylaxis: Lovenox  Objective: Blood pressure 121/56, pulse 97, temperature 97.8 F (36.6 C), temperature source Oral, resp. rate 27, height '5\' 8"'  (1.727 m), weight 90.3 kg (199 lb 1.2 oz), SpO2 92.00%.  Intake/Output Summary (Last 24 hours) at 03/03/14 1447 Last data filed at 03/03/14 1400  Gross per 24 hour  Intake   2550 ml  Output    100 ml  Net   2450 ml   Exam: General: No acute respiratory distress Lungs: bibasilar crackles - no wheeze - good air movement th/o other fields  Cardiovascular: Regular rate and rhythm without murmur gallop or rub normal S1 and S2 Abdomen: Nontender, nondistended, soft, bowel sounds positive, no rebound, no ascites, no appreciable mass Extremities: No significant cyanosis, clubbing, or edema bilateral lower extremities  Data Reviewed: Basic Metabolic Panel:  Recent Labs Lab 02/27/14 1137 02/27/14 1934 03/01/14 0254 03/02/14 0244  NA 126*  --  130* 137  K 4.0  --  4.0 3.7  CL 87*  --  96 100  CO2 23  --  24 24  GLUCOSE 156*  --  116* 107*  BUN 14  --  10 11  CREATININE 1.21  --  1.06 0.97  CALCIUM 9.6  --  8.5 8.4  MG  --  1.6 1.9  --   PHOS  --  1.6*  --  3.2   Liver Function Tests:  Recent Labs Lab 03/01/14 0254 03/02/14 0244  AST 36 87*  ALT 35 60*  ALKPHOS 91 96  BILITOT 0.3 0.3  PROT 5.9* 5.8*  ALBUMIN 2.2* 2.1*   CBC:  Recent Labs Lab 02/27/14 1137 03/01/14 0254 03/02/14 0244  WBC 26.4* 16.7* 13.8*  NEUTROABS 23.0* 13.7*  --   HGB 15.4 13.0 12.7*  HCT 42.9 37.6* 36.1*  MCV 89.9 89.7 88.0  PLT 204 175 204    Recent Results (from the past 240 hour(s))  CULTURE, BLOOD  (ROUTINE X 2)     Status: None   Collection Time    02/27/14 12:10 PM      Result Value Ref Range Status   Specimen Description BLOOD RIGHT FOREARM   Final   Special Requests BOTTLES DRAWN AEROBIC AND ANAEROBIC 5CCS   Final   Culture  Setup Time     Final   Value: 02/27/2014 16:56     Performed at [facility]   Culture     Final   Value:        BLOOD CULTURE RECEIVED NO GROWTH TO DATE CULTURE WILL BE HELD FOR 5 DAYS BEFORE ISSUING A FINAL NEGATIVE REPORT     Performed at [facility]   Report Status PENDING   Incomplete  CULTURE, BLOOD (ROUTINE X 2)     Status: None   Collection Time    02/27/14 12:20 PM      Result Value Ref Range Status   Specimen Description BLOOD RIGHT ANTECUBITAL   Final   Special Requests BOTTLES DRAWN AEROBIC AND ANAEROBIC 5CCS   Final   Culture  Setup Time     Final   Value: 02/27/2014 16:56     Performed at [facility]   Culture     Final   Value:        BLOOD CULTURE RECEIVED NO GROWTH TO DATE CULTURE WILL BE HELD FOR 5 DAYS BEFORE ISSUING A FINAL NEGATIVE REPORT     Performed at [facility]   Report Status PENDING   Incomplete  URINE CULTURE     Status: None   Collection Time    02/27/14  2:16 PM      Result Value Ref Range Status   Specimen Description URINE, CLEAN CATCH   Final   Special Requests NONE   Final   Culture  Setup Time     Final   Value: 02/27/2014 17:05     Performed at [facility]     Final   Value: NO GROWTH     Performed at [facility]   Culture     Final   Value: NO GROWTH     Performed at [facility]   Report Status 02/28/2014 FINAL   Final  CSF CULTURE     Status: None   Collection Time    02/27/14  4:35 PM      Result Value Ref Range Status   Specimen Description CSF   Final   Special Requests 1.0ML CSF FLUID   Final   Gram Stain     Final   Value: WBC PRESENT, PREDOMINANTLY MONONUCLEAR     NO ORGANISMS SEEN     CYTOSPIN Performed at [facility]     Performed at  [facility]     Final   Value: NO GROWTH 3 DAYS     Performed at [facility]   Report Status 03/03/2014 FINAL   Final  GRAM STAIN     Status: None   Collection Time    02/27/14  4:35 PM      Result Value Ref Range Status   Specimen Description CSF   Final   Special Requests 1.0ML CSF FLUID   Final   Gram Stain     Final   Value: CYTOSPIN PREP     WBC PRESENT, PREDOMINANTLY MONONUCLEAR     NO ORGANISMS SEEN   Report Status 02/27/2014 FINAL   Final  VIRAL CULTURE VIRC     Status: None   Collection Time    02/27/14  4:46 PM      Result Value Ref Range Status   Specimen Description CSF   Final   Special Requests NONE   Final   Culture     Final   Value: CONTINUING TO HOLD     Note:    The current Enterovirus culture system does not detect Enterovirus D68. If Enterovirus D68 is suspected, please call client services to add the test for Enterovirus PCR (Test code [id number]).     Performed at [facility]   Report Status PENDING   Incomplete  MRSA PCR SCREENING     Status: None   Collection Time    02/27/14  7:12 PM      Result Value Ref Range Status   MRSA by PCR NEGATIVE  NEGATIVE Final   Comment:            The GeneXpert MRSA Assay (FDA     approved for NASAL specimens     only), is one component of a     comprehensive MRSA colonization     surveillance program. It is not     intended to diagnose MRSA     infection nor to guide or     monitor treatment for     MRSA infections.  STOOL CULTURE     Status: None   Collection Time    02/27/14 11:22 PM      Result Value Ref Range Status   Specimen Description STOOL   Final   Special Requests Normal   Final   Culture     Final   Value: NO SALMONELLA, SHIGELLA, CAMPYLOBACTER, YERSINIA, OR E.COLI 0157:H7 ISOLATED     Performed at [facility]   Report Status 03/03/2014 FINAL   Final  CLOSTRIDIUM DIFFICILE BY PCR     Status: None   Collection Time     02/27/14 11:22 PM      Result Value Ref Range Status   C difficile by pcr NEGATIVE  NEGATIVE Final  RESPIRATORY VIRUS PANEL     Status: None   Collection Time    02/28/14  8:18 AM      Result Value Ref Range Status   Source - RVPAN NOSE   Final   Respiratory Syncytial Virus A NOT DETECTED   Final   Respiratory Syncytial Virus B NOT DETECTED   Final   Influenza A NOT DETECTED   Final   Influenza B NOT DETECTED   Final   Parainfluenza 1 NOT DETECTED   Final   Parainfluenza 2 NOT DETECTED   Final   Parainfluenza 3 NOT DETECTED   Final   Metapneumovirus NOT DETECTED   Final  Rhinovirus NOT DETECTED   Final   Adenovirus NOT DETECTED   Final   Influenza A H1 NOT DETECTED   Final   Influenza A H3 NOT DETECTED   Final   Comment: (NOTE)           Normal Reference Range for each Analyte: NOT DETECTED     Testing performed using the Luminex xTAG Respiratory Viral Panel test     kit.     The analytical performance characteristics of this assay have been     determined by [facility].  The modifications have not been     cleared or approved by the FDA. This assay has been validated pursuant     to the CLIA regulations and is used for clinical purposes.     Performed at [facility]     Studies:  Recent x-ray studies have been reviewed in detail by the Attending Physician  Scheduled Meds:  Scheduled Meds: . acidophilus  2 capsule Oral Daily  . amitriptyline  50 mg Oral QHS  . antiseptic oral rinse  7 mL Mouth Rinse BID  . azithromycin  500 mg Oral QHS  . cefTRIAXone (ROCEPHIN)  IV  2 g Intravenous Q24H  . dextromethorphan-guaiFENesin  1 tablet Oral BID  . doxycycline  100 mg Oral Q12H  . enoxaparin (LOVENOX) injection  40 mg Subcutaneous Q24H  . ipratropium-albuterol  3 mL Nebulization Q6H  . methylPREDNISolone (SOLU-MEDROL) injection  60 mg Intravenous Q24H  . sodium chloride  2 spray Each Nare QHS    Time spent on care of this patient: 35 mins  Cherene Altes, MD [facility] For Consults/Admissions - Flow Manager - [phone] Office  [phone] Pager [phone]  On-Call/Text Page:      [email or web address]      password [facility]  03/03/2014, 2:47 PM   LOS: 4 days

## 2014-03-03 NOTE — Progress Notes (Signed)
Occupational Therapy Evaluation Patient Details Name: [name] MRN: [id number] DOB: Feb 08, 1946 Today's Date: 03/03/2014    History of Present Illness 68 y.o. male who presented to the emergency department with complaints of fever, chills, and headache for 5 days. The patient began having fever and chills one week prior and presented to urgent care where he was noted to be tachycardic and febrile.  Work up underway.   Clinical Impression   PTA, wife assisted with ADL if needed due to tremor. Addressed apparent intention tremor. Pt issued weighted utensils in addition to wrist cuff to assist with self feeding. Pt reports increased success with AE with decreased spillage. No further OT needs. Rec staff ambulate pt. Rec pt have initial 24/7 S after D/C. OT signing off.     Follow Up Recommendations  No OT follow up;Supervision/Assistance - 24 hour (initially)    Equipment Recommendations  None recommended by OT    Recommendations for Other Services       Precautions / Restrictions Precautions Precautions: Fall      Mobility Bed Mobility Overal bed mobility: Modified Independent             General bed mobility comments: increased time to perform  Transfers Overall transfer level: Needs assistance Equipment used: None Transfers: Sit to/from Stand Sit to Stand: S             Balance Overall balance assessment: Needs assistance         Standing balance support: No upper extremity supported;During functional activity Standing balance-Leahy Scale: Fair                         ADL Overall ADL's : Needs assistance/impaired Eating/Feeding: Moderate assistance;Sitting Eating/Feeding Details (indicate cue type and reason): limited by intention tremor, which is exacerbated when pt is sick. At times, pt unable to hold utensil to feed self Grooming: Minimal assistance;Sitting Grooming Details (indicate cue type and reason): limited by tremor Upper Body  Bathing: Minimal assitance;Set up   Lower Body Bathing: Minimal assistance   Upper Body Dressing : Minimal assistance   Lower Body Dressing: Minimal assistance   Toilet Transfer: Supervision/safety   Toileting- Clothing Manipulation and Hygiene: Min guard       Functional mobility during ADLs: Min guard General ADL Comments: Pt's independence with ADL is limited at times due to intention tremor. Pt assessed with use of weighted utensil and weighted cuff, which appear to help with self feeding. Also issued weighted cup with lid (pt states wife will assist with ADL)     Vision                     Perception     Praxis      Pertinent Vitals/Pain Pain Assessment: No/denies pain     Hand Dominance Right   Extremity/Trunk Assessment Upper Extremity Assessment Upper Extremity Assessment: Generalized weakness (intention tremor. RT worse than LT)   Lower Extremity Assessment Lower Extremity Assessment: Defer to PT evaluation   Cervical / Trunk Assessment Cervical / Trunk Assessment: Normal   Communication Communication Communication: No difficulties   Cognition Arousal/Alertness: Awake/alert Behavior During Therapy: WFL for tasks assessed/performed Overall Cognitive Status: Within Functional Limits for tasks assessed                     General Comments       Exercises       Shoulder Instructions  Home Living Family/patient expects to be discharged to:: Private residence Living Arrangements: Spouse/significant other;Children Available Help at Discharge: Family Type of Home: Mobile home (camper) Home Access: Stairs to enter Technical brewer of Steps: 3 Entrance Stairs-Rails: None Home Layout: One level     Bathroom Shower/Tub: Occupational psychologist: [place]: None          Prior Functioning/Environment Level of Independence: Independent        Comments: PTA, pt dealing with intention  tremor    OT Diagnosis:     OT Problem List:     OT Treatment/Interventions:      OT Goals(Current goals can be found in the care plan section) Acute Rehab OT Goals Patient Stated Goal: to go home OT Goal Formulation:  (eval only)  OT Frequency:     Barriers to D/C:            Co-evaluation              End of Session Nurse Communication: Mobility status  Activity Tolerance: Patient tolerated treatment well Patient left: in bed;with call bell/phone within reach;with bed alarm set   Time: 1532-1610 OT Time Calculation (min): 38 min Charges:  OT General Charges $OT Visit: 1 Procedure OT Evaluation $Initial OT Evaluation Tier I: 1 Procedure OT Treatments $Self Care/Home Management : 23-37 mins G-Codes:    Kacie Huxtable,H[place] 03/30/14, 4:53 PM   [facility], OTR/L  [phone] 03/30/2014

## 2014-03-04 ENCOUNTER — Inpatient Hospital Stay (HOSPITAL_COMMUNITY): Payer: Medicare Other

## 2014-03-04 LAB — COMPREHENSIVE METABOLIC PANEL
ALT: 156 U/L — ABNORMAL HIGH (ref 0–53)
AST: 117 U/L — AB (ref 0–37)
Albumin: 2.2 g/dL — ABNORMAL LOW (ref 3.5–5.2)
Alkaline Phosphatase: 103 U/L (ref 39–117)
Anion gap: 14 (ref 5–15)
BUN: 14 mg/dL (ref 6–23)
CALCIUM: 8.9 mg/dL (ref 8.4–10.5)
CO2: 22 meq/L (ref 19–32)
Chloride: 100 mEq/L (ref 96–112)
Creatinine, Ser: 0.82 mg/dL (ref 0.50–1.35)
GFR calc Af Amer: >90 mL/min (ref >90)
GFR calc non Af Amer: 89 mL/min — ABNORMAL LOW (ref >90)
Glucose, Bld: 243 mg/dL — ABNORMAL HIGH (ref 70–99)
Potassium: 3.9 mEq/L (ref 3.7–5.3)
Sodium: 136 mEq/L — ABNORMAL LOW (ref 137–147)
Total Bilirubin: 0.2 mg/dL — ABNORMAL LOW (ref 0.3–1.2)
Total Protein: 5.6 g/dL — ABNORMAL LOW (ref 6.0–8.3)

## 2014-03-04 LAB — CBC
HCT: 35.3 % — ABNORMAL LOW (ref 39.0–52.0)
Hemoglobin: 12.2 g/dL — ABNORMAL LOW (ref 13.0–17.0)
MCH: 30.9 pg (ref 26.0–34.0)
MCHC: 34.6 g/dL (ref 30.0–36.0)
MCV: 89.4 fL (ref 78.0–100.0)
PLATELETS: 283 10*3/uL (ref 150–400)
RBC: 3.95 MIL/uL — ABNORMAL LOW (ref 4.22–5.81)
RDW: 13.7 % (ref 11.5–15.5)
WBC: 23.6 10*3/uL — ABNORMAL HIGH (ref 4.0–10.5)

## 2014-03-04 LAB — H.PYLORI ANTIGEN, STOOL

## 2014-03-04 MED ORDER — IPRATROPIUM-ALBUTEROL 0.5-2.5 (3) MG/3ML IN SOLN: 3.0000 mL | RESPIRATORY_TRACT | Status: DC | PRN

## 2014-03-04 MED ORDER — SODIUM CHLORIDE 0.9 % IV SOLN: 1000.0000 mL | INTRAVENOUS | Status: DC

## 2014-03-04 MED ORDER — IBUPROFEN 200 MG PO TABS
200.0000 mg | ORAL_TABLET | ORAL | Status: DC | PRN
  Filled 2014-03-04: qty 2

## 2014-03-04 NOTE — Progress Notes (Signed)
[facility] TEAM 1 - Stepdown/ICU TEAM Progress Note  ADDISON WHIDBEE MWU:[id number] DOB: 1945/07/11 DOA: 02/27/2014 PCP: Georgetta Haber, MD  Admit HPI / Brief Narrative: 68 y.o. male who presented to the emergency department with complaints of fever, chills, and headache for 5 days. The patient began having fever and chills one week prior and presented to urgent care where he was noted to be tachycardic and febrile. Patient was then sent to the emergency department for further workup.Marland Kitchen  HPI/Subjective: No new complaints over the last 24hrs.  Denies cp or sob at rest.    Assessment/Plan:  Multifocal CAP (confirmed on CT scan) - previously Fever (103 max) of unknown origin   -urine for legionella and strep pneumo negative  -influenza screen negative  -Blood cultures no growth  -clinically much improved w/ abx for CAP, and CXR stable, but WBC climbed significantly today - need to monitor an additional 24hrs  -simplify abx to rocephin + doxy, as doxy will cover atypicals    Sepsis secondary to community-acquired pneumonia  -sepsis physiology much improved - cont empiric abx tx as discussed above   Asymptomatic bradycardia occurred while pt asleep - pt admits to suspected episodes of apnea at home - follow on tele - spoke w/ pt about need for outpt sleep study once recovered from CAP   5 mm left upper lobe pulmonary nodule -f/u CT of chest will be needed in 6 months - pt is not a smoker - discussed at length w/ pt   History of sinusitis  -continue nasal spray  -CT of the head: No acute intracranial findings - is more than adequately covered w/ current abx   Dizziness  -Will continue meclizine as needed, currently no complaints of dizziness  Hyponatremia and hypochloremia  -due to hypovolemia - resovled w/ volume resuscitation  History of ocular herpes  -CSF 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 Mediastinal and right hilar adenopathy presumably reactive. -5 mm left upper lobe pulmonary nodule.  8/31 CT head without contrast; no acute findings  Antibiotics: Acyclovir 8/31 >> 9/2 Ceftriaxone 9/1>> Doxycycline 8/31>>  Vancomycin 8/31 Azithro 9/01 >>9/04  DVT prophylaxis: Lovenox  Objective: Blood pressure 144/71, pulse 70, temperature 98.1 F (36.7 C), temperature source Oral, resp. rate 22, height '5\' 8"'  (1.727 m), weight 90.3 kg (199 lb 1.2 oz), SpO2 94.00%.  Intake/Output Summary (Last 24 hours) at 03/04/14 1229 Last data filed at 03/04/14 0700  Gross per 24 hour  Intake   1425 ml  Output    750 ml  Net    675 ml   Exam: General: No acute respiratory distress Lungs: bibasilar crackles less pronounced - no wheeze - good air movement th/o other fields  Cardiovascular: Regular rate and rhythm without murmur gallop or rub normal S1 and S2 Abdomen: Nontender, nondistended, soft, bowel sounds positive, no rebound, no ascites, no appreciable mass Extremities: No significant cyanosis, clubbing, or edema bilateral lower extremities  Data Reviewed: Basic Metabolic Panel:  Recent Labs Lab 02/27/14 1137 02/27/14 1934 03/01/14 0254 03/02/14 0244 03/04/14 0100  NA 126*  --  130* 137 136*  K 4.0  --  4.0 3.7 3.9  CL 87*  --  96 100 100  CO2 23  --  '24 24 22  ' GLUCOSE 156*  --  116* 107* 243*  BUN 14  --  '10 11 14  ' CREATININE 1.21  --  1.06 0.97 0.82  CALCIUM 9.6  --  8.5 8.4 8.9  MG  --  1.6 1.9  --   --   PHOS  --  1.6*  --  3.2  --    Liver Function Tests:  Recent Labs Lab 03/01/14 0254 03/02/14 0244 03/04/14 0100  AST 36 87* 117*  ALT 35 60*  156*  ALKPHOS 91 96 103  BILITOT 0.3 0.3 0.2*  PROT 5.9* 5.8* 5.6*  ALBUMIN 2.2* 2.1* 2.2*   CBC:  Recent Labs Lab 02/27/14 1137 03/01/14 0254 03/02/14 0244 03/04/14 0100  WBC 26.4* 16.7* 13.8* 23.6*  NEUTROABS 23.0* 13.7*  --   --   HGB 15.4 13.0 12.7* 12.2*  HCT 42.9 37.6* 36.1* 35.3*  MCV 89.9 89.7 88.0 89.4  PLT 204 175 204 283    Recent Results (from the past 240 hour(s))  CULTURE, BLOOD (ROUTINE X 2)     Status: None   Collection Time    02/27/14 12:10 PM      Result Value Ref Range Status   Specimen Description BLOOD RIGHT FOREARM   Final   Special Requests BOTTLES DRAWN AEROBIC AND ANAEROBIC 5CCS   Final   Culture  Setup Time     Final   Value: 02/27/2014 16:56     Performed at [facility]   Culture     Final   Value:        BLOOD CULTURE RECEIVED NO GROWTH TO DATE CULTURE WILL BE HELD FOR 5 DAYS BEFORE ISSUING A FINAL NEGATIVE REPORT     Performed at [facility]   Report Status PENDING   Incomplete  CULTURE, BLOOD (ROUTINE X 2)     Status: None   Collection Time    02/27/14 12:20 PM      Result Value Ref Range Status   Specimen Description BLOOD RIGHT ANTECUBITAL   Final   Special Requests BOTTLES DRAWN AEROBIC AND ANAEROBIC 5CCS   Final   Culture  Setup Time     Final   Value: 02/27/2014 16:56     Performed at [facility]   Culture     Final   Value:        BLOOD CULTURE RECEIVED NO GROWTH TO DATE CULTURE WILL BE HELD FOR 5 DAYS BEFORE ISSUING A FINAL NEGATIVE REPORT     Performed at [facility]   Report Status PENDING   Incomplete  URINE CULTURE     Status: None   Collection Time    02/27/14  2:16 PM      Result Value Ref Range Status   Specimen Description URINE, CLEAN CATCH   Final   Special Requests NONE   Final   Culture  Setup Time     Final   Value: 02/27/2014 17:05     Performed at [facility]     Final   Value: NO GROWTH     Performed at [facility]   Culture     Final     Value: NO GROWTH     Performed at [facility]   Report Status 02/28/2014 FINAL  Final  CSF CULTURE     Status: None   Collection Time    02/27/14  4:35 PM      Result Value Ref Range Status   Specimen Description CSF   Final   Special Requests 1.0ML CSF FLUID   Final   Gram Stain     Final   Value: WBC PRESENT, PREDOMINANTLY MONONUCLEAR     NO ORGANISMS SEEN     CYTOSPIN Performed at [facility]     Performed at [facility]   Culture     Final   Value: NO GROWTH 3 DAYS     Performed at [facility]   Report Status 03/03/2014 FINAL   Final  GRAM STAIN     Status: None   Collection Time    02/27/14  4:35 PM      Result Value Ref Range Status   Specimen Description CSF   Final   Special Requests 1.0ML CSF FLUID   Final   Gram Stain     Final   Value: CYTOSPIN PREP     WBC PRESENT, PREDOMINANTLY MONONUCLEAR     NO ORGANISMS SEEN   Report Status 02/27/2014 FINAL   Final  VIRAL CULTURE VIRC     Status: None   Collection Time    02/27/14  4:46 PM      Result Value Ref Range Status   Specimen Description CSF   Final   Special Requests NONE   Final   Culture     Final   Value: CONTINUING TO HOLD     Note:    The current Enterovirus culture system does not detect Enterovirus D68. If Enterovirus D68 is suspected, please call client services to add the test for Enterovirus PCR (Test code [phone]).     Performed at [facility]   Report Status PENDING   Incomplete  MRSA PCR SCREENING     Status: None   Collection Time    02/27/14  7:12 PM      Result Value Ref Range Status   MRSA by PCR NEGATIVE  NEGATIVE Final   Comment:            The GeneXpert MRSA Assay (FDA     approved for NASAL specimens     only), is one component of a     comprehensive MRSA colonization     surveillance program. It is not     intended to diagnose MRSA     infection nor to guide or     monitor treatment for     MRSA infections.  STOOL CULTURE     Status:  None   Collection Time    02/27/14 11:22 PM      Result Value Ref Range Status   Specimen Description STOOL   Final   Special Requests Normal   Final   Culture     Final   Value: NO SALMONELLA, SHIGELLA, CAMPYLOBACTER, YERSINIA, OR E.COLI 0157:H7 ISOLATED     Performed at [facility]   Report Status 03/03/2014 FINAL   Final  CLOSTRIDIUM DIFFICILE BY PCR     Status: None   Collection Time    02/27/14 11:22 PM      Result Value Ref Range Status   C difficile by pcr NEGATIVE  NEGATIVE Final  RESPIRATORY VIRUS PANEL     Status: None   Collection Time    02/28/14  8:18 AM      Result  Value Ref Range Status   Source - RVPAN NOSE   Final   Respiratory Syncytial Virus A NOT DETECTED   Final   Respiratory Syncytial Virus B NOT DETECTED   Final   Influenza A NOT DETECTED   Final   Influenza B NOT DETECTED   Final   Parainfluenza 1 NOT DETECTED   Final   Parainfluenza 2 NOT DETECTED   Final   Parainfluenza 3 NOT DETECTED   Final   Metapneumovirus NOT DETECTED   Final   Rhinovirus NOT DETECTED   Final   Adenovirus NOT DETECTED   Final   Influenza A H1 NOT DETECTED   Final   Influenza A H3 NOT DETECTED   Final   Comment: (NOTE)           Normal Reference Range for each Analyte: NOT DETECTED     Testing performed using the Lumine[employer] xTAG Respiratory Viral Panel test     kit.     The analytical performance characteristics of this assay have been     determined by [facility].  The modifications have not been     cleared or approved by the FDA. This assay has been validated pursuant     to the CLIA regulations and is used for clinical purposes.     Performed at [facility].PYLORI ANTIGEN, STOOL     Status: None   Collection Time    03/03/14 12:10 PM      Result Value Ref Range Status   Specimen Description STOOL   Final   Special Requests NONE   Final   H. pylori ag, stool     Final   Value: NEGATIVE Antimicrobials,proton pump inhibitors and bismuth  preparations are known to suppress H.pylori and ingestion of these prior to testing may cause a false negative result. If a negative result is obtained for a patient that has  ingested these      compounds within two weeks prior of performing the H.pylori test, results may be falsely negative and should be repeated with a new specimen two weeks after discontinuing treatment.     Performed at [facility]   Report Status 03/04/2014 FINAL   Final  CULTURE, EXPECTORATED SPUTUM-ASSESSMENT     Status: None   Collection Time    03/03/14  2:55 PM      Result Value Ref Range Status   Specimen Description SPUTUM   Final   Special Requests NONE   Final   Sputum evaluation     Final   Value: THIS SPECIMEN IS ACCEPTABLE. RESPIRATORY CULTURE REPORT TO FOLLOW.   Report Status 03/03/2014 FINAL   Final  GRAM STAIN     Status: None   Collection Time    03/03/14  2:55 PM      Result Value Ref Range Status   Specimen Description SPUTUM   Final   Special Requests NONE   Final   Gram Stain     Final   Value: MODERATE WBC PRESENT,BOTH PMN AND MONONUCLEAR     FEW SQUAMOUS EPITHELIAL CELLS PRESENT     FEW GRAM POSITIVE RODS     RARE GRAM NEGATIVE RODS     RARE GRAM POSITIVE COCCI IN PAIRS   Report Status 03/03/2014 FINAL   Final  CULTURE, RESPIRATORY (NON-EXPECTORATED)     Status: None   Collection Time    03/03/14  2:55 PM      Result Value Ref Range Status  Specimen Description SPUTUM   Final   Special Requests NONE   Final   Gram Stain     Final   Value: MODERATE WBC PRESENT,BOTH PMN AND MONONUCLEAR     FEW SQUAMOUS EPITHELIAL CELLS PRESENT     FEW GRAM POSITIVE RODS     RARE GRAM NEGATIVE RODS     RARE GRAM POSITIVE COCCI   Culture PENDING   Incomplete   Report Status PENDING   Incomplete     Studies:  Recent x-ray studies have been reviewed in detail by the Attending Physician  Scheduled Meds:  Scheduled Meds: . acidophilus  2 capsule Oral Daily  . amitriptyline  50 mg Oral  QHS  . antiseptic oral rinse  7 mL Mouth Rinse BID  . azithromycin  500 mg Oral QHS  . cefTRIAXone (ROCEPHIN)  IV  2 g Intravenous Q24H  . dextromethorphan-guaiFENesin  1 tablet Oral BID  . doxycycline  100 mg Oral Q12H  . enoxaparin (LOVENOX) injection  40 mg Subcutaneous Q24H  . ipratropium-albuterol  3 mL Nebulization Q6H  . sodium chloride  2 spray Each Nare QHS    Time spent on care of this patient: 35 mins  Cherene Altes, MD [facility] For Consults/Admissions - Flow Manager - [phone] Office  [phone] Pager [phone]  On-Call/Text Page:      [email or web address]      password [facility]  03/04/2014, 12:29 PM   LOS: 5 days

## 2014-03-05 LAB — CBC
HCT: 37.5 % — ABNORMAL LOW (ref 39.0–52.0)
HEMOGLOBIN: 13.2 g/dL (ref 13.0–17.0)
MCH: 31.8 pg (ref 26.0–34.0)
MCHC: 35.2 g/dL (ref 30.0–36.0)
MCV: 90.4 fL (ref 78.0–100.0)
Platelets: 353 10*3/uL (ref 150–400)
RBC: 4.15 MIL/uL — AB (ref 4.22–5.81)
RDW: 13.9 % (ref 11.5–15.5)
WBC: 23.7 10*3/uL — AB (ref 4.0–10.5)

## 2014-03-05 LAB — HEMOGLOBIN A1C
HEMOGLOBIN A1C: 6.5 % — AB (ref <5.7)
Mean Plasma Glucose: 140 mg/dL — ABNORMAL HIGH (ref <117)

## 2014-03-05 LAB — CULTURE, BLOOD (ROUTINE X 2)
CULTURE: NO GROWTH
Culture: NO GROWTH

## 2014-03-05 LAB — COMPREHENSIVE METABOLIC PANEL
ALBUMIN: 2.4 g/dL — AB (ref 3.5–5.2)
ALK PHOS: 95 U/L (ref 39–117)
ALT: 129 U/L — AB (ref 0–53)
ANION GAP: 12 (ref 5–15)
AST: 65 U/L — AB (ref 0–37)
BUN: 13 mg/dL (ref 6–23)
CHLORIDE: 99 meq/L (ref 96–112)
CO2: 25 mEq/L (ref 19–32)
Calcium: 8.8 mg/dL (ref 8.4–10.5)
Creatinine, Ser: 0.89 mg/dL (ref 0.50–1.35)
GFR calc Af Amer: >90 mL/min (ref >90)
GFR calc non Af Amer: 87 mL/min — ABNORMAL LOW (ref >90)
Glucose, Bld: 88 mg/dL (ref 70–99)
POTASSIUM: 3.8 meq/L (ref 3.7–5.3)
SODIUM: 136 meq/L — AB (ref 137–147)
TOTAL PROTEIN: 5.8 g/dL — AB (ref 6.0–8.3)
Total Bilirubin: 0.5 mg/dL (ref 0.3–1.2)

## 2014-03-05 LAB — HEPATITIS PANEL, ACUTE
HCV AB: NEGATIVE
Hep A IgM: NONREACTIVE
Hep B C IgM: NONREACTIVE
Hepatitis B Surface Ag: NEGATIVE

## 2014-03-05 LAB — CULTURE, RESPIRATORY: Culture: NORMAL

## 2014-03-05 LAB — CULTURE, RESPIRATORY W GRAM STAIN

## 2014-03-05 LAB — PROCALCITONIN: Procalcitonin: 0.32 ng/mL

## 2014-03-05 MED ORDER — DEXTROSE 5 % IV SOLN
1.0000 g | INTRAVENOUS | Status: DC
  Administered 2014-03-06: 1 g via INTRAVENOUS
  Filled 2014-03-05: qty 10

## 2014-03-05 MED ORDER — PROMETHAZINE HCL 25 MG PO TABS: 12.5000 mg | ORAL_TABLET | Freq: Four times a day (QID) | ORAL | Status: DC | PRN

## 2014-03-05 MED ORDER — GUAIFENESIN ER 600 MG PO TB12
600.0000 mg | ORAL_TABLET | Freq: Two times a day (BID) | ORAL | Status: DC
  Administered 2014-03-05 – 2014-03-06 (×2): 600 mg via ORAL
  Filled 2014-03-05 (×4): qty 1

## 2014-03-05 MED ORDER — SALINE SPRAY 0.65 % NA SOLN
2.0000 | Freq: Three times a day (TID) | NASAL | Status: DC
  Administered 2014-03-05 – 2014-03-06 (×3): 2 via NASAL
  Filled 2014-03-05: qty 44

## 2014-03-05 NOTE — Progress Notes (Signed)
[facility] TEAM 1 - Stepdown/ICU TEAM Progress Note  [name] OEV:[id number] DOB: 14-Nov-1945 DOA: 02/27/2014 PCP: Georgetta Haber, MD  Admit HPI / Brief Narrative: 68 yo male who presented to the emergency department with complaints of fever, chills, and headache for 5 days. The patient began having fever and chills one week prior and presented to urgent care where he was noted to be tachycardic and febrile. Patient was then sent to the emergency department for further workup.Marland Kitchen  HPI/Subjective: Has been having trouble w/ desaturations into the upper 80s since last night.  No new complaints from pt himself.    Assessment/Plan:  Multifocal CAP (confirmed on CT scan) - previously Fever (103 max) of unknown origin   -urine for legionella and strep pneumo negative  -influenza screen negative  -Blood cultures no growth  -clinically much improved w/ abx for CAP, and CXR stable, but WBC remains elevated again today/slighly increased - need to monitor an additional 24hrs  -simplify abx to rocephin + doxy, as doxy will cover atypicals    Sepsis secondary to community-acquired pneumonia  -sepsis physiology much improved - cont empiric abx tx as discussed above   Asymptomatic bradycardia occurred while pt asleep - pt admits to suspected episodes of apnea at home - followed on tele - spoke w/ pt about need for outpt sleep study once recovered from CAP - no evidence of persisting or symptomatic bradycardia   5 mm left upper lobe pulmonary nodule f/u CT of chest will be needed in 6 months - pt is not a smoker - discussed at length w/ pt   History of sinusitis  continue nasal spray - CT of the head: No acute intracranial findings - is more than adequately covered w/ current abx   Dizziness  meclizine as needed, currently no complaints of dizziness  Hyponatremia and hypochloremia  due to hypovolemia - resovled w/ volume resuscitation  History of ocular herpes  CSF HSV negative - no  evidence of active HSV infection   GERD  protonix  Hyperglycemia no hx of DM - A1c 6.5, c/w diagnosis of DM - discuss w/ pt - consider metformin at time of d/c   Mild transaminitis Unexpected - recheck notes LFTs improving - follow trend   Code Status: FULL  Family Communication: spoke w/ wife at bedside  Disposition Plan: stable for medical bed - wean O2 - ambulatory O2 sat - follow WBC  Consultants: NA  Significant Events: 8/31 CT chest with contrast; Multifocal bilateral infiltrate consistent with pneumonia. Mediastinal and right hilar adenopathy presumably reactive. -5 mm left upper lobe pulmonary nodule.  8/31 CT head without contrast; no acute findings  Antibiotics: Acyclovir 8/31 >> 9/2 Ceftriaxone 9/1>> Doxycycline 8/31>>  Vancomycin 8/31 Azithro 9/01 >>9/04  DVT prophylaxis: Lovenox  Objective: Blood pressure 140/81, pulse 70, temperature 98.4 F (36.9 C), temperature source Oral, resp. rate 19, height '5\' 8"'  (1.727 m), weight 90.3 kg (199 lb 1.2 oz), SpO2 94.00%.  Intake/Output Summary (Last 24 hours) at 03/05/14 1518 Last data filed at 03/05/14 0900  Gross per 24 hour  Intake   1905 ml  Output   1075 ml  Net    830 ml   Exam: General: No acute respiratory distress Lungs: diffuse crackles - no wheeze  Cardiovascular: Regular rate and rhythm without murmur gallop or rub  Abdomen: Nontender, nondistended, soft, bowel sounds positive, no rebound, no ascites, no appreciable mass Extremities: No significant cyanosis, clubbing, or edema bilateral lower extremities  Data Reviewed: Basic  Metabolic Panel:  Recent Labs Lab 02/27/14 1137 02/27/14 1934 03/01/14 0254 03/02/14 0244 03/04/14 0100 03/05/14 0346  NA 126*  --  130* 137 136* 136*  K 4.0  --  4.0 3.7 3.9 3.8  CL 87*  --  96 100 100 99  CO2 23  --  '24 24 22 25  ' GLUCOSE 156*  --  116* 107* 243* 88  BUN 14  --  '10 11 14 13  ' CREATININE 1.21  --  1.06 0.97 0.82 0.89  CALCIUM 9.6  --  8.5 8.4 8.9  8.8  MG  --  1.6 1.9  --   --   --   PHOS  --  1.6*  --  3.2  --   --    Liver Function Tests:  Recent Labs Lab 03/01/14 0254 03/02/14 0244 03/04/14 0100 03/05/14 0346  AST 36 87* 117* 65*  ALT 35 60* 156* 129*  ALKPHOS 91 96 103 95  BILITOT 0.3 0.3 0.2* 0.5  PROT 5.9* 5.8* 5.6* 5.8*  ALBUMIN 2.2* 2.1* 2.2* 2.4*   CBC:  Recent Labs Lab 02/27/14 1137 03/01/14 0254 03/02/14 0244 03/04/14 0100 03/05/14 0346  WBC 26.4* 16.7* 13.8* 23.6* 23.7*  NEUTROABS 23.0* 13.7*  --   --   --   HGB 15.4 13.0 12.7* 12.2* 13.2  HCT 42.9 37.6* 36.1* 35.3* 37.5*  MCV 89.9 89.7 88.0 89.4 90.4  PLT 204 175 204 283 353    Recent Results (from the past 240 hour(s))  CULTURE, BLOOD (ROUTINE X 2)     Status: None   Collection Time    02/27/14 12:10 PM      Result Value Ref Range Status   Specimen Description BLOOD RIGHT FOREARM   Final   Special Requests BOTTLES DRAWN AEROBIC AND ANAEROBIC 5CCS   Final   Culture  Setup Time     Final   Value: 02/27/2014 16:56     Performed at [facility]   Culture     Final   Value: NO GROWTH 5 DAYS     Performed at [facility]   Report Status 03/05/2014 FINAL   Final  CULTURE, BLOOD (ROUTINE X 2)     Status: None   Collection Time    02/27/14 12:20 PM      Result Value Ref Range Status   Specimen Description BLOOD RIGHT ANTECUBITAL   Final   Special Requests BOTTLES DRAWN AEROBIC AND ANAEROBIC 5CCS   Final   Culture  Setup Time     Final   Value: 02/27/2014 16:56     Performed at [facility]   Culture     Final   Value: NO GROWTH 5 DAYS     Performed at [facility]   Report Status 03/05/2014 FINAL   Final  URINE CULTURE     Status: None   Collection Time    02/27/14  2:16 PM      Result Value Ref Range Status   Specimen Description URINE, CLEAN CATCH   Final   Special Requests NONE   Final   Culture  Setup Time     Final   Value: 02/27/2014 17:05     Performed at [place] [facility]      Final   Value: NO GROWTH     Performed at [facility]   Culture     Final   Value: NO GROWTH     Performed  at [facility]   Report Status 02/28/2014 FINAL   Final  CSF CULTURE     Status: None   Collection Time    02/27/14  4:35 PM      Result Value Ref Range Status   Specimen Description CSF   Final   Special Requests 1.0ML CSF FLUID   Final   Gram Stain     Final   Value: WBC PRESENT, PREDOMINANTLY MONONUCLEAR     NO ORGANISMS SEEN     CYTOSPIN Performed at [facility]     Performed at [facility]   Culture     Final   Value: NO GROWTH 3 DAYS     Performed at [facility]   Report Status 03/03/2014 FINAL   Final  GRAM STAIN     Status: None   Collection Time    02/27/14  4:35 PM      Result Value Ref Range Status   Specimen Description CSF   Final   Special Requests 1.0ML CSF FLUID   Final   Gram Stain     Final   Value: CYTOSPIN PREP     WBC PRESENT, PREDOMINANTLY MONONUCLEAR     NO ORGANISMS SEEN   Report Status 02/27/2014 FINAL   Final  VIRAL CULTURE VIRC     Status: None   Collection Time    02/27/14  4:46 PM      Result Value Ref Range Status   Specimen Description CSF   Final   Special Requests NONE   Final   Culture     Final   Value: CONTINUING TO HOLD     Note:    The current Enterovirus culture system does not detect Enterovirus D68. If Enterovirus D68 is suspected, please call client services to add the test for Enterovirus PCR (Test code [phone]).     Performed at [facility]   Report Status PENDING   Incomplete  MRSA PCR SCREENING     Status: None   Collection Time    02/27/14  7:12 PM      Result Value Ref Range Status   MRSA by PCR NEGATIVE  NEGATIVE Final   Comment:            The GeneXpert MRSA Assay (FDA     approved for NASAL specimens     only), is one component of a     comprehensive MRSA colonization     surveillance program. It is not     intended to diagnose MRSA     infection nor to  guide or     monitor treatment for     MRSA infections.  STOOL CULTURE     Status: None   Collection Time    02/27/14 11:22 PM      Result Value Ref Range Status   Specimen Description STOOL   Final   Special Requests Normal   Final   Culture     Final   Value: NO SALMONELLA, SHIGELLA, CAMPYLOBACTER, YERSINIA, OR E.COLI 0157:H7 ISOLATED     Performed at [facility]   Report Status 03/03/2014 FINAL   Final  CLOSTRIDIUM DIFFICILE BY PCR     Status: None   Collection Time    02/27/14 11:22 PM      Result Value Ref Range Status   C difficile by pcr NEGATIVE  NEGATIVE Final  RESPIRATORY VIRUS PANEL     Status: None   Collection Time  02/28/14  8:18 AM      Result Value Ref Range Status   Source - RVPAN NOSE   Final   Respiratory Syncytial Virus A NOT DETECTED   Final   Respiratory Syncytial Virus B NOT DETECTED   Final   Influenza A NOT DETECTED   Final   Influenza B NOT DETECTED   Final   Parainfluenza 1 NOT DETECTED   Final   Parainfluenza 2 NOT DETECTED   Final   Parainfluenza 3 NOT DETECTED   Final   Metapneumovirus NOT DETECTED   Final   Rhinovirus NOT DETECTED   Final   Adenovirus NOT DETECTED   Final   Influenza A H1 NOT DETECTED   Final   Influenza A H3 NOT DETECTED   Final   Comment: (NOTE)           Normal Reference Range for each Analyte: NOT DETECTED     Testing performed using the Luminex xTAG Respiratory Viral Panel test     kit.     The analytical performance characteristics of this assay have been     determined by [facility].  The modifications have not been     cleared or approved by the FDA. This assay has been validated pursuant     to the CLIA regulations and is used for clinical purposes.     Performed at [facility].PYLORI ANTIGEN, STOOL     Status: None   Collection Time    03/03/14 12:10 PM      Result Value Ref Range Status   Specimen Description STOOL   Final   Special Requests NONE   Final   H. pylori ag, stool      Final   Value: NEGATIVE Antimicrobials,proton pump inhibitors and bismuth preparations are known to suppress H.pylori and ingestion of these prior to testing may cause a false negative result. If a negative result is obtained for a patient that has  ingested these      compounds within two weeks prior of performing the H.pylori test, results may be falsely negative and should be repeated with a new specimen two weeks after discontinuing treatment.     Performed at [facility]   Report Status 03/04/2014 FINAL   Final  CULTURE, EXPECTORATED SPUTUM-ASSESSMENT     Status: None   Collection Time    03/03/14  2:55 PM      Result Value Ref Range Status   Specimen Description SPUTUM   Final   Special Requests NONE   Final   Sputum evaluation     Final   Value: THIS SPECIMEN IS ACCEPTABLE. RESPIRATORY CULTURE REPORT TO FOLLOW.   Report Status 03/03/2014 FINAL   Final  GRAM STAIN     Status: None   Collection Time    03/03/14  2:55 PM      Result Value Ref Range Status   Specimen Description SPUTUM   Final   Special Requests NONE   Final   Gram Stain     Final   Value: MODERATE WBC PRESENT,BOTH PMN AND MONONUCLEAR     FEW SQUAMOUS EPITHELIAL CELLS PRESENT     FEW GRAM POSITIVE RODS     RARE GRAM NEGATIVE RODS     RARE GRAM POSITIVE COCCI IN PAIRS   Report Status 03/03/2014 FINAL   Final  CULTURE, RESPIRATORY (NON-EXPECTORATED)     Status: None   Collection Time    03/03/14  2:55 PM  Result Value Ref Range Status   Specimen Description SPUTUM   Final   Special Requests NONE   Final   Gram Stain     Final   Value: MODERATE WBC PRESENT,BOTH PMN AND MONONUCLEAR     FEW SQUAMOUS EPITHELIAL CELLS PRESENT     FEW GRAM POSITIVE RODS     RARE GRAM NEGATIVE RODS     RARE GRAM POSITIVE COCCI   Culture     Final   Value: NORMAL OROPHARYNGEAL FLORA     Performed at [facility]   Report Status 03/05/2014 FINAL   Final     Studies:  Recent x-ray studies have been  reviewed in detail by the Attending Physician  Scheduled Meds:  Scheduled Meds: . acidophilus  2 capsule Oral Daily  . amitriptyline  50 mg Oral QHS  . antiseptic oral rinse  7 mL Mouth Rinse BID  . cefTRIAXone (ROCEPHIN)  IV  2 g Intravenous Q24H  . dextromethorphan-guaiFENesin  1 tablet Oral BID  . doxycycline  100 mg Oral Q12H  . enoxaparin (LOVENOX) injection  40 mg Subcutaneous Q24H  . sodium chloride  2 spray Each Nare QHS    Time spent on care of this patient: 35 mins  Cherene Altes, MD [facility] For Consults/Admissions - Flow Manager - [phone] Office  [phone] Pager [phone]  On-Call/Text Page:      [email or web address]      password [facility]  03/05/2014, 3:18 PM   LOS: 6 days

## 2014-03-05 NOTE — Progress Notes (Signed)
Received orders to transfer pt, pt aware of transfer. VS stable. Report called to RN on 5W.

## 2014-03-05 NOTE — Progress Notes (Signed)
NURSING PROGRESS NOTE  [name] [id number] Transfer Data: 03/05/2014 4:36 PM Attending Provider: Cherene Altes, MD BUL:AGTXMIW,OEHO EDWARD, MD Code Status: FULL  [name] is a 68 y.o. male patient transferred from [place]  -No acute distress noted.  -No complaints of shortness of breath.  -No complaints of chest pain.    Blood pressure 144/78, pulse 95, temperature 97.5 F (36.4 C), temperature source Oral, resp. rate 20, height 5\' 8"  (1.727 m), weight 90.3 kg (199 lb 1.2 oz), SpO2 93.00%.   IV Fluids:Left Forearm SL.  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  Social History:   reports that he quit smoking about 38 years ago. He does not have any smokeless tobacco history on file. He reports that he does not drink alcohol or use illicit drugs.  Skin: Intact  Patient/Family orientated to room. Information packet given to patient/family. Admission inpatient armband information verified with patient/family to include name and date of birth and  placed on patient arm. Side rails up x 2, fall assessment and education completed with patient/family. Patient/family able to verbalize understanding of risk associated with falls and verbalized understanding to call for assistance before getting out of bed. Call light within reach. Patient/family able to voice and demonstrate understanding of unit orientation instructions.    Will continue to evaluate and treat per MD orders.

## 2014-03-06 ENCOUNTER — Inpatient Hospital Stay (HOSPITAL_COMMUNITY): Payer: Medicare Other

## 2014-03-06 LAB — CBC
HCT: 40.1 % (ref 39.0–52.0)
Hemoglobin: 13.8 g/dL (ref 13.0–17.0)
MCH: 31.4 pg (ref 26.0–34.0)
MCHC: 34.4 g/dL (ref 30.0–36.0)
MCV: 91.1 fL (ref 78.0–100.0)
Platelets: 372 10*3/uL (ref 150–400)
RBC: 4.4 MIL/uL (ref 4.22–5.81)
RDW: 14 % (ref 11.5–15.5)
WBC: 24.2 10*3/uL — ABNORMAL HIGH (ref 4.0–10.5)

## 2014-03-06 LAB — PROCALCITONIN: Procalcitonin: 0.2 ng/mL

## 2014-03-06 LAB — VIRAL CULTURE VIRC

## 2014-03-06 LAB — COMPREHENSIVE METABOLIC PANEL
ALBUMIN: 2.5 g/dL — AB (ref 3.5–5.2)
ALT: 91 U/L — ABNORMAL HIGH (ref 0–53)
ANION GAP: 13 (ref 5–15)
AST: 46 U/L — ABNORMAL HIGH (ref 0–37)
Alkaline Phosphatase: 96 U/L (ref 39–117)
BILIRUBIN TOTAL: 0.5 mg/dL (ref 0.3–1.2)
BUN: 14 mg/dL (ref 6–23)
CO2: 23 mEq/L (ref 19–32)
Calcium: 8.9 mg/dL (ref 8.4–10.5)
Chloride: 97 mEq/L (ref 96–112)
Creatinine, Ser: 0.92 mg/dL (ref 0.50–1.35)
GFR calc Af Amer: >90 mL/min (ref >90)
GFR, EST NON AFRICAN AMERICAN: 85 mL/min — AB (ref >90)
Glucose, Bld: 101 mg/dL — ABNORMAL HIGH (ref 70–99)
POTASSIUM: 4.5 meq/L (ref 3.7–5.3)
Sodium: 133 mEq/L — ABNORMAL LOW (ref 137–147)
Total Protein: 6 g/dL (ref 6.0–8.3)

## 2014-03-06 MED ORDER — CEFUROXIME AXETIL 500 MG PO TABS
500.0000 mg | ORAL_TABLET | Freq: Two times a day (BID) | ORAL | Status: DC
  Filled 2014-03-06: qty 1

## 2014-03-06 MED ORDER — ASPIRIN 81 MG PO TBEC: 81.0000 mg | DELAYED_RELEASE_TABLET | Freq: Every day | ORAL | Status: AC

## 2014-03-06 MED ORDER — CEFUROXIME AXETIL 500 MG PO TABS: 500.0000 mg | ORAL_TABLET | Freq: Two times a day (BID) | ORAL | Status: AC

## 2014-03-06 MED ORDER — DOXYCYCLINE HYCLATE 100 MG PO TABS: 100.0000 mg | ORAL_TABLET | Freq: Two times a day (BID) | ORAL | Status: AC

## 2014-03-06 MED ORDER — ASPIRIN EC 81 MG PO TBEC
81.0000 mg | DELAYED_RELEASE_TABLET | Freq: Every day | ORAL | Status: DC
  Filled 2014-03-06: qty 1

## 2014-03-06 MED ORDER — DOXYCYCLINE HYCLATE 100 MG PO TABS
100.0000 mg | ORAL_TABLET | Freq: Two times a day (BID) | ORAL | Status: DC
Start: 2014-03-06 — End: 2014-03-06
  Filled 2014-03-06: qty 1

## 2014-03-06 MED ORDER — RISAQUAD PO CAPS: 2.0000 | ORAL_CAPSULE | Freq: Every day | ORAL | Status: DC

## 2014-03-06 NOTE — Discharge Summary (Signed)
DISCHARGE SUMMARY  QUINDON DENKER  MR#: [id number]  DOB:September 16, 1945  Date of Admission: 02/27/2014 Date of Discharge: 03/06/2014  Attending Physician:MCCLUNG,JEFFREY T  Patient's ZVJ:KQASUOR,VIFB EDWARD, MD  Consults:  None  Disposition: D/C home   Follow-up Appts:     Follow-up Information   Follow up with Georgetta Haber, MD. (Keep your scheduled appointment to visit Dr. Adline Mango replacement to establish medical care.)    Specialty:  Internal Medicine   Contact information:   [place] [phone]      Tests Needing Follow-up: - f/u CT of chest will be needed in 6 months for re-eval of 17m LUL pulmonary nodule  - f/u CBC is suggested in 10-14 days to assure WBC has normalized  - f/u LFT eval is suggested in 10-14 days to assure transaminases have fully normalized - the pt should be scheduled to have a sleep study to screen for sleep apnea after he has fully recovered from his CAP - CBG/A1c will need to be followed as an outpt as A1c met criteria for actual DM - consider initiation of ACE or ARB if BP remains elevated once pt better recovered from CAP/sepsis   Discharge Diagnoses: Multifocal CAP (confirmed on CT scan) - previously Fever (103 max) of unknown origin  Sepsis secondary to community-acquired pneumonia  Asymptomatic bradycardia  5 mm left upper lobe pulmonary nodule  History of sinusitis  Dizziness   Hyponatremia and hypochloremia  History of ocular herpes  GERD  Hyperglycemia  Mild transaminitis   Initial presentation: 68yo male who presented to the emergency department with complaints of fever, chills, and headache for 5 days. The patient began having fever and chills one week prior and presented to urgent care where he was noted to be tachycardic and febrile. Patient was then sent to the emergency department for further workup..Marland Kitchen  Hospital Course:  Multifocal CAP (confirmed on CT scan) - previously Fever (103  max) of unknown origin  -urine for legionella and strep pneumo negative  -influenza screen negative  -Blood cultures no growth - sputum cx unrevealing  -clinically much improved w/ abx for CAP, and CXR stable/slighly improved, but WBC remained stubbornly elevated - nonetheless, the pt himself has improved markedly, is no longer requiring oxygen, and is afebrile, so prolonged hospitalization is not currently warranted  -simplify abx to ceftin + doxy, as doxy will cover atypicals and plan to complete a 14 day courese  Sepsis secondary to community-acquired pneumonia  -sepsis physiology resolved - cont empiric abx tx as discussed above   Asymptomatic bradycardia - PROBABLE SLEEP APNEA occurred while pt asleep - pt admits to suspected episodes of apnea at home - followed on tele - spoke w/ pt about need for outpt sleep study once recovered from CAP - no evidence of persisting or symptomatic bradycardia   5 mm left upper lobe pulmonary nodule  f/u CT of chest will be needed in 6 months - pt is not a smoker - discussed at length w/ pt   History of sinusitis  continue nasal spray prn - CT of the head: No acute intracranial findings - is more than adequately covered w/ current abx   Dizziness  meclizine as needed - no complaints of dizziness at time of d/c    Hyponatremia and hypochloremia  due to hypovolemia - resovled w/ volume resuscitation   History of ocular herpes  CSF HSV negative - no evidence of active HSV infection   GERD  protonix  Hyperglycemia  no hx of DM - A1c 6.5, technically c/w diagnosis of DM - discussed w/ pt - will not add medical tx at this time, but advised on diet adjustment and exercise/weight loss - pt states he has already lost ~10# over the last month, so there is a reasonable change that a f/u A1c in another 3 months will not meet criteria for DM - will need close outpt f/u - begin ASA daily - counseled on low sugar, low carb diet and the need for ongoing weight  loss and exercise - will need lipid level checked in f/u, but can't add tx currently as LFTs mildly elevated so I have not checked lipids while here   Mild transaminitis  Unexpected - recheck notes LFTs improving - follow trend     Medication List    STOP taking these medications       ACIDOPHILUS EXTRA STRENGTH Caps     promethazine 50 MG tablet  Commonly known as:  PHENERGAN      TAKE these medications       amitriptyline 25 MG tablet  Commonly known as:  ELAVIL  Take 25 mg by mouth 2 (two) times daily. Take 25 mg by mouth at 2200 (10PM) and take 25 mg by mouth at 0000 (12 AM)     aspirin 81 MG EC tablet  Take 1 tablet (81 mg total) by mouth daily.     cefUROXime 500 MG tablet  Commonly known as:  CEFTIN  Take 1 tablet (500 mg total) by mouth 2 (two) times daily with a meal.  Start taking on:  03/07/2014     doxycycline 100 MG tablet  Commonly known as:  VIBRA-TABS  Take 1 tablet (100 mg total) by mouth every 12 (twelve) hours.  Start taking on:  03/07/2014     meclizine 12.5 MG tablet  Commonly known as:  ANTIVERT  Take 25 mg by mouth every 6 (six) hours as needed for dizziness.     PHILLIPS COLON HEALTH Caps  Take 1 capsule by mouth daily.     acidophilus Caps capsule  Take 2 capsules by mouth daily.     sodium chloride 0.65 % Soln nasal spray  Commonly known as:  OCEAN  Place 2 sprays into both nostrils at bedtime.     valACYclovir 500 MG tablet  Commonly known as:  VALTREX  Take 500 mg by mouth daily as needed (for sores).       Day of Discharge BP 140/90  Pulse 85  Temp(Src) 98.6 F (37 C) (Oral)  Resp 18  Ht '5\' 8"'  (1.727 m)  Wt 90.3 kg (199 lb 1.2 oz)  BMI 30.28 kg/m2  SpO2 94%  Physical Exam: General: No acute respiratory distress Lungs: Clear to auscultation bilaterally without wheezes or crackles Cardiovascular: Regular rate and rhythm without murmur gallop or rub normal S1 and S2 Abdomen: Nontender, nondistended, soft, bowel sounds  positive, no rebound, no ascites, no appreciable mass Extremities: No significant cyanosis, clubbing, or edema bilateral lower extremities  Results for orders placed during the hospital encounter of 02/27/14 (from the past 24 hour(s))  COMPREHENSIVE METABOLIC PANEL     Status: Abnormal   Collection Time    03/06/14  4:51 AM      Result Value Ref Range   Sodium 133 (*) 137 - 147 mEq/L   Potassium 4.5  3.7 - 5.3 mEq/L   Chloride 97  96 - 112 mEq/L   CO2 23  19 -  32 mEq/L   Glucose, Bld 101 (*) 70 - 99 mg/dL   BUN 14  6 - 23 mg/dL   Creatinine, Ser 0.92  0.50 - 1.35 mg/dL   Calcium 8.9  8.4 - 10.5 mg/dL   Total Protein 6.0  6.0 - 8.3 g/dL   Albumin 2.5 (*) 3.5 - 5.2 g/dL   AST 46 (*) 0 - 37 U/L   ALT 91 (*) 0 - 53 U/L   Alkaline Phosphatase 96  39 - 117 U/L   Total Bilirubin 0.5  0.3 - 1.2 mg/dL   GFR calc non Af Amer 85 (*) >90 mL/min   GFR calc Af Amer >90  >90 mL/min   Anion gap 13  5 - 15  CBC     Status: Abnormal   Collection Time    03/06/14  4:51 AM      Result Value Ref Range   WBC 24.2 (*) 4.0 - 10.5 K/uL   RBC 4.40  4.22 - 5.81 MIL/uL   Hemoglobin 13.8  13.0 - 17.0 g/dL   HCT 40.1  39.0 - 52.0 %   MCV 91.1  78.0 - 100.0 fL   MCH 31.4  26.0 - 34.0 pg   MCHC 34.4  30.0 - 36.0 g/dL   RDW 14.0  11.5 - 15.5 %   Platelets 372  150 - 400 K/uL  PROCALCITONIN     Status: None   Collection Time    03/06/14  4:51 AM      Result Value Ref Range   Procalcitonin 0.20      Time spent in discharge (includes decision making & examination of pt): >35 minutes  03/06/2014, 2:29 PM   Cherene Altes, MD [facility] Office  [phone] Pager [phone]  On-Call/Text Page:      [email or web address]      password [facility]

## 2014-03-06 NOTE — Progress Notes (Signed)
Pt has o2 sat 95% on 3 liter oxygen via [place] and is attempted to wean. Pt is placed on 2 liter at this time and is asked for deep breath and cough. Will cont to monitor.

## 2014-03-06 NOTE — Progress Notes (Signed)
SATURATION QUALIFICATIONS: (This note is used to comply with regulatory documentation for home oxygen)  Patient Saturations on Room Air at Rest = 95%  Patient Saturations on Room Air while Ambulating = 94%   

## 2014-03-06 NOTE — Progress Notes (Signed)
Physical Therapy Treatment Patient Details Name: [name] MRN: [id number] DOB: [birth year] Today's Date: 03/06/2014    History of Present Illness 68 y.o. male who presented to the emergency department with complaints of fever, chills, and headache for 5 days. The patient began having fever and chills one week prior and presented to urgent care where he was noted to be tachycardic and febrile.  Work up underway.    PT Comments    Patient appears to functioning close to baseline and able to tolerate ambulating community distances and negotiating steps without LOB or difficulty. Tolerated higher level balance activities without LOB. Pt's balance improved from prior session. Discharge and equipment recommendations updated due to improvement in function. Education provided to buy [facility] if any noticeable changes in balance once discharged. Pt does not require further skilled therapy services at this time as pt is functioning close to baseline. Discharge from therapy. Recommend continued mobility while in hospital to maintain strength.   Follow Up Recommendations  No PT follow up;Supervision for mobility/OOB     Equipment Recommendations  None recommended by PT    Recommendations for Other Services       Precautions / Restrictions Precautions Precautions: None Restrictions Weight Bearing Restrictions: No    Mobility  Bed Mobility Overal bed mobility: Modified Independent             General bed mobility comments: HOB elevated, use of rail.  Transfers Overall transfer level: Needs assistance Equipment used: None Transfers: Sit to/from Stand Sit to Stand: Supervision            Ambulation/Gait Ambulation/Gait assistance: Supervision Ambulation Distance (Feet): 500 Feet Assistive device: None Gait Pattern/deviations: Step-through pattern;Narrow base of support;Decreased stride length;Drifts right/left     General Gait Details: Performed higher level balance challenges  during gait training - see balance section. VC to widen BoS for stability. No LOB noted throughout gait training.    Stairs Stairs: Yes Stairs assistance: Supervision Stair Management: Alternating pattern;One rail Right Number of Stairs: 11 General stair comments: Supervision for stair negotiation. No LOB. VC for technique and sequencing. 1 resting break on top of steps.  Wheelchair Mobility    Modified Rankin (Stroke Patients Only)       Balance Overall balance assessment: Independent           Standing balance-Leahy Scale: Normal Standing balance comment: Able to reach outside BoS without LOB.              High level balance activites: Side stepping;Backward walking;Direction changes;Turns;Sudden stops;Head turns High Level Balance Comments: Pt able to tolerate above high level balance activities without LOB. Performed side stepping both directions ~50'; performing head turns in all directions without LOB; performed changes in gait speed sufficiently without changing gait pattern.  Able to perform 360 degree turns, sudden stops and weave in figure eight pattern with no LOB.    Cognition Arousal/Alertness: Awake/alert Behavior During Therapy: WFL for tasks assessed/performed Overall Cognitive Status: Within Functional Limits for tasks assessed                      Exercises      General Comments        Pertinent Vitals/Pain Pain Assessment: No/denies pain    Home Living                      Prior Function            PT Goals (current  goals can now be found in the care plan section) Progress towards PT goals: Goals met/education completed, patient discharged from PT    Frequency       PT Plan Discharge plan needs to be updated;Equipment recommendations need to be updated    Co-evaluation             End of Session Equipment Utilized During Treatment: Gait belt Activity Tolerance: Patient tolerated treatment well Patient  left: in bed;with call bell/phone within reach;with family/visitor present     Time: 1212-1230 PT Time Calculation (min): 18 min  Charges:  $Gait Training: 8-22 mins                    G CodesCandy Sledge A 08-Mar-2014, 1:03 PM Candy Sledge, [place], DPT [phone]

## 2014-03-06 NOTE — Discharge Instructions (Signed)

## 2014-03-06 NOTE — Progress Notes (Signed)
[name] to be D/C'd Home per MD order.  Discussed with the patient and all questions fully answered.    Medication List    STOP taking these medications       ACIDOPHILUS EXTRA STRENGTH Caps     promethazine 50 MG tablet  Commonly known as:  PHENERGAN      TAKE these medications       amitriptyline 25 MG tablet  Commonly known as:  ELAVIL  Take 25 mg by mouth 2 (two) times daily. Take 25 mg by mouth at 2200 (10PM) and take 25 mg by mouth at 0000 (12 AM)     aspirin 81 MG EC tablet  Take 1 tablet (81 mg total) by mouth daily.     cefUROXime 500 MG tablet  Commonly known as:  CEFTIN  Take 1 tablet (500 mg total) by mouth 2 (two) times daily with a meal.  Start taking on:  03/07/2014     doxycycline 100 MG tablet  Commonly known as:  VIBRA-TABS  Take 1 tablet (100 mg total) by mouth every 12 (twelve) hours.  Start taking on:  03/07/2014     meclizine 12.5 MG tablet  Commonly known as:  ANTIVERT  Take 25 mg by mouth every 6 (six) hours as needed for dizziness.     PHILLIPS COLON HEALTH Caps  Take 1 capsule by mouth daily.     acidophilus Caps capsule  Take 2 capsules by mouth daily.     sodium chloride 0.65 % Soln nasal spray  Commonly known as:  OCEAN  Place 2 sprays into both nostrils at bedtime.     valACYclovir 500 MG tablet  Commonly known as:  VALTREX  Take 500 mg by mouth daily as needed (for sores).        VVS, Skin clean, dry and intact without evidence of skin break down, no evidence of skin tears noted. IV catheter discontinued intact. Dressing and pressure applied.  An After Visit Summary was printed and given to the patient.  D/c education completed with patient/family including follow up instructions, medication list, d/c activities limitations if indicated, with other d/c instructions as indicated by MD - patient able to verbalize understanding, all questions fully answered.   Patient instructed to return to ED, call 911, or call MD for any  changes in condition.   Patient escorted via [place], and D/C home via private auto.  [name] 03/06/2014 2:57 PM

## 2014-03-06 NOTE — Progress Notes (Signed)
CARE MANAGEMENT NOTE 03/06/2014  Patient:  [name], [name]   Account Number:  [id number]  Date Initiated:  02/28/2014  Documentation initiated by:  Marvetta Gibbons  Subjective/Objective Assessment:   Pt admitted with sepsis     Action/Plan:   PTA pt lived at home with spouse   Anticipated DC Date:  03/04/2014   Anticipated DC Plan:  [place]  CM consult      [place]   Choice offered to / List presented to:  C-1 Patient        [place] arranged  [place] - 11 Patient Refused      Status of service:  Completed, signed off Medicare Important Message given?  YES (If response is "NO", the following Medicare IM given date fields will be blank) Date Medicare IM given:  03/02/2014 Medicare IM given by:  Marvetta Gibbons Date Additional Medicare IM given:  03/06/2014 Additional Medicare IM given by:  Jonnie Finner  Discharge Disposition:  [place]  Per UR Regulation:  Reviewed for med. necessity/level of care/duration of stay  If discussed at [place] of Stay Meetings, dates discussed:    Comments:  03/06/2014 1440 NCM spoke to pt and states he is stronger since admission. States HH PT and cane is not needed. Notified [facility] to cancel cane and HH. Jonnie Finner RN CCM Case Mgmt phone [phone]  03/03/14- [place] RN, BSN [phone] PT recommending [place] and cane for discharge- pt agreeable- orders in chart- spoke with pt at bedside and offered [facility] agency of choice for [facility]- pt chose [facility] for services- referral called to Butch Penny with [facility] for HH-PT- services to begin within 24-48 hr post discharge- spoke with Brenton Grills with [facility] regarding DME need- Kasandra Knudsen will be delivered to pt room prior to discharge.   03/02/14- 1415- Marvetta Gibbons RN, BSN [phone] Spoke with pt and wife at bedside- pt is usually independent with ADLs and does not use any DME at home- PT eval pending- NCM to  follow for any recommendations-  plan to return home per pt- copy of mailed Medicare IM given to pt and signed copy placed on shadow chart.

## 2014-03-16 LAB — B. BURGDORFI ANTIBODIES, CSF: LYME AB: NEGATIVE

## 2014-03-30 NOTE — ED Provider Notes (Signed)
CSN: [id number]     Arrival date & time 02/27/14  1106 History   First MD Initiated Contact with Patient 02/27/14 1120     Chief Complaint  Patient presents with  . Fever     (Consider location/radiation/quality/duration/timing/severity/associated sxs/prior Treatment) HPI Comments: Pt with history of sinusitis, dizziness, who presented to the emergency department with complaints of fever, chills, and headache for 5 days. Patient began having fever and chills approximately one week ago and presented to urgent care today and was sent to the ER for optimal care. No nausea, vomiting, visual complains, seizures, altered mental status, loss of consciousness, or numbness, no gait instability. Pt has had recent swimming, mosquito bites, but no tick bites. + hx of herpes. + cough, no chest pain.    Patient is a 68 y.o. male presenting with fever. The history is provided by the patient and medical records.  Fever Associated symptoms: chills, cough, headaches and myalgias   Associated symptoms: no chest pain, no confusion, no dysuria and no rash     Past Medical History  Diagnosis Date  . HYPERLIPIDEMIA 12/24/2006  . HYPERTENSION 12/24/2006  . ALLERGIC RHINITIS CAUSE UNSPECIFIED 05/27/2010  . GERD 12/24/2006  . DIVERTICULOSIS, COLON 03/09/2007  . Other constipation 11/06/2009  . PROSTATITIS, RECURRENT 12/30/2007  . OSTEOARTHRITIS 12/24/2006  . SHOULDER PAIN, BILATERAL 07/16/2007  . Cervicalgia 05/27/2010  . BACK PAIN, UPPER 02/21/2010  . DIZZINESS, CHRONIC 03/09/2007  . INSOMNIA WITH SLEEP APNEA UNSPECIFIED 05/27/2010  . HEADACHE, SINUS 04/06/2009  . CHEST PAIN UNSPECIFIED 04/25/2009  . Open wound of wrist, complicated 11/07/2583  . ADVEF, DRUG/MEDICINAL/BIOLOGICAL SUBST NOS 03/09/2007  . TRANSIENT ISCHEMIC ATTACKS, HX OF 04/06/2009  . PONV (postoperative nausea and vomiting)    Past Surgical History  Procedure Laterality Date  . Replacement total knee bilateral  2005/2007  . Ankle arthroscopy w/  open repair  2002    Repair  . Tonsillectomy    . Colonoscopy    . Ear cyst excision Bilateral 01/18/2013    Procedure: EXCISION CYSTS FROM BACK (TWO);  Surgeon: Earnstine Regal, MD;  Location[place];  Service: General;  Laterality: Bilateral;   Family History  Problem Relation Age of Onset  . Achalasia Father   . Depression Father   . Heart disease Brother 61  . Kidney failure Mother   . Diabetes Sister    History  Substance Use Topics  . Smoking status: Former Smoker    Quit date: 08/15/1975  . Smokeless tobacco: Not on file     Comment: quit 35 years ago  . Alcohol Use: No    Review of Systems  Constitutional: Positive for fever, chills and fatigue. Negative for activity change.  Eyes: Negative for visual disturbance.  Respiratory: Positive for cough. Negative for chest tightness and shortness of breath.   Cardiovascular: Negative for chest pain.  Gastrointestinal: Negative for abdominal distention.  Genitourinary: Negative for dysuria, enuresis and difficulty urinating.  Musculoskeletal: Positive for myalgias and neck pain. Negative for arthralgias and neck stiffness.  Skin: Negative for rash.  Neurological: Positive for dizziness, weakness and headaches. Negative for light-headedness.  Psychiatric/Behavioral: Negative for confusion.  All other systems reviewed and are negative.     Allergies  Codeine  Home Medications   Prior to Admission medications   Medication Sig Start Date End Date Taking? Authorizing Provider  amitriptyline (ELAVIL) 25 MG tablet Take 25 mg by mouth 2 (two) times daily. Take 25 mg by mouth at 2200 (10PM) and  take 25 mg by mouth at 0000 (12 AM)   Yes Historical Provider, MD  meclizine (ANTIVERT) 12.5 MG tablet Take 25 mg by mouth every 6 (six) hours as needed for dizziness.   Yes Historical Provider, MD  Probiotic Product ([place]) CAPS Take 1 capsule by mouth daily.    Yes Historical Provider, MD  sodium  chloride (OCEAN) 0.65 % SOLN nasal spray Place 2 sprays into both nostrils at bedtime.   Yes Historical Provider, MD  valACYclovir (VALTREX) 500 MG tablet Take 500 mg by mouth daily as needed (for sores).   Yes Historical Provider, MD  acidophilus (RISAQUAD) CAPS capsule Take 2 capsules by mouth daily. 03/06/14   Cherene Altes, MD  aspirin EC 81 MG EC tablet Take 1 tablet (81 mg total) by mouth daily. 03/06/14   Cherene Altes, MD   BP 140/90  Pulse 85  Temp(Src) 98.6 F (37 C) (Oral)  Resp 18  Ht 5\' 8"  (1.727 m)  Wt 199 lb 1.2 oz (90.3 kg)  BMI 30.28 kg/m2  SpO2 94% Physical Exam  Nursing note and vitals reviewed. Constitutional: He is oriented to person, place, and time. He appears well-developed.  HENT:  Head: Normocephalic and atraumatic.  Eyes: Conjunctivae and EOM are normal. Pupils are equal, round, and reactive to light.  Neck: Normal range of motion. Neck supple.  Cardiovascular: Regular rhythm.   Pulmonary/Chest: Effort normal and breath sounds normal. He has no wheezes.  Abdominal: Soft. Bowel sounds are normal. He exhibits no distension. There is no tenderness. There is no rebound and no guarding.  Neurological: He is alert and oriented to person, place, and time. No cranial nerve deficit.  Skin: Skin is warm.    ED Course  Procedures (including critical care time) Labs Review Labs Reviewed  CBC WITH DIFFERENTIAL - Abnormal; Notable for the following:    WBC 26.4 (*)    Neutrophils Relative % 87 (*)    Lymphocytes Relative 8 (*)    Neutro Abs 23.0 (*)    Monocytes Absolute 1.3 (*)    All other components within normal limits  BASIC METABOLIC PANEL - Abnormal; Notable for the following:    Sodium 126 (*)    Chloride 87 (*)    Glucose, Bld 156 (*)    GFR calc non Af Amer 61 (*)    GFR calc Af Amer 70 (*)    Anion gap 16 (*)    All other components within normal limits  URINALYSIS, ROUTINE W REFLEX MICROSCOPIC - Abnormal; Notable for the following:    Hgb  urine dipstick MODERATE (*)    Ketones, ur 15 (*)    Protein, ur 100 (*)    All other components within normal limits  C-REACTIVE PROTEIN - Abnormal; Notable for the following:    CRP 41.3 (*)    All other components within normal limits  URINE MICROSCOPIC-ADD ON - Abnormal; Notable for the following:    Bacteria, UA FEW (*)    All other components within normal limits  SEDIMENTATION RATE - Abnormal; Notable for the following:    Sed Rate 68 (*)    All other components within normal limits  CSF CELL COUNT WITH DIFFERENTIAL - Abnormal; Notable for the following:    RBC Count, CSF 44 (*)    All other components within normal limits  CSF CELL COUNT WITH DIFFERENTIAL - Abnormal; Notable for the following:    RBC Count, CSF 15 (*)    All  other components within normal limits  GLUCOSE, CSF - Abnormal; Notable for the following:    Glucose, CSF 95 (*)    All other components within normal limits  PHOSPHORUS - Abnormal; Notable for the following:    Phosphorus 1.6 (*)    All other components within normal limits  OSMOLALITY, URINE - Abnormal; Notable for the following:    Osmolality, Ur 309 (*)    All other components within normal limits  OSMOLALITY - Abnormal; Notable for the following:    Osmolality 269 (*)    All other components within normal limits  COMPREHENSIVE METABOLIC PANEL - Abnormal; Notable for the following:    Sodium 130 (*)    Glucose, Bld 116 (*)    Total Protein 5.9 (*)    Albumin 2.2 (*)    GFR calc non Af Amer 71 (*)    GFR calc Af Amer 82 (*)    All other components within normal limits  CBC WITH DIFFERENTIAL - Abnormal; Notable for the following:    WBC 16.7 (*)    RBC 4.19 (*)    HCT 37.6 (*)    Neutrophils Relative % 82 (*)    Neutro Abs 13.7 (*)    Lymphocytes Relative 6 (*)    Monocytes Absolute 1.9 (*)    All other components within normal limits  COMPREHENSIVE METABOLIC PANEL - Abnormal; Notable for the following:    Glucose, Bld 107 (*)    Total  Protein 5.8 (*)    Albumin 2.1 (*)    AST 87 (*)    ALT 60 (*)    GFR calc non Af Amer 84 (*)    All other components within normal limits  CBC - Abnormal; Notable for the following:    WBC 13.8 (*)    RBC 4.10 (*)    Hemoglobin 12.7 (*)    HCT 36.1 (*)    All other components within normal limits  CBC - Abnormal; Notable for the following:    WBC 23.6 (*)    RBC 3.95 (*)    Hemoglobin 12.2 (*)    HCT 35.3 (*)    All other components within normal limits  COMPREHENSIVE METABOLIC PANEL - Abnormal; Notable for the following:    Sodium 136 (*)    Glucose, Bld 243 (*)    Total Protein 5.6 (*)    Albumin 2.2 (*)    AST 117 (*)    ALT 156 (*)    Total Bilirubin 0.2 (*)    GFR calc non Af Amer 89 (*)    All other components within normal limits  CBC - Abnormal; Notable for the following:    WBC 23.7 (*)    RBC 4.15 (*)    HCT 37.5 (*)    All other components within normal limits  HEMOGLOBIN A1C - Abnormal; Notable for the following:    Hemoglobin A1C 6.5 (*)    Mean Plasma Glucose 140 (*)    All other components within normal limits  COMPREHENSIVE METABOLIC PANEL - Abnormal; Notable for the following:    Sodium 136 (*)    Total Protein 5.8 (*)    Albumin 2.4 (*)    AST 65 (*)    ALT 129 (*)    GFR calc non Af Amer 87 (*)    All other components within normal limits  COMPREHENSIVE METABOLIC PANEL - Abnormal; Notable for the following:    Sodium 133 (*)    Glucose, Bld 101 (*)  Albumin 2.5 (*)    AST 46 (*)    ALT 91 (*)    GFR calc non Af Amer 85 (*)    All other components within normal limits  CBC - Abnormal; Notable for the following:    WBC 24.2 (*)    All other components within normal limits  CULTURE, BLOOD (ROUTINE X 2)  CULTURE, BLOOD (ROUTINE X 2)  URINE CULTURE  STOOL CULTURE  CLOSTRIDIUM DIFFICILE BY PCR  CSF CULTURE  GRAM STAIN  CULTURE, EXPECTORATED SPUTUM-ASSESSMENT  GRAM STAIN  RESPIRATORY VIRUS PANEL  MRSA PCR SCREENING  VIRAL CULTURE VIRC   H.PYLORI ANTIGEN, STOOL  CULTURE, RESPIRATORY (NON-EXPECTORATED)  PROTEIN, CSF  HERPES SIMPLEX VIRUS(HSV) DNA BY PCR  CRYPTOCOCCAL ANTIGEN, CSF  VDRL, CSF  ROCKY MTN SPOTTED FVR AB, IGM-BLOOD  B. BURGDORFI ANTIBODIES, CSF  WEST NILE AB, IGG AND IGM, CSF  HIV ANTIBODY (ROUTINE TESTING)  LEGIONELLA ANTIGEN, URINE  STREP PNEUMONIAE URINARY ANTIGEN  MAGNESIUM  TSH  CREATININE, URINE, RANDOM  SODIUM, URINE, RANDOM  MISCELLANEOUS TEST  INFLUENZA PANEL BY PCR (TYPE A & B, H1N1)  MAGNESIUM  PHOSPHORUS  H. PYLORI ANTIBODY, IGG  HEPATITIS PANEL, ACUTE  PROCALCITONIN  PROCALCITONIN  I-STAT CG4 LACTIC ACID, ED    Imaging Review No results found.   EKG Interpretation None      MDM   Final diagnoses:  Hyponatremia  SIRS (systemic inflammatory response syndrome)  Sepsis Community acquired pneumonia  PLEASE SEE THE SEPARATE LUMBAR PUNCTURE NOTE, PERFORMED BY RESIDENT PHYSICIAN, DR. Lucrezia Starch.   Pt comes in with weakness, myalgias, chills, fevers. He is tachycardic. Headaches for several days. Hx of herpes. Though bacterial meningeal process not favored, viral process, and encephalopathy possible due to mosquito bites and hx of herpes. LP performed.  Medicine to admit. Empiric antibiotics started.    Varney Biles, MD 03/30/14 [phone]

## 2014-04-10 ENCOUNTER — Ambulatory Visit (INDEPENDENT_AMBULATORY_CARE_PROVIDER_SITE_OTHER): Payer: Medicare Other | Admitting: Family Medicine

## 2014-04-10 ENCOUNTER — Encounter: Payer: Self-pay | Admitting: Family Medicine

## 2014-04-10 VITALS — BP 150/92 | HR 64 | Temp 98.0°F | Wt 202.0 lb

## 2014-04-10 DIAGNOSIS — R7309 Other abnormal glucose: Secondary | ICD-10-CM

## 2014-04-10 DIAGNOSIS — R7989 Other specified abnormal findings of blood chemistry: Secondary | ICD-10-CM | POA: Diagnosis not present

## 2014-04-10 DIAGNOSIS — B0230 Zoster ocular disease, unspecified: Secondary | ICD-10-CM | POA: Insufficient documentation

## 2014-04-10 DIAGNOSIS — Z23 Encounter for immunization: Secondary | ICD-10-CM

## 2014-04-10 DIAGNOSIS — R251 Tremor, unspecified: Secondary | ICD-10-CM

## 2014-04-10 DIAGNOSIS — R911 Solitary pulmonary nodule: Secondary | ICD-10-CM | POA: Insufficient documentation

## 2014-04-10 DIAGNOSIS — K5909 Other constipation: Secondary | ICD-10-CM

## 2014-04-10 DIAGNOSIS — K219 Gastro-esophageal reflux disease without esophagitis: Secondary | ICD-10-CM | POA: Diagnosis not present

## 2014-04-10 DIAGNOSIS — R739 Hyperglycemia, unspecified: Secondary | ICD-10-CM

## 2014-04-10 DIAGNOSIS — D72829 Elevated white blood cell count, unspecified: Secondary | ICD-10-CM | POA: Diagnosis not present

## 2014-04-10 DIAGNOSIS — J189 Pneumonia, unspecified organism: Secondary | ICD-10-CM

## 2014-04-10 DIAGNOSIS — R945 Abnormal results of liver function studies: Secondary | ICD-10-CM

## 2014-04-10 DIAGNOSIS — I10 Essential (primary) hypertension: Secondary | ICD-10-CM | POA: Diagnosis not present

## 2014-04-10 LAB — CBC WITH DIFFERENTIAL/PLATELET
BASOS ABS: 0 10*3/uL (ref 0.0–0.1)
Basophils Relative: 0.3 % (ref 0.0–3.0)
EOS ABS: 0.1 10*3/uL (ref 0.0–0.7)
Eosinophils Relative: 0.5 % (ref 0.0–5.0)
HCT: 45.4 % (ref 39.0–52.0)
HEMOGLOBIN: 15 g/dL (ref 13.0–17.0)
LYMPHS ABS: 3.4 10*3/uL (ref 0.7–4.0)
Lymphocytes Relative: 21.6 % (ref 12.0–46.0)
MCHC: 33.2 g/dL (ref 30.0–36.0)
MCV: 93.4 fl (ref 78.0–100.0)
MONO ABS: 1.2 10*3/uL — AB (ref 0.1–1.0)
Monocytes Relative: 7.4 % (ref 3.0–12.0)
NEUTROS ABS: 11 10*3/uL — AB (ref 1.4–7.7)
Neutrophils Relative %: 70.2 % (ref 43.0–77.0)
Platelets: 228 10*3/uL (ref 150.0–400.0)
RBC: 4.86 Mil/uL (ref 4.22–5.81)
RDW: 14.3 % (ref 11.5–15.5)
WBC: 15.7 10*3/uL — ABNORMAL HIGH (ref 4.0–10.5)

## 2014-04-10 LAB — COMPREHENSIVE METABOLIC PANEL
ALK PHOS: 66 U/L (ref 39–117)
ALT: 16 U/L (ref 0–53)
AST: 19 U/L (ref 0–37)
Albumin: 3.6 g/dL (ref 3.5–5.2)
BILIRUBIN TOTAL: 0.4 mg/dL (ref 0.2–1.2)
BUN: 13 mg/dL (ref 6–23)
CO2: 30 mEq/L (ref 19–32)
CREATININE: 1.4 mg/dL (ref 0.4–1.5)
Calcium: 9.8 mg/dL (ref 8.4–10.5)
Chloride: 101 mEq/L (ref 96–112)
GFR: 54.61 mL/min — ABNORMAL LOW (ref >60.00)
Glucose, Bld: 109 mg/dL — ABNORMAL HIGH (ref 70–99)
Potassium: 4.5 mEq/L (ref 3.5–5.1)
Sodium: 139 mEq/L (ref 135–145)
Total Protein: 7.3 g/dL (ref 6.0–8.3)

## 2014-04-10 LAB — HEMOGLOBIN A1C: Hgb A1c MFr Bld: 5.9 % (ref 4.6–6.5)

## 2014-04-10 MED ORDER — OMEPRAZOLE 20 MG PO CPDR: 20.0000 mg | DELAYED_RELEASE_CAPSULE | Freq: Every day | ORAL | Status: DC

## 2014-04-10 NOTE — Progress Notes (Signed)
Charles Reddish, MD Phone: [phone]  Subjective:  Patient presents today to establish care with me as their new primary care provider. Patient was formerly a patient of Dr. Arnoldo Morale. Chief complaint-noted.   Hospital follow up for pneumonia Treated in hospital for 8 days including 7 in step down unit days. Treated with ceftin and doxycycline and finished 14 day course.  Did have an event one night where heart rate went into the 30s with concern for sleep apnea as concern. LUL pulmonolary nodule with planned 6 month follow up.   Today patient admits that he finished course of antibiotics. He has overall felt well since getting out of hospital but is only slowly getting back to his normal activity level. He can walk 3/4 to 1 mile now which is an improvement.   ROS-no current chest pain or shortness of breath  Tremor X 15 years, worse over last 2-3. After hospitalization, worsened further. States his daughter has similar symptoms though not as severe  Elevated a1c noted in hospital Multiple steroid injections- 4 steroid shots over last year as well as 4 in neck. No confirmatory testing to date  Health Maintenance Due  Topic Date Due  . Zostavax  03/01/2007  . Pneumococcal Polysaccharide Vaccine Age 50 And Over  02/29/2012       The following were reviewed and entered/updated in epic: Past Medical History  Diagnosis Date  . HYPERLIPIDEMIA 12/24/2006  . HYPERTENSION 12/24/2006  . ALLERGIC RHINITIS CAUSE UNSPECIFIED 05/27/2010  . GERD 12/24/2006  . DIVERTICULOSIS, COLON 03/09/2007  . Other constipation 11/06/2009  . PROSTATITIS, RECURRENT 12/30/2007  . OSTEOARTHRITIS 12/24/2006  . SHOULDER PAIN, BILATERAL 07/16/2007  . Cervicalgia 05/27/2010  . BACK PAIN, UPPER 02/21/2010  . DIZZINESS, CHRONIC 03/09/2007  . INSOMNIA WITH SLEEP APNEA UNSPECIFIED 05/27/2010  . HEADACHE, SINUS 04/06/2009  . CHEST PAIN UNSPECIFIED 04/25/2009  . Open wound of wrist, complicated 02/06/9832  . TRANSIENT  ISCHEMIC ATTACKS, HX OF 04/06/2009  . PONV (postoperative nausea and vomiting)    Patient Active Problem List   Diagnosis Date Noted  . 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 Tremor 04/10/2014    Priority: Medium  . Elevated hemoglobin A1c 04/10/2014    Priority: Medium  . Solitary pulmonary nodule 04/10/2014    Priority: Medium  . Insomnia with sleep apnea 05/27/2010    Priority: Medium  . Hyperlipidemia 12/24/2006    Priority: Medium  . Essential hypertension 12/24/2006    Priority: Medium  . Constipation, slow transit 08/14/2010    Priority: Low  . Allergic rhinitis 05/27/2010    Priority: Low  . Cervicalgia 05/27/2010    Priority: Low  . HEADACHE, SINUS 04/06/2009    Priority: Low  . SHOULDER PAIN, BILATERAL 07/16/2007    Priority: Low  . DIZZINESS, CHRONIC 03/09/2007    Priority: Low  . GERD 12/24/2006    Priority: Low  . Osteoarthritis 12/24/2006    Priority: Low   Past Surgical History  Procedure Laterality Date  . Replacement total knee bilateral  2005/2007  . Ankle arthroscopy w/ open repair  2002    Repair  . Tonsillectomy    . Colonoscopy    . Ear cyst excision Bilateral 01/18/2013    Procedure: EXCISION CYSTS FROM BACK (TWO);  Surgeon: Earnstine Regal, MD;  Location: [facility];  Service: General;  Laterality: Bilateral;  . Cyst excision      from back 2015. likely this was meaning of ear cyst  . Epidydmal  cyst excision      Family History  Problem Relation Age of Onset  . Achalasia Father   . Depression Father   . Heart disease Brother 59    heavy smoker-died of MI  . Kidney failure Mother   . Diabetes Sister     Medications- reviewed and updated Current Outpatient Prescriptions  Medication Sig Dispense Refill  . acidophilus (RISAQUAD) CAPS capsule Take 2 capsules by mouth daily.  14 capsule  0  . amitriptyline (ELAVIL) 25 MG tablet Take 25 mg by mouth 2 (two) times daily. Take 25 mg by mouth at 2200 (10PM) and take 25  mg by mouth at 0000 (12 AM)      . aspirin EC 81 MG EC tablet Take 1 tablet (81 mg total) by mouth daily.      . sodium chloride (OCEAN) 0.65 % SOLN nasal spray Place 2 sprays into both nostrils at bedtime.      . meclizine (ANTIVERT) 12.5 MG tablet Take 25 mg by mouth every 6 (six) hours as needed for dizziness.      Marland Kitchen omeprazole (PRILOSEC) 20 MG capsule Take 1 capsule (20 mg total) by mouth daily.  30 capsule  11  . Probiotic Product ([place]) CAPS Take 1 capsule by mouth daily.       . valACYclovir (VALTREX) 500 MG tablet Take 500 mg by mouth daily as needed (for sores).       No current facility-administered medications for this visit.    Allergies-reviewed and updated Allergies  Allergen Reactions  . Codeine Other (See Comments)    REACTION: Nausea    History   Social History  . Marital Status: Married    Spouse Name: N/A    Number of Children: N/A  . Years of Education: N/A   Occupational History  . Retired     Disabled   Social History Main Topics  . Smoking status: Former Smoker -- 0.10 packs/day for 3 years    Types: Cigarettes    Quit date: 08/15/1975  . Smokeless tobacco: None     Comment: quit 35 years ago  . Alcohol Use: No  . Drug Use: No  . Sexual Activity: Yes   Other Topics Concern  . None   Social History Narrative   Married 1967. 1 girl. 2 grandkids.       Retired Conservation officer, historic buildings      Hobbies: Photographer, lake, boat             ROS--See HPI   Objective: BP 150/92  Pulse 64  Temp(Src) 98 F (36.7 C)  Wt 202 lb (91.627 kg) Gen: NAD, resting comfortably in chair HEENT: Mucous membranes are moist. Oropharynx normal Neck: no thyromegaly CV: RRR no murmurs rubs or gallops Lungs: CTAB no crackles, wheeze, rhonchi Abdomen: soft/nontender/nondistended/normal bowel sounds.   Ext: no edema, 2+ PT pulses Skin: warm, dry, no rash Neuro: grossly normal, moves all extremities, PERRLA   Assessment/Plan:  Hospital  follow up-pneumonia resolved 5 month from now f/u for CT and sleep apnea study.  Recheck WBC given WBC elevation at time of hospital discharge.  Recheck LFTs given LFT elevation at time of hospital discharge  Tremor Bilateral hands. I cannot clearly call exam cogwheel rigidity but motion is somewhat abnormal. Due to this and patient desire for further evaluation, sent for referral to neurology. Discussed likely just essential tremor and would be willing to treat this but would want diagnosis made under care of  neurology.   Elevated hemoglobin A1c At 6.5 meets criteria for DM but want confirmatory testing so repeat a1c today.   Essential hypertension S: previously on benicar before lost weight. ROS-no chest pain or shortness of breath O: BP elevated at 150/92 A/P: start DASH diet and follow up 3 months. Continue to increase exercise as able. Consider restart benicar next visit.    Orders Placed This Encounter  Procedures  . CBC with Differential  . Comprehensive metabolic panel    [place]  . Hemoglobin A1c    [place]  . Ambulatory referral to Neurology    Referral Priority:  Routine    Referral Type:  Consultation    Referral Reason:  Specialty Services Required    Requested Specialty:  Neurology    Number of Visits Requested:  1    Meds ordered this encounter  Medications  . omeprazole (PRILOSEC) 20 MG capsule    Sig: Take 1 capsule (20 mg total) by mouth daily.    Dispense:  30 capsule    Refill:  11

## 2014-04-10 NOTE — Assessment & Plan Note (Signed)
S: previously on benicar before lost weight. ROS-no chest pain or shortness of breath O: BP elevated at 150/92 A/P: start DASH diet and follow up 3 months. Continue to increase exercise as able. Consider restart benicar next visit.

## 2014-04-10 NOTE — Assessment & Plan Note (Signed)
Bilateral hands. I cannot clearly call exam cogwheel rigidity but motion is somewhat abnormal. Due to this and patient desire for further evaluation, sent for referral to neurology. Discussed likely just essential tremor and would be willing to treat this but would want diagnosis made under care of neurology.

## 2014-04-10 NOTE — Assessment & Plan Note (Signed)
At 6.5 meets criteria for DM but want confirmatory testing so repeat a1c today.

## 2014-04-10 NOTE — Patient Instructions (Addendum)
Check labs today.  Make sure white cells are down Make sure liver function tests improved Confirm diagnosis of diabetes or not-may be prediabetes. If this is positive, will send you to diabetes education.   Flu shot today, pneumonia shot at least 6 months out from illness  Since blood pressure is up  Work on dash eating plan below  Try to walk daily if you can except on the bad days regarding rain.   See me in 3-4 months for repeat  See me sooner if it is elevated at home above 150 or 90 or at outside doctor offices  [place] stands for "Dietary Approaches to Stop Hypertension." The DASH eating plan is a healthy eating plan that has been shown to reduce high blood pressure (hypertension). Additional health benefits may include reducing the risk of type 2 diabetes mellitus, heart disease, and stroke. The DASH eating plan may also help with weight loss. WHAT DO I NEED TO KNOW ABOUT THE DASH EATING PLAN? For the DASH eating plan, you will follow these general guidelines:  Choose foods with a percent daily value for sodium of less than 5% (as listed on the food label).  Use salt-free seasonings or herbs instead of table salt or sea salt.  Check with your health care provider or pharmacist before using salt substitutes.  Eat lower-sodium products, often labeled as "lower sodium" or "no salt added."  Eat fresh foods.  Eat more vegetables, fruits, and low-fat dairy products.  Choose whole grains. Look for the word "whole" as the first word in the ingredient list.  Choose fish and skinless chicken or [place] more often than red meat. Limit fish, poultry, and meat to 6 oz (170 g) each day.  Limit sweets, desserts, sugars, and sugary drinks.  Choose heart-healthy fats.  Limit cheese to 1 oz (28 g) per day.  Eat more home-cooked food and less restaurant, buffet, and fast food.  Limit fried foods.  Cook foods using methods other than frying.  Limit canned vegetables. If  you do use them, rinse them well to decrease the sodium.  When eating at a restaurant, ask that your food be prepared with less salt, or no salt if possible. WHAT FOODS CAN I EAT? Seek help from a dietitian for individual calorie needs. Grains Whole grain or whole wheat bread. Brown rice. Whole grain or whole wheat pasta. Quinoa, bulgur, and whole grain cereals. Low-sodium cereals. Corn or whole wheat flour tortillas. Whole grain cornbread. Whole grain crackers. Low-sodium crackers. Vegetables Fresh or frozen vegetables (raw, steamed, roasted, or grilled). Low-sodium or reduced-sodium tomato and vegetable juices. Low-sodium or reduced-sodium tomato sauce and paste. Low-sodium or reduced-sodium canned vegetables.  Fruits All fresh, canned (in natural juice), or frozen fruits. Meat and Other Protein Products Ground beef (85% or leaner), grass-fed beef, or beef trimmed of fat. Skinless chicken or [place]. Ground chicken or [place]. Pork trimmed of fat. All fish and seafood. Eggs. Dried beans, peas, or lentils. Unsalted nuts and seeds. Unsalted canned beans. Dairy Low-fat dairy products, such as skim or 1% milk, 2% or reduced-fat cheeses, low-fat ricotta or cottage cheese, or plain low-fat yogurt. Low-sodium or reduced-sodium cheeses. Fats and Oils Tub margarines without trans fats. Light or reduced-fat mayonnaise and salad dressings (reduced sodium). Avocado. Safflower, olive, or canola oils. Natural peanut or almond butter. Other Unsalted popcorn and pretzels. The items listed above may not be a complete list of recommended foods or beverages. Contact your dietitian for more options. WHAT  FOODS ARE NOT RECOMMENDED? Grains White bread. White pasta. White rice. Refined cornbread. Bagels and croissants. Crackers that contain trans fat. Vegetables Creamed or fried vegetables. Vegetables in a cheese sauce. Regular canned vegetables. Regular canned tomato sauce and paste. Regular tomato and vegetable  juices. Fruits Dried fruits. Canned fruit in light or heavy syrup. Fruit juice. Meat and Other Protein Products Fatty cuts of meat. Ribs, chicken wings, bacon, sausage, bologna, salami, chitterlings, fatback, hot dogs, bratwurst, and packaged luncheon meats. Salted nuts and seeds. Canned beans with salt. Dairy Whole or 2% milk, cream, half-and-half, and cream cheese. Whole-fat or sweetened yogurt. Full-fat cheeses or blue cheese. Nondairy creamers and whipped toppings. Processed cheese, cheese spreads, or cheese curds. Condiments Onion and garlic salt, seasoned salt, table salt, and sea salt. Canned and packaged gravies. Worcestershire sauce. Tartar sauce. Barbecue sauce. Teriyaki sauce. Soy sauce, including reduced sodium. Steak sauce. Fish sauce. Oyster sauce. Cocktail sauce. Horseradish. Ketchup and mustard. Meat flavorings and tenderizers. Bouillon cubes. Hot sauce. Tabasco sauce. Marinades. Taco seasonings. Relishes. Fats and Oils Butter, stick margarine, lard, shortening, ghee, and bacon fat. Coconut, palm kernel, or palm oils. Regular salad dressings. Other Pickles and olives. Salted popcorn and pretzels. The items listed above may not be a complete list of foods and beverages to avoid. Contact your dietitian for more information. WHERE CAN I FIND MORE INFORMATION? National Heart, Lung, and Blood Institute: travelstabloid.com Document Released: 06/05/2011 Document Revised: 10/31/2013 Document Reviewed: 04/20/2013 [facility] Patient Information 2015 [place], [place]. This information is not intended to replace advice given to you by your health care provider. Make sure you discuss any questions you have with your health care provider.

## 2014-04-12 DIAGNOSIS — L570 Actinic keratosis: Secondary | ICD-10-CM | POA: Diagnosis not present

## 2014-04-12 DIAGNOSIS — D239 Other benign neoplasm of skin, unspecified: Secondary | ICD-10-CM | POA: Diagnosis not present

## 2014-04-13 ENCOUNTER — Encounter: Payer: Self-pay | Admitting: Neurology

## 2014-04-13 ENCOUNTER — Ambulatory Visit (INDEPENDENT_AMBULATORY_CARE_PROVIDER_SITE_OTHER): Payer: Medicare Other | Admitting: Neurology

## 2014-04-13 VITALS — BP 126/84 | HR 76 | Ht 68.0 in | Wt 200.0 lb

## 2014-04-13 DIAGNOSIS — N289 Disorder of kidney and ureter, unspecified: Secondary | ICD-10-CM | POA: Diagnosis not present

## 2014-04-13 DIAGNOSIS — D72829 Elevated white blood cell count, unspecified: Secondary | ICD-10-CM | POA: Diagnosis not present

## 2014-04-13 DIAGNOSIS — R911 Solitary pulmonary nodule: Secondary | ICD-10-CM

## 2014-04-13 DIAGNOSIS — G250 Essential tremor: Secondary | ICD-10-CM | POA: Diagnosis not present

## 2014-04-13 DIAGNOSIS — IMO0001 Reserved for inherently not codable concepts without codable children: Secondary | ICD-10-CM

## 2014-04-13 MED ORDER — PRIMIDONE 50 MG PO TABS
50.0000 mg | ORAL_TABLET | Freq: Every day | ORAL | Status: DC
Start: 2014-04-13 — End: 2018-05-07

## 2014-04-13 NOTE — Patient Instructions (Signed)
1. Start Primidone 50 mg - take 1/2 tablet for 4 nights then increase to one tablet nightly.

## 2014-04-13 NOTE — Progress Notes (Signed)
Jermone Ethelda Chick was seen today in the movement disorders clinic for neurologic consultation at the request of [name], MD.  The consultation is for the evaluation of tremor.  Pt reports tremor has been in bilateral hands for 20 years but it was mostly associated with hunger and weakness.  It was started to get worse over the last few years but he was recently in the hospital for PNA and he noted that he couldn't even grab the pills in the hospital.  He has most trouble with fine motor movement. He notes just a fine tremor at rest.  His daughter, who is in her 46's, has a tremor, but it is not as pronounced as his.  Specific Symptoms:  Tremor: Yes.    Affected by caffeine:  No.  Affected by alcohol:  Doesn't drink  Affected by stress:  Unknown but worse in social situations  Affected by fatigue:  Yes.    Spills soup if on spoon:  No.  Spills glass of liquid if full:  No.  Affects ADL's (tying shoes, brushing teeth, etc):  No.   Voice: only if working excessively Sleep: takes elavil for sleep x 25 years  Vivid Dreams:  No.  Acting out dreams:  No. Wet Pillows: No. Postural symptoms:  No.  Falls?  No. Bradykinesia symptoms: no bradykinesia noted Loss of smell:  No. Loss of taste:  No. Urinary Incontinence:  No. Difficulty Swallowing:  No. Handwriting, micrographia: No. (but is shaky because of tremor) Trouble with ADL's:  No.  Trouble buttoning clothing: No. Depression:  No. Memory changes:  No. Hallucinations:  No.  visual distortions: No. N/V:  No. Lightheaded:  No.  Syncope: No. Diplopia:  No. Dyskinesia:  No.    PREVIOUS MEDICATIONS: none to date  ALLERGIES:   Allergies  Allergen Reactions  . Codeine Other (See Comments)    REACTION: Nausea    CURRENT MEDICATIONS:  Outpatient Encounter Prescriptions as of 04/13/2014  Medication Sig  . acidophilus (RISAQUAD) CAPS capsule Take 2 capsules by mouth daily.  Marland Kitchen amitriptyline (ELAVIL) 25 MG tablet Take 25 mg  by mouth 2 (two) times daily. Take 25 mg by mouth at 2200 (10PM) and take 25 mg by mouth at 0000 (12 AM)  . aspirin EC 81 MG EC tablet Take 1 tablet (81 mg total) by mouth daily.  . meclizine (ANTIVERT) 12.5 MG tablet Take 25 mg by mouth every 6 (six) hours as needed for dizziness.  Marland Kitchen omeprazole (PRILOSEC) 20 MG capsule Take 1 capsule (20 mg total) by mouth daily.  . Probiotic Product ([place]) CAPS Take 1 capsule by mouth daily.   . sodium chloride (OCEAN) 0.65 % SOLN nasal spray Place 2 sprays into both nostrils at bedtime.  . traMADol (ULTRAM) 50 MG tablet Take 50 mg by mouth as needed. For neck pain  . valACYclovir (VALTREX) 500 MG tablet Take 500 mg by mouth daily as needed (for sores).    PAST MEDICAL HISTORY:   Past Medical History  Diagnosis Date  . HYPERLIPIDEMIA 12/24/2006    diet controlled  . HYPERTENSION 12/24/2006    better after weight loss  . ALLERGIC RHINITIS CAUSE UNSPECIFIED 05/27/2010  . GERD 12/24/2006  . DIVERTICULOSIS, COLON 03/09/2007  . Other constipation 11/06/2009  . PROSTATITIS, RECURRENT 12/30/2007  . OSTEOARTHRITIS 12/24/2006  . SHOULDER PAIN, BILATERAL 07/16/2007  . Cervicalgia 05/27/2010  . BACK PAIN, UPPER 02/21/2010  . DIZZINESS, CHRONIC 03/09/2007  . INSOMNIA WITH SLEEP APNEA UNSPECIFIED 05/27/2010  .  HEADACHE, SINUS 04/06/2009  . CHEST PAIN UNSPECIFIED 04/25/2009  . Open wound of wrist, complicated 02/06/1750  . TRANSIENT ISCHEMIC ATTACKS, HX OF 04/06/2009  . PONV (postoperative nausea and vomiting)     PAST SURGICAL HISTORY:   Past Surgical History  Procedure Laterality Date  . Replacement total knee bilateral  2005/2007  . Ankle arthroscopy w/ open repair  2002    Repair  . Tonsillectomy    . Colonoscopy    . Ear cyst excision Bilateral 01/18/2013    Procedure: EXCISION CYSTS FROM BACK (TWO);  Surgeon: Earnstine Regal, MD;  Location: [facility];  Service: General;  Laterality: Bilateral;  . Cyst excision      from back  2015. likely this was meaning of ear cyst  . Epidydmal cyst excision      SOCIAL HISTORY:   History   Social History  . Marital Status: Married    Spouse Name: N/A    Number of Children: N/A  . Years of Education: N/A   Occupational History  . Retired     Tourist information centre manager, Librarian, academic [phone])   Social History Main Topics  . Smoking status: Former Smoker -- 0.10 packs/day for 3 years    Types: Cigarettes    Quit date: 08/15/1975  . Smokeless tobacco: Not on file     Comment: quit 35 years ago  . Alcohol Use: No  . Drug Use: No  . Sexual Activity: Yes   Other Topics Concern  . Not on file   Social History Narrative   Married 1967. 1 girl. 2 grandkids.       Retired Conservation officer, historic buildings      Hobbies: Photographer, lake, boat             FAMILY HISTORY:   Family Status  Relation Status Death Age  . Father Deceased     Suicide  . Brother Deceased     MI  . Other    . Mother Deceased     Renal failure  . Sister Alive     diabetes  . Sister Alive     unknown  . Daughter Alive     healthy    ROS:  A complete 10 system review of systems was obtained and was unremarkable apart from what is mentioned above.  PHYSICAL EXAMINATION:    VITALS:   Filed Vitals:   04/13/14 0905  BP: 126/84  Pulse: 76  Height: 5\' 8"  (1.727 m)  Weight: 200 lb (90.719 kg)    GEN:  The patient appears stated age and is in NAD. HEENT:  Normocephalic, atraumatic.  The mucous membranes are moist. The superficial temporal arteries are without ropiness or tenderness. CV:  RRR Lungs:  CTAB Neck/HEME:  There are no carotid bruits bilaterally.  Neurological examination:  Orientation: The patient is alert and oriented x3. Fund of knowledge is appropriate.  Recent and remote memory are intact.  Attention and concentration are normal.    Able to name objects and repeat phrases. Cranial nerves: There is good facial symmetry. Pupils are equal round and reactive to light bilaterally. Fundoscopic  exam reveals clear margins bilaterally. Extraocular muscles are intact. The visual fields are full to confrontational testing. The speech is fluent and clear. Soft palate rises symmetrically and there is no tongue deviation. Hearing is intact to conversational tone. Sensation: Sensation is intact to light and pinprick throughout (facial, trunk, extremities). Vibration is intact at the bilateral big toe. There is no extinction  with double simultaneous stimulation. There is no sensory dermatomal level identified. Motor: Strength is 5/5 in the bilateral upper and lower extremities.   Shoulder shrug is equal and symmetric.  There is no pronator drift. Deep tendon reflexes: Deep tendon reflexes are 2/4 at the bilateral biceps, triceps, brachioradialis, patella and achilles. Plantar responses are downgoing bilaterally.  Movement examination: Tone: There is normal tone on the L.  I believe that there is normal tone in the RUE but he has trouble relaxing (? Paratonia).   Abnormal movements: There is postural tremor bilaterally, L greater than R.  It increases slightly with intention.  There is a rest component to tremor on the L that is mild Coordination:  There is no significant decremation with RAM's, Gait and Station: The patient has no difficulty arising out of a deep-seated chair without the use of the hands. The patient's stride length is normal with normal arm swing.  The patient has a negative pull test.      Labs:  Lab Results  Component Value Date   TSH 1.690 02/27/2014     Chemistry      Component Value Date/Time   NA 139 04/10/2014 1543   K 4.5 04/10/2014 1543   CL 101 04/10/2014 1543   CO2 30 04/10/2014 1543   BUN 13 04/10/2014 1543   CREATININE 1.4 04/10/2014 1543      Component Value Date/Time   CALCIUM 9.8 04/10/2014 1543   ALKPHOS 66 04/10/2014 1543   AST 19 04/10/2014 1543   ALT 16 04/10/2014 1543   BILITOT 0.4 04/10/2014 1543     Lab Results  Component Value Date   WBC  15.7* 04/10/2014   HGB 15.0 04/10/2014   HCT 45.4 04/10/2014   MCV 93.4 04/10/2014   PLT 228.0 04/10/2014     ASSESSMENT/PLAN:  1.  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  Not particularly worried about the resting component, as this often happens with the progression of more significant essential tremor, but there may be a component of increased tone but it is difficult to tell as the patient has trouble relaxing.  This may be all paratonia.  I think I would like to see his lab work (renal function, white blood cells) normalize and give [place] a little bit more time to see him before giving him a definitive diagnosis (as ET/PD is in the ddx).  In the meantime, we talked about treatment and he would like to try primidone.  His liver enzymes have now normalized and I think primidone is reasonable.  Risks, benefits, side effects and alternative therapies were discussed.  The opportunity to ask questions was given and they were answered to the best of my ability.  The patient expressed understanding and willingness to follow the outlined treatment protocols. 2.  renal insufficiency.  -This has actually gotten worse since his hospital stay.  His primary care is monitoring this closely. 3.  Leukocytosis.  -This is resolving, but has not completely resolved it after his hospital stay for pneumonia.  Again, his primary care is monitoring this. 4.  lung nodule.  -This was found during his hospital stay.  It was recommended that this be followed up in 6 months.  Patient reports that this is being followed by primary care. 5.  I will plan on seeing him back in the next few months, sooner should new neurologic issues arise.  Because his neurological examination was fairly nonfocal and nonlateralizing, we decided not to do any repeat neuroimaging (and because the tremor had been going on for 20 years, but recently  worsened) 6.  Visit time, 60 min with greater than 50% in counseling and coordinating care.  Pt education given

## 2014-04-20 ENCOUNTER — Ambulatory Visit: Payer: Medicare Other | Admitting: Neurology

## 2014-05-04 DIAGNOSIS — M5417 Radiculopathy, lumbosacral region: Secondary | ICD-10-CM | POA: Diagnosis not present

## 2014-05-04 DIAGNOSIS — M542 Cervicalgia: Secondary | ICD-10-CM | POA: Diagnosis not present

## 2014-05-04 DIAGNOSIS — M47812 Spondylosis without myelopathy or radiculopathy, cervical region: Secondary | ICD-10-CM | POA: Diagnosis not present

## 2014-06-27 ENCOUNTER — Telehealth: Payer: Self-pay | Admitting: Neurology

## 2014-06-27 NOTE — Telephone Encounter (Signed)
Pt called to cancel his f/u appt for 07/14/14. Pt is moving out of town.

## 2014-07-05 ENCOUNTER — Encounter: Payer: Self-pay | Admitting: Family Medicine

## 2014-07-05 ENCOUNTER — Ambulatory Visit (INDEPENDENT_AMBULATORY_CARE_PROVIDER_SITE_OTHER): Payer: Medicare Other | Admitting: Family Medicine

## 2014-07-05 VITALS — BP 140/92 | Temp 98.5°F | Wt 204.0 lb

## 2014-07-05 DIAGNOSIS — M545 Low back pain, unspecified: Secondary | ICD-10-CM

## 2014-07-05 DIAGNOSIS — M791 Myalgia, unspecified site: Secondary | ICD-10-CM

## 2014-07-05 DIAGNOSIS — M792 Neuralgia and neuritis, unspecified: Secondary | ICD-10-CM | POA: Diagnosis not present

## 2014-07-05 DIAGNOSIS — I10 Essential (primary) hypertension: Secondary | ICD-10-CM

## 2014-07-05 NOTE — Patient Instructions (Signed)
Trigger point injection completed today. Should kick in within 24 hours. If within a few days no improvement, give [place] a call and we can try flexeril.   Also checked kidney function given increase on last testing. Continue to avoid nsaids such as ibuprofen/aleve

## 2014-07-05 NOTE — Progress Notes (Signed)
Charles Reddish, MD Phone: [phone]  Subjective:   [name] is a 69 y.o. year old very pleasant male patient who presents with the following:  Left low back pain started with changing tire 4 weeks ago. Started in left low back. Feels like a spasm. Up to 9/10. Pushes on it and lifts leg out and pain resolves within a few minutes. 3-4x a day. Stable in course. Worse with prolonged sitting. There is a particular spot that he pushes on and it hurts and causes the spasm to worsen. This has occurred in the past and resolved with steroid injection. He has tried flexeril before with little relief.   ROS-No saddle anesthesia, bladder incontinence, fecal incontinence, weakness in extremity, numbness or tingling in extremity. History negative for trauma, history of cancer, fever, chills, unintentional weight loss.   Past Medical History- Patient Active Problem List   Diagnosis Date Noted  . 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 Tremor 04/10/2014    Priority: Medium  . Elevated hemoglobin A1c 04/10/2014    Priority: Medium  . Solitary pulmonary nodule 04/10/2014    Priority: Medium  . Insomnia with sleep apnea 05/27/2010    Priority: Medium  . Hyperlipidemia 12/24/2006    Priority: Medium  . Essential hypertension 12/24/2006    Priority: Medium  . Constipation, slow transit 08/14/2010    Priority: Low  . Allergic rhinitis 05/27/2010    Priority: Low  . Cervicalgia 05/27/2010    Priority: Low  . HEADACHE, SINUS 04/06/2009    Priority: Low  . SHOULDER PAIN, BILATERAL 07/16/2007    Priority: Low  . DIZZINESS, CHRONIC 03/09/2007    Priority: Low  . GERD 12/24/2006    Priority: Low  . Osteoarthritis 12/24/2006    Priority: Low   Medications- reviewed and updated Current Outpatient Prescriptions  Medication Sig Dispense Refill  . acidophilus (RISAQUAD) CAPS capsule Take 2 capsules by mouth daily. 14 capsule 0  . amitriptyline (ELAVIL) 25 MG tablet Take 25 mg by  mouth 2 (two) times daily. Take 25 mg by mouth at 2200 (10PM) and take 25 mg by mouth at 0000 (12 AM)    . aspirin EC 81 MG EC tablet Take 1 tablet (81 mg total) by mouth daily.    Marland Kitchen omeprazole (PRILOSEC) 20 MG capsule Take 1 capsule (20 mg total) by mouth daily. 30 capsule 11  . primidone (MYSOLINE) 50 MG tablet Take 1 tablet (50 mg total) by mouth at bedtime. 30 tablet 3  . Probiotic Product ([place]) CAPS Take 1 capsule by mouth daily.     . sodium chloride (OCEAN) 0.65 % SOLN nasal spray Place 2 sprays into both nostrils at bedtime.    . meclizine (ANTIVERT) 12.5 MG tablet Take 25 mg by mouth every 6 (six) hours as needed for dizziness.    . traMADol (ULTRAM) 50 MG tablet Take 50 mg by mouth as needed. For neck pain    . valACYclovir (VALTREX) 500 MG tablet Take 500 mg by mouth daily as needed (for sores).     Objective: BP 140/92 mmHg  Temp(Src) 98.5 F (36.9 C)  Wt 204 lb (92.534 kg) (BP likely up due to pain-controlled last visit-continue to monitor) Gen: NAD, resting comfortably CV: RRR no murmurs rubs or gallops Lungs: CTAB no crackles, wheeze, rhonchi Ext: no edema Skin: warm, dry, no rash over skin Back - Normal skin, Spine with normal alignment and no deformity.  No tenderness to vertebral process palpation.  Paraspinous muscles are not tender and without spasm with exception of 1 particular spot in paraspinous muscle on left side that is tight and painful to palpation and reproduces pain.  Range of motion is full at neck and lumbar sacral regions. Negative Straight leg raise for radiation but SLR causes pain over this spot.  Neuro- no saddle anesthesia, 5/5 strength lower extremities, 2+ reflexes   Procedure trigger point Verbal Consent obtained and verified. Sterile betadine prep. Furthur cleansed with alcohol. Topical analgesic spray: Ethyl chloride. Left low back trigger point Approached in typical fashion with: 25 gauge 1/2 inch needle  Completed without  difficulty Meds: 1 cc 80mg /cc depo medrol and 3 cc xylocaine Aftercare instructions and Red flags advised. If completed in future, would use 1:1 steroid to lidocaine  Assessment/Plan:  Left low back pain Trigger point with associated spasm. Discussed trial flexeril but patient strongly advocates for injection (he will call if wants to trial after a few days). We completed this today without difficulty. Reasons for return discussed. Patient had immediate reduction in pain verifying appropriate location. We thoroughly discussed diabetes risk.   Also checked renal function given increase on last testing. Continue to avoid nsaids (he had taken 2 aleves for his back pain). Higher BP thought due to pain.   Return precautions advised.   Orders Placed This Encounter  Procedures  . Basic metabolic panel    [place]

## 2014-07-06 LAB — BASIC METABOLIC PANEL
BUN: 13 mg/dL (ref 6–23)
CO2: 28 mEq/L (ref 19–32)
Calcium: 9.6 mg/dL (ref 8.4–10.5)
Chloride: 100 mEq/L (ref 96–112)
Creatinine, Ser: 1.1 mg/dL (ref 0.4–1.5)
GFR: 69.43 mL/min (ref >60.00)
Glucose, Bld: 101 mg/dL — ABNORMAL HIGH (ref 70–99)
Potassium: 3.9 mEq/L (ref 3.5–5.1)
Sodium: 136 mEq/L (ref 135–145)

## 2014-07-07 ENCOUNTER — Telehealth: Payer: Self-pay | Admitting: Family Medicine

## 2014-07-07 MED ORDER — CYCLOBENZAPRINE HCL 5 MG PO TABS: 5.0000 mg | ORAL_TABLET | Freq: Three times a day (TID) | ORAL | Status: DC | PRN

## 2014-07-07 NOTE — Telephone Encounter (Signed)
Pt.notified

## 2014-07-07 NOTE — Telephone Encounter (Signed)
I e prescribed. Please inform patient.

## 2014-07-07 NOTE — Telephone Encounter (Signed)
-----   Message from Angela Adam, [place] sent at 07/07/2014 11:21 AM EST ----- Pt notified and states that his back is not doing better and would like to know if you will call in Flexeril for him.

## 2014-07-14 ENCOUNTER — Ambulatory Visit: Payer: Medicare Other | Admitting: Family Medicine

## 2014-07-14 ENCOUNTER — Ambulatory Visit: Payer: Medicare Other | Admitting: Neurology

## 2014-08-02 ENCOUNTER — Telehealth: Payer: Self-pay | Admitting: Family Medicine

## 2014-08-02 ENCOUNTER — Other Ambulatory Visit: Payer: Self-pay | Admitting: *Deleted

## 2014-08-02 MED ORDER — AMITRIPTYLINE HCL 25 MG PO TABS: 25.0000 mg | ORAL_TABLET | Freq: Two times a day (BID) | ORAL | Status: DC

## 2014-08-02 MED ORDER — VALACYCLOVIR HCL 500 MG PO TABS: 500.0000 mg | ORAL_TABLET | Freq: Every day | ORAL | Status: DC | PRN

## 2014-08-02 NOTE — Telephone Encounter (Signed)
Pharm needs clarification on elavil 25 mg

## 2014-08-02 NOTE — Addendum Note (Signed)
Addended by: Townsend Roger D on: 08/02/2014 11:59 AM   Modules accepted: Orders

## 2014-08-02 NOTE — Telephone Encounter (Signed)
Dr. Yong Channel spoke with pharmacy and clarified Rx.

## 2014-08-03 ENCOUNTER — Other Ambulatory Visit: Payer: Self-pay

## 2014-08-03 MED ORDER — AMITRIPTYLINE HCL 25 MG PO TABS
ORAL_TABLET | ORAL | Status: DC
Start: 2014-08-03 — End: 2018-05-07

## 2014-08-30 DIAGNOSIS — M5416 Radiculopathy, lumbar region: Secondary | ICD-10-CM | POA: Diagnosis not present

## 2014-09-21 DIAGNOSIS — Z86010 Personal history of colonic polyps: Secondary | ICD-10-CM | POA: Diagnosis not present

## 2014-09-21 DIAGNOSIS — R109 Unspecified abdominal pain: Secondary | ICD-10-CM | POA: Diagnosis not present

## 2014-09-21 DIAGNOSIS — R194 Change in bowel habit: Secondary | ICD-10-CM | POA: Diagnosis not present

## 2014-10-12 DIAGNOSIS — K30 Functional dyspepsia: Secondary | ICD-10-CM | POA: Diagnosis not present

## 2014-10-12 DIAGNOSIS — R109 Unspecified abdominal pain: Secondary | ICD-10-CM | POA: Diagnosis not present

## 2014-12-27 DIAGNOSIS — D122 Benign neoplasm of ascending colon: Secondary | ICD-10-CM | POA: Diagnosis not present

## 2014-12-27 DIAGNOSIS — Z86010 Personal history of colonic polyps: Secondary | ICD-10-CM | POA: Diagnosis not present

## 2014-12-27 DIAGNOSIS — K648 Other hemorrhoids: Secondary | ICD-10-CM | POA: Diagnosis not present

## 2014-12-27 DIAGNOSIS — R194 Change in bowel habit: Secondary | ICD-10-CM | POA: Diagnosis not present

## 2015-02-20 DIAGNOSIS — L570 Actinic keratosis: Secondary | ICD-10-CM | POA: Diagnosis not present

## 2015-02-20 DIAGNOSIS — L821 Other seborrheic keratosis: Secondary | ICD-10-CM | POA: Diagnosis not present

## 2015-02-20 DIAGNOSIS — D485 Neoplasm of uncertain behavior of skin: Secondary | ICD-10-CM | POA: Diagnosis not present

## 2015-02-20 DIAGNOSIS — Z85828 Personal history of other malignant neoplasm of skin: Secondary | ICD-10-CM | POA: Diagnosis not present

## 2015-02-20 DIAGNOSIS — Z08 Encounter for follow-up examination after completed treatment for malignant neoplasm: Secondary | ICD-10-CM | POA: Diagnosis not present

## 2015-02-20 DIAGNOSIS — C44529 Squamous cell carcinoma of skin of other part of trunk: Secondary | ICD-10-CM | POA: Diagnosis not present

## 2015-03-01 DIAGNOSIS — C44529 Squamous cell carcinoma of skin of other part of trunk: Secondary | ICD-10-CM | POA: Diagnosis not present

## 2015-03-08 DIAGNOSIS — K5730 Diverticulosis of large intestine without perforation or abscess without bleeding: Secondary | ICD-10-CM | POA: Diagnosis not present

## 2015-03-08 DIAGNOSIS — K219 Gastro-esophageal reflux disease without esophagitis: Secondary | ICD-10-CM | POA: Diagnosis not present

## 2015-03-08 DIAGNOSIS — K635 Polyp of colon: Secondary | ICD-10-CM | POA: Diagnosis not present

## 2015-03-23 DIAGNOSIS — M5012 Cervical disc disorder with radiculopathy, mid-cervical region: Secondary | ICD-10-CM | POA: Diagnosis not present

## 2015-03-23 DIAGNOSIS — Z79899 Other long term (current) drug therapy: Secondary | ICD-10-CM | POA: Diagnosis not present

## 2015-03-23 DIAGNOSIS — M791 Myalgia: Secondary | ICD-10-CM | POA: Diagnosis not present

## 2015-03-23 DIAGNOSIS — M5126 Other intervertebral disc displacement, lumbar region: Secondary | ICD-10-CM | POA: Diagnosis not present

## 2015-03-23 DIAGNOSIS — M5416 Radiculopathy, lumbar region: Secondary | ICD-10-CM | POA: Diagnosis not present

## 2015-03-23 DIAGNOSIS — M542 Cervicalgia: Secondary | ICD-10-CM | POA: Diagnosis not present

## 2015-03-29 DIAGNOSIS — K625 Hemorrhage of anus and rectum: Secondary | ICD-10-CM | POA: Diagnosis not present

## 2015-03-29 DIAGNOSIS — K648 Other hemorrhoids: Secondary | ICD-10-CM | POA: Diagnosis not present

## 2015-04-02 DIAGNOSIS — R1013 Epigastric pain: Secondary | ICD-10-CM | POA: Diagnosis not present

## 2015-04-27 ENCOUNTER — Encounter: Payer: Self-pay | Admitting: Gastroenterology

## 2015-05-15 DIAGNOSIS — K6289 Other specified diseases of anus and rectum: Secondary | ICD-10-CM | POA: Diagnosis not present

## 2015-05-15 DIAGNOSIS — K648 Other hemorrhoids: Secondary | ICD-10-CM | POA: Diagnosis not present

## 2015-06-19 DIAGNOSIS — K6289 Other specified diseases of anus and rectum: Secondary | ICD-10-CM | POA: Diagnosis not present

## 2015-06-19 DIAGNOSIS — K648 Other hemorrhoids: Secondary | ICD-10-CM | POA: Diagnosis not present

## 2015-07-19 DIAGNOSIS — M5416 Radiculopathy, lumbar region: Secondary | ICD-10-CM | POA: Diagnosis not present

## 2015-07-19 DIAGNOSIS — M47812 Spondylosis without myelopathy or radiculopathy, cervical region: Secondary | ICD-10-CM | POA: Diagnosis not present

## 2015-07-19 DIAGNOSIS — I10 Essential (primary) hypertension: Secondary | ICD-10-CM | POA: Diagnosis not present

## 2015-07-19 DIAGNOSIS — M791 Myalgia: Secondary | ICD-10-CM | POA: Diagnosis not present

## 2015-07-19 DIAGNOSIS — Z6831 Body mass index (BMI) 31.0-31.9, adult: Secondary | ICD-10-CM | POA: Diagnosis not present

## 2015-07-24 DIAGNOSIS — L570 Actinic keratosis: Secondary | ICD-10-CM | POA: Diagnosis not present

## 2015-08-06 DIAGNOSIS — M5416 Radiculopathy, lumbar region: Secondary | ICD-10-CM | POA: Diagnosis not present

## 2015-08-14 DIAGNOSIS — D485 Neoplasm of uncertain behavior of skin: Secondary | ICD-10-CM | POA: Diagnosis not present

## 2015-08-14 DIAGNOSIS — Z872 Personal history of diseases of the skin and subcutaneous tissue: Secondary | ICD-10-CM | POA: Diagnosis not present

## 2015-08-14 DIAGNOSIS — Z09 Encounter for follow-up examination after completed treatment for conditions other than malignant neoplasm: Secondary | ICD-10-CM | POA: Diagnosis not present

## 2015-08-14 DIAGNOSIS — L570 Actinic keratosis: Secondary | ICD-10-CM | POA: Diagnosis not present

## 2015-09-03 DIAGNOSIS — M5416 Radiculopathy, lumbar region: Secondary | ICD-10-CM | POA: Diagnosis not present

## 2015-09-19 DIAGNOSIS — L570 Actinic keratosis: Secondary | ICD-10-CM | POA: Diagnosis not present

## 2015-10-17 DIAGNOSIS — Z7189 Other specified counseling: Secondary | ICD-10-CM | POA: Diagnosis not present

## 2015-10-17 DIAGNOSIS — L820 Inflamed seborrheic keratosis: Secondary | ICD-10-CM | POA: Diagnosis not present

## 2015-10-17 DIAGNOSIS — Z872 Personal history of diseases of the skin and subcutaneous tissue: Secondary | ICD-10-CM | POA: Diagnosis not present

## 2015-10-17 DIAGNOSIS — Z09 Encounter for follow-up examination after completed treatment for conditions other than malignant neoplasm: Secondary | ICD-10-CM | POA: Diagnosis not present

## 2015-12-14 DIAGNOSIS — R202 Paresthesia of skin: Secondary | ICD-10-CM | POA: Diagnosis not present

## 2015-12-14 DIAGNOSIS — I10 Essential (primary) hypertension: Secondary | ICD-10-CM | POA: Diagnosis not present

## 2015-12-14 DIAGNOSIS — K5732 Diverticulitis of large intestine without perforation or abscess without bleeding: Secondary | ICD-10-CM | POA: Diagnosis not present

## 2015-12-14 DIAGNOSIS — Z9181 History of falling: Secondary | ICD-10-CM | POA: Diagnosis not present

## 2015-12-14 DIAGNOSIS — G459 Transient cerebral ischemic attack, unspecified: Secondary | ICD-10-CM | POA: Diagnosis not present

## 2015-12-14 DIAGNOSIS — I361 Nonrheumatic tricuspid (valve) insufficiency: Secondary | ICD-10-CM | POA: Diagnosis not present

## 2015-12-14 DIAGNOSIS — Z7982 Long term (current) use of aspirin: Secondary | ICD-10-CM | POA: Diagnosis not present

## 2015-12-14 DIAGNOSIS — D333 Benign neoplasm of cranial nerves: Secondary | ICD-10-CM | POA: Diagnosis not present

## 2015-12-14 DIAGNOSIS — R51 Headache: Secondary | ICD-10-CM | POA: Diagnosis not present

## 2015-12-14 DIAGNOSIS — K219 Gastro-esophageal reflux disease without esophagitis: Secondary | ICD-10-CM | POA: Diagnosis not present

## 2015-12-14 DIAGNOSIS — I639 Cerebral infarction, unspecified: Secondary | ICD-10-CM | POA: Diagnosis not present

## 2015-12-14 DIAGNOSIS — R200 Anesthesia of skin: Secondary | ICD-10-CM | POA: Diagnosis not present

## 2015-12-14 DIAGNOSIS — I358 Other nonrheumatic aortic valve disorders: Secondary | ICD-10-CM | POA: Diagnosis not present

## 2015-12-14 DIAGNOSIS — R251 Tremor, unspecified: Secondary | ICD-10-CM | POA: Diagnosis not present

## 2015-12-14 DIAGNOSIS — D72829 Elevated white blood cell count, unspecified: Secondary | ICD-10-CM | POA: Diagnosis not present

## 2015-12-15 DIAGNOSIS — I358 Other nonrheumatic aortic valve disorders: Secondary | ICD-10-CM | POA: Diagnosis not present

## 2015-12-15 DIAGNOSIS — M50221 Other cervical disc displacement at C4-C5 level: Secondary | ICD-10-CM | POA: Diagnosis not present

## 2015-12-15 DIAGNOSIS — I361 Nonrheumatic tricuspid (valve) insufficiency: Secondary | ICD-10-CM | POA: Diagnosis not present

## 2015-12-15 DIAGNOSIS — R200 Anesthesia of skin: Secondary | ICD-10-CM | POA: Diagnosis not present

## 2015-12-15 DIAGNOSIS — G459 Transient cerebral ischemic attack, unspecified: Secondary | ICD-10-CM | POA: Diagnosis not present

## 2015-12-15 DIAGNOSIS — R202 Paresthesia of skin: Secondary | ICD-10-CM | POA: Diagnosis not present

## 2015-12-15 DIAGNOSIS — K5732 Diverticulitis of large intestine without perforation or abscess without bleeding: Secondary | ICD-10-CM | POA: Diagnosis not present

## 2015-12-15 DIAGNOSIS — M47813 Spondylosis without myelopathy or radiculopathy, cervicothoracic region: Secondary | ICD-10-CM | POA: Diagnosis not present

## 2015-12-15 DIAGNOSIS — M4312 Spondylolisthesis, cervical region: Secondary | ICD-10-CM | POA: Diagnosis not present

## 2015-12-15 DIAGNOSIS — K219 Gastro-esophageal reflux disease without esophagitis: Secondary | ICD-10-CM | POA: Diagnosis not present

## 2015-12-15 DIAGNOSIS — D72829 Elevated white blood cell count, unspecified: Secondary | ICD-10-CM | POA: Diagnosis not present

## 2015-12-15 DIAGNOSIS — M4802 Spinal stenosis, cervical region: Secondary | ICD-10-CM | POA: Diagnosis not present

## 2015-12-27 DIAGNOSIS — Z789 Other specified health status: Secondary | ICD-10-CM | POA: Diagnosis not present

## 2015-12-27 DIAGNOSIS — L249 Irritant contact dermatitis, unspecified cause: Secondary | ICD-10-CM | POA: Diagnosis not present

## 2015-12-27 DIAGNOSIS — L570 Actinic keratosis: Secondary | ICD-10-CM | POA: Diagnosis not present

## 2015-12-27 DIAGNOSIS — L820 Inflamed seborrheic keratosis: Secondary | ICD-10-CM | POA: Diagnosis not present

## 2015-12-27 DIAGNOSIS — L538 Other specified erythematous conditions: Secondary | ICD-10-CM | POA: Diagnosis not present

## 2016-01-02 DIAGNOSIS — I10 Essential (primary) hypertension: Secondary | ICD-10-CM | POA: Diagnosis not present

## 2016-01-02 DIAGNOSIS — Z8673 Personal history of transient ischemic attack (TIA), and cerebral infarction without residual deficits: Secondary | ICD-10-CM | POA: Diagnosis not present

## 2016-01-02 DIAGNOSIS — R251 Tremor, unspecified: Secondary | ICD-10-CM | POA: Diagnosis not present

## 2016-01-02 DIAGNOSIS — E785 Hyperlipidemia, unspecified: Secondary | ICD-10-CM | POA: Diagnosis not present

## 2016-01-09 DIAGNOSIS — Z6830 Body mass index (BMI) 30.0-30.9, adult: Secondary | ICD-10-CM | POA: Diagnosis not present

## 2016-01-09 DIAGNOSIS — Z23 Encounter for immunization: Secondary | ICD-10-CM | POA: Diagnosis not present

## 2016-01-09 DIAGNOSIS — M542 Cervicalgia: Secondary | ICD-10-CM | POA: Diagnosis not present

## 2016-01-09 DIAGNOSIS — R52 Pain, unspecified: Secondary | ICD-10-CM | POA: Diagnosis not present

## 2016-01-17 DIAGNOSIS — M5126 Other intervertebral disc displacement, lumbar region: Secondary | ICD-10-CM | POA: Diagnosis not present

## 2016-01-17 DIAGNOSIS — M5012 Mid-cervical disc disorder, unspecified level: Secondary | ICD-10-CM | POA: Diagnosis not present

## 2016-01-17 DIAGNOSIS — M791 Myalgia: Secondary | ICD-10-CM | POA: Diagnosis not present

## 2016-02-08 DIAGNOSIS — R251 Tremor, unspecified: Secondary | ICD-10-CM | POA: Diagnosis not present

## 2016-02-08 DIAGNOSIS — G459 Transient cerebral ischemic attack, unspecified: Secondary | ICD-10-CM | POA: Diagnosis not present

## 2016-02-19 DIAGNOSIS — I10 Essential (primary) hypertension: Secondary | ICD-10-CM | POA: Diagnosis not present

## 2016-02-19 DIAGNOSIS — Z136 Encounter for screening for cardiovascular disorders: Secondary | ICD-10-CM | POA: Diagnosis not present

## 2016-02-20 DIAGNOSIS — G4733 Obstructive sleep apnea (adult) (pediatric): Secondary | ICD-10-CM | POA: Diagnosis not present

## 2016-02-20 DIAGNOSIS — G479 Sleep disorder, unspecified: Secondary | ICD-10-CM | POA: Diagnosis not present

## 2016-02-20 DIAGNOSIS — R0683 Snoring: Secondary | ICD-10-CM | POA: Diagnosis not present

## 2016-02-25 DIAGNOSIS — G4733 Obstructive sleep apnea (adult) (pediatric): Secondary | ICD-10-CM | POA: Diagnosis not present

## 2016-03-05 DIAGNOSIS — G4733 Obstructive sleep apnea (adult) (pediatric): Secondary | ICD-10-CM | POA: Diagnosis not present

## 2016-03-10 DIAGNOSIS — R7303 Prediabetes: Secondary | ICD-10-CM | POA: Diagnosis not present

## 2016-03-10 DIAGNOSIS — Z1159 Encounter for screening for other viral diseases: Secondary | ICD-10-CM | POA: Diagnosis not present

## 2016-03-17 DIAGNOSIS — Z6831 Body mass index (BMI) 31.0-31.9, adult: Secondary | ICD-10-CM | POA: Diagnosis not present

## 2016-03-17 DIAGNOSIS — R7303 Prediabetes: Secondary | ICD-10-CM | POA: Diagnosis not present

## 2016-03-17 DIAGNOSIS — I10 Essential (primary) hypertension: Secondary | ICD-10-CM | POA: Diagnosis not present

## 2016-03-17 DIAGNOSIS — Z23 Encounter for immunization: Secondary | ICD-10-CM | POA: Diagnosis not present

## 2016-03-17 DIAGNOSIS — E785 Hyperlipidemia, unspecified: Secondary | ICD-10-CM | POA: Diagnosis not present

## 2016-03-17 DIAGNOSIS — Z0000 Encounter for general adult medical examination without abnormal findings: Secondary | ICD-10-CM | POA: Diagnosis not present

## 2016-03-31 DIAGNOSIS — H2513 Age-related nuclear cataract, bilateral: Secondary | ICD-10-CM | POA: Diagnosis not present

## 2016-04-16 DIAGNOSIS — R140 Abdominal distension (gaseous): Secondary | ICD-10-CM | POA: Diagnosis not present

## 2016-04-16 DIAGNOSIS — K5900 Constipation, unspecified: Secondary | ICD-10-CM | POA: Diagnosis not present

## 2016-04-30 DIAGNOSIS — G250 Essential tremor: Secondary | ICD-10-CM | POA: Diagnosis not present

## 2016-05-01 DIAGNOSIS — M791 Myalgia: Secondary | ICD-10-CM | POA: Diagnosis not present

## 2016-05-01 DIAGNOSIS — M5416 Radiculopathy, lumbar region: Secondary | ICD-10-CM | POA: Diagnosis not present

## 2016-05-01 DIAGNOSIS — M47812 Spondylosis without myelopathy or radiculopathy, cervical region: Secondary | ICD-10-CM | POA: Diagnosis not present

## 2016-05-01 DIAGNOSIS — G894 Chronic pain syndrome: Secondary | ICD-10-CM | POA: Diagnosis not present

## 2016-05-01 DIAGNOSIS — M545 Low back pain: Secondary | ICD-10-CM | POA: Diagnosis not present

## 2016-05-01 DIAGNOSIS — Z79891 Long term (current) use of opiate analgesic: Secondary | ICD-10-CM | POA: Diagnosis not present

## 2016-05-01 DIAGNOSIS — Z79899 Other long term (current) drug therapy: Secondary | ICD-10-CM | POA: Diagnosis not present

## 2016-05-06 DIAGNOSIS — R12 Heartburn: Secondary | ICD-10-CM | POA: Diagnosis not present

## 2016-05-06 DIAGNOSIS — R140 Abdominal distension (gaseous): Secondary | ICD-10-CM | POA: Diagnosis not present

## 2016-05-13 DIAGNOSIS — L570 Actinic keratosis: Secondary | ICD-10-CM | POA: Diagnosis not present

## 2016-05-13 DIAGNOSIS — L820 Inflamed seborrheic keratosis: Secondary | ICD-10-CM | POA: Diagnosis not present

## 2016-05-13 DIAGNOSIS — L718 Other rosacea: Secondary | ICD-10-CM | POA: Diagnosis not present

## 2016-05-14 DIAGNOSIS — K224 Dyskinesia of esophagus: Secondary | ICD-10-CM | POA: Diagnosis not present

## 2016-05-14 DIAGNOSIS — K449 Diaphragmatic hernia without obstruction or gangrene: Secondary | ICD-10-CM | POA: Diagnosis not present

## 2016-05-14 DIAGNOSIS — I10 Essential (primary) hypertension: Secondary | ICD-10-CM | POA: Diagnosis not present

## 2016-05-14 DIAGNOSIS — K2990 Gastroduodenitis, unspecified, without bleeding: Secondary | ICD-10-CM | POA: Diagnosis not present

## 2016-05-14 DIAGNOSIS — K219 Gastro-esophageal reflux disease without esophagitis: Secondary | ICD-10-CM | POA: Diagnosis not present

## 2016-05-14 DIAGNOSIS — R12 Heartburn: Secondary | ICD-10-CM | POA: Diagnosis not present

## 2016-05-14 DIAGNOSIS — R140 Abdominal distension (gaseous): Secondary | ICD-10-CM | POA: Diagnosis not present

## 2016-05-16 DIAGNOSIS — K2990 Gastroduodenitis, unspecified, without bleeding: Secondary | ICD-10-CM | POA: Diagnosis not present

## 2016-05-16 DIAGNOSIS — R140 Abdominal distension (gaseous): Secondary | ICD-10-CM | POA: Diagnosis not present

## 2016-05-16 DIAGNOSIS — K319 Disease of stomach and duodenum, unspecified: Secondary | ICD-10-CM | POA: Diagnosis not present

## 2016-05-16 DIAGNOSIS — I10 Essential (primary) hypertension: Secondary | ICD-10-CM | POA: Diagnosis not present

## 2016-05-16 DIAGNOSIS — K219 Gastro-esophageal reflux disease without esophagitis: Secondary | ICD-10-CM | POA: Diagnosis not present

## 2016-05-16 DIAGNOSIS — R12 Heartburn: Secondary | ICD-10-CM | POA: Diagnosis not present

## 2016-05-16 DIAGNOSIS — K449 Diaphragmatic hernia without obstruction or gangrene: Secondary | ICD-10-CM | POA: Diagnosis not present

## 2016-05-16 DIAGNOSIS — K224 Dyskinesia of esophagus: Secondary | ICD-10-CM | POA: Diagnosis not present

## 2016-05-16 DIAGNOSIS — K229 Disease of esophagus, unspecified: Secondary | ICD-10-CM | POA: Diagnosis not present

## 2016-05-16 DIAGNOSIS — K2970 Gastritis, unspecified, without bleeding: Secondary | ICD-10-CM | POA: Diagnosis not present

## 2016-06-02 DIAGNOSIS — K2980 Duodenitis without bleeding: Secondary | ICD-10-CM | POA: Diagnosis not present

## 2016-06-02 DIAGNOSIS — R140 Abdominal distension (gaseous): Secondary | ICD-10-CM | POA: Diagnosis not present

## 2016-06-02 DIAGNOSIS — K2970 Gastritis, unspecified, without bleeding: Secondary | ICD-10-CM | POA: Diagnosis not present

## 2016-06-03 DIAGNOSIS — R140 Abdominal distension (gaseous): Secondary | ICD-10-CM | POA: Diagnosis not present

## 2016-06-04 DIAGNOSIS — N529 Male erectile dysfunction, unspecified: Secondary | ICD-10-CM | POA: Diagnosis not present

## 2016-06-04 DIAGNOSIS — Z6831 Body mass index (BMI) 31.0-31.9, adult: Secondary | ICD-10-CM | POA: Diagnosis not present

## 2016-06-04 DIAGNOSIS — G5792 Unspecified mononeuropathy of left lower limb: Secondary | ICD-10-CM | POA: Diagnosis not present

## 2016-06-04 DIAGNOSIS — Z23 Encounter for immunization: Secondary | ICD-10-CM | POA: Diagnosis not present

## 2016-06-12 DIAGNOSIS — K5730 Diverticulosis of large intestine without perforation or abscess without bleeding: Secondary | ICD-10-CM | POA: Diagnosis not present

## 2016-06-12 DIAGNOSIS — K5909 Other constipation: Secondary | ICD-10-CM | POA: Diagnosis not present

## 2016-06-12 DIAGNOSIS — A049 Bacterial intestinal infection, unspecified: Secondary | ICD-10-CM | POA: Diagnosis not present

## 2016-06-12 DIAGNOSIS — K2990 Gastroduodenitis, unspecified, without bleeding: Secondary | ICD-10-CM | POA: Diagnosis not present

## 2016-06-16 DIAGNOSIS — M5417 Radiculopathy, lumbosacral region: Secondary | ICD-10-CM | POA: Diagnosis not present

## 2016-06-16 DIAGNOSIS — M79605 Pain in left leg: Secondary | ICD-10-CM | POA: Diagnosis not present

## 2016-06-16 DIAGNOSIS — R208 Other disturbances of skin sensation: Secondary | ICD-10-CM | POA: Diagnosis not present

## 2016-06-16 DIAGNOSIS — R200 Anesthesia of skin: Secondary | ICD-10-CM | POA: Diagnosis not present

## 2016-06-16 DIAGNOSIS — M5416 Radiculopathy, lumbar region: Secondary | ICD-10-CM | POA: Diagnosis not present

## 2016-07-31 DIAGNOSIS — M5416 Radiculopathy, lumbar region: Secondary | ICD-10-CM | POA: Diagnosis not present

## 2016-07-31 DIAGNOSIS — M47812 Spondylosis without myelopathy or radiculopathy, cervical region: Secondary | ICD-10-CM | POA: Diagnosis not present

## 2016-07-31 DIAGNOSIS — M791 Myalgia: Secondary | ICD-10-CM | POA: Diagnosis not present

## 2016-07-31 DIAGNOSIS — Z6831 Body mass index (BMI) 31.0-31.9, adult: Secondary | ICD-10-CM | POA: Diagnosis not present

## 2016-08-04 DIAGNOSIS — M542 Cervicalgia: Secondary | ICD-10-CM | POA: Diagnosis not present

## 2016-08-04 DIAGNOSIS — J302 Other seasonal allergic rhinitis: Secondary | ICD-10-CM | POA: Diagnosis not present

## 2016-08-04 DIAGNOSIS — R52 Pain, unspecified: Secondary | ICD-10-CM | POA: Diagnosis not present

## 2016-08-04 DIAGNOSIS — G609 Hereditary and idiopathic neuropathy, unspecified: Secondary | ICD-10-CM | POA: Diagnosis not present

## 2016-08-04 DIAGNOSIS — R6889 Other general symptoms and signs: Secondary | ICD-10-CM | POA: Diagnosis not present

## 2016-08-04 DIAGNOSIS — R140 Abdominal distension (gaseous): Secondary | ICD-10-CM | POA: Diagnosis not present

## 2016-08-04 DIAGNOSIS — R12 Heartburn: Secondary | ICD-10-CM | POA: Diagnosis not present

## 2016-08-05 ENCOUNTER — Other Ambulatory Visit: Payer: Self-pay | Admitting: Rehabilitation

## 2016-08-05 DIAGNOSIS — M5416 Radiculopathy, lumbar region: Secondary | ICD-10-CM

## 2016-08-11 DIAGNOSIS — M48061 Spinal stenosis, lumbar region without neurogenic claudication: Secondary | ICD-10-CM | POA: Diagnosis not present

## 2016-08-11 DIAGNOSIS — M5127 Other intervertebral disc displacement, lumbosacral region: Secondary | ICD-10-CM | POA: Diagnosis not present

## 2016-08-11 DIAGNOSIS — M5186 Other intervertebral disc disorders, lumbar region: Secondary | ICD-10-CM | POA: Diagnosis not present

## 2016-08-11 DIAGNOSIS — M5125 Other intervertebral disc displacement, thoracolumbar region: Secondary | ICD-10-CM | POA: Diagnosis not present

## 2016-08-11 DIAGNOSIS — M5416 Radiculopathy, lumbar region: Secondary | ICD-10-CM | POA: Diagnosis not present

## 2016-08-18 DIAGNOSIS — M5416 Radiculopathy, lumbar region: Secondary | ICD-10-CM | POA: Diagnosis not present

## 2016-08-22 DIAGNOSIS — E785 Hyperlipidemia, unspecified: Secondary | ICD-10-CM | POA: Diagnosis not present

## 2016-08-22 DIAGNOSIS — G4709 Other insomnia: Secondary | ICD-10-CM | POA: Diagnosis not present

## 2016-08-22 DIAGNOSIS — I10 Essential (primary) hypertension: Secondary | ICD-10-CM | POA: Diagnosis not present

## 2016-08-22 DIAGNOSIS — Z760 Encounter for issue of repeat prescription: Secondary | ICD-10-CM | POA: Diagnosis not present

## 2016-08-22 DIAGNOSIS — T753XXS Motion sickness, sequela: Secondary | ICD-10-CM | POA: Diagnosis not present

## 2016-09-01 DIAGNOSIS — R150 Incomplete defecation: Secondary | ICD-10-CM | POA: Diagnosis not present

## 2016-09-01 DIAGNOSIS — A049 Bacterial intestinal infection, unspecified: Secondary | ICD-10-CM | POA: Diagnosis not present

## 2016-09-01 DIAGNOSIS — K210 Gastro-esophageal reflux disease with esophagitis: Secondary | ICD-10-CM | POA: Diagnosis not present

## 2016-09-01 DIAGNOSIS — R140 Abdominal distension (gaseous): Secondary | ICD-10-CM | POA: Diagnosis not present

## 2016-09-01 DIAGNOSIS — Z6831 Body mass index (BMI) 31.0-31.9, adult: Secondary | ICD-10-CM | POA: Diagnosis not present

## 2016-09-01 DIAGNOSIS — K5909 Other constipation: Secondary | ICD-10-CM | POA: Diagnosis not present

## 2016-09-15 DIAGNOSIS — M5416 Radiculopathy, lumbar region: Secondary | ICD-10-CM | POA: Diagnosis not present

## 2016-09-22 DIAGNOSIS — M545 Low back pain: Secondary | ICD-10-CM | POA: Diagnosis not present

## 2016-09-22 DIAGNOSIS — R7303 Prediabetes: Secondary | ICD-10-CM | POA: Diagnosis not present

## 2016-09-22 DIAGNOSIS — A049 Bacterial intestinal infection, unspecified: Secondary | ICD-10-CM | POA: Diagnosis not present

## 2016-09-22 DIAGNOSIS — Z8673 Personal history of transient ischemic attack (TIA), and cerebral infarction without residual deficits: Secondary | ICD-10-CM | POA: Diagnosis not present

## 2016-09-22 DIAGNOSIS — G609 Hereditary and idiopathic neuropathy, unspecified: Secondary | ICD-10-CM | POA: Diagnosis not present

## 2016-09-22 DIAGNOSIS — K219 Gastro-esophageal reflux disease without esophagitis: Secondary | ICD-10-CM | POA: Diagnosis not present

## 2016-09-22 DIAGNOSIS — I10 Essential (primary) hypertension: Secondary | ICD-10-CM | POA: Diagnosis not present

## 2016-11-03 DIAGNOSIS — K5900 Constipation, unspecified: Secondary | ICD-10-CM | POA: Diagnosis not present

## 2016-11-03 DIAGNOSIS — A049 Bacterial intestinal infection, unspecified: Secondary | ICD-10-CM | POA: Diagnosis not present

## 2016-11-03 DIAGNOSIS — R140 Abdominal distension (gaseous): Secondary | ICD-10-CM | POA: Diagnosis not present

## 2016-11-03 DIAGNOSIS — K210 Gastro-esophageal reflux disease with esophagitis: Secondary | ICD-10-CM | POA: Diagnosis not present

## 2016-11-13 DIAGNOSIS — M545 Low back pain: Secondary | ICD-10-CM | POA: Diagnosis not present

## 2016-11-13 DIAGNOSIS — M791 Myalgia: Secondary | ICD-10-CM | POA: Diagnosis not present

## 2016-11-13 DIAGNOSIS — M5012 Mid-cervical disc disorder, unspecified level: Secondary | ICD-10-CM | POA: Diagnosis not present

## 2016-11-13 DIAGNOSIS — M47812 Spondylosis without myelopathy or radiculopathy, cervical region: Secondary | ICD-10-CM | POA: Diagnosis not present

## 2017-03-25 DIAGNOSIS — Z1389 Encounter for screening for other disorder: Secondary | ICD-10-CM | POA: Diagnosis not present

## 2017-03-25 DIAGNOSIS — Z0000 Encounter for general adult medical examination without abnormal findings: Secondary | ICD-10-CM | POA: Diagnosis not present

## 2017-03-25 DIAGNOSIS — E673 Hypervitaminosis D: Secondary | ICD-10-CM | POA: Diagnosis not present

## 2017-03-25 DIAGNOSIS — E785 Hyperlipidemia, unspecified: Secondary | ICD-10-CM | POA: Diagnosis not present

## 2017-03-25 DIAGNOSIS — I10 Essential (primary) hypertension: Secondary | ICD-10-CM | POA: Diagnosis not present

## 2017-03-25 DIAGNOSIS — Z6831 Body mass index (BMI) 31.0-31.9, adult: Secondary | ICD-10-CM | POA: Diagnosis not present

## 2017-03-25 DIAGNOSIS — Z23 Encounter for immunization: Secondary | ICD-10-CM | POA: Diagnosis not present

## 2017-03-25 DIAGNOSIS — Z8673 Personal history of transient ischemic attack (TIA), and cerebral infarction without residual deficits: Secondary | ICD-10-CM | POA: Diagnosis not present

## 2017-03-25 DIAGNOSIS — R7303 Prediabetes: Secondary | ICD-10-CM | POA: Diagnosis not present

## 2017-03-25 DIAGNOSIS — Z7189 Other specified counseling: Secondary | ICD-10-CM | POA: Diagnosis not present

## 2017-04-01 DIAGNOSIS — Z6831 Body mass index (BMI) 31.0-31.9, adult: Secondary | ICD-10-CM | POA: Diagnosis not present

## 2017-04-01 DIAGNOSIS — R899 Unspecified abnormal finding in specimens from other organs, systems and tissues: Secondary | ICD-10-CM | POA: Diagnosis not present

## 2017-04-01 DIAGNOSIS — N182 Chronic kidney disease, stage 2 (mild): Secondary | ICD-10-CM | POA: Diagnosis not present

## 2017-04-01 DIAGNOSIS — R7303 Prediabetes: Secondary | ICD-10-CM | POA: Diagnosis not present

## 2017-04-01 DIAGNOSIS — E785 Hyperlipidemia, unspecified: Secondary | ICD-10-CM | POA: Diagnosis not present

## 2017-06-12 DIAGNOSIS — E785 Hyperlipidemia, unspecified: Secondary | ICD-10-CM | POA: Diagnosis not present

## 2017-06-15 DIAGNOSIS — E785 Hyperlipidemia, unspecified: Secondary | ICD-10-CM | POA: Diagnosis not present

## 2018-02-18 DIAGNOSIS — K922 Gastrointestinal hemorrhage, unspecified: Secondary | ICD-10-CM | POA: Diagnosis not present

## 2018-02-18 DIAGNOSIS — M47812 Spondylosis without myelopathy or radiculopathy, cervical region: Secondary | ICD-10-CM | POA: Diagnosis not present

## 2018-02-18 DIAGNOSIS — M7711 Lateral epicondylitis, right elbow: Secondary | ICD-10-CM | POA: Diagnosis not present

## 2018-02-18 DIAGNOSIS — Z6831 Body mass index (BMI) 31.0-31.9, adult: Secondary | ICD-10-CM | POA: Diagnosis not present

## 2018-02-18 DIAGNOSIS — M25512 Pain in left shoulder: Secondary | ICD-10-CM | POA: Diagnosis not present

## 2018-05-07 ENCOUNTER — Ambulatory Visit (INDEPENDENT_AMBULATORY_CARE_PROVIDER_SITE_OTHER): Payer: Medicare Other | Admitting: Family Medicine

## 2018-05-07 ENCOUNTER — Encounter: Payer: Self-pay | Admitting: Family Medicine

## 2018-05-07 VITALS — BP 140/80 | HR 91 | Temp 98.4°F | Ht 68.0 in | Wt 202.7 lb

## 2018-05-07 DIAGNOSIS — J309 Allergic rhinitis, unspecified: Secondary | ICD-10-CM | POA: Diagnosis not present

## 2018-05-07 DIAGNOSIS — T753XXA Motion sickness, initial encounter: Secondary | ICD-10-CM

## 2018-05-07 DIAGNOSIS — Z23 Encounter for immunization: Secondary | ICD-10-CM

## 2018-05-07 DIAGNOSIS — E785 Hyperlipidemia, unspecified: Secondary | ICD-10-CM | POA: Diagnosis not present

## 2018-05-07 DIAGNOSIS — J329 Chronic sinusitis, unspecified: Secondary | ICD-10-CM | POA: Diagnosis not present

## 2018-05-07 DIAGNOSIS — K219 Gastro-esophageal reflux disease without esophagitis: Secondary | ICD-10-CM | POA: Diagnosis not present

## 2018-05-07 DIAGNOSIS — R7301 Impaired fasting glucose: Secondary | ICD-10-CM

## 2018-05-07 DIAGNOSIS — B001 Herpesviral vesicular dermatitis: Secondary | ICD-10-CM

## 2018-05-07 LAB — COMPREHENSIVE METABOLIC PANEL
ALT: 16 U/L (ref 0–53)
AST: 16 U/L (ref 0–37)
Albumin: 4.4 g/dL (ref 3.5–5.2)
Alkaline Phosphatase: 90 U/L (ref 39–117)
BUN: 12 mg/dL (ref 6–23)
CHLORIDE: 99 meq/L (ref 96–112)
CO2: 31 meq/L (ref 19–32)
Calcium: 9.5 mg/dL (ref 8.4–10.5)
Creatinine, Ser: 1.08 mg/dL (ref 0.40–1.50)
GFR: 71.6 mL/min (ref >60.00)
GLUCOSE: 91 mg/dL (ref 70–99)
POTASSIUM: 3.8 meq/L (ref 3.5–5.1)
Sodium: 137 mEq/L (ref 135–145)
Total Bilirubin: 0.4 mg/dL (ref 0.2–1.2)
Total Protein: 6.9 g/dL (ref 6.0–8.3)

## 2018-05-07 LAB — LIPID PANEL
Cholesterol: 184 mg/dL (ref 0–200)
HDL: 46.3 mg/dL (ref >39.00)
NONHDL: 137.78
TRIGLYCERIDES: 247 mg/dL — AB (ref 0.0–149.0)
Total CHOL/HDL Ratio: 4
VLDL: 49.4 mg/dL — AB (ref 0.0–40.0)

## 2018-05-07 LAB — HEMOGLOBIN A1C: Hgb A1c MFr Bld: 6.4 % (ref 4.6–6.5)

## 2018-05-07 LAB — LDL CHOLESTEROL, DIRECT: LDL DIRECT: 113 mg/dL

## 2018-05-07 MED ORDER — AMITRIPTYLINE HCL 50 MG PO TABS: 50.0000 mg | ORAL_TABLET | Freq: Every day | ORAL | 1 refills | Status: DC

## 2018-05-07 MED ORDER — MECLIZINE HCL 12.5 MG PO TABS: 12.5000 mg | ORAL_TABLET | Freq: Four times a day (QID) | ORAL | 1 refills | Status: DC | PRN

## 2018-05-07 MED ORDER — FLUTICASONE PROPIONATE 50 MCG/ACT NA SUSP: 2.0000 | Freq: Every day | NASAL | 6 refills | Status: AC

## 2018-05-07 MED ORDER — PANTOPRAZOLE SODIUM 40 MG PO TBEC: 40.0000 mg | DELAYED_RELEASE_TABLET | Freq: Every day | ORAL | 1 refills | Status: DC

## 2018-05-07 MED ORDER — DOXYCYCLINE HYCLATE 100 MG PO TABS: 100.0000 mg | ORAL_TABLET | Freq: Two times a day (BID) | ORAL | 0 refills | Status: DC

## 2018-05-07 MED ORDER — VALACYCLOVIR HCL 500 MG PO TABS: 500.0000 mg | ORAL_TABLET | Freq: Every day | ORAL | 5 refills | Status: DC | PRN

## 2018-05-07 NOTE — Progress Notes (Signed)
[name] DOB: Feb 09, 1946 Encounter date: 05/07/2018  This is a 72 y.o. male who presents to establish care. Chief Complaint  Patient presents with  . New Patient (Initial Visit)    flu shot, discuss pnue. vaccine    History of present illness: Lived here a few years ago and just moving back.   Has been having ongoing sinus issues. Has been getting the sudafed OTC. Lasted over last few weeks, but just not going away. Feels that the OTC medications are keeping him from being sick, but still with underlying symptoms. Motion sickness is big issue for him. Car sick easily, rides with air on and has to drive. 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 Has issues with ear opening and closing. Has had some off and on sweats at night; not sure if true fever/chills. Hasn't had any antibiotics for 5 years. Has dealt with current symptoms for 6-8 weeks now. This morning had nose bleed as well.   Was in [place] and was discharged due to vomiting.   HTN:was taking lisinopril but stopped 2 months ago due to not having refill available.  GERD: has to take the pantoprazole which keeps it well controlled.   Arthritis: has had injections in back/neck/shoulder area. Missed step and fell on shoulder/arm on left. Sees Dr. Roma Schanz for this.   Has seen 2 neurologists for tremor. Has been told it is nothing to worry about.    Past Medical History:  Diagnosis Date  . ALLERGIC RHINITIS CAUSE UNSPECIFIED 05/27/2010  . BACK PAIN, UPPER 02/21/2010  . Cervicalgia 05/27/2010  . CHEST PAIN UNSPECIFIED 04/25/2009  . DIVERTICULOSIS, COLON 03/09/2007  . DIZZINESS, CHRONIC 03/09/2007  . GERD 12/24/2006  . HEADACHE, SINUS 04/06/2009  . HYPERLIPIDEMIA 12/24/2006   diet controlled  . HYPERTENSION 12/24/2006   better after weight loss  . INSOMNIA WITH SLEEP APNEA UNSPECIFIED 05/27/2010  . Open wound of wrist, complicated 07/02/7251  . OSTEOARTHRITIS 12/24/2006  . Other constipation  11/06/2009  . PONV (postoperative nausea and vomiting)   . PROSTATITIS, RECURRENT 12/30/2007  . SHOULDER PAIN, BILATERAL 07/16/2007  . TRANSIENT ISCHEMIC ATTACKS, HX OF 04/06/2009   Past Surgical History:  Procedure Laterality Date  . ANKLE ARTHROSCOPY W/ OPEN REPAIR  2002   Repair  . COLONOSCOPY    . CYST EXCISION     from back 2015. likely this was meaning of ear cyst  . EAR CYST EXCISION Bilateral 01/18/2013   Procedure: EXCISION CYSTS FROM BACK (TWO);  Surgeon: Earnstine Regal, MD;  Location: [facility];  Service: General;  Laterality: Bilateral;  . epidydmal cyst excision    . REPLACEMENT TOTAL KNEE BILATERAL  2005/2007  . TONSILLECTOMY     Allergies  Allergen Reactions  . Codeine Other (See Comments)    REACTION: Nausea   Current Meds  Medication Sig  . aspirin EC 81 MG EC tablet Take 1 tablet (81 mg total) by mouth daily.  . meclizine (ANTIVERT) 12.5 MG tablet Take 1-2 tablets (12.5-25 mg total) by mouth every 6 (six) hours as needed for dizziness.  . pantoprazole (PROTONIX) 40 MG tablet Take 1 tablet (40 mg total) by mouth daily.  . Probiotic Product ([place]) CAPS Take 1 capsule by mouth daily.   . sodium chloride (OCEAN) 0.65 % SOLN nasal spray Place 2 sprays into both nostrils at bedtime.  . valACYclovir (VALTREX) 500 MG tablet Take 1 tablet (500 mg total) by mouth daily  as needed (for sores).  . [DISCONTINUED] acidophilus (RISAQUAD) CAPS capsule Take 2 capsules by mouth daily.  . [DISCONTINUED] amitriptyline (ELAVIL) 25 MG tablet Take 5 tablets by mouth at bed time. (Patient taking differently: 50 mg. Take 5 tablets by mouth at bed time.)  . [DISCONTINUED] cyclobenzaprine (FLEXERIL) 5 MG tablet Take 1 tablet (5 mg total) by mouth 3 (three) times daily as needed for muscle spasms.  . [DISCONTINUED] meclizine (ANTIVERT) 12.5 MG tablet Take 25 mg by mouth every 6 (six) hours as needed for dizziness.  . [DISCONTINUED] omeprazole (PRILOSEC) 20 MG  capsule Take 1 capsule (20 mg total) by mouth daily.  . [DISCONTINUED] pantoprazole (PROTONIX) 40 MG tablet Take 40 mg by mouth daily.  . [DISCONTINUED] primidone (MYSOLINE) 50 MG tablet Take 1 tablet (50 mg total) by mouth at bedtime.  . [DISCONTINUED] traMADol (ULTRAM) 50 MG tablet Take 50 mg by mouth as needed. For neck pain  . [DISCONTINUED] valACYclovir (VALTREX) 500 MG tablet Take 1 tablet (500 mg total) by mouth daily as needed (for sores).   Social History   Tobacco Use  . Smoking status: Former Smoker    Packs/day: 0.10    Years: 3.00    Pack years: 0.30    Types: Cigarettes    Last attempt to quit: 08/15/1975    Years since quitting: 42.7  . Smokeless tobacco: Never Used  . Tobacco comment: quit 35 years ago  Substance Use Topics  . Alcohol use: No   Family History  Problem Relation Age of Onset  . Achalasia Father   . Depression Father   . Heart disease Brother 52       heavy smoker-died of MI  . Kidney failure Mother 10  . Diabetes Sister      Review of Systems  Constitutional: Negative for chills, fatigue and fever.  HENT: Positive for congestion, sinus pressure and sinus pain (frontal-maxillary). Negative for ear pain and sore throat.   Respiratory: Negative for cough, chest tightness, shortness of breath and wheezing.   Cardiovascular: Negative for chest pain, palpitations and leg swelling.    Objective:  BP 140/80 (BP Location: Left Arm, Patient Position: Sitting, Cuff Size: Normal)   Pulse 91   Temp 98.4 F (36.9 C) (Oral)   Ht 5\' 8"  (1.727 m)   Wt 202 lb 11.2 oz (91.9 kg)   SpO2 96%   BMI 30.82 kg/m   Weight: 202 lb 11.2 oz (91.9 kg)   BP Readings from Last 3 Encounters:  05/07/18 140/80  07/05/14 (!) 140/92  04/13/14 126/84   Wt Readings from Last 3 Encounters:  05/07/18 202 lb 11.2 oz (91.9 kg)  07/05/14 204 lb (92.5 kg)  04/13/14 200 lb (90.7 kg)    Physical Exam  Constitutional: He appears well-developed and well-nourished. No  distress.  HENT:  Head: Normocephalic and atraumatic.  Right Ear: Tympanic membrane, external ear and ear canal normal.  Left Ear: Tympanic membrane, external ear and ear canal normal.  Nose: Mucosal edema present. Right sinus exhibits maxillary sinus tenderness and frontal sinus tenderness. Left sinus exhibits maxillary sinus tenderness and frontal sinus tenderness.  Mouth/Throat: Uvula is midline and mucous membranes are normal. Posterior oropharyngeal erythema present.  Neck: Neck supple.  Cardiovascular: Normal rate and regular rhythm.  Pulmonary/Chest: Effort normal and breath sounds normal. No respiratory distress. He has no wheezes. He has no rhonchi. He has no rales.  Lymphadenopathy:    He has no cervical adenopathy.    Assessment/Plan:  1.  Hyperlipidemia, unspecified hyperlipidemia type  - Comprehensive metabolic panel; Future - Lipid panel; Future - Lipid panel - Comprehensive metabolic panel  2. Impaired fasting glucose  - Hemoglobin A1c; Future - Hemoglobin A1c  3. Sinusitis, unspecified chronicity, unspecified location  - doxycycline (VIBRA-TABS) 100 MG tablet; Take 1 tablet (100 mg total) by mouth 2 (two) times daily.  Dispense: 20 tablet; Refill: 0  4. Allergic rhinitis, unspecified seasonality, unspecified trigger Advised vasoline in nares at bedtime to help maintain moisture - fluticasone (FLONASE) 50 MCG/ACT nasal spray; Place 2 sprays into both nostrils daily.  Dispense: 16 g; Refill: 6  5. Need for pneumococcal vaccination  - Pneumococcal conjugate vaccine 13-valent IM  6. Encounter for immunization  - Flu vaccine HIGH DOSE PF  Return pending bloodwork results.  Micheline Rough, MD

## 2018-05-08 ENCOUNTER — Encounter: Payer: Self-pay | Admitting: Family Medicine

## 2018-05-18 ENCOUNTER — Other Ambulatory Visit: Payer: Self-pay | Admitting: Dermatology

## 2018-05-18 DIAGNOSIS — L820 Inflamed seborrheic keratosis: Secondary | ICD-10-CM | POA: Diagnosis not present

## 2018-05-18 DIAGNOSIS — D229 Melanocytic nevi, unspecified: Secondary | ICD-10-CM | POA: Diagnosis not present

## 2018-05-18 DIAGNOSIS — L570 Actinic keratosis: Secondary | ICD-10-CM | POA: Diagnosis not present

## 2018-05-18 DIAGNOSIS — L821 Other seborrheic keratosis: Secondary | ICD-10-CM | POA: Diagnosis not present

## 2018-05-18 DIAGNOSIS — D485 Neoplasm of uncertain behavior of skin: Secondary | ICD-10-CM | POA: Diagnosis not present

## 2018-05-26 ENCOUNTER — Telehealth: Payer: Self-pay

## 2018-05-26 NOTE — Telephone Encounter (Signed)
Received a fax from [facility] asking to initiate PA. PA has been sent to cover my meds.  Key: [id number]

## 2018-06-01 NOTE — Telephone Encounter (Signed)
PA was denied. Fax has been given to Dr. Barbie Banner assistant

## 2018-06-01 NOTE — Telephone Encounter (Signed)
Dr. Sarajane Jews --form was placed in the red folder for you to review and advise on a medication.  Thanks

## 2018-06-02 NOTE — Telephone Encounter (Signed)
Spoke with patient he states he will try to get the amitriptyline from [facility].

## 2018-06-02 NOTE — Telephone Encounter (Signed)
This is a patient of Dr. Koberlein  

## 2018-06-02 NOTE — Telephone Encounter (Signed)
This medication is still on the $4 list at [facility]. I would recommend just cash paying for it since this has been denied.

## 2018-06-02 NOTE — Telephone Encounter (Signed)
PA denial has been sent to Dr. Ethlyn Gallery

## 2018-06-07 ENCOUNTER — Encounter: Payer: Self-pay | Admitting: Family Medicine

## 2018-06-07 ENCOUNTER — Ambulatory Visit (INDEPENDENT_AMBULATORY_CARE_PROVIDER_SITE_OTHER): Payer: Medicare Other | Admitting: Family Medicine

## 2018-06-07 VITALS — BP 124/70 | HR 90 | Temp 98.3°F | Wt 200.4 lb

## 2018-06-07 DIAGNOSIS — M792 Neuralgia and neuritis, unspecified: Secondary | ICD-10-CM

## 2018-06-07 DIAGNOSIS — T753XXD Motion sickness, subsequent encounter: Secondary | ICD-10-CM | POA: Diagnosis not present

## 2018-06-07 MED ORDER — AMITRIPTYLINE HCL 50 MG PO TABS: 50.0000 mg | ORAL_TABLET | Freq: Every day | ORAL | 1 refills | Status: DC

## 2018-06-07 NOTE — Progress Notes (Signed)
Brysyn Ethelda Chick DOB: 13-Apr-1946 Encounter date: 06/07/2018  This is a 72 y.o. male who presents with Chief Complaint  Patient presents with  . Follow-up    finished doxy, no improvement, sinus headaches, complains of motion sickness when moving around the house,     History of present illness: Last seen on 11/8 and treated with doxycycline for sinus sx that were going on for over 6 weeks. Flonase and nasal saline. The doxy didn't help at all with the motion sickness. Getting sick with activities. Feels very nauseated. No vomiting, but does have to lay down and rest. No vertigo. Just gets in recliner.   Not feeling like congestion is any better. Still has some sinus pressure. Has seen ENT in the past.   Wondering about medication that may help with symptoms. Not taking meclizine every day. Not sure if it helps on the days he takes it. Used to take promethazine on days he used to take it.   At one time was put in the hospital for evaluation of this motion sickness. Did some evaluation with warm water into ears; made him very sick. 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 Went to neurosurgeon for this and was told that he was probably suffering more due to this and to inner ear issue.   Sx are worse in fall and spring. Seems like he gets better in summer and winter. Sudafed does help with symptoms but worries about taking it every day.    Amitriptyline does help him with sleep. Also has some neuropathic pain in left leg and uses it for this reason as well.   Allergies  Allergen Reactions  . Codeine Other (See Comments)    REACTION: Nausea   Current Meds  Medication Sig  . amitriptyline (ELAVIL) 50 MG tablet Take 1 tablet (50 mg total) by mouth at bedtime.  Marland Kitchen aspirin EC 81 MG EC tablet Take 1 tablet (81 mg total) by mouth daily.  . fluticasone (FLONASE) 50 MCG/ACT nasal spray Place 2 sprays into both nostrils daily.  . meclizine (ANTIVERT) 12.5 MG tablet Take 1-2 tablets (12.5-25 mg  total) by mouth every 6 (six) hours as needed for dizziness.  . pantoprazole (PROTONIX) 40 MG tablet Take 1 tablet (40 mg total) by mouth daily.  . Probiotic Product ([place]) CAPS Take 1 capsule by mouth daily.   . sodium chloride (OCEAN) 0.65 % SOLN nasal spray Place 2 sprays into both nostrils at bedtime.  . valACYclovir (VALTREX) 500 MG tablet Take 1 tablet (500 mg total) by mouth daily as needed (for sores).  . [DISCONTINUED] amitriptyline (ELAVIL) 50 MG tablet Take 1 tablet (50 mg total) by mouth at bedtime.  . [DISCONTINUED] doxycycline (VIBRA-TABS) 100 MG tablet Take 1 tablet (100 mg total) by mouth 2 (two) times daily.    Review of Systems  Constitutional: Negative for chills, fatigue and fever.  HENT: Positive for congestion (comes and goes) and sinus pressure. Negative for ear pain, sinus pain and sore throat.   Respiratory: Negative for cough, chest tightness, shortness of breath and wheezing.   Cardiovascular: Negative for chest pain, palpitations and leg swelling.  Gastrointestinal: Positive for nausea. Negative for abdominal pain and vomiting.  Neurological: Positive for numbness (left leg pain; neuropathic. Has been present for years). Negative for dizziness, syncope, light-headedness and headaches.    Objective:  BP 124/70 (BP Location: Left Arm, Patient Position: Sitting, Cuff Size: Normal)   Pulse 90   Temp 98.3 F (36.8  C) (Oral)   Wt 200 lb 6.4 oz (90.9 kg)   SpO2 96%   BMI 30.47 kg/m   Weight: 200 lb 6.4 oz (90.9 kg)   BP Readings from Last 3 Encounters:  06/07/18 124/70  05/07/18 140/80  07/05/14 (!) 140/92   Wt Readings from Last 3 Encounters:  06/07/18 200 lb 6.4 oz (90.9 kg)  05/07/18 202 lb 11.2 oz (91.9 kg)  07/05/14 204 lb (92.5 kg)    Physical Exam  Constitutional: He is oriented to person, place, and time. He appears well-developed and well-nourished. No distress.  HENT:  Head: Normocephalic and atraumatic.  Right Ear: External  ear and ear canal normal. Tympanic membrane is scarred. Tympanic membrane is not injected, not perforated and not erythematous.  Left Ear: External ear and ear canal normal. Tympanic membrane is not injected, not perforated and not erythematous.  Nose: Mucosal edema present. Right sinus exhibits no maxillary sinus tenderness and no frontal sinus tenderness. Left sinus exhibits no maxillary sinus tenderness and no frontal sinus tenderness.  Mouth/Throat: Uvula is midline. Posterior oropharyngeal erythema present.  Eyes: Pupils are equal, round, and reactive to light. Conjunctivae, EOM and lids are normal.  Cardiovascular: Normal rate, regular rhythm and normal heart sounds. Exam reveals no friction rub.  No murmur heard. No lower extremity edema  Pulmonary/Chest: Effort normal and breath sounds normal. No respiratory distress. He has no wheezes. He has no rales.  Neurological: He is alert and oriented to person, place, and time.  Psychiatric: His behavior is normal. Cognition and memory are normal.    Assessment/Plan 1. Neuropathic pain, leg, left Insurance denied coverage for this medication because it was linked to a diagnosis that was not approved via FDA for use.  I will submit an appeal for this, but in the meanwhile prescription was printed for patient to take to the pharmacy in an effort to get better pricing on medication. - amitriptyline (ELAVIL) 50 MG tablet; Take 1 tablet (50 mg total) by mouth at bedtime.  Dispense: 90 tablet; Refill: 1  2. Motion sickness Recommended trying meclizine on a daily basis.  He does get some benefit from this when he takes intermittently.  Motion sickness is been an ongoing problem for him for over 30 years, and it is not necessarily worse at this point usually it is better by this time of year.  He does not wish to have a specialty intervention if possible since he has been through testing and imaging in the past.  However, if meclizine is not helping with  his daily symptoms, would consider specialty referral.  I will also plan to read through his former office visits, specialty visits, and imaging as available.  If any pertinent information is revealed through this review I will added to his chart.  He is to update me in 2 weeks time to see if symptoms are better with the daily meclizine use.   Return pending update in 2 weeks time.    Micheline Rough, MD

## 2018-06-07 NOTE — Patient Instructions (Signed)
Meclizine daily. Update me in 2 weeks time on symptoms.   If you do not hear from insurance/pharmacy about approval for the amitriptyline then let me know.

## 2018-06-09 NOTE — Telephone Encounter (Signed)
received a fax from [place] stating that appeal for Amitriptyline has been approved.

## 2018-06-09 NOTE — Telephone Encounter (Signed)
Patient is aware 

## 2018-06-09 NOTE — Telephone Encounter (Signed)
Noted. You can let him know- not sure which will be cheaper way to go now!

## 2018-06-14 ENCOUNTER — Telehealth: Payer: Self-pay | Admitting: Family Medicine

## 2018-06-14 NOTE — Telephone Encounter (Signed)
I told [name] I would review his chart regarding the dizziness an prior work up. So I am limited to the extent that I can go back into the electronic record. A couple of concerns/questions for him:  *did he ever have repeat CT chest after the noted pulmonary nodule in 2015? This was found when he had a pneumonia and was recommended to have a follow up. I don't see that he was re-imaged although it could have been at another location.  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 Looks like he tried some treatment. Curious if tremor has worsened or been stable since that time?  *He has two images of head in the chart - an MRI from 2010 that showed some sinus congestion but nothing else and then a CT head from 01/2014 that was normal. I do not see evidence of the acoustic neuroma he mentioned. I also, unfortunately, cannot see other specialty notes (like ENT?) or other related testing that he had in the past. 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 So let me know how the meclizine is working for him. Without more info and data from past testing, I would suggest ENT referral if sx are worsening.

## 2018-06-14 NOTE — Telephone Encounter (Signed)
Spoke with patient he stated he has not had a repeat CT of the chest. His tremor seems to be worse but he believes  this could be due to the dizziness. Symptoms related to the Acoustic neuroma have been stable. Meclizine does seem to be helping. Patient is aware to call if symptoms worsen to have referral placed.

## 2018-06-16 ENCOUNTER — Other Ambulatory Visit: Payer: Self-pay | Admitting: Family Medicine

## 2018-06-16 ENCOUNTER — Other Ambulatory Visit: Payer: Self-pay | Admitting: Dermatology

## 2018-06-16 DIAGNOSIS — C44229 Squamous cell carcinoma of skin of left ear and external auricular canal: Secondary | ICD-10-CM | POA: Diagnosis not present

## 2018-06-16 NOTE — Telephone Encounter (Signed)
Glad to hear that the meclizine may be helping!  Would suggest CT chest with contrast for follow up on pulmonary nodule. This is not urgent but please order and he can schedule at convenience.

## 2018-06-16 NOTE — Telephone Encounter (Signed)
Spoke with patient. He has agreed to Chest CT.

## 2018-06-18 ENCOUNTER — Other Ambulatory Visit: Payer: Self-pay | Admitting: Family Medicine

## 2018-06-18 DIAGNOSIS — M25512 Pain in left shoulder: Secondary | ICD-10-CM | POA: Diagnosis not present

## 2018-06-18 DIAGNOSIS — R911 Solitary pulmonary nodule: Secondary | ICD-10-CM

## 2018-06-18 DIAGNOSIS — M47812 Spondylosis without myelopathy or radiculopathy, cervical region: Secondary | ICD-10-CM | POA: Diagnosis not present

## 2018-06-18 NOTE — Telephone Encounter (Signed)
So I have tried to order this and it is coming up looking like it is not covered under insurance. I am trying to order a low dose CT chest without contrast for follow up on a known pulmonary nodule. This is the recommended follow up study. Could you perhaps touch base with Neoma Laming since she gets these approved when she is in and see if there is a particular imaging number or something that it is supposed to be ordered under? If still coverage issue we can always have him evaluated at pulm nodule clinic and let them order appropriate study.

## 2018-06-30 HISTORY — PX: SKIN CANCER EXCISION: SHX779

## 2018-07-08 NOTE — Telephone Encounter (Signed)
Did you get my last message? I was having trouble completing order as well.

## 2018-07-09 NOTE — Telephone Encounter (Signed)
Spoke with Neoma Laming. She stated that these scans are done at [facility], and that she would call them.  Order code is: [id number]

## 2018-07-09 NOTE — Telephone Encounter (Signed)
Ok thanks 

## 2018-07-12 DIAGNOSIS — C44229 Squamous cell carcinoma of skin of left ear and external auricular canal: Secondary | ICD-10-CM | POA: Diagnosis not present

## 2018-07-16 ENCOUNTER — Encounter: Payer: Self-pay | Admitting: Family Medicine

## 2018-07-16 ENCOUNTER — Ambulatory Visit (INDEPENDENT_AMBULATORY_CARE_PROVIDER_SITE_OTHER): Payer: Medicare Other | Admitting: Family Medicine

## 2018-07-16 VITALS — BP 140/80 | HR 85 | Temp 98.6°F | Wt 198.5 lb

## 2018-07-16 DIAGNOSIS — R51 Headache: Secondary | ICD-10-CM | POA: Diagnosis not present

## 2018-07-16 DIAGNOSIS — R42 Dizziness and giddiness: Secondary | ICD-10-CM

## 2018-07-16 DIAGNOSIS — G629 Polyneuropathy, unspecified: Secondary | ICD-10-CM

## 2018-07-16 DIAGNOSIS — R5383 Other fatigue: Secondary | ICD-10-CM | POA: Diagnosis not present

## 2018-07-16 DIAGNOSIS — E538 Deficiency of other specified B group vitamins: Secondary | ICD-10-CM | POA: Diagnosis not present

## 2018-07-16 DIAGNOSIS — R519 Headache, unspecified: Secondary | ICD-10-CM

## 2018-07-16 LAB — CBC WITH DIFFERENTIAL/PLATELET
Basophils Absolute: 0 10*3/uL (ref 0.0–0.1)
Basophils Relative: 0.4 % (ref 0.0–3.0)
EOS PCT: 0.7 % (ref 0.0–5.0)
Eosinophils Absolute: 0.1 10*3/uL (ref 0.0–0.7)
HCT: 48.5 % (ref 39.0–52.0)
Hemoglobin: 16.4 g/dL (ref 13.0–17.0)
Lymphocytes Relative: 18.2 % (ref 12.0–46.0)
Lymphs Abs: 2.3 10*3/uL (ref 0.7–4.0)
MCHC: 33.8 g/dL (ref 30.0–36.0)
MCV: 93.9 fl (ref 78.0–100.0)
Monocytes Absolute: 1 10*3/uL (ref 0.1–1.0)
Monocytes Relative: 7.9 % (ref 3.0–12.0)
NEUTROS ABS: 9.4 10*3/uL — AB (ref 1.4–7.7)
Neutrophils Relative %: 72.8 % (ref 43.0–77.0)
Platelets: 230 10*3/uL (ref 150.0–400.0)
RBC: 5.16 Mil/uL (ref 4.22–5.81)
RDW: 13.7 % (ref 11.5–15.5)
WBC: 12.9 10*3/uL — ABNORMAL HIGH (ref 4.0–10.5)

## 2018-07-16 LAB — TSH: TSH: 2.04 u[IU]/mL (ref 0.35–4.50)

## 2018-07-16 LAB — VITAMIN B12: Vitamin B-12: 207 pg/mL — ABNORMAL LOW (ref 211–911)

## 2018-07-16 NOTE — Patient Instructions (Signed)
Trial of 1/2 tablet of amitriptyline at bedtime. Do this for 5 days and then decrease to every other day for 3 doses and then stop.   Trial of trazodone at bedtime.

## 2018-07-16 NOTE — Progress Notes (Signed)
[name] DOB: 02/08/1946 Encounter date: 07/16/2018  This is a 73 y.o. male who presents with Chief Complaint  Patient presents with  . sinus pressure    pt complains of headaches x 3 month, sinus issues have not gotten any better, nausea during the day    History of present illness: Had a place come up in ear and growing quickly in a couple of weeks. 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   He is worried about headaches, dizziness that are continued. The sinus issues and headaches have continued for months. Still doing the flonase, meclizine, sudafed and not noting relief in sinuses. Wondering if he needs imaging of head at this point or should see specialist.  At night can hear some "rattling" within the sinuses. Even with trying to use the nasal saline, just feels he isn't able to improve with this. Knows he has deviated septum. Can breathe well through both nares. Left side of nose still with nose bleeds; this has improved with vasoline.   Still with ringing in ears (chronic) and some loss.   Neuropathy feet 2-3 years. Feels that feet are frequently cold; feels like this is worsening for him.    Has been taking amitriptyline for years; wondering if there is something else he can take for sleep.   Allergies  Allergen Reactions  . Codeine Other (See Comments)    REACTION: Nausea   Current Meds  Medication Sig  . amitriptyline (ELAVIL) 50 MG tablet Take 1 tablet (50 mg total) by mouth at bedtime.  Marland Kitchen aspirin EC 81 MG EC tablet Take 1 tablet (81 mg total) by mouth daily.  . fluticasone (FLONASE) 50 MCG/ACT nasal spray Place 2 sprays into both nostrils daily.  . meclizine (ANTIVERT) 12.5 MG tablet Take 1-2 tablets (12.5-25 mg total) by mouth every 6 (six) hours as needed for dizziness.  . pantoprazole (PROTONIX) 40 MG tablet Take 1 tablet (40 mg total) by mouth daily.  . Probiotic Product ([place]) CAPS Take 1 capsule by mouth daily.   . sodium chloride (OCEAN) 0.65 % SOLN  nasal spray Place 2 sprays into both nostrils at bedtime.  . valACYclovir (VALTREX) 500 MG tablet Take 1 tablet (500 mg total) by mouth daily as needed (for sores).    Review of Systems  Constitutional: Negative for chills, fatigue and fever.  HENT: Positive for congestion and sinus pressure. Negative for sore throat.   Respiratory: Negative for cough, chest tightness, shortness of breath and wheezing.   Cardiovascular: Negative for chest pain, palpitations and leg swelling.  Neurological: Positive for light-headedness, numbness (feet; worse on left) and headaches. Negative for weakness.    Objective:  BP 140/80 (BP Location: Left Arm, Patient Position: Sitting, Cuff Size: Normal)   Pulse 85   Temp 98.6 F (37 C) (Oral)   Wt 198 lb 8 oz (90 kg)   SpO2 98%   BMI 30.18 kg/m   Weight: 198 lb 8 oz (90 kg)   BP Readings from Last 3 Encounters:  07/16/18 140/80  06/07/18 124/70  05/07/18 140/80   Wt Readings from Last 3 Encounters:  07/16/18 198 lb 8 oz (90 kg)  06/07/18 200 lb 6.4 oz (90.9 kg)  05/07/18 202 lb 11.2 oz (91.9 kg)    Physical Exam Constitutional:      General: He is not in acute distress.    Appearance: He is well-developed.  Cardiovascular:     Rate and Rhythm: Normal rate and regular rhythm.  Heart sounds: Normal heart sounds. No murmur. No friction rub.  Pulmonary:     Effort: Pulmonary effort is normal. No respiratory distress.     Breath sounds: Normal breath sounds. No wheezing or rales.  Musculoskeletal:     Right lower leg: No edema.     Left lower leg: No edema.  Neurological:     Mental Status: He is alert and oriented to person, place, and time.     Deep Tendon Reflexes:     Reflex Scores:      Bicep reflexes are 2+ on the right side and 2+ on the left side.      Brachioradialis reflexes are 2+ on the right side and 2+ on the left side.      Patellar reflexes are 2+ on the right side and 2+ on the left side.      Achilles reflexes are 2+ on  the right side and 2+ on the left side.    Comments: There is slight decreased sensation to left mid foot/heel w monofilament  Psychiatric:        Behavior: Behavior normal.     Assessment/Plan  1. Dizziness - Ambulatory referral to ENT - CBC with Differential/Platelet; Future - Vitamin B12; Future  2. Acute nonintractable headache, unspecified headache type Pending appointment with/eval with ENT would consider repeat CT head for further evaluation due to persistent sinus pressure, headache without response to treatment. - Ambulatory referral to ENT  3. Neuropathy This is chronic, but I do not see that B12 has been checked in recent years; we will check today and follow up pending results. Has some recollection of possible deficiency history. - Vitamin B12; Future  4. Fatigue, unspecified type - TSH; Future  5. B12 deficiency See above - Vitamin B12; Future   Return pending lab results.     Micheline Rough, MD

## 2018-07-23 DIAGNOSIS — L570 Actinic keratosis: Secondary | ICD-10-CM | POA: Diagnosis not present

## 2018-07-23 DIAGNOSIS — L814 Other melanin hyperpigmentation: Secondary | ICD-10-CM | POA: Diagnosis not present

## 2018-07-23 DIAGNOSIS — D1801 Hemangioma of skin and subcutaneous tissue: Secondary | ICD-10-CM | POA: Diagnosis not present

## 2018-07-23 DIAGNOSIS — D225 Melanocytic nevi of trunk: Secondary | ICD-10-CM | POA: Diagnosis not present

## 2018-07-23 DIAGNOSIS — L821 Other seborrheic keratosis: Secondary | ICD-10-CM | POA: Diagnosis not present

## 2018-07-23 DIAGNOSIS — Z85828 Personal history of other malignant neoplasm of skin: Secondary | ICD-10-CM | POA: Diagnosis not present

## 2018-07-23 DIAGNOSIS — D485 Neoplasm of uncertain behavior of skin: Secondary | ICD-10-CM | POA: Diagnosis not present

## 2018-07-27 DIAGNOSIS — R110 Nausea: Secondary | ICD-10-CM | POA: Diagnosis not present

## 2018-07-27 DIAGNOSIS — R51 Headache: Secondary | ICD-10-CM | POA: Diagnosis not present

## 2018-07-27 DIAGNOSIS — R42 Dizziness and giddiness: Secondary | ICD-10-CM | POA: Diagnosis not present

## 2018-07-28 ENCOUNTER — Other Ambulatory Visit: Payer: Self-pay | Admitting: Family Medicine

## 2018-07-28 DIAGNOSIS — D72829 Elevated white blood cell count, unspecified: Secondary | ICD-10-CM

## 2018-08-02 ENCOUNTER — Telehealth: Payer: Self-pay | Admitting: Family

## 2018-08-02 NOTE — Telephone Encounter (Signed)
Spoke with patient to confirm new patient appointment 08/17/18 at 130 pm. appt letter mailed as well

## 2018-08-03 ENCOUNTER — Ambulatory Visit (INDEPENDENT_AMBULATORY_CARE_PROVIDER_SITE_OTHER): Payer: Medicare Other | Admitting: *Deleted

## 2018-08-03 DIAGNOSIS — E538 Deficiency of other specified B group vitamins: Secondary | ICD-10-CM

## 2018-08-03 DIAGNOSIS — R51 Headache: Secondary | ICD-10-CM | POA: Diagnosis not present

## 2018-08-03 MED ORDER — CYANOCOBALAMIN 1000 MCG/ML IJ SOLN
1000.0000 ug | Freq: Once | INTRAMUSCULAR | Status: AC
  Administered 2018-08-03: 1000 ug via INTRAMUSCULAR

## 2018-08-03 NOTE — Progress Notes (Signed)
Per orders of Dr. Maudie Mercury (Dr Ethlyn Gallery), injection of Vit B12 given by Virl Cagey. Patient tolerated injection well.

## 2018-08-10 ENCOUNTER — Encounter: Payer: Self-pay | Admitting: *Deleted

## 2018-08-11 ENCOUNTER — Encounter: Payer: Self-pay | Admitting: Diagnostic Neuroimaging

## 2018-08-11 ENCOUNTER — Ambulatory Visit (INDEPENDENT_AMBULATORY_CARE_PROVIDER_SITE_OTHER): Payer: Medicare Other | Admitting: Diagnostic Neuroimaging

## 2018-08-11 DIAGNOSIS — H814 Vertigo of central origin: Secondary | ICD-10-CM

## 2018-08-11 DIAGNOSIS — G4489 Other headache syndrome: Secondary | ICD-10-CM | POA: Diagnosis not present

## 2018-08-11 DIAGNOSIS — G450 Vertebro-basilar artery syndrome: Secondary | ICD-10-CM | POA: Diagnosis not present

## 2018-08-11 NOTE — Progress Notes (Signed)
[facility]  PATIENT: [name] DOB: 1946-01-02  REFERRING CLINICIAN: L Sallee Provencal HISTORY FROM: patient  REASON FOR VISIT: new consult    HISTORICAL  CHIEF COMPLAINT:  Chief Complaint  Patient presents with  . Headache    rm 7, New Pt, "headaches x 3 months, saw ENT, had CT scan-neg; tremors are worse, toe burning; dizziness"    HISTORY OF PRESENT ILLNESS:   73 year old male here for evaluation of headache.  Patient having pain in the left greater than right side of the head, with sharp and aching sensation, nausea, sweating, photophobia, photophobia.  No vision changes.  Symptoms worse when he stands up or exerts himself.  Nausea symptoms are quite bothersome and disabling.  In the 1990s patient was diagnosed with right acoustic neuroma, monitored with MRI, and interestingly follow-up MRIs demonstrated spontaneous resolution/disappearance of this lesion.  No family history of migraine.  No prior history of migraine himself.  Patient has had unexplained leukocytosis for several years.  Patient has been diagnosed with B12 deficiency.  Patient has longstanding motion sickness.  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   REVIEW OF SYSTEMS: Full 14 system review of systems performed and negative with exception of: Dizziness headache weakness tremor decreased energy allergies joint pain impotence fatigue.   ALLERGIES: Allergies  Allergen Reactions  . Codeine Other (See Comments)    REACTION: Nausea    HOME MEDICATIONS: Outpatient Medications Prior to Visit  Medication Sig Dispense Refill  . amitriptyline (ELAVIL) 50 MG tablet Take 1 tablet (50 mg total) by mouth at bedtime. 90 tablet 1  . aspirin EC 81 MG EC tablet Take 1 tablet (81 mg total) by mouth daily.    . fluticasone (FLONASE) 50 MCG/ACT nasal spray Place 2 sprays into both nostrils daily. 16 g 6  . meclizine (ANTIVERT) 12.5 MG tablet Take 1-2 tablets  (12.5-25 mg total) by mouth every 6 (six) hours as needed for dizziness. 30 tablet 1  . pantoprazole (PROTONIX) 40 MG tablet Take 1 tablet (40 mg total) by mouth daily. 90 tablet 1  . Probiotic Product ([place]) CAPS Take 1 capsule by mouth daily.     . sodium chloride (OCEAN) 0.65 % SOLN nasal spray Place 2 sprays into both nostrils at bedtime.    . valACYclovir (VALTREX) 500 MG tablet Take 1 tablet (500 mg total) by mouth daily as needed (for sores). 30 tablet 5   No facility-administered medications prior to visit.     PAST MEDICAL HISTORY: Past Medical History:  Diagnosis Date  . ALLERGIC RHINITIS CAUSE UNSPECIFIED 05/27/2010  . BACK PAIN, UPPER 02/21/2010  . Cervicalgia 05/27/2010  . CHEST PAIN UNSPECIFIED 04/25/2009  . DIVERTICULOSIS, COLON 03/09/2007  . DIZZINESS, CHRONIC 03/09/2007  . GERD 12/24/2006  . HEADACHE, SINUS 04/06/2009  . HYPERLIPIDEMIA 12/24/2006   diet controlled  . HYPERTENSION 12/24/2006   better after weight loss  . INSOMNIA WITH SLEEP APNEA UNSPECIFIED 05/27/2010  . Motion sickness   . Neuropathy   . Open wound of wrist, complicated 02/25/9370  . OSTEOARTHRITIS 12/24/2006  . Other constipation 11/06/2009  . PONV (postoperative nausea and vomiting)   . PROSTATITIS, RECURRENT 12/30/2007  . SHOULDER PAIN, BILATERAL 07/16/2007  . Squamous cell carcinoma, ear, left   . TRANSIENT ISCHEMIC ATTACKS, HX OF 04/06/2009    PAST SURGICAL HISTORY: Past Surgical History:  Procedure Laterality Date  . ANKLE ARTHROSCOPY W/ OPEN REPAIR  2002   Repair  . COLONOSCOPY    .  CYST EXCISION     from back 2015. likely this was meaning of ear cyst  . EAR CYST EXCISION Bilateral 01/18/2013   Procedure: EXCISION CYSTS FROM BACK (TWO);  Surgeon: Earnstine Regal, MD;  Location: [facility];  Service: General;  Laterality: Bilateral;  . epidydmal cyst excision    . REPLACEMENT TOTAL KNEE BILATERAL  2005/2007  . SKIN CANCER EXCISION  2020   ear, squamous cell  .  TONSILLECTOMY      FAMILY HISTORY: Family History  Problem Relation Age of Onset  . Achalasia Father   . Depression Father   . Heart disease Brother 18       heavy smoker-died of MI  . Kidney failure Mother 50  . Diabetes Sister     SOCIAL HISTORY: Social History   Socioeconomic History  . Marital status: Married    Spouse name: Mardene Celeste  . Number of children: 1  . Years of education: 40  . Highest education level: Not on file  Occupational History  . 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 Financial resource strain: Not on file  . Food insecurity:    Worry: Not on file    Inability: Not on file  . Transportation needs:    Medical: Not on file    Non-medical: Not on file  Tobacco Use  . Smoking status: Never Smoker  . Smokeless tobacco: Never Used  . Tobacco comment: second hand smoke  Substance and Sexual Activity  . Alcohol use: No    Comment: quit 45 yrs ago  . Drug use: No  . Sexual activity: Yes  Lifestyle  . Physical activity:    Days per week: Not on file    Minutes per session: Not on file  . Stress: Not on file  Relationships  . Social connections:    Talks on phone: Not on file    Gets together: Not on file    Attends religious service: Not on file    Active member of club or organization: Not on file    Attends meetings of clubs or organizations: Not on file    Relationship status: Not on file  . Intimate partner violence:    Fear of current or ex partner: Not on file    Emotionally abused: Not on file    Physically abused: Not on file    Forced sexual activity: Not on file  Other Topics Concern  . Not on file  Social History Narrative   Married 1967. 1 girl. 2 grandkids.       Retired Conservation officer, historic buildings      Hobbies: Photographer, lake, boat   Caffeine 1 daily           PHYSICAL EXAM  GENERAL EXAM/CONSTITUTIONAL: Vitals:  Vitals:   08/11/18 1118  BP: (!) 143/83    Pulse: 93  Weight: 202 lb 12.8 oz (92 kg)  Height: 5\' 8"  (1.727 m)     Body mass index is 30.84 kg/m. Wt Readings from Last 3 Encounters:  08/11/18 202 lb 12.8 oz (92 kg)  07/16/18 198 lb 8 oz (90 kg)  06/07/18 200 lb 6.4 oz (90.9 kg)     Patient is in no distress; well developed, nourished and groomed; neck is supple  CARDIOVASCULAR:  Examination of carotid arteries is normal; no carotid bruits  Regular rate and rhythm, no murmurs  Examination of peripheral vascular system by  observation and palpation is normal  EYES:  Ophthalmoscopic exam of optic discs and posterior segments is normal; no papilledema or hemorrhages  Visual Acuity Screening   Right eye Left eye Both eyes  Without correction: 20/40 20/40   With correction:        MUSCULOSKELETAL:  Gait, strength, tone, movements noted in Neurologic exam below  NEUROLOGIC: MENTAL STATUS:  No flowsheet data found.  awake, alert, oriented to person, place and time  recent and remote memory intact  normal attention and concentration  language fluent, comprehension intact, naming intact  fund of knowledge appropriate  CRANIAL NERVE:   2nd - no papilledema on fundoscopic exam  2nd, 3rd, 4th, 6th - pupils equal and reactive to light, visual fields full to confrontation, extraocular muscles intact, no nystagmus  5th - facial sensation symmetric  7th - facial strength symmetric  8th - hearing intact  9th - palate elevates symmetrically, uvula midline  11th - shoulder shrug symmetric  12th - tongue protrusion midline  MOTOR:   POSTURAL AND ACTION TREMOR IN LUE > RUE  MILD HEAD TREMOR  normal bulk and tone, full strength in the BUE, BLE  SENSORY:   normal and symmetric to light touch  COORDINATION:   finger-nose-finger, fine finger movements normal  REFLEXES:   deep tendon reflexes present and symmetric  GAIT/STATION:   narrow based gait     DIAGNOSTIC DATA (LABS, IMAGING,  TESTING) - I reviewed patient records, labs, notes, testing and imaging myself where available.  Lab Results  Component Value Date   WBC 12.9 (H) 07/16/2018   HGB 16.4 07/16/2018   HCT 48.5 07/16/2018   MCV 93.9 07/16/2018   PLT 230.0 07/16/2018      Component Value Date/Time   NA 137 05/07/2018 1524   K 3.8 05/07/2018 1524   CL 99 05/07/2018 1524   CO2 31 05/07/2018 1524   GLUCOSE 91 05/07/2018 1524   BUN 12 05/07/2018 1524   CREATININE 1.08 05/07/2018 1524   CALCIUM 9.5 05/07/2018 1524   PROT 6.9 05/07/2018 1524   ALBUMIN 4.4 05/07/2018 1524   AST 16 05/07/2018 1524   ALT 16 05/07/2018 1524   ALKPHOS 90 05/07/2018 1524   BILITOT 0.4 05/07/2018 1524   GFRNONAA 85 (L) 03/06/2014 0451   GFRAA >90 03/06/2014 0451   Lab Results  Component Value Date   CHOL 184 05/07/2018   HDL 46.30 05/07/2018   LDLDIRECT 113.0 05/07/2018   TRIG 247.0 (H) 05/07/2018   CHOLHDL 4 05/07/2018   Lab Results  Component Value Date   HGBA1C 6.4 05/07/2018   Lab Results  Component Value Date   VITAMINB12 207 (L) 07/16/2018   Lab Results  Component Value Date   TSH 2.04 07/16/2018    02/27/14 CT head  - No acute intracranial findings    ASSESSMENT AND PLAN  73 y.o. year old male here with positional and exertional headache, dizziness, nausea since December 2019.  We will proceed with further work-up.  Dx:  1. Other headache syndrome   2. Vertigo of central origin   3. Vertebro-basilar artery syndrome     PLAN:  POSITIONAL / EXERTIONAL HEADACHE AND DIZZINESS AND NAUSEA - MRI brain and IAC (eval for mass, stroke) - MRA head (eval for TIA)  B12 DEFICIENCY - continue B12 replacement   LEUKOCYTOSIS - follow up hematology consult  ESSENTIAL TREMOR - monitor symptoms; may consider primidone  LUNG NODULE - follow up CT chest per PCP  Orders Placed This  Encounter  Procedures  . MR BRAIN/IAC W WO CONTRAST  . MR MRA HEAD WO CONTRAST   Return in about 4 months (around  12/10/2018).    Penni Bombard, MD 9/92/3414, 43:60 PM Certified in Neurology, Neurophysiology and Neuroimaging  [facility] [address], [place],  [address] [phone]

## 2018-08-11 NOTE — Patient Instructions (Signed)
  POSITIONAL / EXERTIONAL HEADACHE AND DIZZINESS AND NAUSEA - MRI brain and IAC (eval for mass, stroke) - MRA head (eval for TIA)  ESSENTIAL TREMOR - monitor symptoms; may consider primidone  B12 DEFICIENCY - continue B12 replacement   LEUKOCYTOSIS - follow up hematology consult  LUNG NODULE - follow up CT chest per PCP

## 2018-08-12 ENCOUNTER — Encounter: Payer: Self-pay | Admitting: Diagnostic Neuroimaging

## 2018-08-12 ENCOUNTER — Telehealth: Payer: Self-pay | Admitting: Diagnostic Neuroimaging

## 2018-08-12 NOTE — Telephone Encounter (Signed)
Medicare/bcbs supp order sent to [facility]. No auth they will reach out to the pt to schedule.  °

## 2018-08-16 ENCOUNTER — Other Ambulatory Visit: Payer: Self-pay | Admitting: Family

## 2018-08-16 DIAGNOSIS — D72820 Lymphocytosis (symptomatic): Secondary | ICD-10-CM

## 2018-08-16 DIAGNOSIS — M792 Neuralgia and neuritis, unspecified: Secondary | ICD-10-CM

## 2018-08-17 ENCOUNTER — Ambulatory Visit: Payer: Medicare Other

## 2018-08-17 ENCOUNTER — Encounter: Payer: Self-pay | Admitting: Family

## 2018-08-17 ENCOUNTER — Inpatient Hospital Stay: Payer: Medicare Other

## 2018-08-17 ENCOUNTER — Inpatient Hospital Stay: Payer: Medicare Other | Attending: Family | Admitting: Family

## 2018-08-17 ENCOUNTER — Ambulatory Visit (INDEPENDENT_AMBULATORY_CARE_PROVIDER_SITE_OTHER): Payer: Medicare Other | Admitting: *Deleted

## 2018-08-17 DIAGNOSIS — D72820 Lymphocytosis (symptomatic): Secondary | ICD-10-CM

## 2018-08-17 DIAGNOSIS — Z79899 Other long term (current) drug therapy: Secondary | ICD-10-CM | POA: Insufficient documentation

## 2018-08-17 DIAGNOSIS — D471 Chronic myeloproliferative disease: Secondary | ICD-10-CM

## 2018-08-17 DIAGNOSIS — E785 Hyperlipidemia, unspecified: Secondary | ICD-10-CM | POA: Diagnosis not present

## 2018-08-17 DIAGNOSIS — R5383 Other fatigue: Secondary | ICD-10-CM | POA: Insufficient documentation

## 2018-08-17 DIAGNOSIS — Z7982 Long term (current) use of aspirin: Secondary | ICD-10-CM | POA: Insufficient documentation

## 2018-08-17 DIAGNOSIS — K219 Gastro-esophageal reflux disease without esophagitis: Secondary | ICD-10-CM | POA: Insufficient documentation

## 2018-08-17 DIAGNOSIS — R140 Abdominal distension (gaseous): Secondary | ICD-10-CM | POA: Diagnosis not present

## 2018-08-17 DIAGNOSIS — R251 Tremor, unspecified: Secondary | ICD-10-CM | POA: Insufficient documentation

## 2018-08-17 DIAGNOSIS — D72829 Elevated white blood cell count, unspecified: Secondary | ICD-10-CM | POA: Diagnosis not present

## 2018-08-17 DIAGNOSIS — R531 Weakness: Secondary | ICD-10-CM | POA: Insufficient documentation

## 2018-08-17 DIAGNOSIS — E538 Deficiency of other specified B group vitamins: Secondary | ICD-10-CM | POA: Insufficient documentation

## 2018-08-17 DIAGNOSIS — G629 Polyneuropathy, unspecified: Secondary | ICD-10-CM | POA: Insufficient documentation

## 2018-08-17 DIAGNOSIS — Z8673 Personal history of transient ischemic attack (TIA), and cerebral infarction without residual deficits: Secondary | ICD-10-CM | POA: Diagnosis not present

## 2018-08-17 DIAGNOSIS — I10 Essential (primary) hypertension: Secondary | ICD-10-CM | POA: Diagnosis not present

## 2018-08-17 DIAGNOSIS — M1990 Unspecified osteoarthritis, unspecified site: Secondary | ICD-10-CM | POA: Insufficient documentation

## 2018-08-17 DIAGNOSIS — Z8619 Personal history of other infectious and parasitic diseases: Secondary | ICD-10-CM

## 2018-08-17 DIAGNOSIS — G4700 Insomnia, unspecified: Secondary | ICD-10-CM | POA: Insufficient documentation

## 2018-08-17 DIAGNOSIS — M542 Cervicalgia: Secondary | ICD-10-CM | POA: Diagnosis not present

## 2018-08-17 DIAGNOSIS — R1084 Generalized abdominal pain: Secondary | ICD-10-CM

## 2018-08-17 LAB — CBC WITH DIFFERENTIAL (CANCER CENTER ONLY)
Abs Immature Granulocytes: 0.26 10*3/uL — ABNORMAL HIGH (ref 0.00–0.07)
Basophils Absolute: 0.1 10*3/uL (ref 0.0–0.1)
Basophils Relative: 1 %
Eosinophils Absolute: 0.1 10*3/uL (ref 0.0–0.5)
Eosinophils Relative: 1 %
HEMATOCRIT: 44.7 % (ref 39.0–52.0)
Hemoglobin: 15.6 g/dL (ref 13.0–17.0)
Immature Granulocytes: 2 %
Lymphocytes Relative: 21 %
Lymphs Abs: 2.9 10*3/uL (ref 0.7–4.0)
MCH: 31.9 pg (ref 26.0–34.0)
MCHC: 34.9 g/dL (ref 30.0–36.0)
MCV: 91.4 fL (ref 80.0–100.0)
Monocytes Absolute: 1.2 10*3/uL — ABNORMAL HIGH (ref 0.1–1.0)
Monocytes Relative: 9 %
Neutro Abs: 9.5 10*3/uL — ABNORMAL HIGH (ref 1.7–7.7)
Neutrophils Relative %: 66 %
Platelet Count: 236 10*3/uL (ref 150–400)
RBC: 4.89 MIL/uL (ref 4.22–5.81)
RDW: 13 % (ref 11.5–15.5)
WBC Count: 14.1 10*3/uL — ABNORMAL HIGH (ref 4.0–10.5)
nRBC: 0 % (ref 0.0–0.2)

## 2018-08-17 LAB — CMP (CANCER CENTER ONLY)
ALK PHOS: 83 U/L (ref 38–126)
ALT: 16 U/L (ref 0–44)
AST: 15 U/L (ref 15–41)
Albumin: 4.3 g/dL (ref 3.5–5.0)
Anion gap: 8 (ref 5–15)
BUN: 14 mg/dL (ref 8–23)
CO2: 28 mmol/L (ref 22–32)
Calcium: 9.6 mg/dL (ref 8.9–10.3)
Chloride: 102 mmol/L (ref 98–111)
Creatinine: 1.1 mg/dL (ref 0.61–1.24)
GFR, Est AFR Am: >60 mL/min (ref >60)
GFR, Estimated: >60 mL/min (ref >60)
Glucose, Bld: 133 mg/dL — ABNORMAL HIGH (ref 70–99)
Potassium: 4 mmol/L (ref 3.5–5.1)
SODIUM: 138 mmol/L (ref 135–145)
Total Bilirubin: 0.5 mg/dL (ref 0.3–1.2)
Total Protein: 6.6 g/dL (ref 6.5–8.1)

## 2018-08-17 LAB — SAVE SMEAR(SSMR), FOR PROVIDER SLIDE REVIEW

## 2018-08-17 MED ORDER — CYANOCOBALAMIN 1000 MCG/ML IJ SOLN
1000.0000 ug | Freq: Once | INTRAMUSCULAR | Status: AC
  Administered 2018-08-17: 1000 ug via INTRAMUSCULAR

## 2018-08-17 NOTE — Progress Notes (Signed)
Per orders of Dr. Maudie Mercury (Dr Ethlyn Gallery not available), injection of Vit B12 given by Nyoka Cowden, Talecia Sherlin M. Patient tolerated injection well.

## 2018-08-17 NOTE — Progress Notes (Signed)
Hematology/Oncology Consultation   Name: [name]      MRN: [id number]    Location: Room/bed info not found  Date: 08/17/2018 Time:4:39 PM   REFERRING PHYSICIAN: Micheline Rough, MD  REASON FOR CONSULT: Leukocytosis    DIAGNOSIS: Leukocytosis   HISTORY OF PRESENT ILLNESS: [name] is a very pleasant 73 yo caucasian gentleman with several years history of leukocytosis.  He states that in the 1990's his PCP though he had leukemia and sent him to [facility] where he states that ruled that out and said he did not have it.  He states that prior to moving back here from [place], [place] his GI specialist told him he has bacteria in his stomach which he believes was H pylori. He will have his old records faxed to [place] this week.   He is symptomatic with abdominal discomfort, bloating/burning, belching, nausea (no vomiting) and having to graze every 2 hours to eat for the last 2-3 months.  He is on Protonix daily for GERD.  He has chronic sinusitis with headache and states that he has been on antibiotics multiple times in the past.  He has history of vertigo with dizziness and vertigo that last for a day or so. He states that he will go to bed with these episodes and rest until it resolves. He was given Dr. Suzzette Righter with ENTs contact info for further work up and treatment if needed.  He has a B 12 deficiency and started injections 2 weeks ago. He received an injection today.  He states that he is fatigued and feels weak at times.  He has been quite busy remodeling their new home.  He has had a benign tremor of the arms and hands for the last 4-5 years. He states that this is worse in the left hand.  He had a squamous cell carcinoma removed from his left ear 5 weeks ago and this appears to be healing nicely.  He has had knee replacements on both knees without any complications.  No fever, chills, cough, rash, SOB, chest pain, palpitations or changes in bowel or bladder habits.  He states that he  had a stress test last year prior to moving and it was negative.  He states that he had history of acoustic neuroma but that several years ago a scan showed this had resolved without intervention. He is scheduled for a follow-up MRI of the brain on 08/26/2018. His mother had history of a benign brain tumor. He thinks that his mother also had history of breast cancer.  He had a brother with Buerger's disease.  No swelling or tenderness in his extremities at this time.  He has neuropathy in his feet. He has tried taking gabapentin in the past but states this made him sick.  No falls or syncopal episodes to report.  No lymphadenopathy noted on exam.  He has never been a smoker. He stopped drinking alcoholic beverages 40 years ago. He does not use recreational drugs.   ROS: All other 10 point review of systems is negative.   PAST MEDICAL HISTORY:   Past Medical History:  Diagnosis Date  . ALLERGIC RHINITIS CAUSE UNSPECIFIED 05/27/2010  . BACK PAIN, UPPER 02/21/2010  . Cervicalgia 05/27/2010  . CHEST PAIN UNSPECIFIED 04/25/2009  . DIVERTICULOSIS, COLON 03/09/2007  . DIZZINESS, CHRONIC 03/09/2007  . GERD 12/24/2006  . HEADACHE, SINUS 04/06/2009  . HYPERLIPIDEMIA 12/24/2006   diet controlled  . HYPERTENSION 12/24/2006   better after weight loss  . INSOMNIA  WITH SLEEP APNEA UNSPECIFIED 05/27/2010  . Motion sickness   . Neuropathy   . Open wound of wrist, complicated 9/62/2297  . OSTEOARTHRITIS 12/24/2006  . Other constipation 11/06/2009  . PONV (postoperative nausea and vomiting)   . PROSTATITIS, RECURRENT 12/30/2007  . SHOULDER PAIN, BILATERAL 07/16/2007  . Squamous cell carcinoma, ear, left   . TRANSIENT ISCHEMIC ATTACKS, HX OF 04/06/2009    ALLERGIES: Allergies  Allergen Reactions  . Codeine Other (See Comments)    REACTION: Nausea      MEDICATIONS:  Current Outpatient Medications on File Prior to Visit  Medication Sig Dispense Refill  . amitriptyline (ELAVIL) 50 MG tablet Take 1 tablet  (50 mg total) by mouth at bedtime. 90 tablet 1  . aspirin EC 81 MG EC tablet Take 1 tablet (81 mg total) by mouth daily.    . fluticasone (FLONASE) 50 MCG/ACT nasal spray Place 2 sprays into both nostrils daily. 16 g 6  . meclizine (ANTIVERT) 12.5 MG tablet Take 1-2 tablets (12.5-25 mg total) by mouth every 6 (six) hours as needed for dizziness. 30 tablet 1  . pantoprazole (PROTONIX) 40 MG tablet Take 1 tablet (40 mg total) by mouth daily. 90 tablet 1  . Probiotic Product ([place]) CAPS Take 1 capsule by mouth daily.     . sodium chloride (OCEAN) 0.65 % SOLN nasal spray Place 2 sprays into both nostrils at bedtime.     No current facility-administered medications on file prior to visit.      PAST SURGICAL HISTORY Past Surgical History:  Procedure Laterality Date  . ANKLE ARTHROSCOPY W/ OPEN REPAIR  2002   Repair  . COLONOSCOPY    . CYST EXCISION     from back 2015. likely this was meaning of ear cyst  . EAR CYST EXCISION Bilateral 01/18/2013   Procedure: EXCISION CYSTS FROM BACK (TWO);  Surgeon: Earnstine Regal, MD;  Location: [facility];  Service: General;  Laterality: Bilateral;  . epidydmal cyst excision    . REPLACEMENT TOTAL KNEE BILATERAL  2005/2007  . SKIN CANCER EXCISION  2020   ear, squamous cell  . TONSILLECTOMY      FAMILY HISTORY: Family History  Problem Relation Age of Onset  . Achalasia Father   . Depression Father   . Heart disease Brother 68       heavy smoker-died of MI  . Kidney failure Mother 62  . Diabetes Sister     SOCIAL HISTORY:  reports that he has never smoked. He has never used smokeless tobacco. He reports that he does not drink alcohol or use drugs.  PERFORMANCE STATUS: The patient's performance status is 1 - Symptomatic but completely ambulatory  PHYSICAL EXAM: Most Recent Vital Signs: There were no vitals taken for this visit. There were no vitals taken for this visit.  General Appearance:    Alert, cooperative,  no distress, appears stated age  Head:    Normocephalic, without obvious abnormality, atraumatic  Eyes:    PERRL, conjunctiva/corneas clear, EOM's intact, fundi    benign, both eyes             Throat:   Lips, mucosa, and tongue normal; teeth and gums normal  Neck:   Supple, symmetrical, trachea midline, no adenopathy;       thyroid:  No enlargement/tenderness/nodules; no carotid   bruit or JVD  Back:     Symmetric, no curvature, ROM normal, no CVA tenderness  Lungs:     Clear  to auscultation bilaterally, respirations unlabored  Chest wall:    No tenderness or deformity  Heart:    Regular rate and rhythm, S1 and S2 normal, no murmur, rub   or gallop  Abdomen:     Soft, non-tender, bowel sounds active all four quadrants,    no masses, no organomegaly        Extremities:   Extremities normal, atraumatic, no cyanosis or edema  Pulses:   2+ and symmetric all extremities  Skin:   Skin color, texture, turgor normal, no rashes or lesions  Lymph nodes:   Cervical, supraclavicular, and axillary nodes normal  Neurologic:   CNII-XII intact. Normal strength, sensation and reflexes      throughout    LABORATORY DATA:  Results for orders placed or performed in visit on 08/17/18 (from the past 48 hour(s))  CBC with Differential ([place] Only)     Status: Abnormal   Collection Time: 08/17/18  1:26 PM  Result Value Ref Range   WBC Count 14.1 (H) 4.0 - 10.5 K/uL   RBC 4.89 4.22 - 5.81 MIL/uL   Hemoglobin 15.6 13.0 - 17.0 g/dL   HCT 44.7 39.0 - 52.0 %   MCV 91.4 80.0 - 100.0 fL   MCH 31.9 26.0 - 34.0 pg   MCHC 34.9 30.0 - 36.0 g/dL   RDW 13.0 11.5 - 15.5 %   Platelet Count 236 150 - 400 K/uL   nRBC 0.0 0.0 - 0.2 %   Neutrophils Relative % 66 %   Neutro Abs 9.5 (H) 1.7 - 7.7 K/uL   Lymphocytes Relative 21 %   Lymphs Abs 2.9 0.7 - 4.0 K/uL   Monocytes Relative 9 %   Monocytes Absolute 1.2 (H) 0.1 - 1.0 K/uL   Eosinophils Relative 1 %   Eosinophils Absolute 0.1 0.0 - 0.5 K/uL    Basophils Relative 1 %   Basophils Absolute 0.1 0.0 - 0.1 K/uL   Immature Granulocytes 2 %   Abs Immature Granulocytes 0.26 (H) 0.00 - 0.07 K/uL    Comment: Performed at [facility] at [facility], [address] [place], [place] [address]  Save Smear [facility])     Status: None   Collection Time: 08/17/18  1:26 PM  Result Value Ref Range   Smear Review SMEAR STAINED AND AVAILABLE FOR REVIEW     Comment: Performed at [facility] at [facility], [address] [place], [place] [address]  CMP ([place] only)     Status: Abnormal   Collection Time: 08/17/18  1:26 PM  Result Value Ref Range   Sodium 138 135 - 145 mmol/L   Potassium 4.0 3.5 - 5.1 mmol/L   Chloride 102 98 - 111 mmol/L   CO2 28 22 - 32 mmol/L   Glucose, Bld 133 (H) 70 - 99 mg/dL   BUN 14 8 - 23 mg/dL   Creatinine 1.10 0.61 - 1.24 mg/dL   Calcium 9.6 8.9 - 10.3 mg/dL   Total Protein 6.6 6.5 - 8.1 g/dL   Albumin 4.3 3.5 - 5.0 g/dL   AST 15 15 - 41 U/L   ALT 16 0 - 44 U/L   Alkaline Phosphatase 83 38 - 126 U/L   Total Bilirubin 0.5 0.3 - 1.2 mg/dL   GFR, Est Non Af Am >60 >60 mL/min   GFR, Est AFR Am >60 >60 mL/min   Anion gap 8 5 - 15  Comment: Performed at [facility] at [facility], [address], [place], [place] [address]      RADIOGRAPHY: No results found.     PATHOLOGY: None  ASSESSMENT/PLAN: Mr. Scholle is a very pleasant 73 yo caucasian gentleman with several years history of leukocytosis. He is having a lot of GI symptoms that are concerning.  Referral to Gastroenterology was placed for further work up.  WBC count today is 14.1. No anemia and platelet count is stable at 236.  Flow cytometry sent and result pending.  We will go ahead and plan to see him back in another 2 months. This is subject to change if needed with lab results.   All questions were answered and he is in agreement with the plan. He will contact our  office with any questions or concerns. We can certainly see him sooner if need be.   He was discussed with and also seen by Dr. Maylon Peppers and he is in agreement with the aforementioned.   Tish Men, FNP-BC     Addendum:   I was present during the visit, and confirmed the history with the patient.  For detailed HPI, see the note outlined above.  Briefly, patient has had longstanding leukocytosis dating back to at least 2010.  He was referred to [facility] for evaluation of "leukemia", but was told that he did not have any blood disorder, and no follow-up was recommended.  Patient does not smoke tobacco or drink alcohol.  He is very active, and denies any constitutional symptoms, chest pain, dyspnea, abdominal pain, nausea, vomiting, diarrhea, or abnormal bleeding/bruising.  Reviewed the patient's labs showed mild leukocytosis (WBC 10-15K) dating back to 2010, with one episode of higher WBC count due to PNA in 2015.  Hgb and platelet count were normal.  I personally reviewed the patient's peripheral blood smear, which showed normal WBC morphology with occasional atypical lymphocytes, which is nonspecific.  There was no definite dysplastic change.  The RBC size and morphology were normal.  The platelet morphology was normal without any clumping.  I discussed at length with the patient regarding the various causes of mild leukocytosis, including infection, inflammation, and the in less common cases, myeloproliferative disease and CML.  Given the atypical lymphocytes on peripheral blood smear, we have ordered peripheral blood flow cytometry to rule out any clonal process.  We will plan to see the patient back in 2 months for repeat lab follow-up, and if his WBC remains persistently elevated, we will order MPN NGS and BCR/ABL to rule out any myeloproliferative disorder.  The patient was counseled on any concerning symptoms, such as unexplained fever, night sweats, weight loss, lymphadenopathy, for which he should  contact the clinic promptly.  In addition, patient reports intermittent dyspepsia, and has a history of multiple antibiotic courses for "bacterial infection in the gut".  Therefore, we have referred the patient to gastroenterology for further evaluation.  Tish Men, MD

## 2018-08-18 ENCOUNTER — Encounter: Payer: Self-pay | Admitting: Gastroenterology

## 2018-08-18 ENCOUNTER — Telehealth: Payer: Self-pay | Admitting: Hematology & Oncology

## 2018-08-18 LAB — LACTATE DEHYDROGENASE: LDH: 171 U/L (ref 98–192)

## 2018-08-18 NOTE — Telephone Encounter (Signed)
Appointments r/s from Dr Maylon Peppers to Dr Ennever/SCHEDULING ERROR

## 2018-08-20 ENCOUNTER — Telehealth: Payer: Self-pay | Admitting: Hematology & Oncology

## 2018-08-20 NOTE — Telephone Encounter (Signed)
Record Release faxed to [facility] @ [phone] patient was not present to sign so verbal authorization was obtained 08/18/18

## 2018-08-24 ENCOUNTER — Encounter: Payer: Self-pay | Admitting: Gastroenterology

## 2018-08-24 ENCOUNTER — Ambulatory Visit (INDEPENDENT_AMBULATORY_CARE_PROVIDER_SITE_OTHER): Payer: Medicare Other | Admitting: Gastroenterology

## 2018-08-24 VITALS — BP 148/90 | HR 90 | Ht 68.0 in | Wt 204.0 lb

## 2018-08-24 DIAGNOSIS — R110 Nausea: Secondary | ICD-10-CM

## 2018-08-24 DIAGNOSIS — K6389 Other specified diseases of intestine: Secondary | ICD-10-CM

## 2018-08-24 DIAGNOSIS — R140 Abdominal distension (gaseous): Secondary | ICD-10-CM

## 2018-08-24 DIAGNOSIS — K219 Gastro-esophageal reflux disease without esophagitis: Secondary | ICD-10-CM | POA: Diagnosis not present

## 2018-08-24 DIAGNOSIS — K449 Diaphragmatic hernia without obstruction or gangrene: Secondary | ICD-10-CM | POA: Diagnosis not present

## 2018-08-24 DIAGNOSIS — Z8619 Personal history of other infectious and parasitic diseases: Secondary | ICD-10-CM | POA: Diagnosis not present

## 2018-08-24 MED ORDER — RIFAXIMIN 550 MG PO TABS: 550.0000 mg | ORAL_TABLET | Freq: Three times a day (TID) | ORAL | 0 refills | Status: DC

## 2018-08-24 MED ORDER — ALIGN PO CAPS: 1.0000 | ORAL_CAPSULE | Freq: Every day | ORAL | 0 refills | Status: AC

## 2018-08-24 NOTE — Progress Notes (Signed)
Chief Complaint: Bloating, Nausea, Abdominal pain, increased gas/flatus, GERD  Referring Provider:    Vergia Alcon Holli Humbles, NP  HPI:    [name] is a 73 y.o. male referred to the Gastroenterology Clinic for evaluation of multiple GI sxs, to include abdominal bloating, generalized abdominal pain, increased gas/flatus/belching and nausea without emesis. He states he has to graze every 2-3 hours for the last 2-3 months to try to reduce nausea. Sxs started during recent home renovvations over preceding 6-7 months. Additionally, recently started B12 injections for deficiency and fatigue. Has had multiple Abx courses in the past for "GI infections". No overt GI blood loss. No lower GI sxs.   Has had similar sxs in the past and treated by GI in [place]. Had a colonoscopy approx 3 years ago and n/f polyp per patient. Did ?UGI series. Had EGD which he reports +H pylori- prescribed Abx which he could not complete d/t ADRs (abd pain).  Reports that EGD was also notable for erosive esophagitis.  Interestingly, H. pylori stool antigen negative in 2015.  Had Abx approx 8 weeks ago for Sinusitis. GI sxs predate those Abx though but possibly worse since then.   Takes Align 2-3 times/week for intermittent constipation.   Has appt with ENT for motion sickness and possible BPPV.  Has a longstanding history of vertigo since teenage years.  Additionally, he reports a longstanding history of reflux, characterized by heartburn, regurgitation.  He has been taking pantoprazole for many years, which controls his reflux for the most part.  Only occasional breakthrough symptoms.  Has had multiple EGDs in the past, and reports previous history of reflux esophagitis.  Also noted small hiatal hernia on prior endoscopy.  He is very interested in TIF as a means to stop or significantly reduce acid suppression therapy as well as improve his reflux symptoms.   Recent labs n/f: WBC 14.1 (chronic leukocytosis),  normal remainder of CBC.   Endoscopic Hx: - EGD (10/2009, Dr. Sharlett Iles): 3-4 cm HH, Candida Esophagitis, normal stomach, 37F Maloney dilation - Colonoscopy (10/2009, Dr. Sharlett Iles): diverticulosis, rectal HPs -EGD (06/2006, Dr. Sharlett Iles): 2 cm hiatal hernia otherwise normal -Colonoscopy (05/2003, Dr. Sharlett Iles): Diverticulosis otherwise normal.  Recommended repeat in 5 years - Colonoscopy (08/1998): TA -EGD (08/1998): Hiatal hernia, Schatzki's ring -EGD (08/1994, Dr. Sharlett Iles): Hiatal hernia, esophageal stricture, reflux changes - Colonoscopy (03/1989): TA  FHx: Mother: Benign brain tumor, ?Breast CA Brother: Buerger's Disease  Past Medical History:  Diagnosis Date  . ALLERGIC RHINITIS CAUSE UNSPECIFIED 05/27/2010  . Arthritis   . BACK PAIN, UPPER 02/21/2010  . Cervicalgia 05/27/2010  . CHEST PAIN UNSPECIFIED 04/25/2009  . DIVERTICULOSIS, COLON 03/09/2007  . DIZZINESS, CHRONIC 03/09/2007  . GERD 12/24/2006  . HEADACHE, SINUS 04/06/2009  . History of colon polyps   . HYPERLIPIDEMIA 12/24/2006   diet controlled  . HYPERTENSION 12/24/2006   better after weight loss  . INSOMNIA WITH SLEEP APNEA UNSPECIFIED 05/27/2010  . Motion sickness   . Neuropathy   . Open wound of wrist, complicated 8/75/6433  . OSTEOARTHRITIS 12/24/2006  . Other constipation 11/06/2009  . Pneumonia   . PONV (postoperative nausea and vomiting)   . PROSTATITIS, RECURRENT 12/30/2007  . SHOULDER PAIN, BILATERAL 07/16/2007  . Sleep apnea   . Squamous cell carcinoma, ear, left   . TRANSIENT ISCHEMIC ATTACKS, HX OF 04/06/2009     Past Surgical History:  Procedure Laterality Date  . ANKLE ARTHROSCOPY W/ OPEN  REPAIR  2002   Repair  . COLONOSCOPY    . COLONOSCOPY     around 2017. [place]   . CYST EXCISION     from back 2015. likely this was meaning of ear cyst  . EAR CYST EXCISION Bilateral 01/18/2013   Procedure: EXCISION CYSTS FROM BACK (TWO);  Surgeon: Earnstine Regal, MD;  Location: [facility];   Service: General;  Laterality: Bilateral;  . epidydmal cyst excision    . ESOPHAGOGASTRODUODENOSCOPY     [place] around 2017  . REPLACEMENT TOTAL KNEE BILATERAL  2005/2007  . SKIN CANCER EXCISION  2020   ear, squamous cell  . TONSILLECTOMY     Family History  Problem Relation Age of Onset  . Achalasia Father   . Depression Father   . Heart disease Father   . Heart disease Brother 36       heavy smoker-died of MI  . Kidney failure Mother 55  . Breast cancer Mother   . Diabetes Sister   . Colon cancer Neg Hx   . Esophageal cancer Neg Hx    Social History   Tobacco Use  . Smoking status: Never Smoker  . Smokeless tobacco: Never Used  . Tobacco comment: second hand smoke  Substance Use Topics  . Alcohol use: No    Comment: quit 45 yrs ago  . Drug use: No   Current Outpatient Medications  Medication Sig Dispense Refill  . amitriptyline (ELAVIL) 50 MG tablet Take 1 tablet (50 mg total) by mouth at bedtime. 90 tablet 1  . aspirin EC 81 MG EC tablet Take 1 tablet (81 mg total) by mouth daily.    . Cyanocobalamin (B-12 IJ) Inject as directed. Ever other week for 2 months and then every month afterward    . fluticasone (FLONASE) 50 MCG/ACT nasal spray Place 2 sprays into both nostrils daily. 16 g 6  . meclizine (ANTIVERT) 12.5 MG tablet Take 1-2 tablets (12.5-25 mg total) by mouth every 6 (six) hours as needed for dizziness. 30 tablet 1  . pantoprazole (PROTONIX) 40 MG tablet Take 1 tablet (40 mg total) by mouth daily. 90 tablet 1  . Probiotic Product ([place]) CAPS Take 1 capsule by mouth daily.     . sodium chloride (OCEAN) 0.65 % SOLN nasal spray Place 2 sprays into both nostrils at bedtime.     No current facility-administered medications for this visit.    Allergies  Allergen Reactions  . Codeine Other (See Comments)    REACTION: Nausea     Review of Systems: All systems reviewed and negative except where noted in HPI.     Physical Exam:    Wt  Readings from Last 3 Encounters:  08/24/18 204 lb (92.5 kg)  08/11/18 202 lb 12.8 oz (92 kg)  07/16/18 198 lb 8 oz (90 kg)    Ht 5\' 8"  (1.727 m)   Wt 204 lb (92.5 kg)   BMI 31.02 kg/m  Constitutional:  Pleasant, in no acute distress. Psychiatric: Normal mood and affect. Behavior is normal. EENT: Pupils normal.  Conjunctivae are normal. No scleral icterus. Neck supple. No cervical LAD. Cardiovascular: Normal rate, regular rhythm. No edema Pulmonary/chest: Effort normal and breath sounds normal. No wheezing, rales or rhonchi. Abdominal: Soft, nondistended, nontender. Bowel sounds active throughout. There are no masses palpable. No hepatomegaly. Neurological: Alert and oriented to person place and time. Skin: Skin is warm and dry. No rashes noted.   ASSESSMENT AND PLAN;  [name] is a 73 y.o. male presenting with:  1) GERD: Longstanding history of reflux which is generally well controlled with daily pantoprazole.  Does have intermittent breakthrough symptoms.  Reviewed available previous endoscopy reports which demonstrate hiatal hernia (size not given) and mention of reflux changes, but not specifically written as reflux esophagitis.  He reports an EGD done approximately 3 years ago at an outside facility that demonstrated reflux esophagitis.  This report is not available in EMR for review today.  We will try to request those reports.  Otherwise, he is very interested in TIF as a means to better control his reflux as well as stop or significantly reduce acid suppression therapy.  We discussed TIF at length today and will proceed as below: -EGD to evaluate for erosive esophagitis, LES laxity, evaluate size of hiatal hernia or part of preoperative evaluation -If no objective evidence of reflux on EGD, plan for barium esophagram -Obtain previous records to include prior EGD and if he did not fact have an upper GI series done - Resume PPI for now -Resume antireflux lifestyle  measures - TIF handout provided to patient today  2) Hx of Hiatal Hernia: -We will evaluate size of hernia at time of EGD as above  3) Bloating: Clinical presentation seems most consistent with SIBO, potentially secondary to multiple previous antibiotic exposures.  Treat as below: - Rifaximin 550 mg PO TID x14 days.  Provided with samples today -Probiotic daily for 3 weeks -Evaluate for clinical improvement with empiric treatment as above.  If only partly improved, can do a second course of therapy.  If no improvement, will continue evaluation -Evaluate for duodenal etiology at time of EGD as above  4) Nausea: - Eval for mucosal/luminal etiology at time of EGD with random and directed biopsies -Has follow-up with ENT for treatment of vertigo.  Can observe for clinical improvement with that treatment  5) Abdominal pain: - EGD with bxs - Evaluate for clinical improvement with treatment of SIBO as above  6) Hx of H pylori: History of H. pylori that was incompletely treated with antibiotics due to medication ADRs. - Eval with EGD w/ bxs and further antimicrobial therapy as appropriate  RTC after EGD  The indications, risks, and benefits of EGD were explained to the patient in detail. Risks include but are not limited to bleeding, perforation, adverse reaction to medications, and cardiopulmonary compromise. Sequelae include but are not limited to the possibility of surgery, hositalization, and mortality. The patient verbalized understanding and wished to proceed. All questions answered, referred to scheduler. Further recommendations pending results of the exam.     Lavena Bullion, DO, FACG  08/24/2018, 3:06 PM  BO:[id number], Steele Berg, MD, Laverna Peace, NP

## 2018-08-24 NOTE — Patient Instructions (Signed)
If you are age 72 or older, your body mass index should be between 23-30. Your Body mass index is 31.02 kg/m. If this is out of the aforementioned range listed, please consider follow up with your Primary Care Provider.  If you are age 72 or younger, your body mass index should be between 19-25. Your Body mass index is 31.02 kg/m. If this is out of the aformentioned range listed, please consider follow up with your Primary Care Provider.   We have sent the following medications to your pharmacy for you to pick up at your convenience: Rifaximin 550mg  three times a day for 14 days  Please purchase the following medications over the counter and take as directed: Align daily for 3 weeks  For more information on the TIF procedure please visit the website at [email or web address]  It was a pleasure to see you today!  Vito Cirigliano, D.O.

## 2018-08-25 LAB — FLOW CYTOMETRY

## 2018-08-26 ENCOUNTER — Ambulatory Visit
Admission: RE | Admit: 2018-08-26 | Discharge: 2018-08-26 | Disposition: A | Discharge status: Home / Self Care | Payer: Medicare Other | Source: Ambulatory Visit | Attending: Diagnostic Neuroimaging | Admitting: Diagnostic Neuroimaging

## 2018-08-26 DIAGNOSIS — G450 Vertebro-basilar artery syndrome: Secondary | ICD-10-CM

## 2018-08-26 DIAGNOSIS — H814 Vertigo of central origin: Secondary | ICD-10-CM | POA: Diagnosis not present

## 2018-08-26 DIAGNOSIS — G4489 Other headache syndrome: Secondary | ICD-10-CM

## 2018-08-26 MED ORDER — GADOBENATE DIMEGLUMINE 529 MG/ML IV SOLN
19.0000 mL | Freq: Once | INTRAVENOUS | Status: AC | PRN
  Administered 2018-08-26: 19 mL via INTRAVENOUS

## 2018-08-30 ENCOUNTER — Ambulatory Visit (INDEPENDENT_AMBULATORY_CARE_PROVIDER_SITE_OTHER): Payer: Medicare Other | Admitting: *Deleted

## 2018-08-30 ENCOUNTER — Telehealth: Payer: Self-pay | Admitting: Family Medicine

## 2018-08-30 ENCOUNTER — Telehealth: Payer: Self-pay | Admitting: Family

## 2018-08-30 DIAGNOSIS — E538 Deficiency of other specified B group vitamins: Secondary | ICD-10-CM | POA: Diagnosis not present

## 2018-08-30 MED ORDER — CYANOCOBALAMIN 1000 MCG/ML IJ SOLN
1000.0000 ug | Freq: Once | INTRAMUSCULAR | Status: AC
  Administered 2018-08-30: 1000 ug via INTRAMUSCULAR

## 2018-08-30 NOTE — Telephone Encounter (Signed)
I did not see any referral  From neurology informed pt of this   Copied from [place] [phone]. Topic: General - Other >> Aug 05, 2018 12:25 PM Judyann Munson wrote: Reason for CRM: patient stated he missed a call  in regards to a referral for a neurology, please advise the patient is unsure who called >> Aug 05, 2018 12:41 PM Virl Cagey, CMA wrote: I do not see a Neurology referral - just Otolaryngology and Oncology referral .  Will send to Neoma Laming as she has been working on these referrals.

## 2018-08-30 NOTE — Progress Notes (Signed)
Per orders of Dr. Inocente Salles, injection of Vit B12 given by Virl Cagey. Patient tolerated injection well.

## 2018-08-30 NOTE — Telephone Encounter (Signed)
I was able to speak with [name] and went over his lab results with him. Flow cytometry was negative. No questions at this time. We will see him back in 6 months for lab and follow-up.

## 2018-08-31 ENCOUNTER — Ambulatory Visit: Payer: Medicare Other

## 2018-09-01 ENCOUNTER — Telehealth: Payer: Self-pay | Admitting: *Deleted

## 2018-09-01 NOTE — Telephone Encounter (Signed)
Spoke with patient and informed him his MRI brain/IAC and MRA head results are unremarkable with no major findings. I reviewed Dr Gladstone Lighter plan per office note.  He doesn't have FU scheduled; he stated he would call back to schedule FU. I advised he call with any questions or concerns. Patient verbalized understanding, appreciation.

## 2018-09-02 ENCOUNTER — Ambulatory Visit (AMBULATORY_SURGERY_CENTER): Payer: Medicare Other | Admitting: Gastroenterology

## 2018-09-02 ENCOUNTER — Encounter: Payer: Self-pay | Admitting: Gastroenterology

## 2018-09-02 ENCOUNTER — Other Ambulatory Visit: Payer: Self-pay

## 2018-09-02 VITALS — BP 132/67 | HR 70 | Temp 96.9°F | Resp 19 | Ht 64.0 in | Wt 204.0 lb

## 2018-09-02 DIAGNOSIS — K2970 Gastritis, unspecified, without bleeding: Secondary | ICD-10-CM

## 2018-09-02 DIAGNOSIS — K2990 Gastroduodenitis, unspecified, without bleeding: Secondary | ICD-10-CM

## 2018-09-02 DIAGNOSIS — Z1211 Encounter for screening for malignant neoplasm of colon: Secondary | ICD-10-CM | POA: Diagnosis not present

## 2018-09-02 DIAGNOSIS — K3189 Other diseases of stomach and duodenum: Secondary | ICD-10-CM

## 2018-09-02 DIAGNOSIS — K219 Gastro-esophageal reflux disease without esophagitis: Secondary | ICD-10-CM

## 2018-09-02 DIAGNOSIS — K449 Diaphragmatic hernia without obstruction or gangrene: Secondary | ICD-10-CM

## 2018-09-02 DIAGNOSIS — R110 Nausea: Secondary | ICD-10-CM

## 2018-09-02 MED ORDER — SODIUM CHLORIDE 0.9 % IV SOLN: 500.0000 mL | Freq: Once | INTRAVENOUS | Status: DC

## 2018-09-02 NOTE — Patient Instructions (Signed)
  Thank you for allowing [place] to care for you today!  Await biopsy results, approximately 2 weeks.  Resume previous diet and medications today.  Return to your normal activities tomorrow.  Return to GI clinic after all scheduled testing completed.    YOU HAD AN ENDOSCOPIC PROCEDURE TODAY AT [facility]:   Refer to the procedure report that was given to you for any specific questions about what was found during the examination.  If the procedure report does not answer your questions, please call your gastroenterologist to clarify.  If you requested that your care partner not be given the details of your procedure findings, then the procedure report has been included in a sealed envelope for you to review at your convenience later.  YOU SHOULD EXPECT: Some feelings of bloating in the abdomen. Passage of more gas than usual.  Walking can help get rid of the air that was put into your GI tract during the procedure and reduce the bloating. If you had a lower endoscopy (such as a colonoscopy or flexible sigmoidoscopy) you may notice spotting of blood in your stool or on the toilet paper. If you underwent a bowel prep for your procedure, you may not have a normal bowel movement for a few days.  Please Note:  You might notice some irritation and congestion in your nose or some drainage.  This is from the oxygen used during your procedure.  There is no need for concern and it should clear up in a day or so.  SYMPTOMS TO REPORT IMMEDIATELY:    Following upper endoscopy (EGD)  Vomiting of blood or coffee ground material  New chest pain or pain under the shoulder blades  Painful or persistently difficult swallowing  New shortness of breath  Fever of 100F or higher  Black, tarry-looking stools  For urgent or emergent issues, a gastroenterologist can be reached at any hour by calling [phone].   DIET:  We do recommend a small meal at first, but then you may proceed to your  regular diet.  Drink plenty of fluids but you should avoid alcoholic beverages for 24 hours.  ACTIVITY:  You should plan to take it easy for the rest of today and you should NOT DRIVE or use heavy machinery until tomorrow (because of the sedation medicines used during the test).    FOLLOW UP: Our staff will call the number listed on your records the next business day following your procedure to check on you and address any questions or concerns that you may have regarding the information given to you following your procedure. If we do not reach you, we will leave a message.  However, if you are feeling well and you are not experiencing any problems, there is no need to return our call.  We will assume that you have returned to your regular daily activities without incident.  If any biopsies were taken you will be contacted by phone or by letter within the next 1-3 weeks.  Please call us at [phone] if you have not heard about the biopsies in 3 weeks.    SIGNATURES/CONFIDENTIALITY: You and/or your care partner have signed paperwork which will be entered into your electronic medical record.  These signatures attest to the fact that that the information above on your After Visit Summary has been reviewed and is understood.  Full responsibility of the confidentiality of this discharge information lies with you and/or your care-partner.

## 2018-09-02 NOTE — Progress Notes (Signed)
To PACU, VSS. Report to [email or web address].tb 

## 2018-09-02 NOTE — Op Note (Signed)
[facility] Patient Name: [name] Procedure Date: 09/02/2018 11:46 AM MRN: [id number] Endoscopist: Gerrit Heck , MD Age: 73 Referring MD:  Date of Birth: 1946/06/05 Gender: Male Account #: [id number] Procedure:                Upper GI endoscopy Indications:              Heartburn, Suspected esophageal reflux,                            Preoperative assessment for hiatal hernia repair                            and antireflux surgery, Exclusion of Helicobacter                            pylori, Abdominal bloating, Nausea Medicines:                Monitored Anesthesia Care Procedure:                Pre-Anesthesia Assessment:                           - Prior to the procedure, a History and Physical                            was performed, and patient medications and                            allergies were reviewed. The patient's tolerance of                            previous anesthesia was also reviewed. The risks                            and benefits of the procedure and the sedation                            options and risks were discussed with the patient.                            All questions were answered, and informed consent                            was obtained. Prior Anticoagulants: The patient has                            taken no previous anticoagulant or antiplatelet                            agents. ASA Grade Assessment: II - A patient with                            mild systemic disease. After reviewing the risks  and benefits, the patient was deemed in                            satisfactory condition to undergo the procedure.                           After obtaining informed consent, the endoscope was                            passed under direct vision. Throughout the                            procedure, the patient's blood pressure, pulse, and                            oxygen saturations were monitored  continuously. The                            Model GIF-HQ190 [id number]) scope was introduced                            through the mouth, and advanced to the second part                            of duodenum. The upper GI endoscopy was                            accomplished without difficulty. The patient                            tolerated the procedure well. Scope In: Scope Out: Findings:                 Esophagogastric landmarks were identified: the                            Z-line was found at 38 cm, the gastroesophageal                            junction was found at 38 cm and the site of hiatal                            narrowing was found at 40 cm from the incisors.                           A 2 cm axial height and 4 cm transverse width                            hiatal hernia was present.                           The Z-line was regular and was found 38 cm from the  incisors.                           The upper third of the esophagus and middle third                            of the esophagus were normal.                           The gastroesophageal flap valve was visualized                            endoscopically and classified as Hill Grade III                            (minimal fold, loose to endoscope, hiatal hernia                            likely) with a 4 cm transverse width hernia noted                            on retroflexed views.                           Localized mild inflammation characterized by                            erythema was found in the gastric antrum, in the                            prepyloric region of the stomach and at the                            pylorus. Biopsies were taken with a cold forceps                            for histology. Estimated blood loss was minimal.                           The gastric fundus and gastric body were normal.                            Biopsies were taken with a cold  forceps for                            Helicobacter pylori testing. Estimated blood loss                            was minimal.                           The duodenal bulb, first portion of the duodenum                            and second  portion of the duodenum were normal.                            Biopsies were taken with a cold forceps for                            histology and evaluate for villous atrophy                            susggestive of SIBO. Estimated blood loss was                            minimal. Complications:            No immediate complications. Estimated Blood Loss:     Estimated blood loss was minimal. Impression:               - Esophagogastric landmarks identified.                           - 2 cm axial height and 4 cm transverse width                            hiatal hernia.                           - Z-line regular, 38 cm from the incisors.                           - Normal upper third of esophagus and middle third                            of esophagus.                           - Gastroesophageal flap valve classified as Hill                            Grade III (minimal fold, loose to endoscope, hiatal                            hernia likely).                           - Gastritis. Biopsied.                           - Normal gastric fundus and gastric body. Biopsied.                           - Normal duodenal bulb, first portion of the                            duodenum and second portion of the duodenum.  Biopsied. Recommendation:           - Patient has a contact number available for                            emergencies. The signs and symptoms of potential                            delayed complications were discussed with the                            patient. Return to normal activities tomorrow.                            Written discharge instructions were provided to the                             patient.                           - Resume previous diet today.                           - Continue present medications.                           - Await pathology results.                           - Perform an esophagram at appointment to be                            scheduled to evaluate for refluxate and                            radiographic assessment of hernia.                           - Return to GI clinic after studies are complete.                            Will discuss operative management to include                            laporoscopic hiatal hernia repair and Transoral                            Incisionless Fundoplication. Gerrit Heck, MD 09/02/2018 12:14:56 PM

## 2018-09-03 ENCOUNTER — Telehealth: Payer: Self-pay | Admitting: *Deleted

## 2018-09-03 ENCOUNTER — Telehealth: Payer: Self-pay

## 2018-09-03 ENCOUNTER — Other Ambulatory Visit: Payer: Self-pay

## 2018-09-03 DIAGNOSIS — K219 Gastro-esophageal reflux disease without esophagitis: Secondary | ICD-10-CM

## 2018-09-03 DIAGNOSIS — K449 Diaphragmatic hernia without obstruction or gangrene: Secondary | ICD-10-CM

## 2018-09-03 NOTE — Telephone Encounter (Signed)
Called and spoke with patient-patient informed of recommendations from upper gi endo procedure report to have esophogram completed-ordered/scheduled on 09/21/2018 at [facility] arrival at 10:45am for 11:00am appt, patient advised to be NPO after 8:00am; patient also made an appt for f/u OV on 09/27/2018 arrival at 10:30am for 10:45am appt; patient was advised to call back if questions/concerns arise; patient verbalized understanding of information/instructions;

## 2018-09-03 NOTE — Telephone Encounter (Signed)
  Follow up Call-  Call back number 09/02/2018  Post procedure Call Back phone  # [phone]  Permission to leave phone message Yes  Some recent data might be hidden     Patient questions:  Do you have a fever, pain , or abdominal swelling? No. Pain Score  0 *  Have you tolerated food without any problems? Yes.    Have you been able to return to your normal activities? Yes.    Do you have any questions about your discharge instructions: Diet   No. Medications  No. Follow up visit  No.  Do you have questions or concerns about your Care? No.  Actions: * If pain score is 4 or above: No action needed, pain <4.

## 2018-09-09 DIAGNOSIS — M50122 Cervical disc disorder at C5-C6 level with radiculopathy: Secondary | ICD-10-CM | POA: Diagnosis not present

## 2018-09-09 DIAGNOSIS — M47812 Spondylosis without myelopathy or radiculopathy, cervical region: Secondary | ICD-10-CM | POA: Diagnosis not present

## 2018-09-09 DIAGNOSIS — S46002D Unspecified injury of muscle(s) and tendon(s) of the rotator cuff of left shoulder, subsequent encounter: Secondary | ICD-10-CM | POA: Diagnosis not present

## 2018-09-13 ENCOUNTER — Other Ambulatory Visit: Payer: Self-pay | Admitting: Rehabilitation

## 2018-09-13 ENCOUNTER — Ambulatory Visit: Payer: Medicare Other | Attending: Rehabilitation | Admitting: Physical Therapy

## 2018-09-13 ENCOUNTER — Other Ambulatory Visit: Payer: Self-pay

## 2018-09-13 ENCOUNTER — Encounter: Payer: Self-pay | Admitting: Physical Therapy

## 2018-09-13 DIAGNOSIS — G43109 Migraine with aura, not intractable, without status migrainosus: Secondary | ICD-10-CM | POA: Diagnosis not present

## 2018-09-13 DIAGNOSIS — H838X3 Other specified diseases of inner ear, bilateral: Secondary | ICD-10-CM | POA: Diagnosis not present

## 2018-09-13 DIAGNOSIS — M6281 Muscle weakness (generalized): Secondary | ICD-10-CM | POA: Diagnosis not present

## 2018-09-13 DIAGNOSIS — M25512 Pain in left shoulder: Secondary | ICD-10-CM | POA: Diagnosis not present

## 2018-09-13 DIAGNOSIS — S46002D Unspecified injury of muscle(s) and tendon(s) of the rotator cuff of left shoulder, subsequent encounter: Secondary | ICD-10-CM

## 2018-09-13 DIAGNOSIS — R42 Dizziness and giddiness: Secondary | ICD-10-CM | POA: Diagnosis not present

## 2018-09-13 NOTE — Therapy (Addendum)
[facility] [place], [place], [address] Phone: [phone]   Fax:  [phone]  Physical Therapy Evaluation  Patient Details  Name: [name] MRN: [id number] Date of Birth: 1946-04-29 Referring Provider (PT): Zonia Kief MD   Encounter Date: 09/13/2018  PT End of Session - 09/13/18 1120    Visit Number  1    Number of Visits  12    Date for PT Re-Evaluation  10/25/18    Authorization Type  FOTO AT LEAST EVERY 5TH VISIT.  KX MODIFIER AFTER 15 VISITS.  PROGRESS NOTE AT 10TH VISIT.    PT Start Time  0945    PT Stop Time  1041    PT Time Calculation (min)  56 min    Activity Tolerance  Patient tolerated treatment well    Behavior During Therapy  WFL for tasks assessed/performed       Past Medical History:  Diagnosis Date  . ALLERGIC RHINITIS CAUSE UNSPECIFIED 05/27/2010  . Arthritis   . BACK PAIN, UPPER 02/21/2010  . Cervicalgia 05/27/2010  . CHEST PAIN UNSPECIFIED 04/25/2009  . DIVERTICULOSIS, COLON 03/09/2007  . DIZZINESS, CHRONIC 03/09/2007  . GERD 12/24/2006  . HEADACHE, SINUS 04/06/2009  . History of colon polyps   . HYPERLIPIDEMIA 12/24/2006   diet controlled  . HYPERTENSION 12/24/2006   better after weight loss  . INSOMNIA WITH SLEEP APNEA UNSPECIFIED 05/27/2010  . Motion sickness   . Neuropathy   . Open wound of wrist, complicated 12/14/735  . OSTEOARTHRITIS 12/24/2006  . Other constipation 11/06/2009  . Pneumonia   . PONV (postoperative nausea and vomiting)   . PROSTATITIS, RECURRENT 12/30/2007  . SHOULDER PAIN, BILATERAL 07/16/2007  . Sleep apnea   . Squamous cell carcinoma, ear, left   . TRANSIENT ISCHEMIC ATTACKS, HX OF 04/06/2009    Past Surgical History:  Procedure Laterality Date  . ANKLE ARTHROSCOPY W/ OPEN REPAIR  2002   Repair  . COLONOSCOPY    . COLONOSCOPY     around 2017. [place]   . CYST EXCISION     from back 2015. likely this was meaning of ear cyst  . EAR CYST EXCISION Bilateral  01/18/2013   Procedure: EXCISION CYSTS FROM BACK (TWO);  Surgeon: Earnstine Regal, MD;  Location: [facility];  Service: General;  Laterality: Bilateral;  . epidydmal cyst excision    . ESOPHAGOGASTRODUODENOSCOPY     [place] around 2017  . REPLACEMENT TOTAL KNEE BILATERAL  2005/2007  . SKIN CANCER EXCISION  2020   ear, squamous cell  . TONSILLECTOMY      There were no vitals filed for this visit.   Subjective Assessment - 09/13/18 1123    Subjective  The patient reports falling onto his left UE resulting in a great deal of pain.  He rates his pain at a 8/10 today.  Lifting makes his pain even worse.  Medication and massage decreases his pain.    Pertinent History  H/o TIA's, h/o spinal pain, OA and ankle surgery.    Diagnostic tests  X-ray.  May be getting an MRI soon.    Patient Stated Goals  Use left UE without pain.    Currently in Pain?  Yes    Pain Score  8     Pain Location  Shoulder    Pain Orientation  Left    Pain Descriptors / Indicators  Aching;Sharp;Throbbing    Pain Type  Acute pain    Pain Onset  More than a month ago    Pain Frequency  Constant    Aggravating Factors   See above.    Pain Relieving Factors  See above.         [facility] PT Assessment - 09/13/18 0001      Assessment   Medical Diagnosis  Injury of tendon of RTC of left shoulder.    Referring Provider (PT)  Zonia Kief MD    Onset Date/Surgical Date  --   ~3 months.     Precautions   Precautions  None      Restrictions   Weight Bearing Restrictions  No      Balance Screen   Has the patient fallen in the past 6 months  Yes    How many times?  --   1.   Has the patient had a decrease in activity level because of a fear of falling?   No    Is the patient reluctant to leave their home because of a fear of falling?   No      Home Environment   Living Environment  Private residence      Prior Function   Level of Independence  Independent      Observation/Other Assessments    Focus on Therapeutic Outcomes (FOTO)   53% limitation.      Posture/Postural Control   Posture/Postural Control  Postural limitations    Postural Limitations  Rounded Shoulders;Forward head      Deep Tendon Reflexes   DTR Assessment Site  Biceps;Brachioradialis;Triceps    Biceps DTR  1+    Brachioradialis DTR  1+    Triceps DTR  1+      ROM / Strength   AROM / PROM / Strength  AROM;Strength      AROM   Overall AROM Comments  Active left shoulder antigravity flexion= 140 degrees, ER/IR is full range.      Strength   Overall Strength Comments  Left shoudler ER= 4-/5.      Palpation   Palpation comment  Tender to palpation over left UT, medial scapular border, posterior cuff and acromial ridge.      Special Tests    Special Tests  Rotator Cuff Impingement    Rotator Cuff Impingment tests  Neer impingement test;Hawkins- Kennedy test      Neer Impingement test    Findings  Positive    Side  Left      Hawkins-Kennedy test   Findings  Positive    Side  Left      Ambulation/Gait   Gait Comments  WNL.                Objective measurements completed on examination: See above findings.      [facility] Adult PT Treatment/Exercise - 09/13/18 0001      Modalities   Modalities  Electrical Stimulation;Vasopneumatic      Electrical Stimulation   Electrical Stimulation Location  Left shoulder.    Electrical Stimulation Action  IFC    Electrical Stimulation Parameters  80-150 Hz x 20 minutes.    Electrical Stimulation Goals  Pain      Vasopneumatic to left shoulder on medium x 20 minutes.         PT Short Term Goals - 09/13/18 1201      PT SHORT TERM GOAL #1   Title  STG's=LTG's.        PT Long Term Goals - 09/13/18 1201      PT LONG  TERM GOAL #1   Title  Independent with a HEP.    Time  6    Period  Weeks    Status  New      PT LONG TERM GOAL #2   Title  Active left shoulder flexion to 150 degrees so the patient can easily reach overhead.    Time   6    Period  Weeks    Status  New      PT LONG TERM GOAL #3   Title  Increase left shoulder ER strength to a solid 4+/5 to increase stability for performance of functional activities.    Time  6    Period  Weeks    Status  New      PT LONG TERM GOAL #4   Title  Perform ADL's with left shoulder pain not > 2-3/10.    Time  6    Period  Weeks    Status  New             Plan - 09/13/18 1147    Clinical Impression Statement  The patient presents to OPPT with c/o left shoulder pain as the result of a fall about three months ago.  He has some loss of left ahoulder flexion and weakness into ER.  The patient demonstrates a positive left shoulder Impingement test..He has difffuse palpable tenderness over his left UT, medial scapular border, posterior cuff and acromial ridge.  Patient will benefit from skilled physical therapy intervention to address deficits and pain.          Stability/Clinical Decision Making  Evolving/Moderate complexity    Clinical Decision Making  Low    Rehab Potential  Excellent    PT Frequency  2x / week    PT Duration  6 weeks    PT Treatment/Interventions  ADLs/Self Care Home Management;Cryotherapy;Electrical Stimulation;Moist Heat;Ultrasound;Therapeutic exercise;Therapeutic activities;Patient/family education;Manual techniques;Passive range of motion;Dry needling;Vasopneumatic Device;Joint Manipulations    PT Next Visit Plan  Combo e'stim/U/S; STW/M, IASTM, UBE, RTC/scapular exercises.  SDLY ER, Full can.    Consulted and Agree with Plan of Care  Patient       Patient will benefit from skilled therapeutic intervention in order to improve the following deficits and impairments:  Pain, Decreased activity tolerance, Decreased range of motion, Decreased strength  Visit Diagnosis: Acute pain of left shoulder - Plan: PT plan of care cert/re-cert  Muscle weakness (generalized) - Plan: PT plan of care cert/re-cert     Problem List Patient Active Problem List    Diagnosis Date Noted  . 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 Tremor 04/10/2014  . Elevated hemoglobin A1c 04/10/2014  . Solitary pulmonary nodule 04/10/2014  . Constipation, slow transit 08/14/2010  . Allergic rhinitis 05/27/2010  . Cervicalgia 05/27/2010  . Insomnia with sleep apnea 05/27/2010  . HEADACHE, SINUS 04/06/2009  . SHOULDER PAIN, BILATERAL 07/16/2007  . DIZZINESS, CHRONIC 03/09/2007  . Hyperlipidemia 12/24/2006  . Essential hypertension 12/24/2006  . GERD 12/24/2006  . Osteoarthritis 12/24/2006    Milika Ventress, [name] 09/13/2018, 12:09 PM  [facility] [address] [place], [place], [address] Phone: [phone]   Fax:  [phone]  Name: TOSHIYUKI FREDELL MRN: [id number] Date of Birth: Sep 09, 1945

## 2018-09-14 ENCOUNTER — Ambulatory Visit (INDEPENDENT_AMBULATORY_CARE_PROVIDER_SITE_OTHER): Payer: Medicare Other | Admitting: *Deleted

## 2018-09-14 ENCOUNTER — Other Ambulatory Visit: Payer: Self-pay

## 2018-09-14 DIAGNOSIS — E538 Deficiency of other specified B group vitamins: Secondary | ICD-10-CM | POA: Diagnosis not present

## 2018-09-14 MED ORDER — CYANOCOBALAMIN 1000 MCG/ML IJ SOLN
1000.0000 ug | Freq: Once | INTRAMUSCULAR | Status: AC
  Administered 2018-09-14: 1000 ug via INTRAMUSCULAR

## 2018-09-16 ENCOUNTER — Other Ambulatory Visit: Payer: Self-pay

## 2018-09-16 ENCOUNTER — Ambulatory Visit: Payer: Medicare Other | Admitting: Physical Therapy

## 2018-09-16 ENCOUNTER — Encounter: Payer: Self-pay | Admitting: Gastroenterology

## 2018-09-16 ENCOUNTER — Telehealth: Payer: Self-pay | Admitting: *Deleted

## 2018-09-16 ENCOUNTER — Encounter: Payer: Self-pay | Admitting: Physical Therapy

## 2018-09-16 DIAGNOSIS — M25512 Pain in left shoulder: Secondary | ICD-10-CM | POA: Diagnosis not present

## 2018-09-16 DIAGNOSIS — M6281 Muscle weakness (generalized): Secondary | ICD-10-CM | POA: Diagnosis not present

## 2018-09-16 NOTE — Telephone Encounter (Signed)
Patient has been scheduled for barium esophagram at [facility] on 09/21/18 for evaluation of reflux and radiographic assessment of hernia. Unfortunately, due to COVID-19 pandemic, [facility] requests that all non-emergent procedures be cancelled or rescheduled at this time. Please advise as to whether this procedure can be rescheduled out by at least 4 weeks.Marland KitchenMarland KitchenMarland Kitchen

## 2018-09-16 NOTE — Therapy (Signed)
[facility] [place], [place], [address] Phone: [phone]   Fax:  [phone]  Physical Therapy Treatment  Patient Details  Name: [name] MRN: [id number] Date of Birth: 1945/07/19 Referring Provider (PT): Zonia Kief MD   Encounter Date: 09/16/2018  PT End of Session - 09/16/18 1239    Visit Number  2    Number of Visits  12    Date for PT Re-Evaluation  10/25/18    Authorization Type  FOTO AT LEAST EVERY 5TH VISIT.  KX MODIFIER AFTER 15 VISITS.  PROGRESS NOTE AT 10TH VISIT.    PT Start Time  1115    PT Stop Time  1210    PT Time Calculation (min)  55 min    Activity Tolerance  Patient tolerated treatment well    Behavior During Therapy  WFL for tasks assessed/performed       Past Medical History:  Diagnosis Date  . ALLERGIC RHINITIS CAUSE UNSPECIFIED 05/27/2010  . Arthritis   . BACK PAIN, UPPER 02/21/2010  . Cervicalgia 05/27/2010  . CHEST PAIN UNSPECIFIED 04/25/2009  . DIVERTICULOSIS, COLON 03/09/2007  . DIZZINESS, CHRONIC 03/09/2007  . GERD 12/24/2006  . HEADACHE, SINUS 04/06/2009  . History of colon polyps   . HYPERLIPIDEMIA 12/24/2006   diet controlled  . HYPERTENSION 12/24/2006   better after weight loss  . INSOMNIA WITH SLEEP APNEA UNSPECIFIED 05/27/2010  . Motion sickness   . Neuropathy   . Open wound of wrist, complicated 10/22/9561  . OSTEOARTHRITIS 12/24/2006  . Other constipation 11/06/2009  . Pneumonia   . PONV (postoperative nausea and vomiting)   . PROSTATITIS, RECURRENT 12/30/2007  . SHOULDER PAIN, BILATERAL 07/16/2007  . Sleep apnea   . Squamous cell carcinoma, ear, left   . TRANSIENT ISCHEMIC ATTACKS, HX OF 04/06/2009    Past Surgical History:  Procedure Laterality Date  . ANKLE ARTHROSCOPY W/ OPEN REPAIR  2002   Repair  . COLONOSCOPY    . COLONOSCOPY     around 2017. [place]   . CYST EXCISION     from back 2015. likely this was meaning of ear cyst  . EAR CYST EXCISION Bilateral  01/18/2013   Procedure: EXCISION CYSTS FROM BACK (TWO);  Surgeon: Earnstine Regal, MD;  Location: [place];  Service: General;  Laterality: Bilateral;  . epidydmal cyst excision    . ESOPHAGOGASTRODUODENOSCOPY     [place] around 2017  . REPLACEMENT TOTAL KNEE BILATERAL  2005/2007  . SKIN CANCER EXCISION  2020   ear, squamous cell  . TONSILLECTOMY      There were no vitals filed for this visit.  Subjective Assessment - 09/16/18 1240    Subjective  No new complaints.    Diagnostic tests  X-ray.  May be getting an MRI soon.    Patient Stated Goals  Use left UE without pain.    Currently in Pain?  Yes    Pain Score  8     Pain Location  Shoulder    Pain Orientation  Left    Pain Descriptors / Indicators  Aching;Sharp;Throbbing    Pain Type  Acute pain    Pain Onset  More than a month ago                       [facility] Adult PT Treatment/Exercise - 09/16/18 0001      Modalities   Modalities  Electrical Stimulation;Ultrasound;Vasopneumatic  Acupuncturist Location  Left shoulder.    Electrical Stimulation Action  IFC    Electrical Stimulation Parameters  80-150 Hz x 20 minutes.    Electrical Stimulation Goals  Pain      Ultrasound   Ultrasound Location  Left posterior cuff.    Ultrasound Parameters  Combo e'stim/U/S at 1.50 W/CM2 x 12 minutes.      Vasopneumatic   Number Minutes Vasopneumatic   20 minutes    Vasopnuematic Location   --   Left shoulder.   Vasopneumatic Pressure  Medium      Manual Therapy   Manual Therapy  Soft tissue mobilization    Soft tissue mobilization  STW/M x 12 minutes to affected left shoulder musculature.               PT Short Term Goals - 09/13/18 1201      PT SHORT TERM GOAL #1   Title  STG's=LTG's.        PT Long Term Goals - 09/13/18 1201      PT LONG TERM GOAL #1   Title  Independent with a HEP.    Time  6    Period  Weeks    Status  New      PT  LONG TERM GOAL #2   Title  Active left shoulder flexion to 150 degrees so the patient can easily reach overhead.    Time  6    Period  Weeks    Status  New      PT LONG TERM GOAL #3   Title  Increase left shoulder ER strength to a solid 4+/5 to increase stability for performance of functional activities.    Time  6    Period  Weeks    Status  New      PT LONG TERM GOAL #4   Title  Perform ADL's with left shoulder pain not > 2-3/10.    Time  6    Period  Weeks    Status  New            Plan - 09/16/18 1249    Clinical Impression Statement  Patient responded very well to treatment.  He continues to be palpably tender in left RTC and scapular musculature.    PT Treatment/Interventions  ADLs/Self Care Home Management;Cryotherapy;Electrical Stimulation;Moist Heat;Ultrasound;Therapeutic exercise;Therapeutic activities;Patient/family education;Manual techniques;Passive range of motion;Dry needling;Vasopneumatic Device;Joint Manipulations    PT Next Visit Plan  Combo e'stim/U/S; STW/M, IASTM, UBE, RTC/scapular exercises.  SDLY ER, Full can.    Consulted and Agree with Plan of Care  Patient       Patient will benefit from skilled therapeutic intervention in order to improve the following deficits and impairments:     Visit Diagnosis: Acute pain of left shoulder  Muscle weakness (generalized)     Problem List Patient Active Problem List   Diagnosis Date Noted  . 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 Tremor 04/10/2014  . Elevated hemoglobin A1c 04/10/2014  . Solitary pulmonary nodule 04/10/2014  . Constipation, slow transit 08/14/2010  . Allergic rhinitis 05/27/2010  . Cervicalgia 05/27/2010  . Insomnia with sleep apnea 05/27/2010  . HEADACHE, SINUS 04/06/2009  . SHOULDER PAIN, BILATERAL 07/16/2007  . DIZZINESS, CHRONIC 03/09/2007  . Hyperlipidemia 12/24/2006  . Essential hypertension 12/24/2006  . GERD 12/24/2006  . Osteoarthritis 12/24/2006    Dorrie Cocuzza, [place]  MPT 09/16/2018, 12:53 PM  [facility] [place], [place], [address] Phone:  [phone]   Fax:  [phone]  Name: [name] MRN: [id number] Date of Birth: 1945/11/19

## 2018-09-20 ENCOUNTER — Ambulatory Visit
Admission: RE | Admit: 2018-09-20 | Discharge: 2018-09-20 | Disposition: A | Discharge status: Home / Self Care | Payer: Medicare Other | Source: Ambulatory Visit | Attending: Rehabilitation | Admitting: Rehabilitation

## 2018-09-20 ENCOUNTER — Other Ambulatory Visit: Payer: Self-pay

## 2018-09-20 ENCOUNTER — Ambulatory Visit: Payer: Medicare Other | Admitting: Physical Therapy

## 2018-09-20 DIAGNOSIS — S46002D Unspecified injury of muscle(s) and tendon(s) of the rotator cuff of left shoulder, subsequent encounter: Secondary | ICD-10-CM

## 2018-09-20 DIAGNOSIS — R202 Paresthesia of skin: Secondary | ICD-10-CM | POA: Diagnosis not present

## 2018-09-20 NOTE — Telephone Encounter (Signed)
I have spoken to patient to advise that we have cancelled his barium swallow for now (ok to hold off for now per Dr Hilarie Fredrickson as Dr Bryan Lemma is out of office and pt test is scheduled for tomorrow). We have also cancelled office visit scheduled with Dr Bryan Lemma for 09/27/18 since it is to follow barium swallow and discuss operative management of reflux with possible TIF. Patient verbalizes understanding and we will reschedule as soon as allowed.

## 2018-09-21 ENCOUNTER — Ambulatory Visit (HOSPITAL_COMMUNITY): Payer: Medicare Other

## 2018-09-23 ENCOUNTER — Encounter: Payer: Medicare Other | Admitting: *Deleted

## 2018-09-27 ENCOUNTER — Ambulatory Visit: Payer: Medicare Other | Admitting: Gastroenterology

## 2018-09-27 ENCOUNTER — Telehealth: Payer: Self-pay | Admitting: *Deleted

## 2018-09-27 NOTE — Telephone Encounter (Signed)
Questions for Screening COVID-19  Symptom onset: N/A   Travel or Contacts: NO   During this illness, did/does the patient experience any of the following symptoms? Fever >100.52F []   Yes [x]   No []   Unknown Subjective fever (felt feverish) []   Yes []   No []   Unknown Chills []   Yes [x]   No []   Unknown Muscle aches (myalgia) []   Yes [x]   No []   Unknown Runny nose (rhinorrhea) []   Yes [x]   No []   Unknown Sore throat []   Yes [x]   No []   Unknown Cough (new onset or worsening of chronic cough) []   Yes [x]   No []   Unknown Shortness of breath (dyspnea) []   Yes [x]   No []   Unknown Nausea or vomiting []   Yes [x]   No []   Unknown Headache []   Yes [x]   No []   Unknown Abdominal pain  []   Yes [x]   No []   Unknown Diarrhea (?3 loose/looser than normal stools/24hr period) []   Yes [x]   No []   Unknown Other, specify:  Patient risk factors: Smoker? []   Current []   Former [x]   Never If male, currently pregnant? []   Yes []   No  Patient Active Problem List   Diagnosis Date Noted  . 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 Tremor 04/10/2014  . Elevated hemoglobin A1c 04/10/2014  . Solitary pulmonary nodule 04/10/2014  . Constipation, slow transit 08/14/2010  . Allergic rhinitis 05/27/2010  . Cervicalgia 05/27/2010  . Insomnia with sleep apnea 05/27/2010  . HEADACHE, SINUS 04/06/2009  . SHOULDER PAIN, BILATERAL 07/16/2007  . DIZZINESS, CHRONIC 03/09/2007  . Hyperlipidemia 12/24/2006  . Essential hypertension 12/24/2006  . GERD 12/24/2006  . Osteoarthritis 12/24/2006    Plan:  No Risk- patient will come in for B-12 injection 09/28/2018 at 11AM.   Note: Referral to telemedicine is an appropriate alternative disposition for higher risk but stable. [facility]/e-Visit: [phone].

## 2018-09-28 ENCOUNTER — Other Ambulatory Visit: Payer: Medicare Other

## 2018-09-28 ENCOUNTER — Ambulatory Visit (INDEPENDENT_AMBULATORY_CARE_PROVIDER_SITE_OTHER): Payer: Medicare Other | Admitting: *Deleted

## 2018-09-28 ENCOUNTER — Telehealth: Payer: Self-pay | Admitting: Family Medicine

## 2018-09-28 ENCOUNTER — Other Ambulatory Visit: Payer: Self-pay

## 2018-09-28 DIAGNOSIS — E538 Deficiency of other specified B group vitamins: Secondary | ICD-10-CM | POA: Diagnosis not present

## 2018-09-28 MED ORDER — CYANOCOBALAMIN 1000 MCG/ML IJ SOLN
1000.0000 ug | Freq: Once | INTRAMUSCULAR | Status: AC
  Administered 2018-09-28: 1000 ug via INTRAMUSCULAR

## 2018-09-28 NOTE — Telephone Encounter (Signed)
Patient needs to know what to do about his B12.  It's my understanding that he needs to switch to oral form while patients aren't coming into the office.

## 2018-09-28 NOTE — Progress Notes (Signed)
Per orders of Dr. Elease Hashimoto, injection of Vit B12 given by Nyoka Cowden, ASHTYN M. Patient tolerated injection well.

## 2018-09-28 NOTE — Telephone Encounter (Signed)
Please advise is this correct?

## 2018-09-29 ENCOUNTER — Telehealth: Payer: Self-pay | Admitting: Physical Therapy

## 2018-09-29 ENCOUNTER — Other Ambulatory Visit: Payer: Self-pay | Admitting: Family Medicine

## 2018-09-29 MED ORDER — CYANOCOBALAMIN 1000 MCG SL SUBL: 1000.0000 ug | SUBLINGUAL_TABLET | Freq: Every day | SUBLINGUAL | 0 refills | Status: DC

## 2018-09-29 NOTE — Telephone Encounter (Signed)
Spoke with patient, he expressed understanding.

## 2018-09-29 NOTE — Telephone Encounter (Signed)
I sent in sublingual tablets although you can get these otc. We will be using these in place of injections during this time. I would suggest we recheck levels in about 3 months to make sure they are doing better.

## 2018-09-29 NOTE — Telephone Encounter (Signed)
No follow up needed

## 2018-09-30 DIAGNOSIS — M50122 Cervical disc disorder at C5-C6 level with radiculopathy: Secondary | ICD-10-CM | POA: Diagnosis not present

## 2018-09-30 DIAGNOSIS — M47812 Spondylosis without myelopathy or radiculopathy, cervical region: Secondary | ICD-10-CM | POA: Diagnosis not present

## 2018-10-13 ENCOUNTER — Ambulatory Visit: Payer: Medicare Other | Admitting: Hematology & Oncology

## 2018-10-13 ENCOUNTER — Other Ambulatory Visit: Payer: Medicare Other

## 2018-10-15 ENCOUNTER — Ambulatory Visit: Payer: Medicare Other | Admitting: Hematology

## 2018-10-15 ENCOUNTER — Other Ambulatory Visit: Payer: Medicare Other

## 2018-10-19 ENCOUNTER — Ambulatory Visit (HOSPITAL_COMMUNITY): Payer: Medicare Other

## 2018-10-20 ENCOUNTER — Other Ambulatory Visit: Payer: Medicare Other

## 2018-10-21 NOTE — Telephone Encounter (Signed)
BS has been rescheduled for 12/03/18

## 2018-10-22 ENCOUNTER — Other Ambulatory Visit: Payer: Self-pay | Admitting: Family Medicine

## 2018-10-22 DIAGNOSIS — K219 Gastro-esophageal reflux disease without esophagitis: Secondary | ICD-10-CM

## 2018-10-29 ENCOUNTER — Ambulatory Visit: Payer: Medicare Other

## 2018-10-29 ENCOUNTER — Ambulatory Visit: Payer: Medicare Other | Admitting: Gastroenterology

## 2018-11-23 ENCOUNTER — Telehealth: Payer: Self-pay | Admitting: Physical Therapy

## 2018-11-23 NOTE — Telephone Encounter (Signed)
[name] was contacted in regards to resuming therapy. Left voicemail for patient to give our office a call back.

## 2018-11-24 ENCOUNTER — Other Ambulatory Visit: Payer: Self-pay

## 2018-11-24 ENCOUNTER — Ambulatory Visit: Payer: Medicare Other | Attending: Rehabilitation | Admitting: Physical Therapy

## 2018-11-24 DIAGNOSIS — M25512 Pain in left shoulder: Secondary | ICD-10-CM

## 2018-11-24 DIAGNOSIS — M6281 Muscle weakness (generalized): Secondary | ICD-10-CM

## 2018-11-24 NOTE — Therapy (Signed)
[facility] [place], [place], [address] Phone: [phone]   Fax:  [phone]  Physical Therapy Treatment  Patient Details  Name: [name] MRN: [id number] Date of Birth: Dec 10, 1945 Referring Provider (PT): Zonia Kief MD   Encounter Date: 11/24/2018  PT End of Session - 11/24/18 1100    Date for PT Re-Evaluation  12/20/18    Authorization Type  FOTO AT LEAST EVERY 5TH VISIT.  KX MODIFIER AFTER 15 VISITS.  PROGRESS NOTE AT 10TH VISIT.    PT Start Time  0915    PT Stop Time  1020    PT Time Calculation (min)  65 min    Activity Tolerance  Patient tolerated treatment well    Behavior During Therapy  WFL for tasks assessed/performed       Past Medical History:  Diagnosis Date  . ALLERGIC RHINITIS CAUSE UNSPECIFIED 05/27/2010  . Arthritis   . BACK PAIN, UPPER 02/21/2010  . Cervicalgia 05/27/2010  . CHEST PAIN UNSPECIFIED 04/25/2009  . DIVERTICULOSIS, COLON 03/09/2007  . DIZZINESS, CHRONIC 03/09/2007  . GERD 12/24/2006  . HEADACHE, SINUS 04/06/2009  . History of colon polyps   . HYPERLIPIDEMIA 12/24/2006   diet controlled  . HYPERTENSION 12/24/2006   better after weight loss  . INSOMNIA WITH SLEEP APNEA UNSPECIFIED 05/27/2010  . Motion sickness   . Neuropathy   . Open wound of wrist, complicated 09/23/7122  . OSTEOARTHRITIS 12/24/2006  . Other constipation 11/06/2009  . Pneumonia   . PONV (postoperative nausea and vomiting)   . PROSTATITIS, RECURRENT 12/30/2007  . SHOULDER PAIN, BILATERAL 07/16/2007  . Sleep apnea   . Squamous cell carcinoma, ear, left   . TRANSIENT ISCHEMIC ATTACKS, HX OF 04/06/2009    Past Surgical History:  Procedure Laterality Date  . ANKLE ARTHROSCOPY W/ OPEN REPAIR  2002   Repair  . COLONOSCOPY    . COLONOSCOPY     around 2017. [place]   . CYST EXCISION     from back 2015. likely this was meaning of ear cyst  . EAR CYST EXCISION Bilateral 01/18/2013   Procedure: EXCISION CYSTS FROM BACK  (TWO);  Surgeon: Earnstine Regal, MD;  Location: [place];  Service: General;  Laterality: Bilateral;  . epidydmal cyst excision    . ESOPHAGOGASTRODUODENOSCOPY     [place] around 2017  . REPLACEMENT TOTAL KNEE BILATERAL  2005/2007  . SKIN CANCER EXCISION  2020   ear, squamous cell  . TONSILLECTOMY      There were no vitals filed for this visit.  Subjective Assessment - 11/24/18 1058    Subjective  COVID-19 screen performed prior to patient entering clinic.  Patient happy to be back in PT as his left shoulder has been bothering him a lot.  He has been trying acuncture.    Pertinent History  H/o TIA's, h/o spinal pain, OA and ankle surgery.    Diagnostic tests  X-ray.  May be getting an MRI soon.    Patient Stated Goals  Use left UE without pain.    Currently in Pain?  Yes    Pain Score  8     Pain Location  Shoulder    Pain Orientation  Left    Pain Descriptors / Indicators  Aching;Sharp;Throbbing    Pain Type  Acute pain    Pain Onset  More than a month ago  [facility] Adult PT Treatment/Exercise - 11/24/18 0001      Modalities   Modalities  Electrical Stimulation;Moist Heat;Ultrasound      Moist Heat Therapy   Number Minutes Moist Heat  20 Minutes    Moist Heat Location  --   Left UT/posterior cuff.     Acupuncturist Location  --   Left UT/posterior shoulder.   Electrical Stimulation Action  IFC    Electrical Stimulation Parameters  80-150 Hz x 20 minutes.    Electrical Stimulation Goals  Pain      Ultrasound   Ultrasound Location  Left post shoulder/UT.    Ultrasound Parameters  Combo e'stim/U/S at 1.50 W/CM2 x 12 minutes.    Ultrasound Goals  Pain      Manual Therapy   Manual Therapy  Soft tissue mobilization    Soft tissue mobilization  STW/M x 12 minutes to left UT/post cuff to reduce tone.               PT Short Term Goals - 09/13/18 1201      PT SHORT TERM GOAL #1    Title  STG's=LTG's.        PT Long Term Goals - 09/13/18 1201      PT LONG TERM GOAL #1   Title  Independent with a HEP.    Time  6    Period  Weeks    Status  New      PT LONG TERM GOAL #2   Title  Active left shoulder flexion to 150 degrees so the patient can easily reach overhead.    Time  6    Period  Weeks    Status  New      PT LONG TERM GOAL #3   Title  Increase left shoulder ER strength to a solid 4+/5 to increase stability for performance of functional activities.    Time  6    Period  Weeks    Status  New      PT LONG TERM GOAL #4   Title  Perform ADL's with left shoulder pain not > 2-3/10.    Time  6    Period  Weeks    Status  New            Plan - 11/24/18 1203    Clinical Impression Statement  Patient returning to PT today with high levels of left shoulder pain.  He demonstrated 125 degrees of left shoulder antigravity flexion.  He has a positive Impingement test with pain referred into left middle deltoid region.  He continues to have tenderness and trigger points over his left UT and posterior cuff.    Stability/Clinical Decision Making  Evolving/Moderate complexity    PT Treatment/Interventions  ADLs/Self Care Home Management;Cryotherapy;Electrical Stimulation;Moist Heat;Ultrasound;Therapeutic exercise;Therapeutic activities;Patient/family education;Manual techniques;Passive range of motion;Dry needling;Vasopneumatic Device;Joint Manipulations    PT Next Visit Plan  Combo e'stim/U/S; STW/M, IASTM, UBE, RTC/scapular exercises.  SDLY ER, Full can.    Consulted and Agree with Plan of Care  Patient       Patient will benefit from skilled therapeutic intervention in order to improve the following deficits and impairments:  Pain, Decreased activity tolerance, Decreased range of motion, Decreased strength  Visit Diagnosis: Acute pain of left shoulder - Plan: PT plan of care cert/re-cert  Muscle weakness (generalized) - Plan: PT plan of care  cert/re-cert     Problem List Patient Active Problem List   Diagnosis Date Noted  .  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 Tremor 04/10/2014  . Elevated hemoglobin A1c 04/10/2014  . Solitary pulmonary nodule 04/10/2014  . Constipation, slow transit 08/14/2010  . Allergic rhinitis 05/27/2010  . Cervicalgia 05/27/2010  . Insomnia with sleep apnea 05/27/2010  . HEADACHE, SINUS 04/06/2009  . SHOULDER PAIN, BILATERAL 07/16/2007  . DIZZINESS, CHRONIC 03/09/2007  . Hyperlipidemia 12/24/2006  . Essential hypertension 12/24/2006  . GERD 12/24/2006  . Osteoarthritis 12/24/2006    Jaquelinne Glendening, [name] 11/24/2018, 12:24 PM  [facility] [address] [place], [place], [address] Phone: [phone]   Fax:  [phone]  Name: [name] MRN: [id number] Date of Birth: 10/11/1945

## 2018-11-26 ENCOUNTER — Other Ambulatory Visit: Payer: Self-pay

## 2018-11-26 ENCOUNTER — Ambulatory Visit: Payer: Medicare Other | Admitting: Physical Therapy

## 2018-11-26 DIAGNOSIS — M6281 Muscle weakness (generalized): Secondary | ICD-10-CM | POA: Diagnosis not present

## 2018-11-26 DIAGNOSIS — M25512 Pain in left shoulder: Secondary | ICD-10-CM

## 2018-11-26 NOTE — Therapy (Signed)
[facility] [place], [place], [address] Phone: [phone]   Fax:  [phone]  Physical Therapy Treatment  Patient Details  Name: [name] MRN: [id number] Date of Birth: [birth year] Referring Provider (PT): Zonia Kief MD   Encounter Date: 11/26/2018  PT End of Session - 11/26/18 1113    Visit Number  4    Number of Visits  12    Date for PT Re-Evaluation  12/20/18    Authorization Type  FOTO AT LEAST EVERY 5TH VISIT.  KX MODIFIER AFTER 15 VISITS.  PROGRESS NOTE AT 10TH VISIT.    PT Start Time  0915    PT Stop Time  1010    PT Time Calculation (min)  55 min    Activity Tolerance  Patient tolerated treatment well    Behavior During Therapy  WFL for tasks assessed/performed       Past Medical History:  Diagnosis Date  . ALLERGIC RHINITIS CAUSE UNSPECIFIED 05/27/2010  . Arthritis   . BACK PAIN, UPPER 02/21/2010  . Cervicalgia 05/27/2010  . CHEST PAIN UNSPECIFIED 04/25/2009  . DIVERTICULOSIS, COLON 03/09/2007  . DIZZINESS, CHRONIC 03/09/2007  . GERD 12/24/2006  . HEADACHE, SINUS 04/06/2009  . History of colon polyps   . HYPERLIPIDEMIA 12/24/2006   diet controlled  . HYPERTENSION 12/24/2006   better after weight loss  . INSOMNIA WITH SLEEP APNEA UNSPECIFIED 05/27/2010  . Motion sickness   . Neuropathy   . Open wound of wrist, complicated 10/07/7351  . OSTEOARTHRITIS 12/24/2006  . Other constipation 11/06/2009  . Pneumonia   . PONV (postoperative nausea and vomiting)   . PROSTATITIS, RECURRENT 12/30/2007  . SHOULDER PAIN, BILATERAL 07/16/2007  . Sleep apnea   . Squamous cell carcinoma, ear, left   . TRANSIENT ISCHEMIC ATTACKS, HX OF 04/06/2009    Past Surgical History:  Procedure Laterality Date  . ANKLE ARTHROSCOPY W/ OPEN REPAIR  2002   Repair  . COLONOSCOPY    . COLONOSCOPY     around 2017. [place]   . CYST EXCISION     from back 2015. likely this was meaning of ear cyst  . EAR CYST EXCISION Bilateral  01/18/2013   Procedure: EXCISION CYSTS FROM BACK (TWO);  Surgeon: Earnstine Regal, MD;  Location: [place];  Service: General;  Laterality: Bilateral;  . epidydmal cyst excision    . ESOPHAGOGASTRODUODENOSCOPY     [place] around 2017  . REPLACEMENT TOTAL KNEE BILATERAL  2005/2007  . SKIN CANCER EXCISION  2020   ear, squamous cell  . TONSILLECTOMY      There were no vitals filed for this visit.  Subjective Assessment - 11/26/18 0956    Subjective  COVID-19 screen performed prior to patient entering clinic.  I feel better since that last treatment.  I slept better.    Pertinent History  H/o TIA's, h/o spinal pain, OA and ankle surgery.    Diagnostic tests  X-ray.  May be getting an MRI soon.    Patient Stated Goals  Use left UE without pain.    Currently in Pain?  Yes    Pain Score  5     Pain Location  Shoulder    Pain Orientation  Left    Pain Descriptors / Indicators  Aching;Sharp;Throbbing    Pain Onset  More than a month ago  [facility] Adult PT Treatment/Exercise - 11/26/18 0001      Modalities   Modalities  Electrical Stimulation;Moist Heat;Ultrasound      Moist Heat Therapy   Number Minutes Moist Heat  20 Minutes    Moist Heat Location  --   Left shoulder.     Acupuncturist Location  --   Left shoulder.   Electrical Stimulation Action  IFC    Electrical Stimulation Parameters  80-150 Hz x 20 minutes.    Electrical Stimulation Goals  Pain      Ultrasound   Ultrasound Location  Left post/shoulder/UT    Ultrasound Parameters  Combo e'stim/U/S at 1.50 W/CM2 x 12 minutes.    Ultrasound Goals  Pain      Manual Therapy   Manual Therapy  Soft tissue mobilization    Soft tissue mobilization  STW/M x 15 minutes to left post cuff/UT.               PT Short Term Goals - 09/13/18 1201      PT SHORT TERM GOAL #1   Title  STG's=LTG's.        PT Long Term Goals - 09/13/18 1201       PT LONG TERM GOAL #1   Title  Independent with a HEP.    Time  6    Period  Weeks    Status  New      PT LONG TERM GOAL #2   Title  Active left shoulder flexion to 150 degrees so the patient can easily reach overhead.    Time  6    Period  Weeks    Status  New      PT LONG TERM GOAL #3   Title  Increase left shoulder ER strength to a solid 4+/5 to increase stability for performance of functional activities.    Time  6    Period  Weeks    Status  New      PT LONG TERM GOAL #4   Title  Perform ADL's with left shoulder pain not > 2-3/10.    Time  6    Period  Weeks    Status  New            Plan - 11/26/18 1157    Clinical Impression Statement  Patient responding well to treatments and already reposring a reduction in pain and sleeping better.       Patient will benefit from skilled therapeutic intervention in order to improve the following deficits and impairments:     Visit Diagnosis: Acute pain of left shoulder  Muscle weakness (generalized)     Problem List Patient Active Problem List   Diagnosis Date Noted  . 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 Tremor 04/10/2014  . Elevated hemoglobin A1c 04/10/2014  . Solitary pulmonary nodule 04/10/2014  . Constipation, slow transit 08/14/2010  . Allergic rhinitis 05/27/2010  . Cervicalgia 05/27/2010  . Insomnia with sleep apnea 05/27/2010  . HEADACHE, SINUS 04/06/2009  . SHOULDER PAIN, BILATERAL 07/16/2007  . DIZZINESS, CHRONIC 03/09/2007  . Hyperlipidemia 12/24/2006  . Essential hypertension 12/24/2006  . GERD 12/24/2006  . Osteoarthritis 12/24/2006    [name], [name] 11/26/2018, 11:58 AM  [facility] [address] [place], [place], [address] Phone: [phone]   Fax:  [phone]  Name: [name] MRN: [id number] Date of Birth: Apr 25, 1946

## 2018-11-29 ENCOUNTER — Ambulatory Visit: Payer: Medicare Other | Attending: Rehabilitation | Admitting: Physical Therapy

## 2018-11-29 ENCOUNTER — Other Ambulatory Visit: Payer: Self-pay

## 2018-11-29 DIAGNOSIS — M6281 Muscle weakness (generalized): Secondary | ICD-10-CM | POA: Diagnosis not present

## 2018-11-29 DIAGNOSIS — G8929 Other chronic pain: Secondary | ICD-10-CM | POA: Insufficient documentation

## 2018-11-29 DIAGNOSIS — M25512 Pain in left shoulder: Secondary | ICD-10-CM

## 2018-11-29 NOTE — Therapy (Signed)
[facility] [place], [place], [address] Phone: [phone]   Fax:  [phone]  Physical Therapy Treatment  Patient Details  Name: [name] MRN: [id number] Date of Birth: April 02, 1946 Referring Provider (PT): Zonia Kief MD   Encounter Date: 11/29/2018  PT End of Session - 11/29/18 1153    Visit Number  5    Number of Visits  12    Date for PT Re-Evaluation  12/20/18    Authorization Type  FOTO AT LEAST EVERY 5TH VISIT.  KX MODIFIER AFTER 15 VISITS.  PROGRESS NOTE AT 10TH VISIT.    PT Start Time  0915    PT Stop Time  1010    PT Time Calculation (min)  55 min    Activity Tolerance  Patient tolerated treatment well    Behavior During Therapy  WFL for tasks assessed/performed       Past Medical History:  Diagnosis Date  . ALLERGIC RHINITIS CAUSE UNSPECIFIED 05/27/2010  . Arthritis   . BACK PAIN, UPPER 02/21/2010  . Cervicalgia 05/27/2010  . CHEST PAIN UNSPECIFIED 04/25/2009  . DIVERTICULOSIS, COLON 03/09/2007  . DIZZINESS, CHRONIC 03/09/2007  . GERD 12/24/2006  . HEADACHE, SINUS 04/06/2009  . History of colon polyps   . HYPERLIPIDEMIA 12/24/2006   diet controlled  . HYPERTENSION 12/24/2006   better after weight loss  . INSOMNIA WITH SLEEP APNEA UNSPECIFIED 05/27/2010  . Motion sickness   . Neuropathy   . Open wound of wrist, complicated 9/89/2119  . OSTEOARTHRITIS 12/24/2006  . Other constipation 11/06/2009  . Pneumonia   . PONV (postoperative nausea and vomiting)   . PROSTATITIS, RECURRENT 12/30/2007  . SHOULDER PAIN, BILATERAL 07/16/2007  . Sleep apnea   . Squamous cell carcinoma, ear, left   . TRANSIENT ISCHEMIC ATTACKS, HX OF 04/06/2009    Past Surgical History:  Procedure Laterality Date  . ANKLE ARTHROSCOPY W/ OPEN REPAIR  2002   Repair  . COLONOSCOPY    . COLONOSCOPY     around 2017. [place]   . CYST EXCISION     from back 2015. likely this was meaning of ear cyst  . EAR CYST EXCISION Bilateral  01/18/2013   Procedure: EXCISION CYSTS FROM BACK (TWO);  Surgeon: Earnstine Regal, MD;  Location: [place];  Service: General;  Laterality: Bilateral;  . epidydmal cyst excision    . ESOPHAGOGASTRODUODENOSCOPY     [place] around 2017  . REPLACEMENT TOTAL KNEE BILATERAL  2005/2007  . SKIN CANCER EXCISION  2020   ear, squamous cell  . TONSILLECTOMY      There were no vitals filed for this visit.  Subjective Assessment - 11/29/18 1153    Subjective  COVID-19 screen performed prior to patient entering clinic.                       [place] [facility] Adult PT Treatment/Exercise - 11/29/18 0001      Exercises   Exercises  Shoulder      Shoulder Exercises: ROM/Strengthening   UBE (Upper Arm Bike)  6 minutes at 90 RPM's (3 minutes forward and 3 minutes backward).      Modalities   Modalities  Electrical Stimulation;Moist Heat;Ultrasound      Moist Heat Therapy   Number Minutes Moist Heat  20 Minutes    Moist Heat Location  --   Left shoulder.     Acupuncturist Location  --   Left  shoulder.   Electrical Stimulation Action  IFC    Electrical Stimulation Parameters  80-150 Hz x 20 minutes.    Electrical Stimulation Goals  Pain      Ultrasound   Ultrasound Location  Left post shoulder.    Ultrasound Parameters  Combo e'stim/U/S at 1.50 W/CM2 x 7 minutes.    Ultrasound Goals  Pain      Manual Therapy   Manual Therapy  Soft tissue mobilization    Soft tissue mobilization  STW/M x 10 minutes to left UT/post cuff to reduce tone.               PT Short Term Goals - 09/13/18 1201      PT SHORT TERM GOAL #1   Title  STG's=LTG's.        PT Long Term Goals - 09/13/18 1201      PT LONG TERM GOAL #1   Title  Independent with a HEP.    Time  6    Period  Weeks    Status  New      PT LONG TERM GOAL #2   Title  Active left shoulder flexion to 150 degrees so the patient can easily reach overhead.    Time  6     Period  Weeks    Status  New      PT LONG TERM GOAL #3   Title  Increase left shoulder ER strength to a solid 4+/5 to increase stability for performance of functional activities.    Time  6    Period  Weeks    Status  New      PT LONG TERM GOAL #4   Title  Perform ADL's with left shoulder pain not > 2-3/10.    Time  6    Period  Weeks    Status  New            Plan - 11/29/18 1216    Clinical Impression Statement  Patient did well with UBE with no increase left shoulder pain.  UT region improving.  He continues to have increased pain and tone in left posterior cuff musculature.    PT Treatment/Interventions  ADLs/Self Care Home Management;Cryotherapy;Electrical Stimulation;Moist Heat;Ultrasound;Therapeutic exercise;Therapeutic activities;Patient/family education;Manual techniques;Passive range of motion;Dry needling;Vasopneumatic Device;Joint Manipulations    PT Next Visit Plan  Combo e'stim/U/S; STW/M, IASTM, UBE, RTC/scapular exercises.  SDLY ER, Full can.    Consulted and Agree with Plan of Care  Patient       Patient will benefit from skilled therapeutic intervention in order to improve the following deficits and impairments:  Pain, Decreased activity tolerance, Decreased range of motion, Decreased strength  Visit Diagnosis: Acute pain of left shoulder  Muscle weakness (generalized)     Problem List Patient Active Problem List   Diagnosis Date Noted  . 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 Tremor 04/10/2014  . Elevated hemoglobin A1c 04/10/2014  . Solitary pulmonary nodule 04/10/2014  . Constipation, slow transit 08/14/2010  . Allergic rhinitis 05/27/2010  . Cervicalgia 05/27/2010  . Insomnia with sleep apnea 05/27/2010  . HEADACHE, SINUS 04/06/2009  . SHOULDER PAIN, BILATERAL 07/16/2007  . DIZZINESS, CHRONIC 03/09/2007  . Hyperlipidemia 12/24/2006  . Essential hypertension 12/24/2006  . GERD 12/24/2006  . Osteoarthritis 12/24/2006    [name], Charles  MPT 11/29/2018, 12:18 PM  [facility] [address] [place], [place], [address] Phone: [phone]   Fax:  [phone]  Name: [name] MRN: [id number] Date of Birth:  01/07/1946   

## 2018-12-01 ENCOUNTER — Ambulatory Visit (HOSPITAL_COMMUNITY)
Admission: RE | Admit: 2018-12-01 | Discharge: 2018-12-01 | Disposition: A | Discharge status: Home / Self Care | Payer: Medicare Other | Source: Ambulatory Visit | Attending: Gastroenterology | Admitting: Gastroenterology

## 2018-12-01 ENCOUNTER — Other Ambulatory Visit: Payer: Self-pay

## 2018-12-01 DIAGNOSIS — K219 Gastro-esophageal reflux disease without esophagitis: Secondary | ICD-10-CM | POA: Diagnosis not present

## 2018-12-01 DIAGNOSIS — K449 Diaphragmatic hernia without obstruction or gangrene: Secondary | ICD-10-CM | POA: Diagnosis not present

## 2018-12-02 ENCOUNTER — Ambulatory Visit
Admission: RE | Admit: 2018-12-02 | Discharge: 2018-12-02 | Disposition: A | Discharge status: Home / Self Care | Payer: Medicare Other | Source: Ambulatory Visit | Attending: Rehabilitation | Admitting: Rehabilitation

## 2018-12-02 ENCOUNTER — Ambulatory Visit
Admission: RE | Admit: 2018-12-02 | Discharge: 2018-12-02 | Disposition: A | Discharge status: Home / Self Care | Payer: Non-veteran care | Source: Ambulatory Visit | Attending: Rehabilitation | Admitting: Rehabilitation

## 2018-12-02 DIAGNOSIS — S43431A Superior glenoid labrum lesion of right shoulder, initial encounter: Secondary | ICD-10-CM | POA: Diagnosis not present

## 2018-12-02 DIAGNOSIS — S46002D Unspecified injury of muscle(s) and tendon(s) of the rotator cuff of left shoulder, subsequent encounter: Secondary | ICD-10-CM

## 2018-12-02 MED ORDER — IOPAMIDOL (ISOVUE-M 200) INJECTION 41%
15.0000 mL | Freq: Once | INTRAMUSCULAR | Status: AC
  Administered 2018-12-02: 15 mL via INTRA_ARTICULAR

## 2018-12-03 ENCOUNTER — Encounter: Payer: 59 | Admitting: Physical Therapy

## 2018-12-06 ENCOUNTER — Encounter: Payer: Self-pay | Admitting: Physical Therapy

## 2018-12-06 ENCOUNTER — Other Ambulatory Visit: Payer: Self-pay

## 2018-12-06 ENCOUNTER — Ambulatory Visit: Payer: Medicare Other | Admitting: Physical Therapy

## 2018-12-06 DIAGNOSIS — M6281 Muscle weakness (generalized): Secondary | ICD-10-CM | POA: Diagnosis not present

## 2018-12-06 DIAGNOSIS — G8929 Other chronic pain: Secondary | ICD-10-CM | POA: Diagnosis not present

## 2018-12-06 DIAGNOSIS — M25512 Pain in left shoulder: Secondary | ICD-10-CM | POA: Diagnosis not present

## 2018-12-06 NOTE — Therapy (Signed)
[facility] [place], [place], [address] Phone: [phone]   Fax:  [phone]  Physical Therapy Treatment  Patient Details  Name: [name] MRN: [id number] Date of Birth: 17-Jul-1945 Referring Provider (PT): Zonia Kief MD   Encounter Date: 12/06/2018  PT End of Session - 12/06/18 1225    Visit Number  6    Number of Visits  12    Date for PT Re-Evaluation  12/20/18    Authorization Type  FOTO AT LEAST EVERY 5TH VISIT.  KX MODIFIER AFTER 15 VISITS.  PROGRESS NOTE AT 10TH VISIT.    PT Start Time  1112    PT Stop Time  1209    PT Time Calculation (min)  57 min    Activity Tolerance  Patient tolerated treatment well    Behavior During Therapy  WFL for tasks assessed/performed       Past Medical History:  Diagnosis Date  . ALLERGIC RHINITIS CAUSE UNSPECIFIED 05/27/2010  . Arthritis   . BACK PAIN, UPPER 02/21/2010  . Cervicalgia 05/27/2010  . CHEST PAIN UNSPECIFIED 04/25/2009  . DIVERTICULOSIS, COLON 03/09/2007  . DIZZINESS, CHRONIC 03/09/2007  . GERD 12/24/2006  . HEADACHE, SINUS 04/06/2009  . History of colon polyps   . HYPERLIPIDEMIA 12/24/2006   diet controlled  . HYPERTENSION 12/24/2006   better after weight loss  . INSOMNIA WITH SLEEP APNEA UNSPECIFIED 05/27/2010  . Motion sickness   . Neuropathy   . Open wound of wrist, complicated 5/36/6440  . OSTEOARTHRITIS 12/24/2006  . Other constipation 11/06/2009  . Pneumonia   . PONV (postoperative nausea and vomiting)   . PROSTATITIS, RECURRENT 12/30/2007  . SHOULDER PAIN, BILATERAL 07/16/2007  . Sleep apnea   . Squamous cell carcinoma, ear, left   . TRANSIENT ISCHEMIC ATTACKS, HX OF 04/06/2009    Past Surgical History:  Procedure Laterality Date  . ANKLE ARTHROSCOPY W/ OPEN REPAIR  2002   Repair  . COLONOSCOPY    . COLONOSCOPY     around 2017. [place]   . CYST EXCISION     from back 2015. likely this was meaning of ear cyst  . EAR CYST EXCISION Bilateral  01/18/2013   Procedure: EXCISION CYSTS FROM BACK (TWO);  Surgeon: Earnstine Regal, MD;  Location: [place];  Service: General;  Laterality: Bilateral;  . epidydmal cyst excision    . ESOPHAGOGASTRODUODENOSCOPY     [place] around 2017  . REPLACEMENT TOTAL KNEE BILATERAL  2005/2007  . SKIN CANCER EXCISION  2020   ear, squamous cell  . TONSILLECTOMY      There were no vitals filed for this visit.  Subjective Assessment - 12/06/18 1206    Subjective  COVID-19 screen performed prior to patient entering clinic.  Treatments are helping.    Pertinent History  H/o TIA's, h/o spinal pain, OA and ankle surgery.    Diagnostic tests  X-ray.  May be getting an MRI soon.    Patient Stated Goals  Use left UE without pain.    Currently in Pain?  Yes    Pain Score  4     Pain Location  Shoulder    Pain Orientation  Left    Pain Descriptors / Indicators  Aching;Sharp;Throbbing    Pain Type  Acute pain    Pain Onset  More than a month ago  [facility] Adult PT Treatment/Exercise - 12/06/18 0001      Modalities   Modalities  Electrical Stimulation;Moist Heat;Ultrasound      Moist Heat Therapy   Number Minutes Moist Heat  20 Minutes    Moist Heat Location  --   Left shoulder.     Acupuncturist Location  --   Left posterior shoulder   Electrical Stimulation Action  IFC    Electrical Stimulation Parameters  80-150 Hz x 20 minutes.      Ultrasound   Ultrasound Location  --   Left post shoulder.   Ultrasound Parameters  Combo e'stim/U/S at 1.50 W/CM2 x 12 minutes.    Ultrasound Goals  Pain      Manual Therapy   Manual Therapy  Soft tissue mobilization    Soft tissue mobilization  STW/M x 12 minutes to patient's left posterior cuff region.               PT Short Term Goals - 09/13/18 1201      PT SHORT TERM GOAL #1   Title  STG's=LTG's.        PT Long Term Goals - 09/13/18 1201      PT LONG  TERM GOAL #1   Title  Independent with a HEP.    Time  6    Period  Weeks    Status  New      PT LONG TERM GOAL #2   Title  Active left shoulder flexion to 150 degrees so the patient can easily reach overhead.    Time  6    Period  Weeks    Status  New      PT LONG TERM GOAL #3   Title  Increase left shoulder ER strength to a solid 4+/5 to increase stability for performance of functional activities.    Time  6    Period  Weeks    Status  New      PT LONG TERM GOAL #4   Title  Perform ADL's with left shoulder pain not > 2-3/10.    Time  6    Period  Weeks    Status  New            Plan - 12/06/18 1223    Clinical Impression Statement  Patient doing well with treatments and overall stating his shoulder is feeling consistently better.    PT Treatment/Interventions  ADLs/Self Care Home Management;Cryotherapy;Electrical Stimulation;Moist Heat;Ultrasound;Therapeutic exercise;Therapeutic activities;Patient/family education;Manual techniques;Passive range of motion;Dry needling;Vasopneumatic Device;Joint Manipulations    PT Next Visit Plan  Combo e'stim/U/S; STW/M, IASTM, UBE, RTC/scapular exercises.  SDLY ER, Full can.    Consulted and Agree with Plan of Care  Patient       Patient will benefit from skilled therapeutic intervention in order to improve the following deficits and impairments:     Visit Diagnosis: Acute pain of left shoulder  Muscle weakness (generalized)     Problem List Patient Active Problem List   Diagnosis Date Noted  . 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 Tremor 04/10/2014  . Elevated hemoglobin A1c 04/10/2014  . Solitary pulmonary nodule 04/10/2014  . Constipation, slow transit 08/14/2010  . Allergic rhinitis 05/27/2010  . Cervicalgia 05/27/2010  . Insomnia with sleep apnea 05/27/2010  . HEADACHE, SINUS 04/06/2009  . SHOULDER PAIN, BILATERAL 07/16/2007  . DIZZINESS, CHRONIC 03/09/2007  . Hyperlipidemia 12/24/2006  . Essential hypertension  12/24/2006  . GERD 12/24/2006  . Osteoarthritis 12/24/2006  [name], [name] 12/06/2018, 12:26 PM  [facility] [address] [place], [place], [address] Phone: [phone]   Fax:  [phone]  Name: [name] MRN: [id number] Date of Birth: 04-Apr-1946

## 2018-12-09 ENCOUNTER — Ambulatory Visit: Payer: Medicare Other | Admitting: Physical Therapy

## 2018-12-09 ENCOUNTER — Other Ambulatory Visit: Payer: Self-pay

## 2018-12-09 DIAGNOSIS — M6281 Muscle weakness (generalized): Secondary | ICD-10-CM

## 2018-12-09 DIAGNOSIS — M25512 Pain in left shoulder: Secondary | ICD-10-CM

## 2018-12-09 DIAGNOSIS — G8929 Other chronic pain: Secondary | ICD-10-CM | POA: Diagnosis not present

## 2018-12-09 NOTE — Therapy (Signed)
[facility] [place], [place], [address] Phone: [phone]   Fax:  [phone]  Physical Therapy Treatment  Patient Details  Name: [name] MRN: [id number] Date of Birth: [birth year] Referring Provider (PT): Zonia Kief MD   Encounter Date: 12/09/2018  PT End of Session - 12/09/18 1206    Visit Number  7    Number of Visits  12    Date for PT Re-Evaluation  12/20/18    Authorization Type  FOTO AT LEAST EVERY 5TH VISIT.  KX MODIFIER AFTER 15 VISITS.  PROGRESS NOTE AT 10TH VISIT.    PT Start Time  1015    PT Stop Time  1123    PT Time Calculation (min)  68 min    Activity Tolerance  Patient tolerated treatment well    Behavior During Therapy  WFL for tasks assessed/performed       Past Medical History:  Diagnosis Date  . ALLERGIC RHINITIS CAUSE UNSPECIFIED 05/27/2010  . Arthritis   . BACK PAIN, UPPER 02/21/2010  . Cervicalgia 05/27/2010  . CHEST PAIN UNSPECIFIED 04/25/2009  . DIVERTICULOSIS, COLON 03/09/2007  . DIZZINESS, CHRONIC 03/09/2007  . GERD 12/24/2006  . HEADACHE, SINUS 04/06/2009  . History of colon polyps   . HYPERLIPIDEMIA 12/24/2006   diet controlled  . HYPERTENSION 12/24/2006   better after weight loss  . INSOMNIA WITH SLEEP APNEA UNSPECIFIED 05/27/2010  . Motion sickness   . Neuropathy   . Open wound of wrist, complicated 2/53/6644  . OSTEOARTHRITIS 12/24/2006  . Other constipation 11/06/2009  . Pneumonia   . PONV (postoperative nausea and vomiting)   . PROSTATITIS, RECURRENT 12/30/2007  . SHOULDER PAIN, BILATERAL 07/16/2007  . Sleep apnea   . Squamous cell carcinoma, ear, left   . TRANSIENT ISCHEMIC ATTACKS, HX OF 04/06/2009    Past Surgical History:  Procedure Laterality Date  . ANKLE ARTHROSCOPY W/ OPEN REPAIR  2002   Repair  . COLONOSCOPY    . COLONOSCOPY     around 2017. [place]   . CYST EXCISION     from back 2015. likely this was meaning of ear cyst  . EAR CYST EXCISION Bilateral  01/18/2013   Procedure: EXCISION CYSTS FROM BACK (TWO);  Surgeon: Earnstine Regal, MD;  Location: [place];  Service: General;  Laterality: Bilateral;  . epidydmal cyst excision    . ESOPHAGOGASTRODUODENOSCOPY     [place] around 2017  . REPLACEMENT TOTAL KNEE BILATERAL  2005/2007  . SKIN CANCER EXCISION  2020   ear, squamous cell  . TONSILLECTOMY      There were no vitals filed for this visit.  Subjective Assessment - 12/09/18 1228    Subjective  COVID-19 screen performed prior to patient entering clinic.  I'm better.                       [facility] Adult PT Treatment/Exercise - 12/09/18 0001      Exercises   Exercises  Shoulder      Shoulder Exercises: Standing   External Rotation Limitations  Red theraband ER to fatigue.    Internal Rotation Limitations  Red theraband IR to fatigue.    Other Standing Exercises  Corner stretch and wall climbs.      Shoulder Exercises: ROM/Strengthening   UBE (Upper Arm Bike)  8 minutes at 120 RPM's.      Modalities   Modalities  Electrical Stimulation;Moist Heat;Ultrasound      Moist  Heat Therapy   Number Minutes Moist Heat  20 Minutes    Moist Heat Location  --   Left shoulder.     Acupuncturist Location  --   Left shoulder.   Electrical Stimulation Action  IFC    Electrical Stimulation Parameters  80-150 Hz x 20 minutes.    Electrical Stimulation Goals  Pain      Ultrasound   Ultrasound Location  --   Left posterior and anterior shoulder   Ultrasound Parameters  Combo e'stim/U/S at 1.50 W/CM2 x 7 minutes.    Ultrasound Goals  Pain      Manual Therapy   Manual Therapy  Soft tissue mobilization    Soft tissue mobilization  STW/M x 9 minutes to patient's left post/ant shoulder region.               PT Short Term Goals - 09/13/18 1201      PT SHORT TERM GOAL #1   Title  STG's=LTG's.        PT Long Term Goals - 09/13/18 1201      PT LONG TERM GOAL #1    Title  Independent with a HEP.    Time  6    Period  Weeks    Status  New      PT LONG TERM GOAL #2   Title  Active left shoulder flexion to 150 degrees so the patient can easily reach overhead.    Time  6    Period  Weeks    Status  New      PT LONG TERM GOAL #3   Title  Increase left shoulder ER strength to a solid 4+/5 to increase stability for performance of functional activities.    Time  6    Period  Weeks    Status  New      PT LONG TERM GOAL #4   Title  Perform ADL's with left shoulder pain not > 2-3/10.    Time  6    Period  Weeks    Status  New            Plan - 12/09/18 1204    Clinical Impression Statement  The patient did great with PT today and the addition of stretches and strengthening exercises.  he was provided with red Theraband for a HEP program.    PT Next Visit Plan  Con't with left shoulder stretching and PRE's.       Patient will benefit from skilled therapeutic intervention in order to improve the following deficits and impairments:     Visit Diagnosis: Left shoulder pain.     Problem List Patient Active Problem List   Diagnosis Date Noted  . 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 Tremor 04/10/2014  . Elevated hemoglobin A1c 04/10/2014  . Solitary pulmonary nodule 04/10/2014  . Constipation, slow transit 08/14/2010  . Allergic rhinitis 05/27/2010  . Cervicalgia 05/27/2010  . Insomnia with sleep apnea 05/27/2010  . HEADACHE, SINUS 04/06/2009  . SHOULDER PAIN, BILATERAL 07/16/2007  . DIZZINESS, CHRONIC 03/09/2007  . Hyperlipidemia 12/24/2006  . Essential hypertension 12/24/2006  . GERD 12/24/2006  . Osteoarthritis 12/24/2006    [name], [name] 12/09/2018, 12:30 PM  [facility] [address] [place], [place], [address] Phone: [phone]   Fax:  [phone]  Name: [name] MRN: [id number] Date of Birth: October 24, 1945

## 2018-12-14 ENCOUNTER — Ambulatory Visit: Payer: Medicare Other | Admitting: Physical Therapy

## 2018-12-14 ENCOUNTER — Other Ambulatory Visit: Payer: Self-pay

## 2018-12-14 DIAGNOSIS — M6281 Muscle weakness (generalized): Secondary | ICD-10-CM | POA: Diagnosis not present

## 2018-12-14 DIAGNOSIS — M25512 Pain in left shoulder: Secondary | ICD-10-CM

## 2018-12-14 DIAGNOSIS — G8929 Other chronic pain: Secondary | ICD-10-CM | POA: Diagnosis not present

## 2018-12-14 NOTE — Therapy (Signed)
[facility] [place], [place], [address] Phone: [phone]   Fax:  [phone]  Physical Therapy Treatment  Patient Details  Name: [name] MRN: [id number] Date of Birth: [birth year] Referring Provider (PT): Zonia Kief MD   Encounter Date: 12/14/2018  PT End of Session - 12/14/18 1157    Visit Number  8    Number of Visits  12    Date for PT Re-Evaluation  12/20/18    Authorization Type  FOTO AT LEAST EVERY 5TH VISIT.  KX MODIFIER AFTER 15 VISITS.  PROGRESS NOTE AT 10TH VISIT.    PT Start Time  1015    PT Stop Time  1117    PT Time Calculation (min)  62 min    Activity Tolerance  Patient tolerated treatment well    Behavior During Therapy  WFL for tasks assessed/performed       Past Medical History:  Diagnosis Date  . ALLERGIC RHINITIS CAUSE UNSPECIFIED 05/27/2010  . Arthritis   . BACK PAIN, UPPER 02/21/2010  . Cervicalgia 05/27/2010  . CHEST PAIN UNSPECIFIED 04/25/2009  . DIVERTICULOSIS, COLON 03/09/2007  . DIZZINESS, CHRONIC 03/09/2007  . GERD 12/24/2006  . HEADACHE, SINUS 04/06/2009  . History of colon polyps   . HYPERLIPIDEMIA 12/24/2006   diet controlled  . HYPERTENSION 12/24/2006   better after weight loss  . INSOMNIA WITH SLEEP APNEA UNSPECIFIED 05/27/2010  . Motion sickness   . Neuropathy   . Open wound of wrist, complicated 1/74/0814  . OSTEOARTHRITIS 12/24/2006  . Other constipation 11/06/2009  . Pneumonia   . PONV (postoperative nausea and vomiting)   . PROSTATITIS, RECURRENT 12/30/2007  . SHOULDER PAIN, BILATERAL 07/16/2007  . Sleep apnea   . Squamous cell carcinoma, ear, left   . TRANSIENT ISCHEMIC ATTACKS, HX OF 04/06/2009    Past Surgical History:  Procedure Laterality Date  . ANKLE ARTHROSCOPY W/ OPEN REPAIR  2002   Repair  . COLONOSCOPY    . COLONOSCOPY     around 2017. [place]   . CYST EXCISION     from back 2015. likely this was meaning of ear cyst  . EAR CYST EXCISION Bilateral  01/18/2013   Procedure: EXCISION CYSTS FROM BACK (TWO);  Surgeon: Earnstine Regal, MD;  Location: [place];  Service: General;  Laterality: Bilateral;  . epidydmal cyst excision    . ESOPHAGOGASTRODUODENOSCOPY     [place] around 2017  . REPLACEMENT TOTAL KNEE BILATERAL  2005/2007  . SKIN CANCER EXCISION  2020   ear, squamous cell  . TONSILLECTOMY      There were no vitals filed for this visit.  Subjective Assessment - 12/14/18 1200    Pain Onset  More than a month ago                       [facility] Adult PT Treatment/Exercise - 12/14/18 0001      Exercises   Exercises  Shoulder      Shoulder Exercises: ROM/Strengthening   UBE (Upper Arm Bike)  8 minutes at 90 RPM's.      Modalities   Modalities  Electrical Stimulation;Moist Heat;Ultrasound      Moist Heat Therapy   Number Minutes Moist Heat  20 Minutes    Moist Heat Location  --   Left shoulder.     Acupuncturist Location  --   Left shoulder.   Electrical Stimulation Action  IFC  Electrical Stimulation Parameters  80-150 Hz x 20 minutes.    Electrical Stimulation Goals  Pain      Ultrasound   Ultrasound Location  --   Left anterior shoulder.   Ultrasound Parameters  Combo e'stim/U/S at 1.50 W/CM2 x 10 minutes.    Ultrasound Goals  Pain      Manual Therapy   Manual Therapy  Soft tissue mobilization    Soft tissue mobilization  IASTM to patient's left anterior shoulder x 5 minutes.               PT Short Term Goals - 09/13/18 1201      PT SHORT TERM GOAL #1   Title  STG's=LTG's.        PT Long Term Goals - 09/13/18 1201      PT LONG TERM GOAL #1   Title  Independent with a HEP.    Time  6    Period  Weeks    Status  New      PT LONG TERM GOAL #2   Title  Active left shoulder flexion to 150 degrees so the patient can easily reach overhead.    Time  6    Period  Weeks    Status  New      PT LONG TERM GOAL #3   Title   Increase left shoulder ER strength to a solid 4+/5 to increase stability for performance of functional activities.    Time  6    Period  Weeks    Status  New      PT LONG TERM GOAL #4   Title  Perform ADL's with left shoulder pain not > 2-3/10.    Time  6    Period  Weeks    Status  New            Plan - 12/14/18 1202    Clinical Impression Statement  Patient doing much better and is compliant to his HEP which is obvious as his left shoulder ER as improved.  His CC was tenderness over hisleft bicipital groove today.    PT Treatment/Interventions  ADLs/Self Care Home Management;Cryotherapy;Electrical Stimulation;Moist Heat;Ultrasound;Therapeutic exercise;Therapeutic activities;Patient/family education;Manual techniques;Passive range of motion;Dry needling;Vasopneumatic Device;Joint Manipulations    PT Next Visit Plan  Con't with left shoulder stretching and PRE's.       Patient will benefit from skilled therapeutic intervention in order to improve the following deficits and impairments:     Visit Diagnosis: 1. Acute pain of left shoulder   2. Muscle weakness (generalized)        Problem List Patient Active Problem List   Diagnosis Date Noted  . 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 Tremor 04/10/2014  . Elevated hemoglobin A1c 04/10/2014  . Solitary pulmonary nodule 04/10/2014  . Constipation, slow transit 08/14/2010  . Allergic rhinitis 05/27/2010  . Cervicalgia 05/27/2010  . Insomnia with sleep apnea 05/27/2010  . HEADACHE, SINUS 04/06/2009  . SHOULDER PAIN, BILATERAL 07/16/2007  . DIZZINESS, CHRONIC 03/09/2007  . Hyperlipidemia 12/24/2006  . Essential hypertension 12/24/2006  . GERD 12/24/2006  . Osteoarthritis 12/24/2006    Tarika Mckethan, [name] 12/14/2018, 12:04 PM  [facility] [address] [place], [place], [address] Phone: [phone]   Fax:  [phone]  Name: SEBASTIANO LUECKE MRN: [id number] Date of Birth:  23-Nov-1945

## 2018-12-16 ENCOUNTER — Ambulatory Visit: Payer: Medicare Other | Admitting: Physical Therapy

## 2018-12-16 ENCOUNTER — Other Ambulatory Visit: Payer: Self-pay

## 2018-12-16 DIAGNOSIS — M25512 Pain in left shoulder: Secondary | ICD-10-CM | POA: Diagnosis not present

## 2018-12-16 DIAGNOSIS — G8929 Other chronic pain: Secondary | ICD-10-CM | POA: Diagnosis not present

## 2018-12-16 DIAGNOSIS — M6281 Muscle weakness (generalized): Secondary | ICD-10-CM | POA: Diagnosis not present

## 2018-12-16 NOTE — Therapy (Addendum)
[facility] [place], [place], [address] Phone: [phone]   Fax:  [phone]  Physical Therapy Treatment  Patient Details  Name: [name] MRN: [id number] Date of Birth: [birth year] Referring Provider (PT): Zonia Kief MD   Encounter Date: 12/16/2018  PT End of Session - 12/16/18 1127    Visit Number  9    Number of Visits  12    Date for PT Re-Evaluation  12/20/18    Authorization Type  FOTO AT LEAST EVERY 5TH VISIT.  KX MODIFIER AFTER 15 VISITS.  PROGRESS NOTE AT 10TH VISIT.    PT Start Time  1005    PT Stop Time  1106    PT Time Calculation (min)  61 min    Activity Tolerance  Patient tolerated treatment well    Behavior During Therapy  WFL for tasks assessed/performed       Past Medical History:  Diagnosis Date  . ALLERGIC RHINITIS CAUSE UNSPECIFIED 05/27/2010  . Arthritis   . BACK PAIN, UPPER 02/21/2010  . Cervicalgia 05/27/2010  . CHEST PAIN UNSPECIFIED 04/25/2009  . DIVERTICULOSIS, COLON 03/09/2007  . DIZZINESS, CHRONIC 03/09/2007  . GERD 12/24/2006  . HEADACHE, SINUS 04/06/2009  . History of colon polyps   . HYPERLIPIDEMIA 12/24/2006   diet controlled  . HYPERTENSION 12/24/2006   better after weight loss  . INSOMNIA WITH SLEEP APNEA UNSPECIFIED 05/27/2010  . Motion sickness   . Neuropathy   . Open wound of wrist, complicated 9/98/3382  . OSTEOARTHRITIS 12/24/2006  . Other constipation 11/06/2009  . Pneumonia   . PONV (postoperative nausea and vomiting)   . PROSTATITIS, RECURRENT 12/30/2007  . SHOULDER PAIN, BILATERAL 07/16/2007  . Sleep apnea   . Squamous cell carcinoma, ear, left   . TRANSIENT ISCHEMIC ATTACKS, HX OF 04/06/2009    Past Surgical History:  Procedure Laterality Date  . ANKLE ARTHROSCOPY W/ OPEN REPAIR  2002   Repair  . COLONOSCOPY    . COLONOSCOPY     around 2017. [place]   . CYST EXCISION     from back 2015. likely this was meaning of ear cyst  . EAR CYST EXCISION Bilateral  01/18/2013   Procedure: EXCISION CYSTS FROM BACK (TWO);  Surgeon: Earnstine Regal, MD;  Location: [facility];  Service: General;  Laterality: Bilateral;  . epidydmal cyst excision    . ESOPHAGOGASTRODUODENOSCOPY     [place] around 2017  . REPLACEMENT TOTAL KNEE BILATERAL  2005/2007  . SKIN CANCER EXCISION  2020   ear, squamous cell  . TONSILLECTOMY      There were no vitals filed for this visit.                    [facility] Adult PT Treatment/Exercise - 12/16/18 0001      Exercises   Exercises  Shoulder      Shoulder Exercises: Sidelying   Other Sidelying Exercises  2# for [facility] ER to fatigue.      Shoulder Exercises: Pulleys   Other Pulley Exercises  Pulley system:  2 plates for IR/ER to fatigue, forward punches and scapular retraction with 3 plates to fatigue.        Shoulder Exercises: ROM/Strengthening   UBE (Upper Arm Bike)  10 minutes at 90 RPM's (5 minutes forward and 5 minutes backward).      Modalities   Modalities  Electrical Stimulation;Moist Heat      Moist Heat Therapy   Number Minutes  Moist Heat  20 Minutes    Moist Heat Location  --   Left shoulder.     Acupuncturist Location  --   Left shoulder.   Electrical Stimulation Action  IFC    Electrical Stimulation Parameters  80-150 Hz x 20 minutes.    Electrical Stimulation Goals  Pain      Manual Therapy   Manual Therapy  Passive ROM    Passive ROM  PROM to patient's left shoulder x 8 minutes into flexion, IR/ER and capsular stretches.               PT Short Term Goals - 09/13/18 1201      PT SHORT TERM GOAL #1   Title  STG's=LTG's.        PT Long Term Goals - 09/13/18 1201      PT LONG TERM GOAL #1   Title  Independent with a HEP.    Time  6    Period  Weeks    Status  New      PT LONG TERM GOAL #2   Title  Active left shoulder flexion to 150 degrees so the patient can easily reach overhead.    Time  6    Period  Weeks     Status  New      PT LONG TERM GOAL #3   Title  Increase left shoulder ER strength to a solid 4+/5 to increase stability for performance of functional activities.    Time  6    Period  Weeks    Status  New      PT LONG TERM GOAL #4   Title  Perform ADL's with left shoulder pain not > 2-3/10.    Time  6    Period  Weeks    Status  New            Plan - 12/16/18 1117    Clinical Impression Statement  Patient reporting improvement but continues to experience left shoulder pain with certain movements.    PT Treatment/Interventions  ADLs/Self Care Home Management;Cryotherapy;Electrical Stimulation;Moist Heat;Ultrasound;Therapeutic exercise;Therapeutic activities;Patient/family education;Manual techniques;Passive range of motion;Dry needling;Vasopneumatic Device;Joint Manipulations       Patient will benefit from skilled therapeutic intervention in order to improve the following deficits and impairments:     Visit Diagnosis: 1. Acute pain of left shoulder   2. Muscle weakness (generalized)        Problem List Patient Active Problem List   Diagnosis Date Noted  . 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 Tremor 04/10/2014  . Elevated hemoglobin A1c 04/10/2014  . Solitary pulmonary nodule 04/10/2014  . Constipation, slow transit 08/14/2010  . Allergic rhinitis 05/27/2010  . Cervicalgia 05/27/2010  . Insomnia with sleep apnea 05/27/2010  . HEADACHE, SINUS 04/06/2009  . SHOULDER PAIN, BILATERAL 07/16/2007  . DIZZINESS, CHRONIC 03/09/2007  . Hyperlipidemia 12/24/2006  . Essential hypertension 12/24/2006  . GERD 12/24/2006  . Osteoarthritis 12/24/2006    Sully Dyment, [name] 12/16/2018, 11:58 AM  [facility] [address] [place], [place], [address] Phone: [phone]   Fax:  [phone]  Name: HARRISON ZETINA MRN: [id number] Date of Birth: [birth year]

## 2018-12-17 ENCOUNTER — Telehealth (INDEPENDENT_AMBULATORY_CARE_PROVIDER_SITE_OTHER): Payer: Medicare Other | Admitting: Gastroenterology

## 2018-12-17 ENCOUNTER — Encounter: Payer: Self-pay | Admitting: Gastroenterology

## 2018-12-17 ENCOUNTER — Other Ambulatory Visit: Payer: Self-pay

## 2018-12-17 VITALS — Ht 68.0 in | Wt 205.0 lb

## 2018-12-17 DIAGNOSIS — K219 Gastro-esophageal reflux disease without esophagitis: Secondary | ICD-10-CM

## 2018-12-17 DIAGNOSIS — R110 Nausea: Secondary | ICD-10-CM

## 2018-12-17 DIAGNOSIS — R140 Abdominal distension (gaseous): Secondary | ICD-10-CM | POA: Diagnosis not present

## 2018-12-17 DIAGNOSIS — K449 Diaphragmatic hernia without obstruction or gangrene: Secondary | ICD-10-CM

## 2018-12-17 DIAGNOSIS — R1084 Generalized abdominal pain: Secondary | ICD-10-CM

## 2018-12-17 MED ORDER — PANTOPRAZOLE SODIUM 40 MG PO TBEC: 40.0000 mg | DELAYED_RELEASE_TABLET | Freq: Two times a day (BID) | ORAL | 6 refills | Status: DC

## 2018-12-17 NOTE — Progress Notes (Signed)
Chief Complaint: Bloating, Nausea, Abdominal pain, increased gas/flatus, GERD  Referring Provider:     Eliezer Bottom, NP  GI Hx: 72 year old male initially seen by me in 07/2018 for evaluation of abdominal bloating, generalized abdominal pain, increased gas/flatus/belching and nausea without emesis.  Recent EGD unrevealing for UGI etiology.  Was given trial of rifaximin and probiotics for possible SIBO secondary to multiple previous antibiotics closures.  Additionally, longstanding history of reflux, c/b hiatal hernia, characterized by heartburn and regurgitation.  Generally well controlled with pantoprazole daily, with occasional breakthrough symptoms.  EGD demonstrated 2 cm axial height/4 cm transverse width hernia with Hill grade 3 valve.  Barium esophagram demonstrated extensive tertiary contractions, small hiatal hernia, but no esophageal reflux noted.  Was referred for EM and pH/impedance, to be done off PPI therapy, which has been delayed due to COVID-19.  Endoscopic Hx: -EGD (08/2018, Dr. Bryan Lemma): 2 cm axial height/4 cm transverse width hiatal hernia, irregular Z line, Hill grade 3 valve, non-H. pylori gastritis, normal duodenum with normal biopsies - EGD (10/2009, Dr. Sharlett Iles): 3-4 cm HH, Candida Esophagitis, normal stomach, 52F Maloney dilation - Colonoscopy (10/2009, Dr. Sharlett Iles): diverticulosis, rectal HPs -EGD (06/2006, Dr. Sharlett Iles): 2 cm hiatal hernia otherwise normal -Colonoscopy (05/2003, Dr. Sharlett Iles): Diverticulosis otherwise normal.  Recommended repeat in 5 years - Colonoscopy (08/1998): TA -EGD (08/1998): Hiatal hernia, Schatzki's ring -EGD (08/1994, Dr. Sharlett Iles): Hiatal hernia, esophageal stricture, reflux changes - Colonoscopy (03/1989): TA  HPI:    Due to current restrictions/limitations of in-office visits due to the COVID-19 pandemic, this scheduled clinical appointment was converted to a telehealth virtual consultation using Doximity.   -The patient did consent to this virtual visit and is aware of possible charges through their insurance for this visit.  -Names of all parties present: [name] (patient), Gerrit Heck, DO, [facility] (physician) -Patient location: Home -Physician location: Office  Interactive audio and video telecommunications were attempted between this provider and patient (via Doximity), however failed, due to patient having technical difficulties OR patient did not have access to video capability. We continued and completed visit with audio only.   [name] is a 73 y.o. male presenting to the Gastroenterology Clinic for routine f/u. Was last seen by me in 07/2018 for evaluation of multiple GI sxs as above, along with evaluation for reflux and possible consideration for TIF.   EGD was n/f HH but no reflux esophagitis and UGI series demonstrated no refluxate.  He has since increased Protonix to 40 mg p.o. twice daily by GI at the [place], with improvement in reflux sxs.   Recently fell from ladder with left shoulder injury then fell down steps a few weeks later, again injuring left shoulder. Currently undergoing acupuncture and PT. Also with C spine degenerative disease, with radiculopathy.  Following with Orthopedics.  Given his recent injuries and MSK issues, he like to hold off on any hiatal hernia repair/antireflux surgery in favor of ongoing medical management.  His additional GI symptoms of abdominal discomfort, increased gas, nausea largely resolved and no longer endorsing these during appointment today.  Past medical history, past surgical history, social history, family history, medications, and allergies reviewed in the chart and with patient.    Past Medical History:  Diagnosis Date  . ALLERGIC RHINITIS CAUSE UNSPECIFIED 05/27/2010  . Arthritis   . BACK PAIN, UPPER 02/21/2010  . Cervicalgia 05/27/2010  . CHEST PAIN UNSPECIFIED 04/25/2009  . DIVERTICULOSIS, COLON 03/09/2007  .  DIZZINESS,  CHRONIC 03/09/2007  . GERD 12/24/2006  . HEADACHE, SINUS 04/06/2009  . History of colon polyps   . HYPERLIPIDEMIA 12/24/2006   diet controlled  . HYPERTENSION 12/24/2006   better after weight loss  . INSOMNIA WITH SLEEP APNEA UNSPECIFIED 05/27/2010  . Motion sickness   . Neuropathy   . Open wound of wrist, complicated 7/61/6073  . OSTEOARTHRITIS 12/24/2006  . Other constipation 11/06/2009  . Pneumonia   . PONV (postoperative nausea and vomiting)   . PROSTATITIS, RECURRENT 12/30/2007  . SHOULDER PAIN, BILATERAL 07/16/2007  . Sleep apnea   . Squamous cell carcinoma, ear, left   . TRANSIENT ISCHEMIC ATTACKS, HX OF 04/06/2009     Past Surgical History:  Procedure Laterality Date  . ANKLE ARTHROSCOPY W/ OPEN REPAIR  2002   Repair  . COLONOSCOPY    . COLONOSCOPY     around 2017. [place]   . CYST EXCISION     from back 2015. likely this was meaning of ear cyst  . EAR CYST EXCISION Bilateral 01/18/2013   Procedure: EXCISION CYSTS FROM BACK (TWO);  Surgeon: Earnstine Regal, MD;  Location: [facility];  Service: General;  Laterality: Bilateral;  . epidydmal cyst excision    . ESOPHAGOGASTRODUODENOSCOPY     [place] around 2017  . REPLACEMENT TOTAL KNEE BILATERAL  2005/2007  . SKIN CANCER EXCISION  2020   ear, squamous cell  . TONSILLECTOMY     Family History  Problem Relation Age of Onset  . Achalasia Father   . Depression Father   . Heart disease Father   . Heart disease Brother 55       heavy smoker-died of MI  . Kidney failure Mother 80  . Breast cancer Mother   . Diabetes Sister   . Colon cancer Neg Hx   . Esophageal cancer Neg Hx    Social History   Tobacco Use  . Smoking status: Never Smoker  . Smokeless tobacco: Never Used  . Tobacco comment: second hand smoke  Substance Use Topics  . Alcohol use: No    Comment: quit 45 yrs ago  . Drug use: No   Current Outpatient Medications  Medication Sig Dispense Refill  . amitriptyline (ELAVIL) 50 MG tablet Take  1 tablet (50 mg total) by mouth at bedtime. 90 tablet 1  . aspirin EC 81 MG EC tablet Take 1 tablet (81 mg total) by mouth daily.    . Cyanocobalamin (B-12 IJ) Inject as directed. Every other week for 2 months and then every month afterward    . Cyanocobalamin 1000 MCG SUBL Place 1 tablet (1,000 mcg total) under the tongue daily. 90 tablet 0  . fluticasone (FLONASE) 50 MCG/ACT nasal spray Place 2 sprays into both nostrils daily. 16 g 6  . meclizine (ANTIVERT) 12.5 MG tablet Take 1-2 tablets (12.5-25 mg total) by mouth every 6 (six) hours as needed for dizziness. 30 tablet 1  . pantoprazole (PROTONIX) 40 MG tablet TAKE 1 TABLET BY MOUTH EVERY DAY (Patient taking differently: Take 40 mg by mouth 2 (two) times daily. ) 90 tablet 1  . Probiotic Product ([place]) CAPS Take 1 capsule by mouth daily.     . rifaximin (XIFAXAN) 550 MG TABS tablet Take 1 tablet (550 mg total) by mouth 3 (three) times daily. For two weeks. 42 tablet 0  . sodium chloride (OCEAN) 0.65 % SOLN nasal spray Place 2 sprays into both nostrils at bedtime.  No current facility-administered medications for this visit.    Allergies  Allergen Reactions  . Codeine Other (See Comments)    REACTION: Nausea     Review of Systems: All systems reviewed and negative except where noted in HPI.     Physical Exam:    Complete physical exam not completed due to the nature of this telehealth communication.   Gen: Awake, alert, and oriented, and well communicative. Psych: Pleasant, cooperative, normal speech, thought processing seemingly intact   ASSESSMENT AND PLAN;   1) GERD 2) Hiatal hernia  -Reflux symptoms well controlled with Protonix 40 mg bid.  Plan to continue this for now, and can titrate to lowest effective dose in the future -Given his recent injuries and MSK issues, he like to hold off on any hiatal hernia repair/antireflux surgery in favor of ongoing medical management. -Placed refill for Protonix 40  mg p.o. twice daily -Since he is holding off on any antireflux surgeries, can hold off on performing EM and pH/impedance testing -Resume antireflux lifestyle measures -RTC in 6 months or sooner as needed  3) Bloating 4) Nausea 5) Abdominal pain  Symptoms essentially resolved after trial of rifaximin and probiotic.  EGD was otherwise unrevealing.  No further work-up needed at this time given symptoms have resolved.  I spent a total of 15 minutes of time with the patient. Greater than 50% of the time was spent counseling and coordinating care.     Lavena Bullion, DO, FACG  12/17/2018, 2:38 PM   Ethlyn Gallery, Steele Berg, MD

## 2018-12-17 NOTE — Patient Instructions (Signed)
If you are age 72 or older, your body mass index should be between 23-30. Your Body mass index is 31.17 kg/m. If this is out of the aforementioned range listed, please consider follow up with your Primary Care Provider.  If you are age 60 or younger, your body mass index should be between 19-25. Your Body mass index is 31.17 kg/m. If this is out of the aformentioned range listed, please consider follow up with your Primary Care Provider.   To help prevent the possible spread of infection to our patients, communities, and staff; we will be implementing the following measures:  As of now we are not allowing any visitors/family members to accompany you to any upcoming appointments with [facility]. If you have any concerns about this please contact our office to discuss prior to the appointment.   We have sent the following medications to your pharmacy for you to pick up at your convenience: Protonix 40mg  by mouth twice daily.  Please call our office at [phone] to set up your 3-6 month follow up visit.  It was a pleasure to see you today!  Vito Cirigliano, D.O.

## 2018-12-20 ENCOUNTER — Ambulatory Visit: Payer: Medicare Other | Admitting: Physical Therapy

## 2018-12-20 ENCOUNTER — Other Ambulatory Visit: Payer: Self-pay

## 2018-12-20 DIAGNOSIS — M25512 Pain in left shoulder: Secondary | ICD-10-CM | POA: Diagnosis not present

## 2018-12-20 DIAGNOSIS — M6281 Muscle weakness (generalized): Secondary | ICD-10-CM

## 2018-12-20 DIAGNOSIS — G8929 Other chronic pain: Secondary | ICD-10-CM | POA: Diagnosis not present

## 2018-12-20 NOTE — Therapy (Signed)
[facility] [place], [place], [address] Phone: [phone]   Fax:  [phone]  Physical Therapy Treatment  Patient Details  Name: [name] MRN: [id number] Date of Birth: Nov 17, 1945 Referring Provider (PT): Zonia Kief MD   Encounter Date: 12/20/2018  PT End of Session - 12/20/18 1102    Visit Number  10    Number of Visits  12    Date for PT Re-Evaluation  12/20/18    Authorization Type  FOTO AT LEAST EVERY 5TH VISIT.  KX MODIFIER AFTER 15 VISITS.  PROGRESS NOTE AT 10TH VISIT.    Authorization - Visit Number  [id number]       Past Medical History:  Diagnosis Date  . ALLERGIC RHINITIS CAUSE UNSPECIFIED 05/27/2010  . Arthritis   . BACK PAIN, UPPER 02/21/2010  . Cervicalgia 05/27/2010  . CHEST PAIN UNSPECIFIED 04/25/2009  . DIVERTICULOSIS, COLON 03/09/2007  . DIZZINESS, CHRONIC 03/09/2007  . GERD 12/24/2006  . HEADACHE, SINUS 04/06/2009  . History of colon polyps   . HYPERLIPIDEMIA 12/24/2006   diet controlled  . HYPERTENSION 12/24/2006   better after weight loss  . INSOMNIA WITH SLEEP APNEA UNSPECIFIED 05/27/2010  . Motion sickness   . Neuropathy   . Open wound of wrist, complicated 3/00/9233  . OSTEOARTHRITIS 12/24/2006  . Other constipation 11/06/2009  . Pneumonia   . PONV (postoperative nausea and vomiting)   . PROSTATITIS, RECURRENT 12/30/2007  . SHOULDER PAIN, BILATERAL 07/16/2007  . Sleep apnea   . Squamous cell carcinoma, ear, left   . TRANSIENT ISCHEMIC ATTACKS, HX OF 04/06/2009    Past Surgical History:  Procedure Laterality Date  . ANKLE ARTHROSCOPY W/ OPEN REPAIR  2002   Repair  . COLONOSCOPY    . COLONOSCOPY     around 2017. [place]   . CYST EXCISION     from back 2015. likely this was meaning of ear cyst  . EAR CYST EXCISION Bilateral 01/18/2013   Procedure: EXCISION CYSTS FROM BACK (TWO);  Surgeon: Earnstine Regal, MD;  Location: [facility];  Service: General;  Laterality: Bilateral;   . epidydmal cyst excision    . ESOPHAGOGASTRODUODENOSCOPY     [place] around 2017  . REPLACEMENT TOTAL KNEE BILATERAL  2005/2007  . SKIN CANCER EXCISION  2020   ear, squamous cell  . TONSILLECTOMY      There were no vitals filed for this visit.  Subjective Assessment - 12/20/18 1103    Subjective  COVID-19 screen performed prior to patient entering clinic.  Exercises helping.    Pertinent History  H/o TIA's, h/o spinal pain, OA and ankle surgery.    Diagnostic tests  X-ray.  May be getting an MRI soon.    Patient Stated Goals  Use left UE without pain.    Currently in Pain?  Yes    Pain Score  3     Pain Location  Shoulder    Pain Orientation  Left    Pain Descriptors / Indicators  Aching    Pain Type  Acute pain    Pain Onset  More than a month ago                       [facility] Adult PT Treatment/Exercise - 12/20/18 0001      Exercises   Exercises  Shoulder      Shoulder Exercises: Sidelying   Other Sidelying Exercises  2# for [facility] ER to fatigue x  2.      Shoulder Exercises: Pulleys   Other Pulley Exercises  20# IR/ER to fatigue (ER to fatigue x 2); forward punches and scapular retraction at 30# to fatigue.      Shoulder Exercises: ROM/Strengthening   UBE (Upper Arm Bike)  10 minutes at 90 RPM's (5 minutes forward and 5 minutes backward).      Modalities   Modalities  Electrical Stimulation;Ultrasound      Moist Heat Therapy   Number Minutes Moist Heat  20 Minutes    Moist Heat Location  --   Left shoulder.     Acupuncturist Location  --   Left anterior shoulder and acromial ridge area.   Electrical Stimulation Action  Pre-mod.    Electrical Stimulation Parameters  80-150 Hz x 20 minutes.    Electrical Stimulation Goals  Pain      Ultrasound   Ultrasound Location  --   left ant shld and acromial ridge region.   Ultrasound Parameters  Combo e'stim/U/S at 1.50 W/CM2 x 8 minutes.    Ultrasound Goals  Pain                PT Short Term Goals - 09/13/18 1201      PT SHORT TERM GOAL #1   Title  STG's=LTG's.        PT Long Term Goals - 09/13/18 1201      PT LONG TERM GOAL #1   Title  Independent with a HEP.    Time  6    Period  Weeks    Status  New      PT LONG TERM GOAL #2   Title  Active left shoulder flexion to 150 degrees so the patient can easily reach overhead.    Time  6    Period  Weeks    Status  New      PT LONG TERM GOAL #3   Title  Increase left shoulder ER strength to a solid 4+/5 to increase stability for performance of functional activities.    Time  6    Period  Weeks    Status  New      PT LONG TERM GOAL #4   Title  Perform ADL's with left shoulder pain not > 2-3/10.    Time  6    Period  Weeks    Status  New            Plan - 12/20/18 1120    Clinical Impression Statement  See "Therapy Note" section.    PT Treatment/Interventions  ADLs/Self Care Home Management;Cryotherapy;Electrical Stimulation;Moist Heat;Ultrasound;Therapeutic exercise;Therapeutic activities;Patient/family education;Manual techniques;Passive range of motion;Dry needling;Vasopneumatic Device;Joint Manipulations    PT Next Visit Plan  Con't with left shoulder stretching and PRE's.    Consulted and Agree with Plan of Care  Patient       Patient will benefit from skilled therapeutic intervention in order to improve the following deficits and impairments:     Visit Diagnosis: 1. Acute pain of left shoulder   2. Muscle weakness (generalized)        Problem List Patient Active Problem List   Diagnosis Date Noted  . 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 Tremor 04/10/2014  . Elevated hemoglobin A1c 04/10/2014  . Solitary pulmonary nodule 04/10/2014  . Constipation, slow transit 08/14/2010  . Allergic rhinitis 05/27/2010  . Cervicalgia 05/27/2010  . Insomnia with sleep apnea 05/27/2010  . HEADACHE, SINUS 04/06/2009  . SHOULDER PAIN, BILATERAL  07/16/2007  . DIZZINESS, CHRONIC  03/09/2007  . Hyperlipidemia 12/24/2006  . Essential hypertension 12/24/2006  . GERD 12/24/2006  . Osteoarthritis 12/24/2006   Progress Note Reporting Period 09/13/18 to 12/20/18.  See note below for Objective Data and Assessment of Progress/Goals. Patient stating his shoulder is beginning to feel better with a consistent decrease in pain.  He had an extended lay off due to [place] but is now progressing toward goals.     Beyounce Dickens, [name] 12/20/2018, 11:23 AM  [facility] [address] [place], [place], [address] Phone: [phone]   Fax:  [phone]  Name: [name] MRN: [id number] Date of Birth: 12/29/1945

## 2018-12-22 ENCOUNTER — Ambulatory Visit: Payer: Medicare Other | Admitting: Physical Therapy

## 2018-12-23 ENCOUNTER — Encounter: Payer: Non-veteran care | Admitting: Physical Therapy

## 2018-12-23 DIAGNOSIS — Z6831 Body mass index (BMI) 31.0-31.9, adult: Secondary | ICD-10-CM | POA: Diagnosis not present

## 2018-12-23 DIAGNOSIS — S46002D Unspecified injury of muscle(s) and tendon(s) of the rotator cuff of left shoulder, subsequent encounter: Secondary | ICD-10-CM | POA: Diagnosis not present

## 2018-12-23 DIAGNOSIS — M50122 Cervical disc disorder at C5-C6 level with radiculopathy: Secondary | ICD-10-CM | POA: Diagnosis not present

## 2018-12-24 ENCOUNTER — Other Ambulatory Visit: Payer: Self-pay | Admitting: Family Medicine

## 2018-12-27 ENCOUNTER — Ambulatory Visit: Payer: Medicare Other | Admitting: Physical Therapy

## 2018-12-27 ENCOUNTER — Encounter: Payer: Self-pay | Admitting: Physical Therapy

## 2018-12-27 ENCOUNTER — Other Ambulatory Visit: Payer: Self-pay

## 2018-12-27 DIAGNOSIS — M6281 Muscle weakness (generalized): Secondary | ICD-10-CM | POA: Diagnosis not present

## 2018-12-27 DIAGNOSIS — M25512 Pain in left shoulder: Secondary | ICD-10-CM

## 2018-12-27 DIAGNOSIS — G8929 Other chronic pain: Secondary | ICD-10-CM | POA: Diagnosis not present

## 2018-12-27 NOTE — Therapy (Signed)
[facility] [place], [place], [address] Phone: [phone]   Fax:  [phone]  Physical Therapy Treatment  Patient Details  Name: [name] MRN: [id number] Date of Birth: 10-20-1945 Referring Provider (PT): Zonia Kief MD   Encounter Date: 12/27/2018  PT End of Session - 12/27/18 1006    Visit Number  11    Number of Visits  12    Date for PT Re-Evaluation  12/20/18    Authorization Type  FOTO AT LEAST EVERY 5TH VISIT.  KX MODIFIER AFTER 15 VISITS.  PROGRESS NOTE AT 10TH VISIT.    PT Start Time  1014    PT Stop Time  1109    PT Time Calculation (min)  55 min    Activity Tolerance  Patient tolerated treatment well    Behavior During Therapy  WFL for tasks assessed/performed       Past Medical History:  Diagnosis Date  . ALLERGIC RHINITIS CAUSE UNSPECIFIED 05/27/2010  . Arthritis   . BACK PAIN, UPPER 02/21/2010  . Cervicalgia 05/27/2010  . CHEST PAIN UNSPECIFIED 04/25/2009  . DIVERTICULOSIS, COLON 03/09/2007  . DIZZINESS, CHRONIC 03/09/2007  . GERD 12/24/2006  . HEADACHE, SINUS 04/06/2009  . History of colon polyps   . HYPERLIPIDEMIA 12/24/2006   diet controlled  . HYPERTENSION 12/24/2006   better after weight loss  . INSOMNIA WITH SLEEP APNEA UNSPECIFIED 05/27/2010  . Motion sickness   . Neuropathy   . Open wound of wrist, complicated 2/95/2841  . OSTEOARTHRITIS 12/24/2006  . Other constipation 11/06/2009  . Pneumonia   . PONV (postoperative nausea and vomiting)   . PROSTATITIS, RECURRENT 12/30/2007  . SHOULDER PAIN, BILATERAL 07/16/2007  . Sleep apnea   . Squamous cell carcinoma, ear, left   . TRANSIENT ISCHEMIC ATTACKS, HX OF 04/06/2009    Past Surgical History:  Procedure Laterality Date  . ANKLE ARTHROSCOPY W/ OPEN REPAIR  2002   Repair  . COLONOSCOPY    . COLONOSCOPY     around 2017. [place]   . CYST EXCISION     from back 2015. likely this was meaning of ear cyst  . EAR CYST EXCISION Bilateral  01/18/2013   Procedure: EXCISION CYSTS FROM BACK (TWO);  Surgeon: Earnstine Regal, MD;  Location: [facility];  Service: General;  Laterality: Bilateral;  . epidydmal cyst excision    . ESOPHAGOGASTRODUODENOSCOPY     [place] around 2017  . REPLACEMENT TOTAL KNEE BILATERAL  2005/2007  . SKIN CANCER EXCISION  2020   ear, squamous cell  . TONSILLECTOMY      There were no vitals filed for this visit.  Subjective Assessment - 12/27/18 1014    Subjective  COVID 19 screening performed on patient upon arrival. Patient reports that he still has pain and problems out of his shoulder. Reports that he got his MRI results back which said tendonitis and torn cartilage in L shoulder.    Pertinent History  H/o TIA's, h/o spinal pain, OA and ankle surgery.    Diagnostic tests  X-ray.  May be getting an MRI soon.    Patient Stated Goals  Use left UE without pain.    Currently in Pain?  Yes    Pain Score  5     Pain Location  Shoulder    Pain Orientation  Left    Pain Descriptors / Indicators  Discomfort    Pain Type  Acute pain    Pain Onset  More  than a month ago         [facility] PT Assessment - 12/27/18 0001      Assessment   Medical Diagnosis  Injury of tendon of RTC of left shoulder.    Referring Provider (PT)  Zonia Kief MD      Precautions   Precautions  None      Restrictions   Weight Bearing Restrictions  No                   [facility] Adult PT Treatment/Exercise - 12/27/18 0001      Shoulder Exercises: Standing   Protraction  Strengthening;Left;Weights    Protraction Weight (lbs)  30    Protraction Limitations  3x10 reps    External Rotation  Strengthening;Left;Weights    External Rotation Weight (lbs)  20    External Rotation Limitations  3x10 reps    Internal Rotation  Strengthening;Left;Weights    Internal Rotation Weight (lbs)  20    Internal Rotation Limitations  3x10 reps    Extension  Strengthening;Left;Weights    Extension Weight (lbs)  30     Extension Limitations  3x10 reps    Row  Strengthening;Left;Weights    Row Weight (lbs)  30    Row Limitations  3x10 reps      Shoulder Exercises: ROM/Strengthening   UBE (Upper Arm Bike)  120 RPM x8 min      Modalities   Modalities  Electrical Stimulation;Ultrasound;Moist Heat      Moist Heat Therapy   Number Minutes Moist Heat  15 Minutes    Moist Heat Location  Shoulder      Electrical Stimulation   Electrical Stimulation Location  Left shoulder.    Electrical Stimulation Action  Pre-Mod    Electrical Stimulation Parameters  80-150 hz x15 min    Electrical Stimulation Goals  Pain      Ultrasound   Ultrasound Location  L shoulder    Ultrasound Parameters  Combo 1.5 w/cm2, 100%, 1 mhz x10 min    Ultrasound Goals  Pain               PT Short Term Goals - 09/13/18 1201      PT SHORT TERM GOAL #1   Title  STG's=LTG's.        PT Long Term Goals - 09/13/18 1201      PT LONG TERM GOAL #1   Title  Independent with a HEP.    Time  6    Period  Weeks    Status  New      PT LONG TERM GOAL #2   Title  Active left shoulder flexion to 150 degrees so the patient can easily reach overhead.    Time  6    Period  Weeks    Status  New      PT LONG TERM GOAL #3   Title  Increase left shoulder ER strength to a solid 4+/5 to increase stability for performance of functional activities.    Time  6    Period  Weeks    Status  New      PT LONG TERM GOAL #4   Title  Perform ADL's with left shoulder pain not > 2-3/10.    Time  6    Period  Weeks    Status  New            Plan - 12/27/18 1141    Clinical Impression Statement  Patient presented in  clinic with reports of continued pain and disfunction of L shoulder. Patient progressed through strengthening of L shoulder. Patient fatigued fairly quickly with shoulder protraction, IR and ER especially with resistance. Normal modalities response noted following removal of the modalities. All goals remain on-going at  this time secondary to limitations.    Stability/Clinical Decision Making  Evolving/Moderate complexity    Rehab Potential  Excellent    PT Frequency  2x / week    PT Duration  6 weeks    PT Treatment/Interventions  ADLs/Self Care Home Management;Cryotherapy;Electrical Stimulation;Moist Heat;Ultrasound;Therapeutic exercise;Therapeutic activities;Patient/family education;Manual techniques;Passive range of motion;Dry needling;Vasopneumatic Device;Joint Manipulations    PT Next Visit Plan  Con't with left shoulder stretching and PRE's.    Consulted and Agree with Plan of Care  Patient       Patient will benefit from skilled therapeutic intervention in order to improve the following deficits and impairments:  Pain, Decreased activity tolerance, Decreased range of motion, Decreased strength  Visit Diagnosis: 1. Acute pain of left shoulder   2. Muscle weakness (generalized)        Problem List Patient Active Problem List   Diagnosis Date Noted  . 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 Tremor 04/10/2014  . Elevated hemoglobin A1c 04/10/2014  . Solitary pulmonary nodule 04/10/2014  . Constipation, slow transit 08/14/2010  . Allergic rhinitis 05/27/2010  . Cervicalgia 05/27/2010  . Insomnia with sleep apnea 05/27/2010  . HEADACHE, SINUS 04/06/2009  . SHOULDER PAIN, BILATERAL 07/16/2007  . DIZZINESS, CHRONIC 03/09/2007  . Hyperlipidemia 12/24/2006  . Essential hypertension 12/24/2006  . GERD 12/24/2006  . Osteoarthritis 12/24/2006    Standley Brooking, PTA 12/27/2018, 12:23 PM  [facility] [address] [place], [place], [address] Phone: [phone]   Fax:  [phone]  Name: KAVAN DEVAN MRN: [id number] Date of Birth: [birth year]

## 2018-12-30 ENCOUNTER — Encounter: Payer: Self-pay | Admitting: Physical Therapy

## 2018-12-30 ENCOUNTER — Other Ambulatory Visit: Payer: Self-pay

## 2018-12-30 ENCOUNTER — Ambulatory Visit: Payer: Medicare Other | Attending: Rehabilitation | Admitting: Physical Therapy

## 2018-12-30 DIAGNOSIS — M25512 Pain in left shoulder: Secondary | ICD-10-CM | POA: Insufficient documentation

## 2018-12-30 DIAGNOSIS — M6281 Muscle weakness (generalized): Secondary | ICD-10-CM | POA: Diagnosis not present

## 2018-12-30 NOTE — Therapy (Signed)
[facility] [place], [place], [address] Phone: [phone]   Fax:  [phone]  Physical Therapy Treatment  Patient Details  Name: [name] MRN: [id number] Date of Birth: 01-28-1946 Referring Provider (PT): Zonia Kief MD   Encounter Date: 12/30/2018  PT End of Session - 12/30/18 1159    Visit Number  12    Number of Visits  12    Date for PT Re-Evaluation  12/20/18    Authorization Type  FOTO AT LEAST EVERY 5TH VISIT.  KX MODIFIER AFTER 15 VISITS.  PROGRESS NOTE AT 10TH VISIT.    PT Start Time  1015    PT Stop Time  1120    PT Time Calculation (min)  65 min       Past Medical History:  Diagnosis Date  . ALLERGIC RHINITIS CAUSE UNSPECIFIED 05/27/2010  . Arthritis   . BACK PAIN, UPPER 02/21/2010  . Cervicalgia 05/27/2010  . CHEST PAIN UNSPECIFIED 04/25/2009  . DIVERTICULOSIS, COLON 03/09/2007  . DIZZINESS, CHRONIC 03/09/2007  . GERD 12/24/2006  . HEADACHE, SINUS 04/06/2009  . History of colon polyps   . HYPERLIPIDEMIA 12/24/2006   diet controlled  . HYPERTENSION 12/24/2006   better after weight loss  . INSOMNIA WITH SLEEP APNEA UNSPECIFIED 05/27/2010  . Motion sickness   . Neuropathy   . Open wound of wrist, complicated 7/61/6073  . OSTEOARTHRITIS 12/24/2006  . Other constipation 11/06/2009  . Pneumonia   . PONV (postoperative nausea and vomiting)   . PROSTATITIS, RECURRENT 12/30/2007  . SHOULDER PAIN, BILATERAL 07/16/2007  . Sleep apnea   . Squamous cell carcinoma, ear, left   . TRANSIENT ISCHEMIC ATTACKS, HX OF 04/06/2009    Past Surgical History:  Procedure Laterality Date  . ANKLE ARTHROSCOPY W/ OPEN REPAIR  2002   Repair  . COLONOSCOPY    . COLONOSCOPY     around 2017. [place]   . CYST EXCISION     from back 2015. likely this was meaning of ear cyst  . EAR CYST EXCISION Bilateral 01/18/2013   Procedure: EXCISION CYSTS FROM BACK (TWO);  Surgeon: Earnstine Regal, MD;  Location: [facility];   Service: General;  Laterality: Bilateral;  . epidydmal cyst excision    . ESOPHAGOGASTRODUODENOSCOPY     [place] around 2017  . REPLACEMENT TOTAL KNEE BILATERAL  2005/2007  . SKIN CANCER EXCISION  2020   ear, squamous cell  . TONSILLECTOMY      There were no vitals filed for this visit.  Subjective Assessment - 12/30/18 1107    Subjective  COVID-19 screen performed prior to patient entering clinic.  I can sleep on my left side better now.    Pertinent History  H/o TIA's, h/o spinal pain, OA and ankle surgery.                       [facility] Adult PT Treatment/Exercise - 12/30/18 0001      Exercises   Exercises  Shoulder      Shoulder Exercises: ROM/Strengthening   UBE (Upper Arm Bike)  10 minutes.      Modalities   Modalities  Electrical Stimulation;Moist Heat      Moist Heat Therapy   Number Minutes Moist Heat  20 Minutes    Moist Heat Location  --   Left shoulder.     Acupuncturist Location  Left shoulder.    Electrical Stimulation Action  Pre-mod.  Electrical Stimulation Parameters  80-150 Hz x 20 minutes.    Electrical Stimulation Goals  Pain      Ultrasound   Ultrasound Location  Left bicipital groove.    Ultrasound Parameters  Combo e'stim/U/S at 1.50 W/CM2 x 13 minutes.    Ultrasound Goals  Pain               PT Short Term Goals - 09/13/18 1201      PT SHORT TERM GOAL #1   Title  STG's=LTG's.        PT Long Term Goals - 09/13/18 1201      PT LONG TERM GOAL #1   Title  Independent with a HEP.    Time  6    Period  Weeks    Status  New      PT LONG TERM GOAL #2   Title  Active left shoulder flexion to 150 degrees so the patient can easily reach overhead.    Time  6    Period  Weeks    Status  New      PT LONG TERM GOAL #3   Title  Increase left shoulder ER strength to a solid 4+/5 to increase stability for performance of functional activities.    Time  6    Period  Weeks    Status   New      PT LONG TERM GOAL #4   Title  Perform ADL's with left shoulder pain not > 2-3/10.    Time  6    Period  Weeks    Status  New            Plan - 12/30/18 1112    Clinical Impression Statement  Patient progressing toward goals he states he is sleeping better on his left shoulder.  He states that the Accuncturist is going to do his shoulder.    PT Treatment/Interventions  ADLs/Self Care Home Management;Cryotherapy;Electrical Stimulation;Moist Heat;Ultrasound;Therapeutic exercise;Therapeutic activities;Patient/family education;Manual techniques;Passive range of motion;Dry needling;Vasopneumatic Device;Joint Manipulations       Patient will benefit from skilled therapeutic intervention in order to improve the following deficits and impairments:     Visit Diagnosis: 1. Acute pain of left shoulder   2. Muscle weakness (generalized)        Problem List Patient Active Problem List   Diagnosis Date Noted  . 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 Tremor 04/10/2014  . Elevated hemoglobin A1c 04/10/2014  . Solitary pulmonary nodule 04/10/2014  . Constipation, slow transit 08/14/2010  . Allergic rhinitis 05/27/2010  . Cervicalgia 05/27/2010  . Insomnia with sleep apnea 05/27/2010  . HEADACHE, SINUS 04/06/2009  . SHOULDER PAIN, BILATERAL 07/16/2007  . DIZZINESS, CHRONIC 03/09/2007  . Hyperlipidemia 12/24/2006  . Essential hypertension 12/24/2006  . GERD 12/24/2006  . Osteoarthritis 12/24/2006    Denson Niccoli, [name] 12/30/2018, 12:00 PM  [facility] [place], [place], [address] Phone: [phone]   Fax:  [phone]  Name: ANDRAS GRUNEWALD MRN: [id number] Date of Birth: 1946-01-16

## 2019-01-03 ENCOUNTER — Other Ambulatory Visit: Payer: Self-pay

## 2019-01-03 ENCOUNTER — Ambulatory Visit: Payer: Medicare Other | Admitting: Physical Therapy

## 2019-01-03 DIAGNOSIS — M6281 Muscle weakness (generalized): Secondary | ICD-10-CM | POA: Diagnosis not present

## 2019-01-03 DIAGNOSIS — M25512 Pain in left shoulder: Secondary | ICD-10-CM | POA: Diagnosis not present

## 2019-01-03 NOTE — Therapy (Signed)
[facility] [place], [place], [address] Phone: [phone]   Fax:  [phone]  Physical Therapy Evaluation  Patient Details  Name: [name] MRN: [id number] Date of Birth: 07-Jun-1946 Referring Provider (PT): Zonia Kief MD   Encounter Date: 01/03/2019  PT End of Session - 01/03/19 1249    Visit Number  13    Number of Visits  18    Date for PT Re-Evaluation  01/24/19    Authorization Type  FOTO AT LEAST EVERY 5TH VISIT.  KX MODIFIER AFTER 15 VISITS.  PROGRESS NOTE AT 10TH VISIT.    Authorization - Visit Number  --    PT Start Time  1030    PT Stop Time  1124    PT Time Calculation (min)  54 min    Activity Tolerance  Patient tolerated treatment well    Behavior During Therapy  WFL for tasks assessed/performed       Past Medical History:  Diagnosis Date  . ALLERGIC RHINITIS CAUSE UNSPECIFIED 05/27/2010  . Arthritis   . BACK PAIN, UPPER 02/21/2010  . Cervicalgia 05/27/2010  . CHEST PAIN UNSPECIFIED 04/25/2009  . DIVERTICULOSIS, COLON 03/09/2007  . DIZZINESS, CHRONIC 03/09/2007  . GERD 12/24/2006  . HEADACHE, SINUS 04/06/2009  . History of colon polyps   . HYPERLIPIDEMIA 12/24/2006   diet controlled  . HYPERTENSION 12/24/2006   better after weight loss  . INSOMNIA WITH SLEEP APNEA UNSPECIFIED 05/27/2010  . Motion sickness   . Neuropathy   . Open wound of wrist, complicated 8/75/6433  . OSTEOARTHRITIS 12/24/2006  . Other constipation 11/06/2009  . Pneumonia   . PONV (postoperative nausea and vomiting)   . PROSTATITIS, RECURRENT 12/30/2007  . SHOULDER PAIN, BILATERAL 07/16/2007  . Sleep apnea   . Squamous cell carcinoma, ear, left   . TRANSIENT ISCHEMIC ATTACKS, HX OF 04/06/2009    Past Surgical History:  Procedure Laterality Date  . ANKLE ARTHROSCOPY W/ OPEN REPAIR  2002   Repair  . COLONOSCOPY    . COLONOSCOPY     around 2017. [place]   . CYST EXCISION     from back 2015. likely this was meaning of ear  cyst  . EAR CYST EXCISION Bilateral 01/18/2013   Procedure: EXCISION CYSTS FROM BACK (TWO);  Surgeon: Earnstine Regal, MD;  Location: [facility];  Service: General;  Laterality: Bilateral;  . epidydmal cyst excision    . ESOPHAGOGASTRODUODENOSCOPY     [place] around 2017  . REPLACEMENT TOTAL KNEE BILATERAL  2005/2007  . SKIN CANCER EXCISION  2020   ear, squamous cell  . TONSILLECTOMY      There were no vitals filed for this visit.   Subjective Assessment - 01/03/19 1249    Subjective  COVID-19 screen performed prior to patient entering clinic.  I'm about 50% better.                    Objective measurements completed on examination: See above findings.      [facility] Adult PT Treatment/Exercise - 01/03/19 0001      Shoulder Exercises: Pulleys   Other Pulley Exercises  20# IR/ER and 40# punches and extensions 2 sets to fatigue.      Shoulder Exercises: ROM/Strengthening   UBE (Upper Arm Bike)  8 minutes.      Modalities   Modalities  Electrical Stimulation;Ultrasound      Moist Heat Therapy   Number Minutes Moist Heat  20 Minutes  Moist Heat Location  --   Left shoulder.     Acupuncturist Location  --   Left shoulder.   Electrical Stimulation Action  IFC    Electrical Stimulation Parameters  80-150 Hz x 20 minutes.    Electrical Stimulation Goals  Pain      Ultrasound   Ultrasound Location  --   Left shoulder.   Ultrasound Parameters  Combo e'stim/U/S at 1.50 W/CM2 x 7 minutes.    Ultrasound Goals  Pain               PT Short Term Goals - 09/13/18 1201      PT SHORT TERM GOAL #1   Title  STG's=LTG's.        PT Long Term Goals - 09/13/18 1201      PT LONG TERM GOAL #1   Title  Independent with a HEP.    Time  6    Period  Weeks    Status  New      PT LONG TERM GOAL #2   Title  Active left shoulder flexion to 150 degrees so the patient can easily reach overhead.    Time  6     Period  Weeks    Status  New      PT LONG TERM GOAL #3   Title  Increase left shoulder ER strength to a solid 4+/5 to increase stability for performance of functional activities.    Time  6    Period  Weeks    Status  New      PT LONG TERM GOAL #4   Title  Perform ADL's with left shoulder pain not > 2-3/10.    Time  6    Period  Weeks    Status  New             Plan - 01/03/19 1254    Clinical Impression Statement  Patient doing well and now reporting a 50% improvement rating.       Patient will benefit from skilled therapeutic intervention in order to improve the following deficits and impairments:     Visit Diagnosis: 1. Acute pain of left shoulder   2. Muscle weakness (generalized)        Problem List Patient Active Problem List   Diagnosis Date Noted  . 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 Tremor 04/10/2014  . Elevated hemoglobin A1c 04/10/2014  . Solitary pulmonary nodule 04/10/2014  . Constipation, slow transit 08/14/2010  . Allergic rhinitis 05/27/2010  . Cervicalgia 05/27/2010  . Insomnia with sleep apnea 05/27/2010  . HEADACHE, SINUS 04/06/2009  . SHOULDER PAIN, BILATERAL 07/16/2007  . DIZZINESS, CHRONIC 03/09/2007  . Hyperlipidemia 12/24/2006  . Essential hypertension 12/24/2006  . GERD 12/24/2006  . Osteoarthritis 12/24/2006    Rozetta Stumpp, [name] 01/03/2019, 12:58 PM  [facility] [address] [place], [place], [address] Phone: [phone]   Fax:  [phone]  Name: [name] MRN: [id number] Date of Birth: 12-07-1945

## 2019-01-07 ENCOUNTER — Ambulatory Visit: Payer: Medicare Other | Admitting: Physical Therapy

## 2019-01-07 ENCOUNTER — Other Ambulatory Visit: Payer: Self-pay

## 2019-01-07 ENCOUNTER — Encounter: Payer: Self-pay | Admitting: Physical Therapy

## 2019-01-07 DIAGNOSIS — M6281 Muscle weakness (generalized): Secondary | ICD-10-CM | POA: Diagnosis not present

## 2019-01-07 DIAGNOSIS — M25512 Pain in left shoulder: Secondary | ICD-10-CM | POA: Diagnosis not present

## 2019-01-07 NOTE — Therapy (Signed)
[facility] [place], [place], [address] Phone: [phone]   Fax:  [phone]  Physical Therapy Treatment  Patient Details  Name: [name] MRN: [id number] Date of Birth: 22-Oct-1945 Referring Provider (PT): Zonia Kief MD   Encounter Date: 01/07/2019  PT End of Session - 01/07/19 1035    Visit Number  14    Number of Visits  18    Date for PT Re-Evaluation  01/24/19    Authorization Type  FOTO AT LEAST EVERY 5TH VISIT.  KX MODIFIER AFTER 15 VISITS.  PROGRESS NOTE AT 10TH VISIT.    PT Start Time  1030    PT Stop Time  1123    PT Time Calculation (min)  53 min    Activity Tolerance  Patient tolerated treatment well;Patient limited by pain    Behavior During Therapy  [facility] for tasks assessed/performed       Past Medical History:  Diagnosis Date  . ALLERGIC RHINITIS CAUSE UNSPECIFIED 05/27/2010  . Arthritis   . BACK PAIN, UPPER 02/21/2010  . Cervicalgia 05/27/2010  . CHEST PAIN UNSPECIFIED 04/25/2009  . DIVERTICULOSIS, COLON 03/09/2007  . DIZZINESS, CHRONIC 03/09/2007  . GERD 12/24/2006  . HEADACHE, SINUS 04/06/2009  . History of colon polyps   . HYPERLIPIDEMIA 12/24/2006   diet controlled  . HYPERTENSION 12/24/2006   better after weight loss  . INSOMNIA WITH SLEEP APNEA UNSPECIFIED 05/27/2010  . Motion sickness   . Neuropathy   . Open wound of wrist, complicated 0/25/4270  . OSTEOARTHRITIS 12/24/2006  . Other constipation 11/06/2009  . Pneumonia   . PONV (postoperative nausea and vomiting)   . PROSTATITIS, RECURRENT 12/30/2007  . SHOULDER PAIN, BILATERAL 07/16/2007  . Sleep apnea   . Squamous cell carcinoma, ear, left   . TRANSIENT ISCHEMIC ATTACKS, HX OF 04/06/2009    Past Surgical History:  Procedure Laterality Date  . ANKLE ARTHROSCOPY W/ OPEN REPAIR  2002   Repair  . COLONOSCOPY    . COLONOSCOPY     around 2017. [place]   . CYST EXCISION     from back 2015. likely this was meaning of ear cyst  . EAR  CYST EXCISION Bilateral 01/18/2013   Procedure: EXCISION CYSTS FROM BACK (TWO);  Surgeon: Earnstine Regal, MD;  Location: [facility];  Service: General;  Laterality: Bilateral;  . epidydmal cyst excision    . ESOPHAGOGASTRODUODENOSCOPY     [place] around 2017  . REPLACEMENT TOTAL KNEE BILATERAL  2005/2007  . SKIN CANCER EXCISION  2020   ear, squamous cell  . TONSILLECTOMY      There were no vitals filed for this visit.  Subjective Assessment - 01/07/19 1036    Subjective  COVID-19 screen performed prior to patient entering clinic. 4/10 pain today.    Patient Stated Goals  Use left UE without pain.    Currently in Pain?  Yes    Pain Score  4     Pain Location  Shoulder    Pain Orientation  Left    Pain Descriptors / Indicators  Discomfort    Pain Type  Acute pain                       [facility] Adult PT Treatment/Exercise - 01/07/19 0001      Shoulder Exercises: Supine   Horizontal ABduction  Strengthening;Left;20 reps;Weights    Horizontal ABduction Weight (lbs)  2      Shoulder Exercises:  Sidelying   External Rotation  Strengthening;Left;20 reps;Weights    External Rotation Weight (lbs)  1      Shoulder Exercises: Standing   Protraction  Strengthening;Left;Weights    Protraction Weight (lbs)  30    Protraction Limitations  3x10 reps    Horizontal ABduction  Strengthening;Both;20 reps;Theraband    Theraband Level (Shoulder Horizontal ABduction)  Level 3 (Green)    External Rotation  Strengthening;Left;15 reps;Weights;Theraband    External Rotation Limitations  painful so switched to band due to pain    Internal Rotation  Strengthening;Left;10 reps;20 reps;Theraband    Theraband Level (Shoulder Internal Rotation)  Level 3 (Green)    Extension  Strengthening;Left;10 reps;20 reps;Theraband    Theraband Level (Shoulder Extension)  Level 3 (Green)    Extension Limitations  cues to stop rotating trunk    Row  Strengthening;Left;10 reps;20  reps;Theraband    Theraband Level (Shoulder Row)  Level 3 (Green)    Row Limitations  cues scapula to spiine    Other Standing Exercises  lawn mowre 5# x 20; flex/scap/abd 2# x 10 ea      Shoulder Exercises: ROM/Strengthening   UBE (Upper Arm Bike)  8 minutes.      Modalities   Modalities  Electrical Stimulation;Moist Heat      Moist Heat Therapy   Number Minutes Moist Heat  15 Minutes    Moist Heat Location  Shoulder      Electrical Stimulation   Electrical Stimulation Location  L shoulder IFC 80-150 Hz x 15 min    Electrical Stimulation Goals  Pain               PT Short Term Goals - 09/13/18 1201      PT SHORT TERM GOAL #1   Title  STG's=LTG's.        PT Long Term Goals - 09/13/18 1201      PT LONG TERM GOAL #1   Title  Independent with a HEP.    Time  6    Period  Weeks    Status  New      PT LONG TERM GOAL #2   Title  Active left shoulder flexion to 150 degrees so the patient can easily reach overhead.    Time  6    Period  Weeks    Status  New      PT LONG TERM GOAL #3   Title  Increase left shoulder ER strength to a solid 4+/5 to increase stability for performance of functional activities.    Time  6    Period  Weeks    Status  New      PT LONG TERM GOAL #4   Title  Perform ADL's with left shoulder pain not > 2-3/10.    Time  6    Period  Weeks    Status  New            Plan - 01/07/19 1210    Clinical Impression Statement  Patient tolerated treatment fairly well but had increased pain with weighted [place] so decreased to tband. Patient should be watched closely for correct form and to avoid overdoing it.    Personal Factors and Comorbidities  Past/Current Experience    PT Treatment/Interventions  ADLs/Self Care Home Management;Cryotherapy;Electrical Stimulation;Moist Heat;Ultrasound;Therapeutic exercise;Therapeutic activities;Patient/family education;Manual techniques;Passive range of motion;Dry needling;Vasopneumatic Device;Joint  Manipulations    PT Next Visit Plan  Con't with left shoulder stretching and PRE's.       Patient will  benefit from skilled therapeutic intervention in order to improve the following deficits and impairments:  Pain, Decreased activity tolerance, Decreased range of motion, Decreased strength  Visit Diagnosis: 1. Acute pain of left shoulder   2. Muscle weakness (generalized)        Problem List Patient Active Problem List   Diagnosis Date Noted  . 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 Tremor 04/10/2014  . Elevated hemoglobin A1c 04/10/2014  . Solitary pulmonary nodule 04/10/2014  . Constipation, slow transit 08/14/2010  . Allergic rhinitis 05/27/2010  . Cervicalgia 05/27/2010  . Insomnia with sleep apnea 05/27/2010  . HEADACHE, SINUS 04/06/2009  . SHOULDER PAIN, BILATERAL 07/16/2007  . DIZZINESS, CHRONIC 03/09/2007  . Hyperlipidemia 12/24/2006  . Essential hypertension 12/24/2006  . GERD 12/24/2006  . Osteoarthritis 12/24/2006   Madelyn Flavors PT 01/07/2019, 12:19 PM  [facility] [address] [place], [place], [address] Phone: [phone]   Fax:  [phone]  Name: MAYLON SAILORS MRN: [id number] Date of Birth: April 19, 1946

## 2019-01-10 ENCOUNTER — Other Ambulatory Visit: Payer: Self-pay

## 2019-01-10 ENCOUNTER — Ambulatory Visit: Payer: Medicare Other | Admitting: Physical Therapy

## 2019-01-10 DIAGNOSIS — M25512 Pain in left shoulder: Secondary | ICD-10-CM

## 2019-01-10 DIAGNOSIS — M6281 Muscle weakness (generalized): Secondary | ICD-10-CM | POA: Diagnosis not present

## 2019-01-10 NOTE — Therapy (Signed)
[facility] [place], [place], [address] Phone: [phone]   Fax:  [phone]  Physical Therapy Treatment  Patient Details  Name: [name] MRN: [id number] Date of Birth: 05/10/1946 Referring Provider (PT): Zonia Kief MD   Encounter Date: 01/10/2019  PT End of Session - 01/10/19 1124    Visit Number  15    Number of Visits  18    Authorization Type  FOTO AT LEAST EVERY 5TH VISIT.  KX MODIFIER AFTER 15 VISITS.  PROGRESS NOTE AT 10TH VISIT.    Authorization - Visit Number  115    [id number] - Number of Visits  1043    Activity Tolerance  Patient tolerated treatment well;Patient limited by pain    Behavior During Therapy  [facility] for tasks assessed/performed       Past Medical History:  Diagnosis Date  . ALLERGIC RHINITIS CAUSE UNSPECIFIED 05/27/2010  . Arthritis   . BACK PAIN, UPPER 02/21/2010  . Cervicalgia 05/27/2010  . CHEST PAIN UNSPECIFIED 04/25/2009  . DIVERTICULOSIS, COLON 03/09/2007  . DIZZINESS, CHRONIC 03/09/2007  . GERD 12/24/2006  . HEADACHE, SINUS 04/06/2009  . History of colon polyps   . HYPERLIPIDEMIA 12/24/2006   diet controlled  . HYPERTENSION 12/24/2006   better after weight loss  . INSOMNIA WITH SLEEP APNEA UNSPECIFIED 05/27/2010  . Motion sickness   . Neuropathy   . Open wound of wrist, complicated 4/49/7530  . OSTEOARTHRITIS 12/24/2006  . Other constipation 11/06/2009  . Pneumonia   . PONV (postoperative nausea and vomiting)   . PROSTATITIS, RECURRENT 12/30/2007  . SHOULDER PAIN, BILATERAL 07/16/2007  . Sleep apnea   . Squamous cell carcinoma, ear, left   . TRANSIENT ISCHEMIC ATTACKS, HX OF 04/06/2009    Past Surgical History:  Procedure Laterality Date  . ANKLE ARTHROSCOPY W/ OPEN REPAIR  2002   Repair  . COLONOSCOPY    . COLONOSCOPY     around 2017. [place]   . CYST EXCISION     from back 2015. likely this was meaning of ear cyst  . EAR CYST EXCISION Bilateral 01/18/2013   Procedure: EXCISION CYSTS FROM BACK (TWO);  Surgeon: Earnstine Regal, MD;  Location: [facility];  Service: General;  Laterality: Bilateral;  . epidydmal cyst excision    . ESOPHAGOGASTRODUODENOSCOPY     [place] around 2017  . REPLACEMENT TOTAL KNEE BILATERAL  2005/2007  . SKIN CANCER EXCISION  2020   ear, squamous cell  . TONSILLECTOMY      There were no vitals filed for this visit.  Subjective Assessment - 01/10/19 1107    Subjective  COVID-19 screen performed prior to patient entering clinic.  Doing well.    Pertinent History  H/o TIA's, h/o spinal pain, OA and ankle surgery.    Currently in Pain?  Yes    Pain Score  4     Pain Orientation  Left    Pain Descriptors / Indicators  Discomfort    Pain Onset  More than a month ago                       [facility] Adult PT Treatment/Exercise - 01/10/19 0001      Shoulder Exercises: Pulleys   Other Pulley Exercises  20# into IR, ER, punches and extension all performed to fatigue x 10 minutes total.      Shoulder Exercises: ROM/Strengthening   UBE (Upper Arm Bike)  10 minutes at 30 RPM's.  Modalities   Modalities  Electrical Stimulation;Moist Heat      Moist Heat Therapy   Number Minutes Moist Heat  20 Minutes    Moist Heat Location  --   Left shoulder.     Acupuncturist Location  left shoulder.    Electrical Stimulation Action  IFC    Electrical Stimulation Parameters  80-150 Hz x 20 minutes.    Electrical Stimulation Goals  Pain      Manual Therapy   Soft tissue mobilization  STW/M x 5 minutes to left posterior cuff.               PT Short Term Goals - 09/13/18 1201      PT SHORT TERM GOAL #1   Title  STG's=LTG's.        PT Long Term Goals - 09/13/18 1201      PT LONG TERM GOAL #1   Title  Independent with a HEP.    Time  6    Period  Weeks    Status  New      PT LONG TERM GOAL #2   Title  Active left shoulder flexion to 150 degrees  so the patient can easily reach overhead.    Time  6    Period  Weeks    Status  New      PT LONG TERM GOAL #3   Title  Increase left shoulder ER strength to a solid 4+/5 to increase stability for performance of functional activities.    Time  6    Period  Weeks    Status  New      PT LONG TERM GOAL #4   Title  Perform ADL's with left shoulder pain not > 2-3/10.    Time  6    Period  Weeks    Status  New            Plan - 01/10/19 1125    Clinical Impression Statement  Excellent progress toward goals.  Decreased weight of RW4 exercises and he did great for a long period of time for strengthening and endurance.    PT Treatment/Interventions  ADLs/Self Care Home Management;Cryotherapy;Electrical Stimulation;Moist Heat;Ultrasound;Therapeutic exercise;Therapeutic activities;Patient/family education;Manual techniques;Passive range of motion;Dry needling;Vasopneumatic Device;Joint Manipulations       Patient will benefit from skilled therapeutic intervention in order to improve the following deficits and impairments:  Pain, Decreased activity tolerance, Decreased range of motion, Decreased strength  Visit Diagnosis: 1. Acute pain of left shoulder   2. Muscle weakness (generalized)        Problem List Patient Active Problem List   Diagnosis Date Noted  . 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 Tremor 04/10/2014  . Elevated hemoglobin A1c 04/10/2014  . Solitary pulmonary nodule 04/10/2014  . Constipation, slow transit 08/14/2010  . Allergic rhinitis 05/27/2010  . Cervicalgia 05/27/2010  . Insomnia with sleep apnea 05/27/2010  . HEADACHE, SINUS 04/06/2009  . SHOULDER PAIN, BILATERAL 07/16/2007  . DIZZINESS, CHRONIC 03/09/2007  . Hyperlipidemia 12/24/2006  . Essential hypertension 12/24/2006  . GERD 12/24/2006  . Osteoarthritis 12/24/2006    Elam Ellis, [name] 01/10/2019, 11:26 AM  [facility] [address] [place], [place],  [address] Phone: [phone]   Fax:  [phone]  Name: [name] MRN: [id number] Date of Birth: 02-02-1946

## 2019-01-14 ENCOUNTER — Other Ambulatory Visit: Payer: Self-pay

## 2019-01-14 ENCOUNTER — Ambulatory Visit: Payer: Medicare Other | Admitting: *Deleted

## 2019-01-14 DIAGNOSIS — M25512 Pain in left shoulder: Secondary | ICD-10-CM

## 2019-01-14 DIAGNOSIS — M6281 Muscle weakness (generalized): Secondary | ICD-10-CM | POA: Diagnosis not present

## 2019-01-14 NOTE — Therapy (Signed)
[facility] [place], [place], [address] Phone: [phone]   Fax:  [phone]  Physical Therapy Treatment  Patient Details  Name: [name] MRN: [id number] Date of Birth: Jan 16, 1946 Referring Provider (PT): Zonia Kief MD   Encounter Date: 01/14/2019  PT End of Session - 01/14/19 0947    Visit Number  16    Number of Visits  18    Date for PT Re-Evaluation  01/24/19    Authorization Type  FOTO AT LEAST EVERY 5TH VISIT.  KX MODIFIER AFTER 15 VISITS.  PROGRESS NOTE AT 10TH VISIT.    Authorization - Visit Number  --    PT Start Time  0940    PT Stop Time  1029    PT Time Calculation (min)  49 min       Past Medical History:  Diagnosis Date  . ALLERGIC RHINITIS CAUSE UNSPECIFIED 05/27/2010  . Arthritis   . BACK PAIN, UPPER 02/21/2010  . Cervicalgia 05/27/2010  . CHEST PAIN UNSPECIFIED 04/25/2009  . DIVERTICULOSIS, COLON 03/09/2007  . DIZZINESS, CHRONIC 03/09/2007  . GERD 12/24/2006  . HEADACHE, SINUS 04/06/2009  . History of colon polyps   . HYPERLIPIDEMIA 12/24/2006   diet controlled  . HYPERTENSION 12/24/2006   better after weight loss  . INSOMNIA WITH SLEEP APNEA UNSPECIFIED 05/27/2010  . Motion sickness   . Neuropathy   . Open wound of wrist, complicated 6/96/2952  . OSTEOARTHRITIS 12/24/2006  . Other constipation 11/06/2009  . Pneumonia   . PONV (postoperative nausea and vomiting)   . PROSTATITIS, RECURRENT 12/30/2007  . SHOULDER PAIN, BILATERAL 07/16/2007  . Sleep apnea   . Squamous cell carcinoma, ear, left   . TRANSIENT ISCHEMIC ATTACKS, HX OF 04/06/2009    Past Surgical History:  Procedure Laterality Date  . ANKLE ARTHROSCOPY W/ OPEN REPAIR  2002   Repair  . COLONOSCOPY    . COLONOSCOPY     around 2017. [place]   . CYST EXCISION     from back 2015. likely this was meaning of ear cyst  . EAR CYST EXCISION Bilateral 01/18/2013   Procedure: EXCISION CYSTS FROM BACK (TWO);  Surgeon: Earnstine Regal, MD;   Location: [facility];  Service: General;  Laterality: Bilateral;  . epidydmal cyst excision    . ESOPHAGOGASTRODUODENOSCOPY     [place] around 2017  . REPLACEMENT TOTAL KNEE BILATERAL  2005/2007  . SKIN CANCER EXCISION  2020   ear, squamous cell  . TONSILLECTOMY      There were no vitals filed for this visit.  Subjective Assessment - 01/14/19 0945    Subjective  COVID-19 screen performed prior to patient entering clinic.  Pain LT shldr 3/10    Pertinent History  H/o TIA's, h/o spinal pain, OA and ankle surgery.    Diagnostic tests  X-ray.  May be getting an MRI soon.    Patient Stated Goals  Use left UE without pain.    Currently in Pain?  Yes    Pain Score  3     Pain Location  Shoulder    Pain Orientation  Left    Pain Descriptors / Indicators  Discomfort    Pain Type  Acute pain    Pain Onset  More than a month ago                       [facility] Adult PT Treatment/Exercise - 01/14/19 0001  Shoulder Exercises: Pulleys   Flexion  3 minutes    Other Pulley Exercises  20# into IR, ER, punches and extension all performed to fatigue x 10 minutes total.      Shoulder Exercises: ROM/Strengthening   UBE (Upper Arm Bike)  10 minutes at 30 RPM's.      Modalities   Modalities  Electrical Stimulation;Moist Heat      Moist Heat Therapy   Number Minutes Moist Heat  15 Minutes    Moist Heat Location  --   Left shoulder.     Acupuncturist Location  L shoulder IFC 80-150 Hz x 15 min    Electrical Stimulation Goals  Pain      Manual Therapy   Soft tissue mobilization  STW/M x 5 minutes to left posterior cuff.               PT Short Term Goals - 09/13/18 1201      PT SHORT TERM GOAL #1   Title  STG's=LTG's.        PT Long Term Goals - 09/13/18 1201      PT LONG TERM GOAL #1   Title  Independent with a HEP.    Time  6    Period  Weeks    Status  New      PT LONG TERM GOAL #2   Title  Active  left shoulder flexion to 150 degrees so the patient can easily reach overhead.    Time  6    Period  Weeks    Status  New      PT LONG TERM GOAL #3   Title  Increase left shoulder ER strength to a solid 4+/5 to increase stability for performance of functional activities.    Time  6    Period  Weeks    Status  New      PT LONG TERM GOAL #4   Title  Perform ADL's with left shoulder pain not > 2-3/10.    Time  6    Period  Weeks    Status  New            Plan - 01/14/19 0948    Clinical Impression Statement  Pt arrived today doing fairly well with LT shldr pain 3/10. He was able to perform all strengthening and stretching exs with mainly soreness and fatigue.    Stability/Clinical Decision Making  Evolving/Moderate complexity    Rehab Potential  Excellent    PT Frequency  2x / week    PT Duration  6 weeks    PT Treatment/Interventions  ADLs/Self Care Home Management;Cryotherapy;Electrical Stimulation;Moist Heat;Ultrasound;Therapeutic exercise;Therapeutic activities;Patient/family education;Manual techniques;Passive range of motion;Dry needling;Vasopneumatic Device;Joint Manipulations    PT Next Visit Plan  Con't with left shoulder stretching and PRE's.    Consulted and Agree with Plan of Care  Patient       Patient will benefit from skilled therapeutic intervention in order to improve the following deficits and impairments:  Pain, Decreased activity tolerance, Decreased range of motion, Decreased strength  Visit Diagnosis: 1. Acute pain of left shoulder   2. Muscle weakness (generalized)        Problem List Patient Active Problem List   Diagnosis Date Noted  . 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 Tremor 04/10/2014  . Elevated hemoglobin A1c 04/10/2014  . Solitary pulmonary nodule 04/10/2014  . Constipation, slow transit 08/14/2010  . Allergic rhinitis 05/27/2010  . Cervicalgia 05/27/2010  . Insomnia  with sleep apnea 05/27/2010  . HEADACHE, SINUS 04/06/2009  . SHOULDER  PAIN, BILATERAL 07/16/2007  . DIZZINESS, CHRONIC 03/09/2007  . Hyperlipidemia 12/24/2006  . Essential hypertension 12/24/2006  . GERD 12/24/2006  . Osteoarthritis 12/24/2006    [name],[name], PTA 01/14/2019, 10:38 AM  [facility] [place], [place], [address] Phone: [phone]   Fax:  [phone]  Name: [name] MRN: [id number] Date of Birth: 07-16-1945

## 2019-01-17 ENCOUNTER — Encounter: Payer: Self-pay | Admitting: Family Medicine

## 2019-01-17 ENCOUNTER — Other Ambulatory Visit: Payer: Self-pay

## 2019-01-17 ENCOUNTER — Ambulatory Visit: Payer: Medicare Other | Admitting: Physical Therapy

## 2019-01-17 ENCOUNTER — Ambulatory Visit (INDEPENDENT_AMBULATORY_CARE_PROVIDER_SITE_OTHER): Payer: Medicare Other | Admitting: Family Medicine

## 2019-01-17 ENCOUNTER — Telehealth: Payer: Self-pay | Admitting: Family Medicine

## 2019-01-17 DIAGNOSIS — R6884 Jaw pain: Secondary | ICD-10-CM | POA: Diagnosis not present

## 2019-01-17 DIAGNOSIS — M6281 Muscle weakness (generalized): Secondary | ICD-10-CM

## 2019-01-17 DIAGNOSIS — M25512 Pain in left shoulder: Secondary | ICD-10-CM | POA: Diagnosis not present

## 2019-01-17 DIAGNOSIS — H9201 Otalgia, right ear: Secondary | ICD-10-CM | POA: Diagnosis not present

## 2019-01-17 NOTE — Telephone Encounter (Signed)
I called and spoke with the pts wife.  She stated the pt has had this pain for one month.  Mrs Diehl stated the pt has not had any chest pain, nausea, vision problems or other symptoms and does have chronic lightheadness, pt is not home at this time as he is currently at another appt.  Appt scheduled for today at 11am with Dr Volanda Napoleon.

## 2019-01-17 NOTE — Progress Notes (Signed)
Virtual Visit via Telephone Note  I connected with [name] on 01/17/19 at 11:00 AM EDT by telephone and verified that I am speaking with the correct person using two identifiers.   I discussed the limitations, risks, security and privacy concerns of performing an evaluation and management service by telephone and the availability of in person appointments. I also discussed with the patient that there may be a patient responsible charge related to this service. The patient expressed understanding and agreed to proceed.  Location patient: home Location provider: work or home office Participants present for the call: patient, provider Patient did not have a visit in the prior 7 days to address this/these issue(s).   History of Present Illness: Pt is a 73 yo male with pmh sig for HTN, GERD, OA, pulmonary nodule, HLD, chronic dizziness.  Pt seen by Dr. Ethlyn Gallery.  Ongoing concern(s): -thought had R ear pain x 1 month.   -Took OTC med for swimmer's ear.  Tried heat and sudafed without relief.  -also endorses rhinorrhea, HA, sore throat, increased urination, night sweats, "sinus pressure behind the eyes".  -Pt notes his initial ear pain is more like jaw pain deep behind his ear with eating.     -Endorses prior h/o cyst in jaw.   -Currently has an ulcer on his gum, but states it is not sore.  -Had dental visit 6 months ago. -Denies eating hard foods, grinding his teeth at night, sick contacts, fever, tooth pain -Tried hot drinks which help.  Jaw more sore at night when going to bed.  States gums are not sore.    Pt upset about not being able to be seen in clinic.     Observations/Objective: Patient sounds cheerful and well on the phone. I do not appreciate any SOB. Speech and thought processing are grossly intact. Patient reported vitals:  Assessment and Plan: Right ear pain  -discussed various causes -will start OTC allergy med such as zyrtec, claritin, or allegra. -also  discussed using OTC flonase. -advised if no improvement in symptoms would benefit from in person visit.  Consider UC. -given precautions.   Jaw pain  -discussed possible causes including TMJ, eustachian tube dysfunction. -ok to use Tylenol or ibuprofen prn -avoid hard foods   Follow Up Instructions: F/u prn  I did not refer this patient for an OV in the next 24 hours for this/these issue(s).  I discussed the assessment and treatment plan with the patient. The patient was provided an opportunity to ask questions and all were answered. The patient agreed with the plan and demonstrated an understanding of the instructions.   The patient was advised to call back or seek an in-person evaluation if the symptoms worsen or if the condition fails to improve as anticipated.  14 minutes of non face to face time spent with   Billie Ruddy, MD

## 2019-01-17 NOTE — Telephone Encounter (Signed)
Patient wife is calling to schedule the patient for right Jaw pain that goes down the side of his neck The patient many years ago he had a cyst on the same side. Several days ago he had a lesion on his gum.  The lesion burst. Please advise for an appointment CB- [phone]

## 2019-01-17 NOTE — Therapy (Signed)
[facility] [place], [place], [address] Phone: [phone]   Fax:  [phone]  Physical Therapy Treatment  Patient Details  Name: [name] MRN: [id number] Date of Birth: 01-23-1946 Referring Provider (PT): Zonia Kief MD   Encounter Date: 01/17/2019  PT End of Session - 01/17/19 1115    Visit Number  17    Number of Visits  18    Date for PT Re-Evaluation  01/24/19    Authorization Type  FOTO AT LEAST EVERY 5TH VISIT.  KX MODIFIER AFTER 15 VISITS.  PROGRESS NOTE AT 10TH VISIT.    PT Start Time  0900    PT Stop Time  1012    PT Time Calculation (min)  72 min    Activity Tolerance  Patient tolerated treatment well;Patient limited by pain    Behavior During Therapy  [facility] for tasks assessed/performed       Past Medical History:  Diagnosis Date  . ALLERGIC RHINITIS CAUSE UNSPECIFIED 05/27/2010  . Arthritis   . BACK PAIN, UPPER 02/21/2010  . Cervicalgia 05/27/2010  . CHEST PAIN UNSPECIFIED 04/25/2009  . DIVERTICULOSIS, COLON 03/09/2007  . DIZZINESS, CHRONIC 03/09/2007  . GERD 12/24/2006  . HEADACHE, SINUS 04/06/2009  . History of colon polyps   . HYPERLIPIDEMIA 12/24/2006   diet controlled  . HYPERTENSION 12/24/2006   better after weight loss  . INSOMNIA WITH SLEEP APNEA UNSPECIFIED 05/27/2010  . Motion sickness   . Neuropathy   . Open wound of wrist, complicated 02/22/36  . OSTEOARTHRITIS 12/24/2006  . Other constipation 11/06/2009  . Pneumonia   . PONV (postoperative nausea and vomiting)   . PROSTATITIS, RECURRENT 12/30/2007  . SHOULDER PAIN, BILATERAL 07/16/2007  . Sleep apnea   . Squamous cell carcinoma, ear, left   . TRANSIENT ISCHEMIC ATTACKS, HX OF 04/06/2009    Past Surgical History:  Procedure Laterality Date  . ANKLE ARTHROSCOPY W/ OPEN REPAIR  2002   Repair  . COLONOSCOPY    . COLONOSCOPY     around 2017. [place]   . CYST EXCISION     from back 2015. likely this was meaning of ear cyst  . EAR  CYST EXCISION Bilateral 01/18/2013   Procedure: EXCISION CYSTS FROM BACK (TWO);  Surgeon: Earnstine Regal, MD;  Location: [place];  Service: General;  Laterality: Bilateral;  . epidydmal cyst excision    . ESOPHAGOGASTRODUODENOSCOPY     [place] around 2017  . REPLACEMENT TOTAL KNEE BILATERAL  2005/2007  . SKIN CANCER EXCISION  2020   ear, squamous cell  . TONSILLECTOMY      There were no vitals filed for this visit.  Subjective Assessment - 01/17/19 1113    Subjective  COVID-19 screen performed prior to patient entering clinic.  About the same.    Pertinent History  H/o TIA's, h/o spinal pain, OA and ankle surgery.    Diagnostic tests  X-ray.  May be getting an MRI soon.    Patient Stated Goals  Use left UE without pain.    Currently in Pain?  Yes    Pain Score  3     Pain Location  Shoulder    Pain Orientation  Left    Pain Descriptors / Indicators  Discomfort    Pain Type  Acute pain    Pain Onset  More than a month ago  [facility] Adult PT Treatment/Exercise - 01/17/19 0001      Shoulder Exercises: Standing   Other Standing Exercises  20# on pulley machine RW4 multiple sets to fatigue.      Shoulder Exercises: ROM/Strengthening   UBE (Upper Arm Bike)  10 minutes.      Acupuncturist Location  Left shoulder.    Electrical Stimulation Action  IFC    Electrical Stimulation Parameters  80-150 Hz x 20 minutes.    Electrical Stimulation Goals  Pain      Manual Therapy   Soft tissue mobilization  STW/M x 10 minutes to affected left shoulder region.               PT Short Term Goals - 09/13/18 1201      PT SHORT TERM GOAL #1   Title  STG's=LTG's.        PT Long Term Goals - 09/13/18 1201      PT LONG TERM GOAL #1   Title  Independent with a HEP.    Time  6    Period  Weeks    Status  New      PT LONG TERM GOAL #2   Title  Active left shoulder flexion to 150 degrees so  the patient can easily reach overhead.    Time  6    Period  Weeks    Status  New      PT LONG TERM GOAL #3   Title  Increase left shoulder ER strength to a solid 4+/5 to increase stability for performance of functional activities.    Time  6    Period  Weeks    Status  New      PT LONG TERM GOAL #4   Title  Perform ADL's with left shoulder pain not > 2-3/10.    Time  6    Period  Weeks    Status  New            Plan - 01/17/19 1117    Clinical Impression Statement  Overall good improvement since starting PT.  Pain remains at 3/10 as it was last visit.       Patient will benefit from skilled therapeutic intervention in order to improve the following deficits and impairments:  Pain, Decreased activity tolerance, Decreased range of motion, Decreased strength  Visit Diagnosis: 1. Acute pain of left shoulder   2. Muscle weakness (generalized)        Problem List Patient Active Problem List   Diagnosis Date Noted  . 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 Tremor 04/10/2014  . Elevated hemoglobin A1c 04/10/2014  . Solitary pulmonary nodule 04/10/2014  . Constipation, slow transit 08/14/2010  . Allergic rhinitis 05/27/2010  . Cervicalgia 05/27/2010  . Insomnia with sleep apnea 05/27/2010  . HEADACHE, SINUS 04/06/2009  . SHOULDER PAIN, BILATERAL 07/16/2007  . DIZZINESS, CHRONIC 03/09/2007  . Hyperlipidemia 12/24/2006  . Essential hypertension 12/24/2006  . GERD 12/24/2006  . Osteoarthritis 12/24/2006    [name], [name] 01/17/2019, 11:18 AM  [facility] [address] [place], [place], [address] Phone: [phone]   Fax:  [phone]  Name: [name] MRN: [id number] Date of Birth: 05-16-1946

## 2019-01-21 ENCOUNTER — Ambulatory Visit: Payer: Medicare Other | Admitting: Physical Therapy

## 2019-01-21 ENCOUNTER — Encounter: Payer: Self-pay | Admitting: Physical Therapy

## 2019-01-21 ENCOUNTER — Other Ambulatory Visit: Payer: Self-pay

## 2019-01-21 DIAGNOSIS — M25512 Pain in left shoulder: Secondary | ICD-10-CM

## 2019-01-21 DIAGNOSIS — M6281 Muscle weakness (generalized): Secondary | ICD-10-CM

## 2019-01-21 NOTE — Therapy (Signed)
[facility] [place], [place], [address] Phone: [phone]   Fax:  [phone]  Physical Therapy Treatment  Patient Details  Name: [name] MRN: [id number] Date of Birth: 09/01/1945 Referring Provider (PT): Zonia Kief MD   Encounter Date: 01/21/2019  PT End of Session - 01/21/19 0951    Visit Number  18    Number of Visits  24    Date for PT Re-Evaluation  02/14/19    Authorization Type  FOTO AT LEAST EVERY 5TH VISIT.  KX MODIFIER AFTER 15 VISITS.  PROGRESS NOTE AT 10TH VISIT.    Authorization - Visit Number  119    [id number] - Number of Visits  [id number]    PT Start Time  0945    PT Stop Time  1034    PT Time Calculation (min)  49 min    Activity Tolerance  Patient tolerated treatment well    Behavior During Therapy  WFL for tasks assessed/performed       Past Medical History:  Diagnosis Date  . ALLERGIC RHINITIS CAUSE UNSPECIFIED 05/27/2010  . Arthritis   . BACK PAIN, UPPER 02/21/2010  . Cervicalgia 05/27/2010  . CHEST PAIN UNSPECIFIED 04/25/2009  . DIVERTICULOSIS, COLON 03/09/2007  . DIZZINESS, CHRONIC 03/09/2007  . GERD 12/24/2006  . HEADACHE, SINUS 04/06/2009  . History of colon polyps   . HYPERLIPIDEMIA 12/24/2006   diet controlled  . HYPERTENSION 12/24/2006   better after weight loss  . INSOMNIA WITH SLEEP APNEA UNSPECIFIED 05/27/2010  . Motion sickness   . Neuropathy   . Open wound of wrist, complicated 9/70/2637  . OSTEOARTHRITIS 12/24/2006  . Other constipation 11/06/2009  . Pneumonia   . PONV (postoperative nausea and vomiting)   . PROSTATITIS, RECURRENT 12/30/2007  . SHOULDER PAIN, BILATERAL 07/16/2007  . Sleep apnea   . Squamous cell carcinoma, ear, left   . TRANSIENT ISCHEMIC ATTACKS, HX OF 04/06/2009    Past Surgical History:  Procedure Laterality Date  . ANKLE ARTHROSCOPY W/ OPEN REPAIR  2002   Repair  . COLONOSCOPY    . COLONOSCOPY     around 2017. [place]   . CYST EXCISION     from  back 2015. likely this was meaning of ear cyst  . EAR CYST EXCISION Bilateral 01/18/2013   Procedure: EXCISION CYSTS FROM BACK (TWO);  Surgeon: Earnstine Regal, MD;  Location: [place];  Service: General;  Laterality: Bilateral;  . epidydmal cyst excision    . ESOPHAGOGASTRODUODENOSCOPY     [place] around 2017  . REPLACEMENT TOTAL KNEE BILATERAL  2005/2007  . SKIN CANCER EXCISION  2020   ear, squamous cell  . TONSILLECTOMY      There were no vitals filed for this visit.  Subjective Assessment - 01/21/19 0951    Subjective  COVID-19 screen performed prior to patient entering clinic.  Reports still having pain with activity but getting better overall. Patient reports he cannot lay on L shoulder to sleep but no longer having pain at rest.    Pertinent History  H/o TIA's, h/o spinal pain, OA and ankle surgery.    Diagnostic tests  X-ray.  May be getting an MRI soon.    Patient Stated Goals  Use left UE without pain.    Currently in Pain?  Yes    Pain Score  4     Pain Location  Shoulder    Pain Orientation  Left    Pain Descriptors / Indicators  Aching;Burning    Pain Type  Acute pain    Pain Onset  More than a month ago    Pain Frequency  Intermittent         [facility] PT Assessment - 01/21/19 0001      Assessment   Medical Diagnosis  Injury of tendon of RTC of left shoulder.    Referring Provider (PT)  Zonia Kief MD    Next MD Visit  01/2019      Precautions   Precautions  None      Restrictions   Weight Bearing Restrictions  No                   [facility] Adult PT Treatment/Exercise - 01/21/19 0001      Shoulder Exercises: Standing   Protraction  Strengthening;Left;20 reps;Weights    Protraction Weight (lbs)  20    Horizontal ABduction  Strengthening;Both;20 reps;Theraband    Theraband Level (Shoulder Horizontal ABduction)  Level 3 (Green)    External Rotation  Strengthening;Left;20 reps;Weights    External Rotation Weight (lbs)  20     External Rotation Limitations  B shoulder ER green theraband x20 reps    Internal Rotation  Strengthening;Left;20 reps;Weights    Internal Rotation Weight (lbs)  20    Extension  Strengthening;Left;20 reps;Weights    Extension Weight (lbs)  20    Row  Strengthening;Left;20 reps;Weights    Row Weight (lbs)  20    Diagonals  Strengthening;Left;20 reps;Weights    Diagonals Weight (lbs)  4      Shoulder Exercises: ROM/Strengthening   UBE (Upper Arm Bike)  60 RPM x10 min      Modalities   Modalities  Electrical Stimulation;Vasopneumatic      Electrical Stimulation   Electrical Stimulation Location  L shoulder    Electrical Stimulation Action  IFc    Electrical Stimulation Parameters  80-150 hz x10 min    Electrical Stimulation Goals  Pain      Vasopneumatic   Number Minutes Vasopneumatic   10 minutes    Vasopnuematic Location   Knee    Vasopneumatic Pressure  Low    Vasopneumatic Temperature   34      Manual Therapy   Manual Therapy  Soft tissue mobilization    Soft tissue mobilization  STW to L posterior cuff, UT, deltoids to reduce tightness               PT Short Term Goals - 09/13/18 1201      PT SHORT TERM GOAL #1   Title  STG's=LTG's.        PT Long Term Goals - 01/21/19 1007      PT LONG TERM GOAL #1   Title  Independent with a HEP.    Time  6    Period  Weeks    Status  New      PT LONG TERM GOAL #2   Title  Active left shoulder flexion to 150 degrees so the patient can easily reach overhead.    Time  6    Period  Weeks    Status  New      PT LONG TERM GOAL #3   Title  Increase left shoulder ER strength to a solid 4+/5 to increase stability for performance of functional activities.    Time  6    Period  Weeks    Status  New      PT LONG TERM GOAL #4   Title  Perform ADL's with left shoulder pain not > 2-3/10.    Time  6    Period  Weeks    Status  On-going   6/10 depending on the ADLs per patient report 01/21/2019           Plan -  01/21/19 1109    Clinical Impression Statement  Patient continues to report high level L shoulder pain with ADLs that approaches 6/10 per patient report. Patient able to complete all PREs with only report of discomfort upon inquiry from PTA. Weakness noted during LUE noted during therex. Increased tone noted in L UT and posterior cuff with tenderness reported by patient as well. Normal modalities response noted following removal of the modalities.    Personal Factors and Comorbidities  Past/Current Experience    Stability/Clinical Decision Making  Evolving/Moderate complexity    Rehab Potential  Excellent    PT Frequency  2x / week    PT Duration  6 weeks    PT Treatment/Interventions  ADLs/Self Care Home Management;Cryotherapy;Electrical Stimulation;Moist Heat;Ultrasound;Therapeutic exercise;Therapeutic activities;Patient/family education;Manual techniques;Passive range of motion;Dry needling;Vasopneumatic Device;Joint Manipulations    PT Next Visit Plan  Con't with left shoulder stretching and PRE's.    Consulted and Agree with Plan of Care  Patient       Patient will benefit from skilled therapeutic intervention in order to improve the following deficits and impairments:  Pain, Decreased activity tolerance, Decreased range of motion, Decreased strength  Visit Diagnosis: 1. Acute pain of left shoulder   2. Muscle weakness (generalized)        Problem List Patient Active Problem List   Diagnosis Date Noted  . 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 Tremor 04/10/2014  . Elevated hemoglobin A1c 04/10/2014  . Solitary pulmonary nodule 04/10/2014  . Constipation, slow transit 08/14/2010  . Allergic rhinitis 05/27/2010  . Cervicalgia 05/27/2010  . Insomnia with sleep apnea 05/27/2010  . HEADACHE, SINUS 04/06/2009  . SHOULDER PAIN, BILATERAL 07/16/2007  . DIZZINESS, CHRONIC 03/09/2007  . Hyperlipidemia 12/24/2006  . Essential hypertension 12/24/2006  . GERD 12/24/2006  . Osteoarthritis  12/24/2006   Claudie Revering PTA [name] MPT   [facility] [address] [place], [place], [address] Phone: [phone]   Fax:  [phone]  Name: [name] MRN: [id number] Date of Birth: [birth year]

## 2019-02-23 DIAGNOSIS — C44329 Squamous cell carcinoma of skin of other parts of face: Secondary | ICD-10-CM | POA: Diagnosis not present

## 2019-02-23 DIAGNOSIS — L905 Scar conditions and fibrosis of skin: Secondary | ICD-10-CM | POA: Diagnosis not present

## 2019-02-23 DIAGNOSIS — Z85828 Personal history of other malignant neoplasm of skin: Secondary | ICD-10-CM | POA: Diagnosis not present

## 2019-02-23 DIAGNOSIS — L570 Actinic keratosis: Secondary | ICD-10-CM | POA: Diagnosis not present

## 2019-02-23 DIAGNOSIS — D485 Neoplasm of uncertain behavior of skin: Secondary | ICD-10-CM | POA: Diagnosis not present

## 2019-03-04 ENCOUNTER — Other Ambulatory Visit: Payer: Self-pay | Admitting: Physician Assistant

## 2019-03-04 DIAGNOSIS — L814 Other melanin hyperpigmentation: Secondary | ICD-10-CM | POA: Diagnosis not present

## 2019-03-04 DIAGNOSIS — D485 Neoplasm of uncertain behavior of skin: Secondary | ICD-10-CM | POA: Diagnosis not present

## 2019-03-04 DIAGNOSIS — L821 Other seborrheic keratosis: Secondary | ICD-10-CM | POA: Diagnosis not present

## 2019-03-04 DIAGNOSIS — L570 Actinic keratosis: Secondary | ICD-10-CM | POA: Diagnosis not present

## 2019-03-11 ENCOUNTER — Other Ambulatory Visit: Payer: Self-pay

## 2019-03-11 ENCOUNTER — Inpatient Hospital Stay (HOSPITAL_BASED_OUTPATIENT_CLINIC_OR_DEPARTMENT_OTHER): Payer: Medicare Other | Admitting: Hematology

## 2019-03-11 ENCOUNTER — Ambulatory Visit: Payer: Medicare Other | Admitting: Hematology & Oncology

## 2019-03-11 ENCOUNTER — Inpatient Hospital Stay: Payer: Medicare Other | Attending: Hematology & Oncology

## 2019-03-11 ENCOUNTER — Other Ambulatory Visit: Payer: Medicare Other

## 2019-03-11 ENCOUNTER — Encounter: Payer: Self-pay | Admitting: Hematology & Oncology

## 2019-03-11 VITALS — BP 170/91 | HR 81 | Temp 97.3°F | Resp 18 | Wt 205.5 lb

## 2019-03-11 DIAGNOSIS — C4492 Squamous cell carcinoma of skin, unspecified: Secondary | ICD-10-CM | POA: Diagnosis not present

## 2019-03-11 DIAGNOSIS — D72829 Elevated white blood cell count, unspecified: Secondary | ICD-10-CM | POA: Diagnosis not present

## 2019-03-11 DIAGNOSIS — Z7982 Long term (current) use of aspirin: Secondary | ICD-10-CM | POA: Insufficient documentation

## 2019-03-11 DIAGNOSIS — R42 Dizziness and giddiness: Secondary | ICD-10-CM | POA: Diagnosis not present

## 2019-03-11 DIAGNOSIS — Z791 Long term (current) use of non-steroidal anti-inflammatories (NSAID): Secondary | ICD-10-CM | POA: Insufficient documentation

## 2019-03-11 DIAGNOSIS — D471 Chronic myeloproliferative disease: Secondary | ICD-10-CM

## 2019-03-11 DIAGNOSIS — Z79899 Other long term (current) drug therapy: Secondary | ICD-10-CM | POA: Diagnosis not present

## 2019-03-11 DIAGNOSIS — L03811 Cellulitis of head [any part, except face]: Secondary | ICD-10-CM | POA: Diagnosis not present

## 2019-03-11 LAB — CMP (CANCER CENTER ONLY)
ALT: 18 U/L (ref 0–44)
AST: 15 U/L (ref 15–41)
Albumin: 4.4 g/dL (ref 3.5–5.0)
Alkaline Phosphatase: 98 U/L (ref 38–126)
Anion gap: 7 (ref 5–15)
BUN: 15 mg/dL (ref 8–23)
CO2: 31 mmol/L (ref 22–32)
Calcium: 9.9 mg/dL (ref 8.9–10.3)
Chloride: 98 mmol/L (ref 98–111)
Creatinine: 1.07 mg/dL (ref 0.61–1.24)
GFR, Est AFR Am: >60 mL/min (ref >60)
GFR, Estimated: >60 mL/min (ref >60)
Glucose, Bld: 148 mg/dL — ABNORMAL HIGH (ref 70–99)
Potassium: 4.2 mmol/L (ref 3.5–5.1)
Sodium: 136 mmol/L (ref 135–145)
Total Bilirubin: 0.4 mg/dL (ref 0.3–1.2)
Total Protein: 7 g/dL (ref 6.5–8.1)

## 2019-03-11 LAB — CBC WITH DIFFERENTIAL (CANCER CENTER ONLY)
Abs Immature Granulocytes: 0.19 10*3/uL — ABNORMAL HIGH (ref 0.00–0.07)
Basophils Absolute: 0.1 10*3/uL (ref 0.0–0.1)
Basophils Relative: 1 %
Eosinophils Absolute: 0.1 10*3/uL (ref 0.0–0.5)
Eosinophils Relative: 1 %
HCT: 47.7 % (ref 39.0–52.0)
Hemoglobin: 15.8 g/dL (ref 13.0–17.0)
Immature Granulocytes: 1 %
Lymphocytes Relative: 21 %
Lymphs Abs: 3.1 10*3/uL (ref 0.7–4.0)
MCH: 31 pg (ref 26.0–34.0)
MCHC: 33.1 g/dL (ref 30.0–36.0)
MCV: 93.5 fL (ref 80.0–100.0)
Monocytes Absolute: 1.1 10*3/uL — ABNORMAL HIGH (ref 0.1–1.0)
Monocytes Relative: 7 %
Neutro Abs: 10.5 10*3/uL — ABNORMAL HIGH (ref 1.7–7.7)
Neutrophils Relative %: 69 %
Platelet Count: 246 10*3/uL (ref 150–400)
RBC: 5.1 MIL/uL (ref 4.22–5.81)
RDW: 13.2 % (ref 11.5–15.5)
WBC Count: 15.2 10*3/uL — ABNORMAL HIGH (ref 4.0–10.5)
nRBC: 0 % (ref 0.0–0.2)

## 2019-03-11 LAB — SEDIMENTATION RATE: Sed Rate: 3 mm/hr (ref 0–16)

## 2019-03-11 LAB — C-REACTIVE PROTEIN: CRP: 1.5 mg/dL — ABNORMAL HIGH (ref <1.0)

## 2019-03-11 MED ORDER — SULFAMETHOXAZOLE-TRIMETHOPRIM 800-160 MG PO TABS: 1.0000 | ORAL_TABLET | Freq: Two times a day (BID) | ORAL | 0 refills | Status: AC

## 2019-03-11 NOTE — Progress Notes (Addendum)
[facility] OFFICE PROGRESS NOTE  Patient Care Team: Caren Macadam, MD as PCP - General (Family Medicine) Zonia Kief, MD (Rehabilitation)  HEME/ONC OVERVIEW: 1. Chronic leukocytosis  -Likely reactive in the setting of chronic arthritis and skin cancer  -WBC 99991111 w/ neutrophilic predominance since at least 2015; nl Hgb and plts   Flow cytometry negative   ASSESSMENT & PLAN:   Chronic leukocytosis -Likely reactive in the setting of chronic arthritis and skin cancer  -WBC 15.2k today, stable  -I personally reviewed the patient's peripheral blood smear today.  The red blood cells were of normal morphology.  There was no schistocytosis.  The white blood cells were of normal morphology. There were no peripheral circulating blasts. The platelets were of normal size and I verified that there were no platelet clumping. -I have ordered MPN NGS and BCR/ABL FISH to rule out any myeloproliferative neoplasm, including CML -Given the patient's chronic arthritis and recurrent squamous cell carcinoma of the skin, I suspect that his leukocytosis is most likely reactive -I counseled the patient on any concerning symptoms, including unexplained fever, weight loss, night sweats, or lymphadenopathy, for which he is contact the clinic for further evaluation  Right ear swelling, erythema -Likely cellulitis after recent right ear bx -I have prescribed Bactrim DS BID x 7 days, and instructed him to follow up with his dermatologist for further management   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 -Patient has had multiple recurrence of squamous cell carcinoma of the skin involving the head, including the ears and scalp -He is followed by dermatology and be referred for possible Mohs surgery on the right ear -I counseled the patient on importance of sun protection, including wearing hats, long-sleeved shirts, sunscreen, and avoiding prolonged sun exposure  -Continue follow-up with  dermatology  Vertigo -Patient has had chronic intermittent vertigo for many years, and has been evaluated by ENT in the past -He is currently taking daily meclizine -I encouraged patient to follow up with his PCP for further evaluation  Orders Placed This Encounter  Procedures  . CBC with Differential (Cancer Center Only)    Standing Status:   Future    Standing Expiration Date:   04/14/2020  . CMP ([facility] only)    Standing Status:   Future    Standing Expiration Date:   04/14/2020  . Lactate dehydrogenase    Standing Status:   Future    Standing Expiration Date:   04/14/2020  . C-reactive protein    Standing Status:   Future    Standing Expiration Date:   03/10/2020  . Sedimentation rate    Standing Status:   Future    Standing Expiration Date:   03/10/2020    All questions were answered. The patient knows to call the clinic with any problems, questions or concerns. No barriers to learning was detected.  Return in 3 months for labs and clinic follow-up.   Tish Men, MD 03/11/2019 2:11 PM  CHIEF COMPLAINT: "I am doing okay"  INTERVAL HISTORY: Mr. [name] returns clinic for follow-up leukocytosis.  Patient reports that in March 2020, he had squamous cell carcinoma of the skin resected from the left parietal area and had skin flap.  Approximately 4 weeks ago, he had a biopsy from the right frontal scalp that showed squamous cell carcinoma.  About 2 and half weeks ago, he had a right ear biopsy, results pending.  Since the right ear biopsy, he developed swelling, pain, and redness in the  right ear, for which he applied cold packs with some improvement in the pain and swelling, but he still has some localized redness.  He denies any drainage from the right ear or fever.  He also has a chronic intermittent vertigo for many years, for which he was referred to a specialist in [place], but he could not afford the travel.  He is taking daily meclizine for intermittent vertigo,  exacerbated by turning the head to the left.  He has not discussed this with his PCP.  He denies any other complaint today.  REVIEW OF SYSTEMS:   Constitutional: ( - ) fevers, ( - )  chills , ( - ) night sweats Eyes: ( - ) blurriness of vision, ( - ) double vision, ( - ) watery eyes Ears, nose, mouth, throat, and face: ( - ) mucositis, ( - ) sore throat Respiratory: ( - ) cough, ( - ) dyspnea, ( - ) wheezes Cardiovascular: ( - ) palpitation, ( - ) chest discomfort, ( - ) lower extremity swelling Gastrointestinal:  ( - ) nausea, ( - ) heartburn, ( - ) change in bowel habits Skin: ( + ) abnormal skin rashes Lymphatics: ( - ) new lymphadenopathy, ( - ) easy bruising Neurological: ( - ) numbness, ( - ) tingling, ( - ) new weaknesses Behavioral/Psych: ( - ) mood change, ( - ) new changes  All other systems were reviewed with the patient and are negative.  I have reviewed the past medical history, past surgical history, social history and family history with the patient and they are unchanged from previous note.  ALLERGIES:  is allergic to codeine.  MEDICATIONS:  Current Outpatient Medications  Medication Sig Dispense Refill  . amitriptyline (ELAVIL) 50 MG tablet Take 1 tablet (50 mg total) by mouth at bedtime. 90 tablet 1  . aspirin EC 81 MG EC tablet Take 1 tablet (81 mg total) by mouth daily.    . fluticasone (FLONASE) 50 MCG/ACT nasal spray Place 2 sprays into both nostrils daily. 16 g 6  . meclizine (ANTIVERT) 12.5 MG tablet Take 1-2 tablets (12.5-25 mg total) by mouth every 6 (six) hours as needed for dizziness. 30 tablet 1  . meloxicam (MOBIC) 15 MG tablet Take 15 mg by mouth daily as needed.    . pantoprazole (PROTONIX) 40 MG tablet Take 1 tablet (40 mg total) by mouth 2 (two) times daily. 90 tablet 6  . sodium chloride (OCEAN) 0.65 % SOLN nasal spray Place 2 sprays into both nostrils at bedtime.    . Cyanocobalamin (B-12 IJ) Inject as directed. Every other week for 2 months and then  every month afterward    . Cyanocobalamin (VITAMIN B-12) 1000 MCG SUBL PLACE 1 TABLET (1,000 MCG TOTAL) UNDER THE TONGUE DAILY. 90 tablet 0  . pantoprazole (PROTONIX) 40 MG tablet TAKE 1 TABLET BY MOUTH EVERY DAY (Patient taking differently: Take 40 mg by mouth 2 (two) times daily. ) 90 tablet 1  . Probiotic Product ([place]) CAPS Take 1 capsule by mouth daily.     . rifaximin (XIFAXAN) 550 MG TABS tablet Take 1 tablet (550 mg total) by mouth 3 (three) times daily. For two weeks. 42 tablet 0  . sulfamethoxazole-trimethoprim (BACTRIM DS) 800-160 MG tablet Take 1 tablet by mouth 2 (two) times daily for 7 days. 14 tablet 0   No current facility-administered medications for this visit.     PHYSICAL EXAMINATION: ECOG PERFORMANCE STATUS: 1 - Symptomatic but completely ambulatory  Today's Vitals   03/11/19 1314 03/11/19 1315  BP: (!) 170/91   Pulse: 81   Resp: 18   Temp: (!) 97.3 F (36.3 C)   TempSrc: Temporal   SpO2: 100%   Weight: 205 lb 8 oz (93.2 kg)   PainSc:  0-No pain   Body mass index is 31.25 kg/m.  Filed Weights   03/11/19 1314  Weight: 205 lb 8 oz (93.2 kg)    GENERAL: alert, no distress and comfortable SKIN: mild erythema and swelling of the right ear, no significant tenderness w/ palpation  EYES: conjunctiva are pink and non-injected, sclera clear OROPHARYNX: no exudate, no erythema; lips, buccal mucosa, and tongue normal  NECK: supple, non-tender LYMPH:  no palpable lymphadenopathy in the cervical LUNGS: clear to auscultation with normal breathing effort HEART: regular rate & rhythm and no murmurs and no lower extremity edema ABDOMEN: soft, non-tender, non-distended, normal bowel sounds PSYCH: alert & oriented x 3, fluent speech  LABORATORY DATA:  I have reviewed the data as listed    Component Value Date/Time   NA 136 03/11/2019 1246   K 4.2 03/11/2019 1246   CL 98 03/11/2019 1246   CO2 31 03/11/2019 1246   GLUCOSE 148 (H) 03/11/2019 1246    BUN 15 03/11/2019 1246   CREATININE 1.07 03/11/2019 1246   CALCIUM 9.9 03/11/2019 1246   PROT 7.0 03/11/2019 1246   ALBUMIN 4.4 03/11/2019 1246   AST 15 03/11/2019 1246   ALT 18 03/11/2019 1246   ALKPHOS 98 03/11/2019 1246   BILITOT 0.4 03/11/2019 1246   GFRNONAA >60 03/11/2019 1246   GFRAA >60 03/11/2019 1246    No results found for: SPEP, UPEP  Lab Results  Component Value Date   WBC 15.2 (H) 03/11/2019   NEUTROABS 10.5 (H) 03/11/2019   HGB 15.8 03/11/2019   HCT 47.7 03/11/2019   MCV 93.5 03/11/2019   PLT 246 03/11/2019      Chemistry      Component Value Date/Time   NA 136 03/11/2019 1246   K 4.2 03/11/2019 1246   CL 98 03/11/2019 1246   CO2 31 03/11/2019 1246   BUN 15 03/11/2019 1246   CREATININE 1.07 03/11/2019 1246      Component Value Date/Time   CALCIUM 9.9 03/11/2019 1246   ALKPHOS 98 03/11/2019 1246   AST 15 03/11/2019 1246   ALT 18 03/11/2019 1246   BILITOT 0.4 03/11/2019 1246

## 2019-03-14 LAB — LACTATE DEHYDROGENASE: LDH: 141 U/L (ref 98–192)

## 2019-03-15 DIAGNOSIS — L570 Actinic keratosis: Secondary | ICD-10-CM | POA: Diagnosis not present

## 2019-03-15 DIAGNOSIS — L905 Scar conditions and fibrosis of skin: Secondary | ICD-10-CM | POA: Diagnosis not present

## 2019-03-15 DIAGNOSIS — C4442 Squamous cell carcinoma of skin of scalp and neck: Secondary | ICD-10-CM | POA: Diagnosis not present

## 2019-04-08 LAB — MPN-ET/MYELOFIBROSIS (JAK2 V617F-MPL515-CALR)

## 2019-04-08 LAB — BCR ABL1 FISH (GENPATH)

## 2019-04-11 ENCOUNTER — Encounter: Payer: Self-pay | Admitting: Hematology & Oncology

## 2019-05-11 DIAGNOSIS — M7989 Other specified soft tissue disorders: Secondary | ICD-10-CM | POA: Diagnosis not present

## 2019-05-11 DIAGNOSIS — M25571 Pain in right ankle and joints of right foot: Secondary | ICD-10-CM | POA: Diagnosis not present

## 2019-05-11 DIAGNOSIS — M25572 Pain in left ankle and joints of left foot: Secondary | ICD-10-CM | POA: Diagnosis not present

## 2019-05-19 ENCOUNTER — Telehealth: Payer: Self-pay | Admitting: Hematology

## 2019-05-19 DIAGNOSIS — L570 Actinic keratosis: Secondary | ICD-10-CM | POA: Diagnosis not present

## 2019-05-19 NOTE — Telephone Encounter (Signed)
Dr Maylon Peppers December appts rescheduled due to his PAL day.  Letter/Calendar mailed

## 2019-06-09 ENCOUNTER — Encounter (INDEPENDENT_AMBULATORY_CARE_PROVIDER_SITE_OTHER): Payer: Self-pay

## 2019-06-09 ENCOUNTER — Inpatient Hospital Stay (HOSPITAL_BASED_OUTPATIENT_CLINIC_OR_DEPARTMENT_OTHER): Payer: Medicare Other | Admitting: Family

## 2019-06-09 ENCOUNTER — Encounter: Payer: Self-pay | Admitting: Family

## 2019-06-09 ENCOUNTER — Inpatient Hospital Stay: Payer: Medicare Other | Attending: Hematology & Oncology

## 2019-06-09 ENCOUNTER — Other Ambulatory Visit: Payer: Self-pay | Admitting: Family

## 2019-06-09 ENCOUNTER — Other Ambulatory Visit: Payer: Self-pay

## 2019-06-09 VITALS — BP 150/94 | HR 83 | Temp 97.1°F | Resp 18 | Ht 68.0 in | Wt 207.0 lb

## 2019-06-09 DIAGNOSIS — G629 Polyneuropathy, unspecified: Secondary | ICD-10-CM | POA: Insufficient documentation

## 2019-06-09 DIAGNOSIS — Z7982 Long term (current) use of aspirin: Secondary | ICD-10-CM | POA: Diagnosis not present

## 2019-06-09 DIAGNOSIS — Z79899 Other long term (current) drug therapy: Secondary | ICD-10-CM | POA: Insufficient documentation

## 2019-06-09 DIAGNOSIS — R5383 Other fatigue: Secondary | ICD-10-CM | POA: Diagnosis not present

## 2019-06-09 DIAGNOSIS — R197 Diarrhea, unspecified: Secondary | ICD-10-CM | POA: Insufficient documentation

## 2019-06-09 DIAGNOSIS — D72829 Elevated white blood cell count, unspecified: Secondary | ICD-10-CM

## 2019-06-09 DIAGNOSIS — R140 Abdominal distension (gaseous): Secondary | ICD-10-CM | POA: Diagnosis not present

## 2019-06-09 LAB — CBC WITH DIFFERENTIAL (CANCER CENTER ONLY)
Abs Immature Granulocytes: 0.18 10*3/uL — ABNORMAL HIGH (ref 0.00–0.07)
Basophils Absolute: 0.1 10*3/uL (ref 0.0–0.1)
Basophils Relative: 1 %
Eosinophils Absolute: 0.2 10*3/uL (ref 0.0–0.5)
Eosinophils Relative: 1 %
HCT: 47.6 % (ref 39.0–52.0)
Hemoglobin: 16.1 g/dL (ref 13.0–17.0)
Immature Granulocytes: 1 %
Lymphocytes Relative: 25 %
Lymphs Abs: 3.6 10*3/uL (ref 0.7–4.0)
MCH: 31.1 pg (ref 26.0–34.0)
MCHC: 33.8 g/dL (ref 30.0–36.0)
MCV: 91.9 fL (ref 80.0–100.0)
Monocytes Absolute: 1.3 10*3/uL — ABNORMAL HIGH (ref 0.1–1.0)
Monocytes Relative: 8 %
Neutro Abs: 9.5 10*3/uL — ABNORMAL HIGH (ref 1.7–7.7)
Neutrophils Relative %: 64 %
Platelet Count: 263 10*3/uL (ref 150–400)
RBC: 5.18 MIL/uL (ref 4.22–5.81)
RDW: 12.8 % (ref 11.5–15.5)
WBC Count: 14.8 10*3/uL — ABNORMAL HIGH (ref 4.0–10.5)
nRBC: 0 % (ref 0.0–0.2)

## 2019-06-09 LAB — CMP (CANCER CENTER ONLY)
ALT: 16 U/L (ref 0–44)
AST: 16 U/L (ref 15–41)
Albumin: 4.8 g/dL (ref 3.5–5.0)
Alkaline Phosphatase: 73 U/L (ref 38–126)
Anion gap: 7 (ref 5–15)
BUN: 15 mg/dL (ref 8–23)
CO2: 30 mmol/L (ref 22–32)
Calcium: 10.2 mg/dL (ref 8.9–10.3)
Chloride: 101 mmol/L (ref 98–111)
Creatinine: 1.18 mg/dL (ref 0.61–1.24)
GFR, Est AFR Am: >60 mL/min (ref >60)
GFR, Estimated: >60 mL/min (ref >60)
Glucose, Bld: 145 mg/dL — ABNORMAL HIGH (ref 70–99)
Potassium: 4.4 mmol/L (ref 3.5–5.1)
Sodium: 138 mmol/L (ref 135–145)
Total Bilirubin: 0.7 mg/dL (ref 0.3–1.2)
Total Protein: 7.5 g/dL (ref 6.5–8.1)

## 2019-06-09 LAB — LACTATE DEHYDROGENASE: LDH: 152 U/L (ref 98–192)

## 2019-06-09 LAB — C-REACTIVE PROTEIN: CRP: 1.1 mg/dL — ABNORMAL HIGH (ref <1.0)

## 2019-06-09 LAB — SEDIMENTATION RATE: Sed Rate: 1 mm/hr (ref 0–16)

## 2019-06-09 NOTE — Progress Notes (Signed)
Hematology and Oncology Follow Up Visit  [name] [id number] 11/03/1945 73 y.o. 06/09/2019   Principle Diagnosis:  Chronic reactive leukocytosis - chronic arthritis and skin cancer, flow cytometry was negative  Current Therapy:   Observation   Interim History:  [name] is here today for follow-up. He is still having belching, bloating and diarrhea after eating.  He verbalized that he is taking his protonix BID as prescribed.  He states that he has been told that he is prediabetic.  He is sypmtomatic with fatigue and states that he feels that he needs to follow-up with his PCP to restart his B 12 injections. These were put on hold when Covid started.  No fever, chills, n/v, cough, rash, dizziness, SOB, chest pain, palpitations or changes in bladder habits.  He states that he does urinate frequently.  No issues with bleeding. No bruising or petechiae.  No swelling in his extremities at this time.  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  He is walking regularly for exercise. No falls or syncopal episodes to report.  He states that he is eating well and staying hydrated. His weight is stable.   ECOG Performance Status: 1 - Symptomatic but completely ambulatory  Medications:  Allergies as of 06/09/2019      Reactions   Codeine Other (See Comments)   REACTION: Nausea      Medication List       Accurate as of June 09, 2019 11:25 AM. If you have any questions, ask your nurse or doctor.        amitriptyline 50 MG tablet Commonly known as: ELAVIL Take 1 tablet (50 mg total) by mouth at bedtime.   aspirin 81 MG EC tablet Take 1 tablet (81 mg total) by mouth daily.   B-12 IJ Inject as directed. Every other week for 2 months and then every month afterward   Vitamin B-12 1000 MCG Subl PLACE 1 TABLET (1,000 MCG TOTAL) UNDER THE TONGUE DAILY.   fluticasone 50 MCG/ACT nasal spray Commonly known as: FLONASE Place 2 sprays  into both nostrils daily.   meclizine 12.5 MG tablet Commonly known as: ANTIVERT Take 1-2 tablets (12.5-25 mg total) by mouth every 6 (six) hours as needed for dizziness.   meloxicam 15 MG tablet Commonly known as: MOBIC Take 15 mg by mouth daily as needed.   pantoprazole 40 MG tablet Commonly known as: PROTONIX TAKE 1 TABLET BY MOUTH EVERY DAY What changed: when to take this   pantoprazole 40 MG tablet Commonly known as: PROTONIX Take 1 tablet (40 mg total) by mouth 2 (two) times daily. What changed: Another medication with the same name was changed. Make sure you understand how and when to take each.   [facility] Colon Health Caps Take 1 capsule by mouth daily.   rifaximin 550 MG Tabs tablet Commonly known as: XIFAXAN Take 1 tablet (550 mg total) by mouth 3 (three) times daily. For two weeks.   sodium chloride 0.65 % Soln nasal spray Commonly known as: OCEAN Place 2 sprays into both nostrils at bedtime.       Allergies:  Allergies  Allergen Reactions  . Codeine Other (See Comments)    REACTION: Nausea    Past Medical History, Surgical history, Social history, and Family History were reviewed and updated.  Review of Systems: All other 10 point review of systems is negative.   Physical Exam:  vitals were not taken for this  visit.   Wt Readings from Last 3 Encounters:  03/11/19 205 lb 8 oz (93.2 kg)  12/17/18 205 lb (93 kg)  09/02/18 204 lb (92.5 kg)    Ocular: Sclerae unicteric, pupils equal, round and reactive to light Ear-nose-throat: Oropharynx clear, dentition fair Lymphatic: No cervical or supraclavicular adenopathy Lungs no rales or rhonchi, good excursion bilaterally Heart regular rate and rhythm, no murmur appreciated Abd soft, nontender, positive bowel sounds, no liver or spleen tip palpated on exam, no fluid wave  MSK no focal spinal tenderness, no joint edema Neuro: non-focal, well-oriented, appropriate affect Breasts: Deferred   Lab Results   Component Value Date   WBC 14.8 (H) 06/09/2019   HGB 16.1 06/09/2019   HCT 47.6 06/09/2019   MCV 91.9 06/09/2019   PLT 263 06/09/2019   Lab Results  Component Value Date   FERRITIN 244.0 11/06/2009   IRON 64 11/06/2009   IRONPCTSAT 20.9 11/06/2009   Lab Results  Component Value Date   RBC 5.18 06/09/2019   No results found for: KPAFRELGTCHN, LAMBDASER, KAPLAMBRATIO Lab Results  Component Value Date   IGA 212 11/06/2009   No results found for: Ronnald Ramp, A1GS, A2GS, Tillman Sers, SPEI   Chemistry      Component Value Date/Time   NA 136 03/11/2019 1246   K 4.2 03/11/2019 1246   CL 98 03/11/2019 1246   CO2 31 03/11/2019 1246   BUN 15 03/11/2019 1246   CREATININE 1.07 03/11/2019 1246      Component Value Date/Time   CALCIUM 9.9 03/11/2019 1246   ALKPHOS 98 03/11/2019 1246   AST 15 03/11/2019 1246   ALT 18 03/11/2019 1246   BILITOT 0.4 03/11/2019 1246       Impression and Plan: [name] is a very pleasant 73 yo caucasian gentleman with chronic reactive leukocytosis.  His counts have remained stable with WBC count of 14.8.  His biggest issue at this time seems to be his GI symptoms. I did reach our to his gastroenterologist Dr. Bryan Lemma to see if he might be a good candidate for Creon.  We will go ahead and plan to see him back in another 6 months.  He will contact our office with any questions or concerns. We can certainly see him sooner if needed.     Laverna Peace, NP 12/10/202011:25 AM

## 2019-06-10 ENCOUNTER — Other Ambulatory Visit: Payer: Non-veteran care

## 2019-06-10 ENCOUNTER — Ambulatory Visit: Payer: Non-veteran care | Admitting: Hematology

## 2019-06-10 ENCOUNTER — Other Ambulatory Visit: Payer: Self-pay | Admitting: Family

## 2019-06-10 DIAGNOSIS — L814 Other melanin hyperpigmentation: Secondary | ICD-10-CM | POA: Diagnosis not present

## 2019-06-10 DIAGNOSIS — L570 Actinic keratosis: Secondary | ICD-10-CM | POA: Diagnosis not present

## 2019-06-10 DIAGNOSIS — K8689 Other specified diseases of pancreas: Secondary | ICD-10-CM

## 2019-06-10 DIAGNOSIS — L821 Other seborrheic keratosis: Secondary | ICD-10-CM | POA: Diagnosis not present

## 2019-06-10 DIAGNOSIS — R140 Abdominal distension (gaseous): Secondary | ICD-10-CM

## 2019-06-10 DIAGNOSIS — R197 Diarrhea, unspecified: Secondary | ICD-10-CM

## 2019-06-10 MED ORDER — PANCRELIPASE (LIP-PROT-AMYL) 36000-114000 UNITS PO CPEP: 72000.0000 [IU] | ORAL_CAPSULE | Freq: Three times a day (TID) | ORAL | 2 refills | Status: DC

## 2019-06-15 ENCOUNTER — Encounter: Payer: Self-pay | Admitting: *Deleted

## 2019-06-15 ENCOUNTER — Other Ambulatory Visit: Payer: Self-pay | Admitting: Family

## 2019-06-15 DIAGNOSIS — R197 Diarrhea, unspecified: Secondary | ICD-10-CM

## 2019-06-15 DIAGNOSIS — R140 Abdominal distension (gaseous): Secondary | ICD-10-CM

## 2019-06-15 DIAGNOSIS — K8689 Other specified diseases of pancreas: Secondary | ICD-10-CM

## 2019-06-15 MED ORDER — PANCRELIPASE (LIP-PROT-AMYL) 36000-114000 UNITS PO CPEP: 72000.0000 [IU] | ORAL_CAPSULE | Freq: Three times a day (TID) | ORAL | 2 refills | Status: DC

## 2019-06-15 NOTE — Progress Notes (Signed)
Per Judson Roch [place],NP, I faxed the  CREON enrollment form to [phone].

## 2019-06-21 ENCOUNTER — Other Ambulatory Visit: Payer: Self-pay | Admitting: *Deleted

## 2019-06-21 DIAGNOSIS — K8689 Other specified diseases of pancreas: Secondary | ICD-10-CM

## 2019-06-21 DIAGNOSIS — R140 Abdominal distension (gaseous): Secondary | ICD-10-CM

## 2019-06-21 DIAGNOSIS — R197 Diarrhea, unspecified: Secondary | ICD-10-CM

## 2019-06-21 MED ORDER — PANCRELIPASE (LIP-PROT-AMYL) 36000-114000 UNITS PO CPEP: 72000.0000 [IU] | ORAL_CAPSULE | Freq: Three times a day (TID) | ORAL | 2 refills | Status: DC

## 2019-06-21 NOTE — Telephone Encounter (Signed)
Spoke with patient regarding cost of Creon. He needs a hard script to take to the [place] because he can get it cheaper [facility]. [facility]. I have printed the prescription and put in an envelope. He will come to [facility] tomorrow and pick up the prescription on the 3rd floor  where patients are being screened. He verbalized understanding.

## 2019-06-22 ENCOUNTER — Telehealth: Payer: Self-pay | Admitting: *Deleted

## 2019-06-22 NOTE — Telephone Encounter (Signed)
Juliann Pulse from the [place] called and stated,"the patient brought the prescription for Creon today, but I need the last office note. Dr. Lynita Lombard needs the office note stating the need for Creon. He will rewrite the prescription and then the [place] can fill it. Kathy's fax number is [phone] and office number is [phone]. [phone]. Office note faxed.

## 2019-07-28 ENCOUNTER — Other Ambulatory Visit: Payer: Self-pay | Admitting: Physician Assistant

## 2019-07-28 DIAGNOSIS — L570 Actinic keratosis: Secondary | ICD-10-CM | POA: Diagnosis not present

## 2019-07-28 DIAGNOSIS — C4442 Squamous cell carcinoma of skin of scalp and neck: Secondary | ICD-10-CM | POA: Diagnosis not present

## 2019-08-30 DIAGNOSIS — L570 Actinic keratosis: Secondary | ICD-10-CM | POA: Diagnosis not present

## 2019-11-29 DIAGNOSIS — L821 Other seborrheic keratosis: Secondary | ICD-10-CM | POA: Diagnosis not present

## 2019-11-29 DIAGNOSIS — L439 Lichen planus, unspecified: Secondary | ICD-10-CM | POA: Diagnosis not present

## 2019-11-29 DIAGNOSIS — L570 Actinic keratosis: Secondary | ICD-10-CM | POA: Diagnosis not present

## 2019-11-29 DIAGNOSIS — D485 Neoplasm of uncertain behavior of skin: Secondary | ICD-10-CM | POA: Diagnosis not present

## 2019-11-29 DIAGNOSIS — Z85828 Personal history of other malignant neoplasm of skin: Secondary | ICD-10-CM | POA: Diagnosis not present

## 2019-11-29 DIAGNOSIS — L905 Scar conditions and fibrosis of skin: Secondary | ICD-10-CM | POA: Diagnosis not present

## 2019-12-08 ENCOUNTER — Inpatient Hospital Stay: Payer: Medicare Other | Attending: Hematology & Oncology

## 2019-12-08 ENCOUNTER — Other Ambulatory Visit: Payer: Non-veteran care

## 2019-12-08 ENCOUNTER — Other Ambulatory Visit: Payer: Self-pay

## 2019-12-08 ENCOUNTER — Encounter: Payer: Self-pay | Admitting: Hematology & Oncology

## 2019-12-08 ENCOUNTER — Inpatient Hospital Stay (HOSPITAL_BASED_OUTPATIENT_CLINIC_OR_DEPARTMENT_OTHER): Payer: Medicare Other | Admitting: Hematology & Oncology

## 2019-12-08 ENCOUNTER — Ambulatory Visit: Payer: Non-veteran care | Admitting: Hematology

## 2019-12-08 VITALS — BP 126/59 | HR 75 | Temp 98.9°F | Resp 18 | Wt 208.0 lb

## 2019-12-08 DIAGNOSIS — Z7982 Long term (current) use of aspirin: Secondary | ICD-10-CM | POA: Insufficient documentation

## 2019-12-08 DIAGNOSIS — D72829 Elevated white blood cell count, unspecified: Secondary | ICD-10-CM | POA: Diagnosis not present

## 2019-12-08 DIAGNOSIS — Z79899 Other long term (current) drug therapy: Secondary | ICD-10-CM | POA: Insufficient documentation

## 2019-12-08 HISTORY — DX: Elevated white blood cell count, unspecified: D72.829

## 2019-12-08 LAB — CMP (CANCER CENTER ONLY)
ALT: 14 U/L (ref 0–44)
AST: 13 U/L — ABNORMAL LOW (ref 15–41)
Albumin: 4.2 g/dL (ref 3.5–5.0)
Alkaline Phosphatase: 92 U/L (ref 38–126)
Anion gap: 7 (ref 5–15)
BUN: 19 mg/dL (ref 8–23)
CO2: 30 mmol/L (ref 22–32)
Calcium: 9.6 mg/dL (ref 8.9–10.3)
Chloride: 101 mmol/L (ref 98–111)
Creatinine: 1.16 mg/dL (ref 0.61–1.24)
GFR, Est AFR Am: >60 mL/min (ref >60)
GFR, Estimated: >60 mL/min (ref >60)
Glucose, Bld: 171 mg/dL — ABNORMAL HIGH (ref 70–99)
Potassium: 4.3 mmol/L (ref 3.5–5.1)
Sodium: 138 mmol/L (ref 135–145)
Total Bilirubin: 0.5 mg/dL (ref 0.3–1.2)
Total Protein: 6.7 g/dL (ref 6.5–8.1)

## 2019-12-08 LAB — CBC WITH DIFFERENTIAL (CANCER CENTER ONLY)
Abs Immature Granulocytes: 0.16 10*3/uL — ABNORMAL HIGH (ref 0.00–0.07)
Basophils Absolute: 0.1 10*3/uL (ref 0.0–0.1)
Basophils Relative: 1 %
Eosinophils Absolute: 0.3 10*3/uL (ref 0.0–0.5)
Eosinophils Relative: 2 %
HCT: 45.5 % (ref 39.0–52.0)
Hemoglobin: 15.2 g/dL (ref 13.0–17.0)
Immature Granulocytes: 1 %
Lymphocytes Relative: 23 %
Lymphs Abs: 3.4 10*3/uL (ref 0.7–4.0)
MCH: 31.2 pg (ref 26.0–34.0)
MCHC: 33.4 g/dL (ref 30.0–36.0)
MCV: 93.4 fL (ref 80.0–100.0)
Monocytes Absolute: 1.4 10*3/uL — ABNORMAL HIGH (ref 0.1–1.0)
Monocytes Relative: 9 %
Neutro Abs: 9.3 10*3/uL — ABNORMAL HIGH (ref 1.7–7.7)
Neutrophils Relative %: 64 %
Platelet Count: 219 10*3/uL (ref 150–400)
RBC: 4.87 MIL/uL (ref 4.22–5.81)
RDW: 13 % (ref 11.5–15.5)
WBC Count: 14.7 10*3/uL — ABNORMAL HIGH (ref 4.0–10.5)
nRBC: 0 % (ref 0.0–0.2)

## 2019-12-08 LAB — SEDIMENTATION RATE: Sed Rate: 3 mm/hr (ref 0–16)

## 2019-12-08 LAB — C-REACTIVE PROTEIN: CRP: 1.3 mg/dL — ABNORMAL HIGH (ref <1.0)

## 2019-12-08 LAB — LACTATE DEHYDROGENASE: LDH: 129 U/L (ref 98–192)

## 2019-12-08 NOTE — Progress Notes (Signed)
Hematology and Oncology Follow Up Visit  [name] [id number] 20-May-1946 74 y.o. 12/08/2019   Principle Diagnosis:  Chronic reactive leukocytosis - chronic arthritis and skin cancer, flow cytometry was negative  Current Therapy:   Observation   Interim History:  [name] is here today for follow-up.  He is doing pretty well.  We saw him 6 months ago.  He is on Creon which is helping his abdominal issues.  He gets this from the [place] hospital.  He was in the [employer].  As such, he is a true [employer] hero.  He lives in [place].  He was down in [place], [place] but really did not like it down there.  He has had no fever.  He has had no problems with cough.  Has had some sinus issues.  He says whenever he has a sinus problem, he has some urinary frequency.  There has been no rashes.  He has had no bleeding.  He has not lost any weight.  He does try to walk.  Overall, his performance status is ECOG 0.  Medications:  Allergies as of 12/08/2019      Reactions   Codeine Nausea Only      Medication List       Accurate as of December 08, 2019  2:12 PM. If you have any questions, ask your nurse or doctor.        amitriptyline 50 MG tablet Commonly known as: ELAVIL Take 1 tablet (50 mg total) by mouth at bedtime.   aspirin 81 MG EC tablet Take 1 tablet (81 mg total) by mouth daily.   B-12 IJ Inject as directed. Every other week for 2 months and then every month afterward   Vitamin B-12 1000 MCG Subl PLACE 1 TABLET (1,000 MCG TOTAL) UNDER THE TONGUE DAILY.   fluticasone 50 MCG/ACT nasal spray Commonly known as: FLONASE Place 2 sprays into both nostrils daily.   lipase/protease/amylase 36000 UNITS Cpep capsule Commonly known as: Creon Take 2 capsules (72,000 Units total) by mouth 3 (three) times daily before meals.   meclizine 12.5 MG tablet Commonly known as: ANTIVERT Take 1-2 tablets (12.5-25 mg total) by mouth every 6 (six) hours as needed for dizziness.     meloxicam 15 MG tablet Commonly known as: MOBIC Take 15 mg by mouth daily as needed.   pantoprazole 40 MG tablet Commonly known as: PROTONIX TAKE 1 TABLET BY MOUTH EVERY DAY What changed: when to take this   pantoprazole 40 MG tablet Commonly known as: PROTONIX Take 1 tablet (40 mg total) by mouth 2 (two) times daily. What changed: Another medication with the same name was changed. Make sure you understand how and when to take each.   [facility] Colon Health Caps Take 1 capsule by mouth daily.   rifaximin 550 MG Tabs tablet Commonly known as: XIFAXAN Take 1 tablet (550 mg total) by mouth 3 (three) times daily. For two weeks.   sodium chloride 0.65 % Soln nasal spray Commonly known as: OCEAN Place 2 sprays into both nostrils at bedtime.       Allergies:  Allergies  Allergen Reactions  . Codeine Nausea Only    Past Medical History, Surgical history, Social history, and Family History were reviewed and updated.  Review of Systems: Review of Systems  Constitutional: Negative.   HENT: Positive for sinus pain.   Eyes: Negative.   Respiratory: Negative.   Cardiovascular: Negative.   Gastrointestinal: Positive for abdominal pain.  Genitourinary: Positive for frequency.  Musculoskeletal: Negative.   Skin: Negative.   Neurological: Negative.   Endo/Heme/Allergies: Negative.   Psychiatric/Behavioral: Negative.      Physical Exam:  weight is 208 lb (94.3 kg). His oral temperature is 98.9 F (37.2 C). His blood pressure is 126/59 (abnormal) and his pulse is 75. His respiration is 18 and oxygen saturation is 97%.   Wt Readings from Last 3 Encounters:  12/08/19 208 lb (94.3 kg)  06/09/19 207 lb (93.9 kg)  03/11/19 205 lb 8 oz (93.2 kg)    Physical Exam Vitals reviewed.  HENT:     Head: Normocephalic and atraumatic.  Eyes:     Pupils: Pupils are equal, round, and reactive to light.  Cardiovascular:     Rate and Rhythm: Normal rate and regular rhythm.     Heart  sounds: Normal heart sounds.  Pulmonary:     Effort: Pulmonary effort is normal.     Breath sounds: Normal breath sounds.  Abdominal:     General: Bowel sounds are normal.     Palpations: Abdomen is soft.  Musculoskeletal:        General: No tenderness or deformity. Normal range of motion.     Cervical back: Normal range of motion.  Lymphadenopathy:     Cervical: No cervical adenopathy.  Skin:    General: Skin is warm and dry.     Findings: No erythema or rash.  Neurological:     Mental Status: He is alert and oriented to person, place, and time.  Psychiatric:        Behavior: Behavior normal.        Thought Content: Thought content normal.        Judgment: Judgment normal.       Lab Results  Component Value Date   WBC 14.7 (H) 12/08/2019   HGB 15.2 12/08/2019   HCT 45.5 12/08/2019   MCV 93.4 12/08/2019   PLT 219 12/08/2019   Lab Results  Component Value Date   FERRITIN 244.0 11/06/2009   IRON 64 11/06/2009   IRONPCTSAT 20.9 11/06/2009   Lab Results  Component Value Date   RBC 4.87 12/08/2019   No results found for: KPAFRELGTCHN, LAMBDASER, [facility] Lab Results  Component Value Date   IGA 212 11/06/2009   No results found for: Odetta Pink, SPEI   Chemistry      Component Value Date/Time   NA 138 12/08/2019 1306   K 4.3 12/08/2019 1306   CL 101 12/08/2019 1306   CO2 30 12/08/2019 1306   BUN 19 12/08/2019 1306   CREATININE 1.16 12/08/2019 1306      Component Value Date/Time   CALCIUM 9.6 12/08/2019 1306   ALKPHOS 92 12/08/2019 1306   AST 13 (L) 12/08/2019 1306   ALT 14 12/08/2019 1306   BILITOT 0.5 12/08/2019 1306       Impression and Plan: [name] is a very pleasant 74 yo caucasian gentleman with chronic reactive leukocytosis.   I looked at his smear under the microscope.  I do not see anything with his neutrophils.  His neutrophils were quite mature.  There were no hypersegmented  polys.  There are no immature myeloid or lymphoid cells.  I really think that we can get her back in a year.  His white cell count really has not changed in a year.  I suspect that this is going to be a chronic issue and that this is nothing that reflects any bone marrow hyperactivity.  Volanda Napoleon, MD 6/10/20212:12 PM

## 2019-12-19 ENCOUNTER — Other Ambulatory Visit: Payer: Self-pay

## 2019-12-19 ENCOUNTER — Ambulatory Visit (AMBULATORY_SURGERY_CENTER): Payer: Self-pay | Admitting: *Deleted

## 2019-12-19 VITALS — Ht 68.0 in | Wt 204.0 lb

## 2019-12-19 DIAGNOSIS — Z01818 Encounter for other preprocedural examination: Secondary | ICD-10-CM

## 2019-12-19 DIAGNOSIS — Z1211 Encounter for screening for malignant neoplasm of colon: Secondary | ICD-10-CM

## 2019-12-19 MED ORDER — NA SULFATE-K SULFATE-MG SULF 17.5-3.13-1.6 GM/177ML PO SOLN: 1.0000 | Freq: Once | ORAL | 0 refills | Status: AC

## 2019-12-19 NOTE — Progress Notes (Signed)

## 2019-12-30 ENCOUNTER — Other Ambulatory Visit: Payer: Self-pay | Admitting: Gastroenterology

## 2019-12-30 ENCOUNTER — Ambulatory Visit (INDEPENDENT_AMBULATORY_CARE_PROVIDER_SITE_OTHER): Payer: Medicare Other

## 2019-12-30 DIAGNOSIS — Z1159 Encounter for screening for other viral diseases: Secondary | ICD-10-CM | POA: Diagnosis not present

## 2019-12-31 LAB — SARS CORONAVIRUS 2 (TAT 6-24 HRS): SARS Coronavirus 2: NEGATIVE

## 2020-01-04 ENCOUNTER — Other Ambulatory Visit: Payer: Self-pay

## 2020-01-04 ENCOUNTER — Ambulatory Visit (AMBULATORY_SURGERY_CENTER): Payer: Medicare Other | Admitting: Gastroenterology

## 2020-01-04 ENCOUNTER — Encounter: Payer: Self-pay | Admitting: Gastroenterology

## 2020-01-04 VITALS — BP 124/76 | HR 69 | Temp 97.8°F | Resp 22 | Ht 68.0 in | Wt 204.0 lb

## 2020-01-04 DIAGNOSIS — K621 Rectal polyp: Secondary | ICD-10-CM

## 2020-01-04 DIAGNOSIS — D124 Benign neoplasm of descending colon: Secondary | ICD-10-CM

## 2020-01-04 DIAGNOSIS — D123 Benign neoplasm of transverse colon: Secondary | ICD-10-CM

## 2020-01-04 DIAGNOSIS — D120 Benign neoplasm of cecum: Secondary | ICD-10-CM

## 2020-01-04 DIAGNOSIS — D122 Benign neoplasm of ascending colon: Secondary | ICD-10-CM

## 2020-01-04 DIAGNOSIS — Z1211 Encounter for screening for malignant neoplasm of colon: Secondary | ICD-10-CM

## 2020-01-04 DIAGNOSIS — D125 Benign neoplasm of sigmoid colon: Secondary | ICD-10-CM

## 2020-01-04 DIAGNOSIS — D128 Benign neoplasm of rectum: Secondary | ICD-10-CM

## 2020-01-04 DIAGNOSIS — K5730 Diverticulosis of large intestine without perforation or abscess without bleeding: Secondary | ICD-10-CM

## 2020-01-04 DIAGNOSIS — K640 First degree hemorrhoids: Secondary | ICD-10-CM

## 2020-01-04 MED ORDER — SODIUM CHLORIDE 0.9 % IV SOLN: 500.0000 mL | INTRAVENOUS | Status: DC

## 2020-01-04 NOTE — Patient Instructions (Signed)
Please read handouts provided. Continue present medications. Await pathology results. Return to GI clinic as needed. Use a fiber supplement, suggestions on back page of report.     YOU HAD AN ENDOSCOPIC PROCEDURE TODAY AT [facility]:   Refer to the procedure report that was given to you for any specific questions about what was found during the examination.  If the procedure report does not answer your questions, please call your gastroenterologist to clarify.  If you requested that your care partner not be given the details of your procedure findings, then the procedure report has been included in a sealed envelope for you to review at your convenience later.  YOU SHOULD EXPECT: Some feelings of bloating in the abdomen. Passage of more gas than usual.  Walking can help get rid of the air that was put into your GI tract during the procedure and reduce the bloating. If you had a lower endoscopy (such as a colonoscopy or flexible sigmoidoscopy) you may notice spotting of blood in your stool or on the toilet paper. If you underwent a bowel prep for your procedure, you may not have a normal bowel movement for a few days.  Please Note:  You might notice some irritation and congestion in your nose or some drainage.  This is from the oxygen used during your procedure.  There is no need for concern and it should clear up in a day or so.  SYMPTOMS TO REPORT IMMEDIATELY:   Following lower endoscopy (colonoscopy or flexible sigmoidoscopy):  Excessive amounts of blood in the stool  Significant tenderness or worsening of abdominal pains  Swelling of the abdomen that is new, acute  Fever of 100F or higher    For urgent or emergent issues, a gastroenterologist can be reached at any hour by calling [phone]. Do not use MyChart messaging for urgent concerns.    DIET:  We do recommend a small meal at first, but then you may proceed to your regular diet.  Drink plenty of fluids  but you should avoid alcoholic beverages for 24 hours.  ACTIVITY:  You should plan to take it easy for the rest of today and you should NOT DRIVE or use heavy machinery until tomorrow (because of the sedation medicines used during the test).    FOLLOW UP: Our staff will call the number listed on your records 48-72 hours following your procedure to check on you and address any questions or concerns that you may have regarding the information given to you following your procedure. If we do not reach you, we will leave a message.  We will attempt to reach you two times.  During this call, we will ask if you have developed any symptoms of COVID 19. If you develop any symptoms (ie: fever, flu-like symptoms, shortness of breath, cough etc.) before then, please call [phone].  If you test positive for Covid 19 in the 2 weeks post procedure, please call and report this information to [place].    If any biopsies were taken you will be contacted by phone or by letter within the next 1-3 weeks.  Please call us at [phone] if you have not heard about the biopsies in 3 weeks.    SIGNATURES/CONFIDENTIALITY: You and/or your care partner have signed paperwork which will be entered into your electronic medical record.  These signatures attest to the fact that that the information above on your After Visit Summary has been reviewed and is understood.  Full responsibility  of the confidentiality of this discharge information lies with you and/or your care-partner.

## 2020-01-04 NOTE — Progress Notes (Signed)
Vs CW I have reviewed the patient's medical history in detail and updated the computerized patient record.   

## 2020-01-04 NOTE — Progress Notes (Signed)
PT taken to PACU. Monitors in place. VSS. Report given to RN. 

## 2020-01-04 NOTE — Progress Notes (Signed)
Called to room to assist during endoscopic procedure.  Patient ID and intended procedure confirmed with present staff. Received instructions for my participation in the procedure from the performing physician.  

## 2020-01-04 NOTE — Op Note (Signed)
[facility] Patient Name: Kaimen Peine Procedure Date: 01/04/2020 12:03 PM MRN: [id number] Endoscopist: Gerrit Heck , MD Age: 74 Referring MD:  Date of Birth: 1946/03/31 Gender: Male Account #: [id number] Procedure:                Colonoscopy Indications:              Screening for colorectal malignant neoplasm (last                            colonoscopy was 10 years ago) Medicines:                Monitored Anesthesia Care Procedure:                Pre-Anesthesia Assessment:                           - Prior to the procedure, a History and Physical                            was performed, and patient medications and                            allergies were reviewed. The patient's tolerance of                            previous anesthesia was also reviewed. The risks                            and benefits of the procedure and the sedation                            options and risks were discussed with the patient.                            All questions were answered, and informed consent                            was obtained. Prior Anticoagulants: The patient has                            taken no previous anticoagulant or antiplatelet                            agents. ASA Grade Assessment: II - A patient with                            mild systemic disease. After reviewing the risks                            and benefits, the patient was deemed in                            satisfactory condition to undergo the procedure.  After obtaining informed consent, the colonoscope                            was passed under direct vision. Throughout the                            procedure, the patient's blood pressure, pulse, and                            oxygen saturations were monitored continuously. The                            Colonoscope was introduced through the anus and                            advanced to the the cecum,  identified by                            appendiceal orifice and ileocecal valve. The                            colonoscopy was performed without difficulty. The                            patient tolerated the procedure well. The quality                            of the bowel preparation was good. The ileocecal                            valve, appendiceal orifice, and rectum were                            photographed. Scope In: 12:21:44 PM Scope Out: 12:42:54 PM Scope Withdrawal Time: 0 hours 15 minutes 33 seconds  Total Procedure Duration: 0 hours 21 minutes 10 seconds  Findings:                 The perianal and digital rectal examinations were                            normal.                           Six sessile polyps were found in the descending                            colon (2), transverse colon (2), ascending colon,                            and cecum. The polyps were 3 to 6 mm in size. These                            polyps were removed with a cold snare. Resection  and retrieval were complete. Estimated blood loss                            was minimal.                           Four sessile polyps were found in the rectum (2)                            and sigmoid colon (2). The polyps were 2 to 3 mm in                            size. These polyps were removed with a cold snare.                            Resection and retrieval were complete. Estimated                            blood loss was minimal.                           Multiple small and large-mouthed diverticula were                            found in the sigmoid colon, transverse colon and                            ascending colon.                           Non-bleeding internal hemorrhoids were found during                            retroflexion. The hemorrhoids were small. Complications:            No immediate complications. Estimated Blood Loss:     Estimated blood  loss was minimal. Impression:               - Six 3 to 6 mm polyps in the descending colon, in                            the transverse colon, in the ascending colon and in                            the cecum, removed with a cold snare. Resected and                            retrieved.                           - Four 2 to 3 mm polyps in the rectum and in the                            sigmoid colon, removed with a cold snare. Resected  and retrieved.                           - Diverticulosis in the sigmoid colon, in the                            transverse colon and in the ascending colon.                           - Non-bleeding internal hemorrhoids. Recommendation:           - Patient has a contact number available for                            emergencies. The signs and symptoms of potential                            delayed complications were discussed with the                            patient. Return to normal activities tomorrow.                            Written discharge instructions were provided to the                            patient.                           - Resume previous diet.                           - Continue present medications.                           - Await pathology results.                           - Repeat colonoscopy for surveillance based on                            pathology results.                           - Return to GI clinic PRN.                           - Use fiber, for example Citrucel, Fibercon, Konsyl                            or Metamucil. Gerrit Heck, MD 01/04/2020 12:49:17 PM

## 2020-01-06 ENCOUNTER — Telehealth: Payer: Self-pay

## 2020-01-06 NOTE — Telephone Encounter (Signed)
LVM

## 2020-01-06 NOTE — Telephone Encounter (Signed)
Left message on follow up call. 

## 2020-01-13 ENCOUNTER — Encounter: Payer: Self-pay | Admitting: Gastroenterology

## 2020-01-18 DIAGNOSIS — L570 Actinic keratosis: Secondary | ICD-10-CM | POA: Diagnosis not present

## 2020-01-18 DIAGNOSIS — L820 Inflamed seborrheic keratosis: Secondary | ICD-10-CM | POA: Diagnosis not present

## 2020-01-24 ENCOUNTER — Encounter: Payer: Self-pay | Admitting: Gastroenterology

## 2020-01-29 IMAGING — RF ESOPHAGUS/BARIUM SWALLOW/TABLET STUDY
9 of 12 series · 14 of 21 positions shown · non-contrast
Comparison: None.

CLINICAL DATA: 72-year-old male with history of hiatal hernia with
reflux.

EXAM:
ESOPHOGRAM / BARIUM SWALLOW / BARIUM TABLET STUDY
TECHNIQUE: Combined double contrast and single contrast examination performed
using effervescent crystals, thick barium liquid, and thin barium
liquid. The patient was observed with fluoroscopy swallowing a 13 mm
barium sulphate tablet.
FLUOROSCOPY TIME:  Fluoroscopy Time:  1 minutes and 20 seconds
Radiation Exposure Index (if provided by the fluoroscopic device):
11.7 mGy

[Series 1: fluoro_barium 2fps_bw · 0.18mm/px · 1 of 1 slices shown (1 of 5)]
[im 1/1]
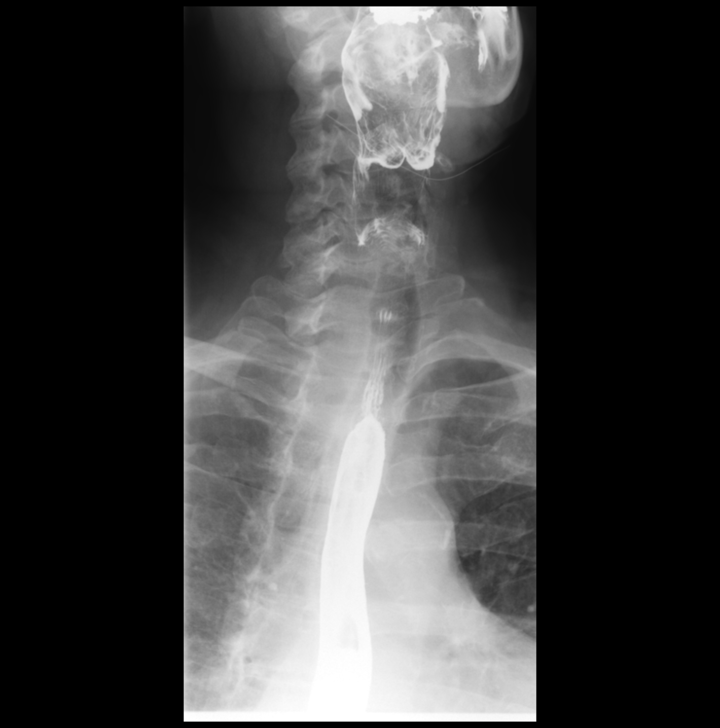

[Series 3: fluoro_barium 2fps_bw · 0.18mm/px · 1 of 1 slices shown (2 of 5)]
[im 1/1]
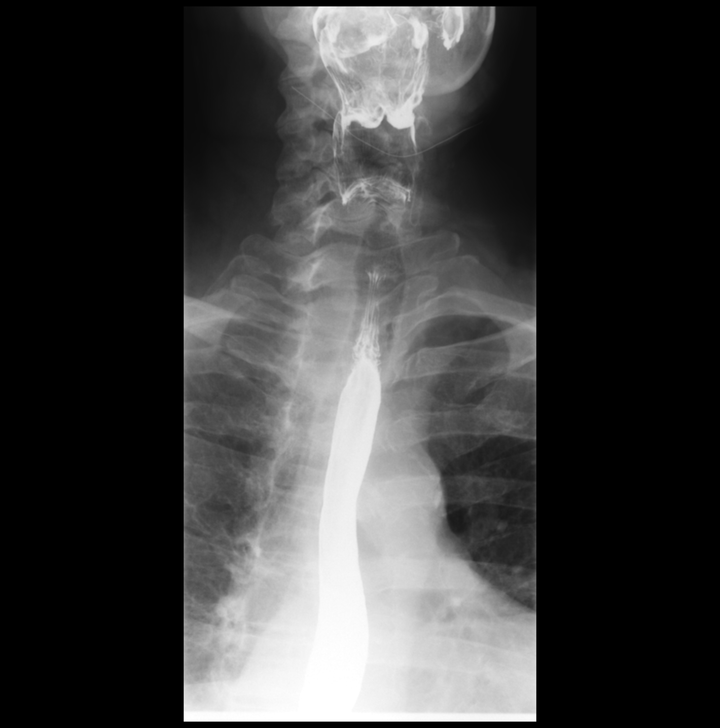

[Series 4: fluoro_barium 2fps_bw · 0.18mm/px · 1 of 1 slices shown (3 of 5)]
[im 1/1]
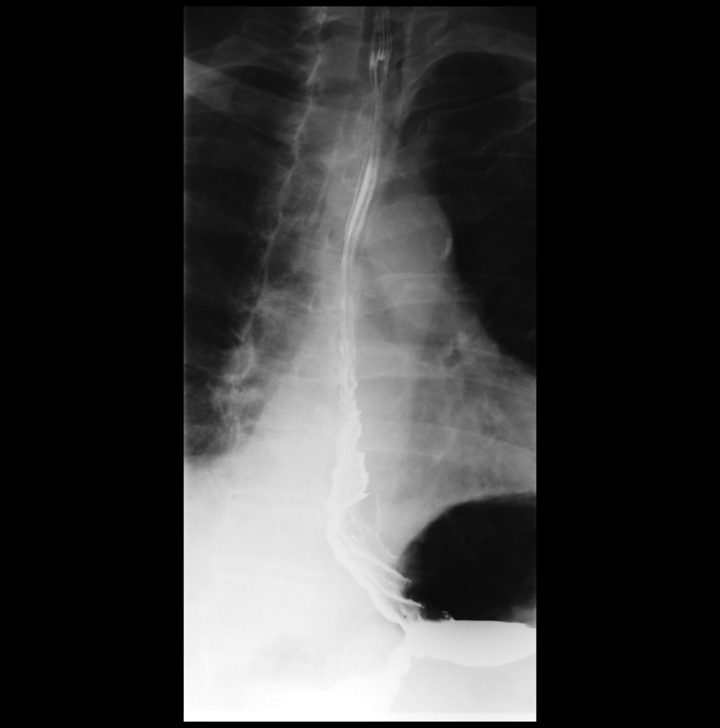

[Series 5: cp_standard · 0.53mm/px · 2 of 22 frames shown (1 of 4)]
[frame 12/22]
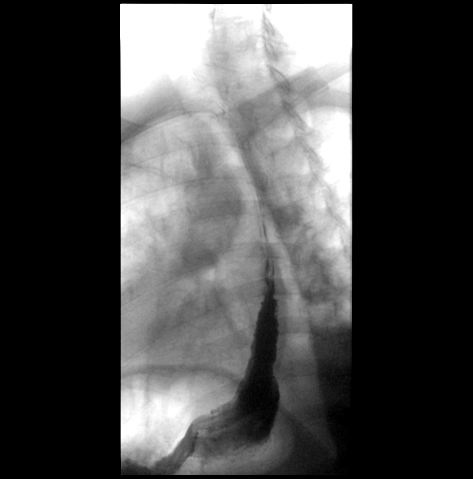
[frame 19/22]
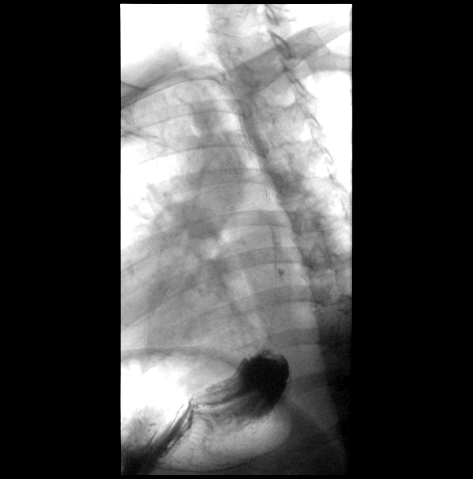

[Series 6: cp_standard · 0.53mm/px · 3 of 26 frames shown (2 of 4)]
[frame 4/26]
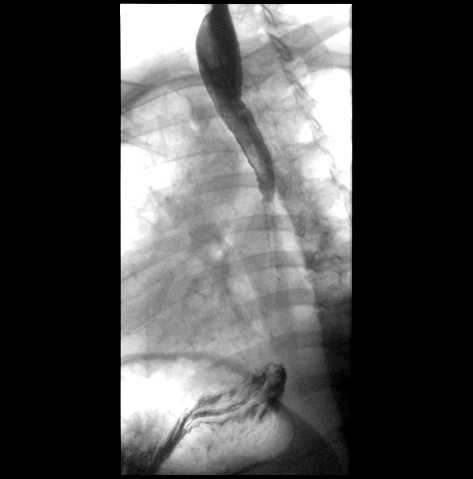
[frame 14/26]
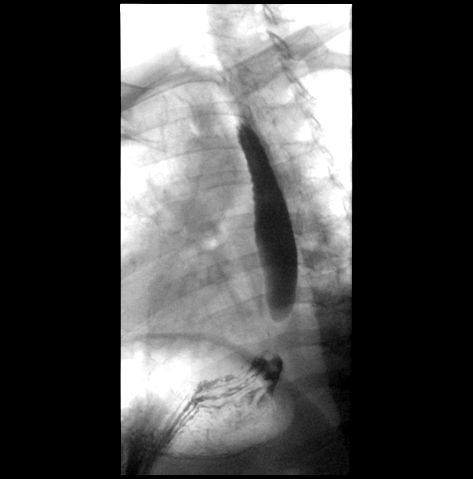
[frame 25/26]
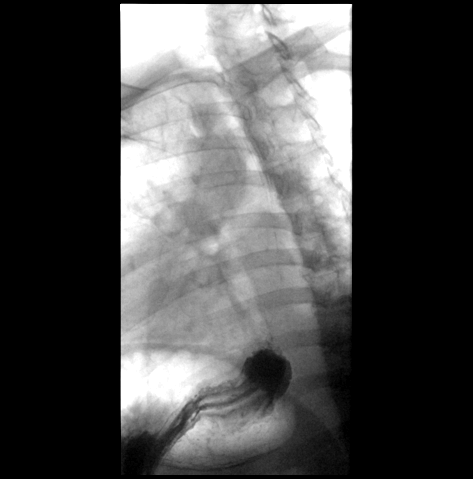

[Series 7: cp_standard · 0.53mm/px · 3 of 28 frames shown (3 of 4)]
[frame 5/28]
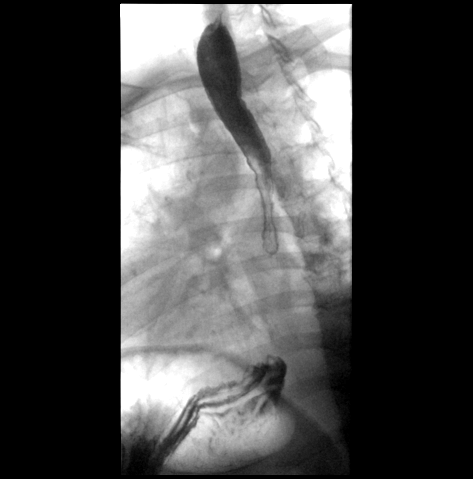
[frame 24/28]
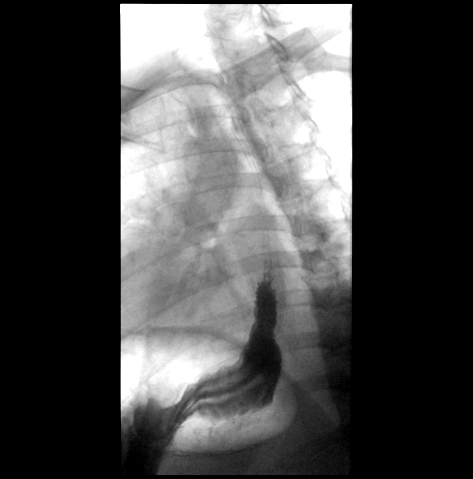
[frame 26/28]
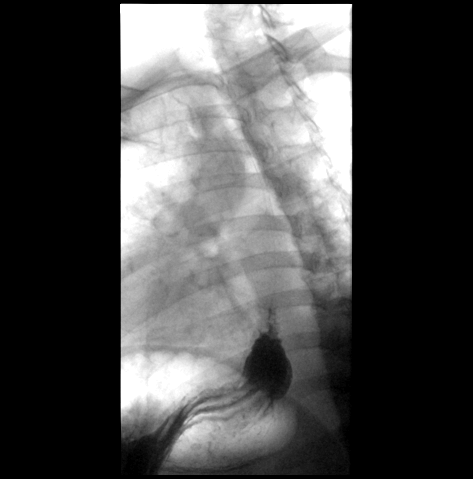

[Series 9: fluoro_barium 2fps_bw · 0.18mm/px · 1 of 1 slices shown (4 of 5)]
[im 1/1]
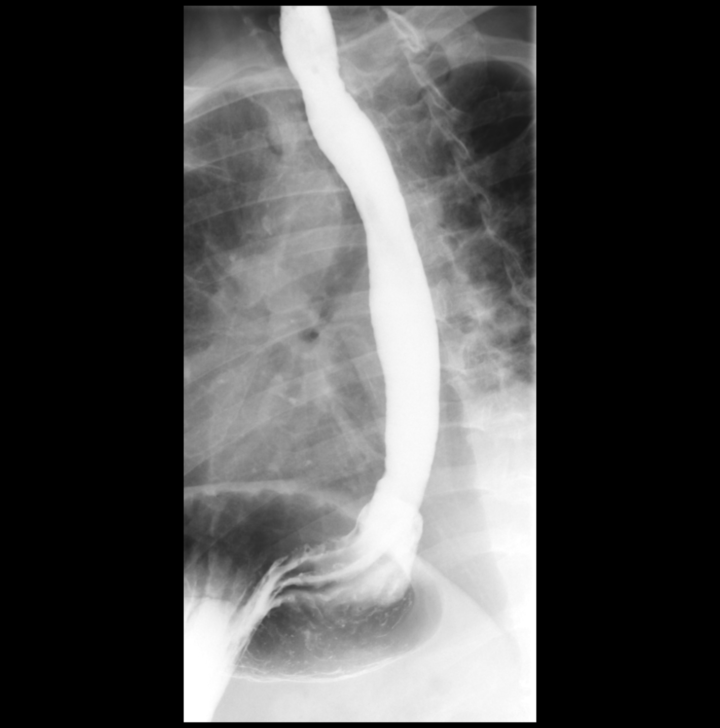

[Series 10: fluoro_barium 2fps_bw · 0.18mm/px · 1 of 1 slices shown (5 of 5)]
[im 1/1]
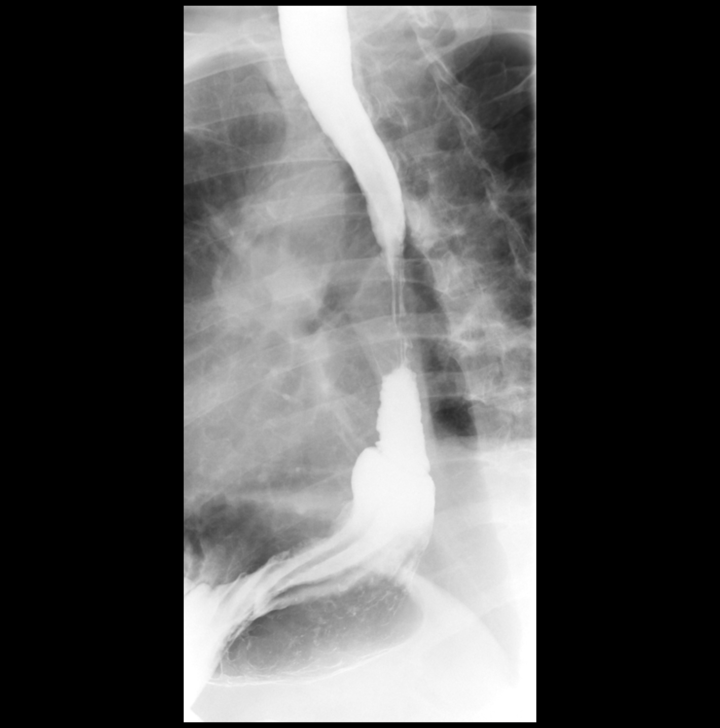

[Series 12: cp_standard · 0.25mm/px · 1 of 1 slices shown (4 of 4)]
[im 1/1]
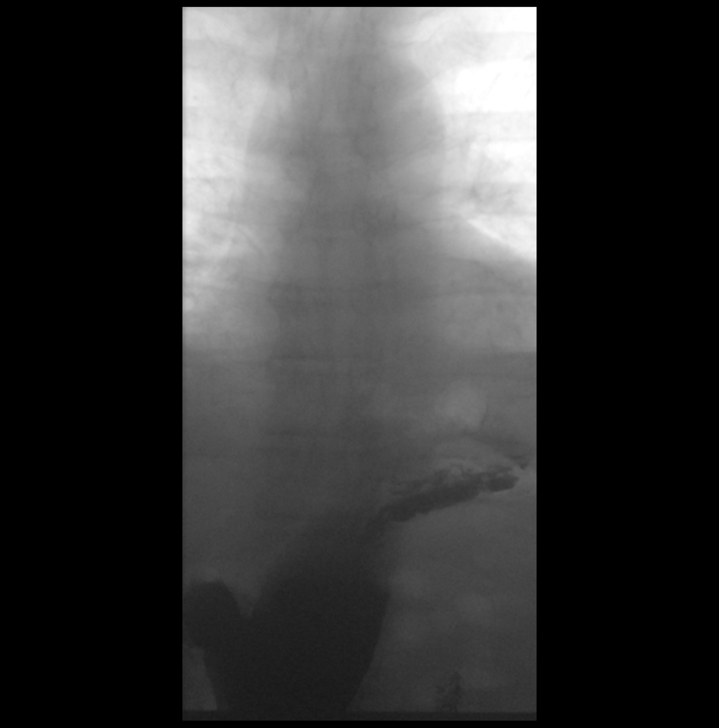

[14 of 21 positions shown; findings below may reference images not displayed]

FINDINGS: Double contrast images of the esophagus demonstrate normal
esophageal mucosa. Single swallow attempts demonstrated normal
propagation of primary peristaltic waves. However, extensive
tertiary contractions were evident. Full column esophagram
demonstrated no esophageal mass, stricture or esophageal ring. Small
hiatal hernia. Water siphon test demonstrated no gastroesophageal
reflux during observation. A barium tablet was administered which
passed readily into the stomach.
IMPRESSION: 1. Extensive tertiary contractions. Despite this, there was normal
propagation of primary peristaltic waves.
2. Small hiatal hernia.
3. Otherwise, normal esophagram, as above.

## 2020-07-23 DIAGNOSIS — M6208 Separation of muscle (nontraumatic), other site: Secondary | ICD-10-CM | POA: Diagnosis not present

## 2020-07-23 DIAGNOSIS — K4090 Unilateral inguinal hernia, without obstruction or gangrene, not specified as recurrent: Secondary | ICD-10-CM | POA: Diagnosis not present

## 2020-08-17 NOTE — Progress Notes (Signed)
Subjective:   [name] is a 75 y.o. male who presents for an Initial Medicare Annual Wellness Visit.  I connected with Sydell Axon today by telephone and verified that I am speaking with the correct person using two identifiers. Location patient: home Location provider: work Persons participating in the virtual visit: patient, provider.   I discussed the limitations, risks, security and privacy concerns of performing an evaluation and management service by telephone and the availability of in person appointments. I also discussed with the patient that there may be a patient responsible charge related to this service. The patient expressed understanding and verbally consented to this telephonic visit.    Interactive audio and video telecommunications were attempted between this provider and patient, however failed, due to patient having technical difficulties OR patient did not have access to video capability.  We continued and completed visit with audio only.      Review of Systems    N/A  Cardiac Risk Factors include: male gender;advanced age (>67men, >51 women)     Objective:    Today's Vitals   08/20/20 1036  PainSc: 4    There is no height or weight on file to calculate BMI.  Advanced Directives 08/20/2020 12/08/2019 06/09/2019 03/11/2019 02/27/2014 01/11/2013  Does Patient Have a Medical Advance Directive? No No No No No Patient does not have advance directive  Would patient like information on creating a medical advance directive? No - Patient declined No - Patient declined - No - Patient declined No - patient declined information -    Current Medications (verified) Outpatient Encounter Medications as of 08/20/2020  Medication Sig   amitriptyline (ELAVIL) 50 MG tablet Take 1 tablet (50 mg total) by mouth at bedtime. (Patient taking differently: Take 75 mg by mouth at bedtime.)   aspirin EC 81 MG EC tablet Take 1 tablet (81 mg total) by mouth daily.   atorvastatin  (LIPITOR) 80 MG tablet Take 80 mg by mouth daily.   Cyanocobalamin (VITAMIN B-12) 1000 MCG SUBL PLACE 1 TABLET (1,000 MCG TOTAL) UNDER THE TONGUE DAILY.   lisinopril (ZESTRIL) 2.5 MG tablet Take 2.5 mg by mouth daily.   meclizine (ANTIVERT) 12.5 MG tablet Take 1-2 tablets (12.5-25 mg total) by mouth every 6 (six) hours as needed for dizziness.   niacinamide 500 MG tablet Take 500 mg by mouth daily.   fluticasone (FLONASE) 50 MCG/ACT nasal spray Place 2 sprays into both nostrils daily. (Patient not taking: No sig reported)   lipase/protease/amylase (CREON) 36000 UNITS CPEP capsule Take 2 capsules (72,000 Units total) by mouth 3 (three) times daily before meals. (Patient not taking: No sig reported)   pantoprazole (PROTONIX) 40 MG tablet TAKE 1 TABLET BY MOUTH EVERY DAY (Patient not taking: Reported on 08/20/2020)   [DISCONTINUED] Cyanocobalamin (B-12 IJ) Inject as directed. Every other week for 2 months and then every month afterward (Patient not taking: Reported on 01/04/2020)   [DISCONTINUED] Probiotic Product (Enon) CAPS Take 1 capsule by mouth daily.  (Patient not taking: Reported on 01/04/2020)   No facility-administered encounter medications on file as of 08/20/2020.    Allergies (verified) Codeine   History: Past Medical History:  Diagnosis Date   ALLERGIC RHINITIS CAUSE UNSPECIFIED 05/27/2010   Arthritis    BACK PAIN, UPPER 02/21/2010   Blood transfusion without reported diagnosis    with knee replacement   Cervicalgia 05/27/2010   CHEST PAIN UNSPECIFIED 04/25/2009   DIVERTICULOSIS, COLON 03/09/2007   DIZZINESS, CHRONIC 03/09/2007  GERD 12/24/2006   HEADACHE, SINUS 04/06/2009   History of colon polyps    HYPERLIPIDEMIA 12/24/2006   diet controlled   HYPERTENSION 12/24/2006   better after weight loss   INSOMNIA WITH SLEEP APNEA UNSPECIFIED 05/27/2010   Leukocytosis 12/08/2019   Motion sickness    Neuromuscular disorder (HCC)    tremor    Neuropathy    Open wound of wrist, complicated 7/61/6073   OSTEOARTHRITIS 12/24/2006   Other constipation 11/06/2009   Pneumonia    PONV (postoperative nausea and vomiting)    PROSTATITIS, RECURRENT 12/30/2007   SHOULDER PAIN, BILATERAL 07/16/2007   Sleep apnea    Squamous cell carcinoma, ear, left    TRANSIENT ISCHEMIC ATTACKS, HX OF 04/06/2009   Past Surgical History:  Procedure Laterality Date   ANKLE ARTHROSCOPY W/ OPEN REPAIR  2002   Repair   COLONOSCOPY     COLONOSCOPY     around 2017. [place]    CYST EXCISION     from back 2015. likely this was meaning of ear cyst   EAR CYST EXCISION Bilateral 01/18/2013   Procedure: EXCISION CYSTS FROM BACK (TWO);  Surgeon: Earnstine Regal, MD;  Location: [facility];  Service: General;  Laterality: Bilateral;   epidydmal cyst excision     ESOPHAGOGASTRODUODENOSCOPY     [place] around 2017   REPLACEMENT TOTAL KNEE BILATERAL  2005/2007   SKIN CANCER EXCISION  2020   ear, squamous cell   TONSILLECTOMY     Family History  Problem Relation Age of Onset   Achalasia Father    Depression Father    Heart disease Father    Heart disease Brother 16       heavy smoker-died of MI   Kidney failure Mother 34   Breast cancer Mother    Diabetes Sister    Colon cancer Neg Hx    Esophageal cancer Neg Hx    Colon polyps Neg Hx    Stomach cancer Neg Hx    Social History   Socioeconomic History   Marital status: Married    Spouse name: Mardene Celeste   Number of children: 1   Years of education: 12   Highest education level: Not on file  Occupational History   Occupation: Retired    Fish farm manager: RETIRED    Comment: [employer], Librarian, academic (1996)  Tobacco Use   Smoking status: Never Smoker   Smokeless tobacco: Never Used   Tobacco comment: second hand smoke  Vaping Use   Vaping Use: Never used  Substance and Sexual Activity   Alcohol use: No    Comment: quit 45 yrs ago   Drug use: No   Sexual  activity: Yes  Other Topics Concern   Not on file  Social History Narrative   Married 1967. 1 girl. 2 grandkids.       Retired Conservation officer, historic buildings      Hobbies: Photographer, lake, boat   Caffeine 1 daily         Social Determinants of Health   Financial Resource Strain: Low Risk    Difficulty of Paying Living Expenses: Not hard at all  Food Insecurity: No Food Insecurity   Worried About Charity fundraiser in the Last Year: Never true   Arboriculturist in the Last Year: Never true  Transportation Needs: No Transportation Needs   Lack of Transportation (Medical): No   Lack of Transportation (Non-Medical): No  Physical Activity: Inactive   Days of Exercise per Week:  0 days   Minutes of Exercise per Session: 0 min  Stress: No Stress Concern Present   Feeling of Stress : Not at all  Social Connections: Moderately Integrated   Frequency of Communication with Friends and Family: More than three times a week   Frequency of Social Gatherings with Friends and Family: Once a week   Attends Religious Services: More than 4 times per year   Active Member of [employer] or Organizations: No   Attends Music therapist: Never   Marital Status: Married    Tobacco Counseling Counseling given: Not Answered Comment: second hand smoke   Clinical Intake:  Pre-visit preparation completed: Yes  Pain : 0-10 Pain Score: 4  Pain Type: Chronic pain Pain Location: Shoulder Pain Orientation: Left Pain Descriptors / Indicators: Aching Pain Onset: More than a month ago Pain Relieving Factors: Meloxicam  Pain Relieving Factors: Meloxicam  Nutritional Risks: None Diabetes: No  How often do you need to have someone help you when you read instructions, pamphlets, or other written materials from your doctor or pharmacy?: 1 - Never What is the last grade level you completed in school?: High School  Diabetic?No  Interpreter Needed?: No  Information entered by  :: [place] of Daily Living In your present state of health, do you have any difficulty performing the following activities: 08/20/2020  Hearing? N  Vision? N  Difficulty concentrating or making decisions? N  Walking or climbing stairs? N  Dressing or bathing? N  Doing errands, shopping? N  Preparing Food and eating ? N  Using the Toilet? N  In the past six months, have you accidently leaked urine? N  Do you have problems with loss of bowel control? N  Managing your Medications? N  Managing your Finances? N  Housekeeping or managing your Housekeeping? N  Some recent data might be hidden    Patient Care Team: Caren Macadam, MD as PCP - General (Family Medicine) Zonia Kief, MD (Rehabilitation)  Indicate any recent Medical Services you may have received from other than [facility] providers in the past year (date may be approximate).     Assessment:   This is a routine wellness examination for [name].  Hearing/Vision screen  Hearing Screening   125Hz  250Hz  500Hz  1000Hz  2000Hz  3000Hz  4000Hz  6000Hz  8000Hz   Right ear:           Left ear:           Vision Screening Comments: States gets eyes examined every 2-3 years had eye exam in 2021   Dietary issues and exercise activities discussed: Current Exercise Habits: The patient does not participate in regular exercise at present  Goals     Exercise 150 min/wk Moderate Activity     Weight (lb) < 190 lb (86.2 kg)      Depression Screen PHQ 2/9 Scores 08/20/2020 05/07/2018 07/05/2014 12/02/2011  PHQ - 2 Score 0 0 0 2    Fall Risk Fall Risk  08/20/2020 08/11/2018 07/05/2014  Falls in the past year? 0 1 No  Number falls in past yr: 0 0 -  Injury with Fall? 0 1 -  Comment - shoulder injury -  Risk for fall due to : No Fall Risks Other (Comment) -  Risk for fall due to: Comment - fell in dark on steps -  Follow up Falls evaluation completed;Falls prevention discussed - -    FALL RISK PREVENTION PERTAINING TO THE  HOME:  Any stairs in or around the  home? Yes  If so, are there any without handrails? No  Home free of loose throw rugs in walkways, pet beds, electrical cords, etc? Yes  Adequate lighting in your home to reduce risk of falls? Yes   ASSISTIVE DEVICES UTILIZED TO PREVENT FALLS:  Life alert? No  Use of a cane, walker or w/c? No  Grab bars in the bathroom? Yes  Shower chair or bench in shower? Yes  Elevated toilet seat or a handicapped toilet? No     Cognitive Function:   Normal cognitive status assessed by direct observation by this Nurse Health Advisor. No abnormalities found.        Immunizations Immunization History  Administered Date(s) Administered   Influenza Split 04/30/2011   Influenza Whole 04/04/2008, 04/06/2009   Influenza, High Dose Seasonal PF 05/07/2018   Influenza,inj,Quad PF,6+ Mos 04/10/2014   Pneumococcal Conjugate-13 05/07/2018   Td 06/30/1997, 12/07/2008   Tdap 10/08/2007    TDAP status: Due, Education has been provided regarding the importance of this vaccine. Advised may receive this vaccine at local pharmacy or Health Dept. Aware to provide a copy of the vaccination record if obtained from local pharmacy or Health Dept. Verbalized acceptance and understanding.  Flu Vaccine status: Due, Education has been provided regarding the importance of this vaccine. Advised may receive this vaccine at local pharmacy or Health Dept. Aware to provide a copy of the vaccination record if obtained from local pharmacy or Health Dept. Verbalized acceptance and understanding.  Pneumococcal vaccine status: Due, Education has been provided regarding the importance of this vaccine. Advised may receive this vaccine at local pharmacy or Health Dept. Aware to provide a copy of the vaccination record if obtained from local pharmacy or Health Dept. Verbalized acceptance and understanding.  Covid-19 vaccine status: Declined, Education has been provided regarding the importance  of this vaccine but patient still declined. Advised may receive this vaccine at local pharmacy or Health Dept.or vaccine clinic. Aware to provide a copy of the vaccination record if obtained from local pharmacy or Health Dept. Verbalized acceptance and understanding.  Qualifies for Shingles Vaccine? Yes   Zostavax completed No   Shingrix Completed?: No.    Education has been provided regarding the importance of this vaccine. Patient has been advised to call insurance company to determine out of pocket expense if they have not yet received this vaccine. Advised may also receive vaccine at local pharmacy or Health Dept. Verbalized acceptance and understanding.  Screening Tests Health Maintenance  Topic Date Due   TETANUS/TDAP  12/08/2018   PNA vac Low Risk Adult (2 of 2 - PPSV23) 05/08/2019   COLONOSCOPY (Pts 45-33yrs Insurance coverage will need to be confirmed)  01/04/2023   Hepatitis C Screening  Completed   INFLUENZA VACCINE  Discontinued   COVID-19 Vaccine  Discontinued    Health Maintenance  Health Maintenance Due  Topic Date Due   TETANUS/TDAP  12/08/2018   PNA vac Low Risk Adult (2 of 2 - PPSV23) 05/08/2019    Colorectal cancer screening: Type of screening: Colonoscopy. Completed 01/04/2020. Repeat every 3 years  Lung Cancer Screening: (Low Dose CT Chest recommended if Age 72-80 years, 30 pack-year currently smoking OR have quit w/in 15years.) does not qualify.   Lung Cancer Screening Referral: N/A   Additional Screening:  Hepatitis C Screening: does qualify; Completed 03/04/2014  Vision Screening: Recommended annual ophthalmology exams for early detection of glaucoma and other disorders of the eye. Is the patient up to date with their annual eye  exam?  Yes  Who is the provider or what is the name of the office in which the patient attends annual eye exams? [facility] in [place]  If pt is not established with a provider, would they like to be referred to a  provider to establish care? No .   Dental Screening: Recommended annual dental exams for proper oral hygiene  Community Resource Referral / Chronic Care Management: CRR required this visit?  No   CCM required this visit?  No      Plan:     I have personally reviewed and noted the following in the patients chart:    Medical and social history  Use of alcohol, tobacco or illicit drugs   Current medications and supplements  Functional ability and status  Nutritional status  Physical activity  Advanced directives  List of other physicians  Hospitalizations, surgeries, and ER visits in previous 12 months  Vitals  Screenings to include cognitive, depression, and falls  Referrals and appointments  In addition, I have reviewed and discussed with patient certain preventive protocols, quality metrics, and best practice recommendations. A written personalized care plan for preventive services as well as general preventive health recommendations were provided to patient.     Ofilia Neas, LPN   1/55/2080   Nurse Notes: None

## 2020-08-20 ENCOUNTER — Ambulatory Visit (INDEPENDENT_AMBULATORY_CARE_PROVIDER_SITE_OTHER): Payer: Medicare Other

## 2020-08-20 DIAGNOSIS — Z0000 Encounter for general adult medical examination without abnormal findings: Secondary | ICD-10-CM

## 2020-08-20 NOTE — Patient Instructions (Signed)
[name] , Thank you for taking time to come for your Medicare Wellness Visit. I appreciate your ongoing commitment to your health goals. Please review the following plan we discussed and let me know if I can assist you in the future.   Screening recommendations/referrals: Colonoscopy: Up to date, next due 01/04/2023 Recommended yearly ophthalmology/optometry visit for glaucoma screening and checkup Recommended yearly dental visit for hygiene and checkup  Vaccinations: Influenza vaccine: Patient declined  Pneumococcal vaccine: Currently due, if you would like to receive you may receive at your next in person office visit. Tdap vaccine: Currently due, you may await and injury to receive as it will be covered by medicare or if you would like to receive now you may get at your local pharmacy.  Shingles vaccine: Currently due for Shingrix, if you would like to receive we recommend that you receive at your local pharmacy as it is less expensive.     Advanced directives: Advance directive discussed with you today. Even though you declined this today please call our office should you change your mind and we can give you the proper paperwork for you to fill out.   Conditions/risks identified: None   Next appointment: 09/12/2020 @ 11:30 am with Dr. Ethlyn Gallery  Preventive Care 31 Years and Older, Male Preventive care refers to lifestyle choices and visits with your health care provider that can promote health and wellness. What does preventive care include?  A yearly physical exam. This is also called an annual well check.  Dental exams once or twice a year.  Routine eye exams. Ask your health care provider how often you should have your eyes checked.  Personal lifestyle choices, including:  Daily care of your teeth and gums.  Regular physical activity.  Eating a healthy diet.  Avoiding tobacco and drug use.  Limiting alcohol use.  Practicing safe sex.  Taking low doses of aspirin  every day.  Taking vitamin and mineral supplements as recommended by your health care provider. What happens during an annual well check? The services and screenings done by your health care provider during your annual well check will depend on your age, overall health, lifestyle risk factors, and family history of disease. Counseling  Your health care provider may ask you questions about your:  Alcohol use.  Tobacco use.  Drug use.  Emotional well-being.  Home and relationship well-being.  Sexual activity.  Eating habits.  History of falls.  Memory and ability to understand (cognition).  Work and work Statistician. Screening  You may have the following tests or measurements:  Height, weight, and BMI.  Blood pressure.  Lipid and cholesterol levels. These may be checked every 5 years, or more frequently if you are over 70 years old.  Skin check.  Lung cancer screening. You may have this screening every year starting at age 82 if you have a 30-pack-year history of smoking and currently smoke or have quit within the past 15 years.  Fecal occult blood test (FOBT) of the stool. You may have this test every year starting at age 71.  Flexible sigmoidoscopy or colonoscopy. You may have a sigmoidoscopy every 5 years or a colonoscopy every 10 years starting at age 40.  Prostate cancer screening. Recommendations will vary depending on your family history and other risks.  Hepatitis C blood test.  Hepatitis B blood test.  Sexually transmitted disease (STD) testing.  Diabetes screening. This is done by checking your blood sugar (glucose) after you have not eaten for a  while (fasting). You may have this done every 1-3 years.  Abdominal aortic aneurysm (AAA) screening. You may need this if you are a current or former smoker.  Osteoporosis. You may be screened starting at age 36 if you are at high risk. Talk with your health care provider about your test results, treatment  options, and if necessary, the need for more tests. Vaccines  Your health care provider may recommend certain vaccines, such as:  Influenza vaccine. This is recommended every year.  Tetanus, diphtheria, and acellular pertussis (Tdap, Td) vaccine. You may need a Td booster every 10 years.  Zoster vaccine. You may need this after age 40.  Pneumococcal 13-valent conjugate (PCV13) vaccine. One dose is recommended after age 36.  Pneumococcal polysaccharide (PPSV23) vaccine. One dose is recommended after age 44. Talk to your health care provider about which screenings and vaccines you need and how often you need them. This information is not intended to replace advice given to you by your health care provider. Make sure you discuss any questions you have with your health care provider. Document Released: 07/13/2015 Document Revised: 03/05/2016 Document Reviewed: 04/17/2015 Elsevier Interactive Patient Education  2017 [place] Prevention in the Home Falls can cause injuries. They can happen to people of all ages. There are many things you can do to make your home safe and to help prevent falls. What can I do on the outside of my home?  Regularly fix the edges of walkways and driveways and fix any cracks.  Remove anything that might make you trip as you walk through a door, such as a raised step or threshold.  Trim any bushes or trees on the path to your home.  Use bright outdoor lighting.  Clear any walking paths of anything that might make someone trip, such as rocks or tools.  Regularly check to see if handrails are loose or broken. Make sure that both sides of any steps have handrails.  Any raised decks and porches should have guardrails on the edges.  Have any leaves, snow, or ice cleared regularly.  Use sand or salt on walking paths during winter.  Clean up any spills in your garage right away. This includes oil or grease spills. What can I do in the  bathroom?  Use night lights.  Install grab bars by the toilet and in the tub and shower. Do not use towel bars as grab bars.  Use non-skid mats or decals in the tub or shower.  If you need to sit down in the shower, use a plastic, non-slip stool.  Keep the floor dry. Clean up any water that spills on the floor as soon as it happens.  Remove soap buildup in the tub or shower regularly.  Attach bath mats securely with double-sided non-slip rug tape.  Do not have throw rugs and other things on the floor that can make you trip. What can I do in the bedroom?  Use night lights.  Make sure that you have a light by your bed that is easy to reach.  Do not use any sheets or blankets that are too big for your bed. They should not hang down onto the floor.  Have a firm chair that has side arms. You can use this for support while you get dressed.  Do not have throw rugs and other things on the floor that can make you trip. What can I do in the kitchen?  Clean up any spills right away.  Avoid walking on wet floors.  Keep items that you use a lot in easy-to-reach places.  If you need to reach something above you, use a strong step stool that has a grab bar.  Keep electrical cords out of the way.  Do not use floor polish or wax that makes floors slippery. If you must use wax, use non-skid floor wax.  Do not have throw rugs and other things on the floor that can make you trip. What can I do with my stairs?  Do not leave any items on the stairs.  Make sure that there are handrails on both sides of the stairs and use them. Fix handrails that are broken or loose. Make sure that handrails are as long as the stairways.  Check any carpeting to make sure that it is firmly attached to the stairs. Fix any carpet that is loose or worn.  Avoid having throw rugs at the top or bottom of the stairs. If you do have throw rugs, attach them to the floor with carpet tape.  Make sure that you have a  light switch at the top of the stairs and the bottom of the stairs. If you do not have them, ask someone to add them for you. What else can I do to help prevent falls?  Wear shoes that:  Do not have high heels.  Have rubber bottoms.  Are comfortable and fit you well.  Are closed at the toe. Do not wear sandals.  If you use a stepladder:  Make sure that it is fully opened. Do not climb a closed stepladder.  Make sure that both sides of the stepladder are locked into place.  Ask someone to hold it for you, if possible.  Clearly mark and make sure that you can see:  Any grab bars or handrails.  First and last steps.  Where the edge of each step is.  Use tools that help you move around (mobility aids) if they are needed. These include:  Canes.  Walkers.  Scooters.  Crutches.  Turn on the lights when you go into a dark area. Replace any light bulbs as soon as they burn out.  Set up your furniture so you have a clear path. Avoid moving your furniture around.  If any of your floors are uneven, fix them.  If there are any pets around you, be aware of where they are.  Review your medicines with your doctor. Some medicines can make you feel dizzy. This can increase your chance of falling. Ask your doctor what other things that you can do to help prevent falls. This information is not intended to replace advice given to you by your health care provider. Make sure you discuss any questions you have with your health care provider. Document Released: 04/12/2009 Document Revised: 11/22/2015 Document Reviewed: 07/21/2014 Elsevier Interactive Patient Education  2017 [employer].

## 2020-09-11 ENCOUNTER — Other Ambulatory Visit: Payer: Self-pay

## 2020-09-12 ENCOUNTER — Telehealth (INDEPENDENT_AMBULATORY_CARE_PROVIDER_SITE_OTHER): Payer: Medicare Other | Admitting: Family Medicine

## 2020-09-12 ENCOUNTER — Encounter: Payer: Self-pay | Admitting: Family Medicine

## 2020-09-12 DIAGNOSIS — I10 Essential (primary) hypertension: Secondary | ICD-10-CM | POA: Diagnosis not present

## 2020-09-12 DIAGNOSIS — J019 Acute sinusitis, unspecified: Secondary | ICD-10-CM

## 2020-09-12 DIAGNOSIS — E785 Hyperlipidemia, unspecified: Secondary | ICD-10-CM

## 2020-09-12 MED ORDER — BENZONATATE 100 MG PO CAPS
200.0000 mg | ORAL_CAPSULE | Freq: Three times a day (TID) | ORAL | 2 refills | Status: DC | PRN
Start: 2020-09-12 — End: 2021-01-18

## 2020-09-12 MED ORDER — DOXYCYCLINE HYCLATE 100 MG PO TABS
100.0000 mg | ORAL_TABLET | Freq: Two times a day (BID) | ORAL | 0 refills | Status: AC
Start: 2020-09-12 — End: 2020-09-19

## 2020-09-12 MED ORDER — MELOXICAM 15 MG PO TABS: 15.0000 mg | ORAL_TABLET | ORAL | 2 refills | Status: DC | PRN

## 2020-09-12 NOTE — Progress Notes (Signed)
Virtual Visit via Telephone Note  I connected with [name] on 09/12/20 at 11:30 AM EDT by telephone and verified that I am speaking with the correct person using two identifiers.   I discussed the limitations, risks, security and privacy concerns of performing an evaluation and management service by telephone and the availability of in person appointments. I also discussed with the patient that there may be a patient responsible charge related to this service. The patient expressed understanding and agreed to proceed.  Location patient: home Location provider:  [facility]  [place], [place] [address]  Participants present for the call: patient, provider Patient did not have a visit in the prior 7 days to address this/these issue(s).    [name] DOB: [birth year] Encounter date: 09/12/2020  This is a 75 y.o. male who presents with Chief Complaint  Patient presents with   Follow-up   Sinus Problem    Patient complains of headache, head and nasal congestion x6 weeks, tried Mucinex and saline nasal spray with some relief    History of present illness: Taking mucinex, vitamins, nasal spray but still not better. Head hurts off and on; has been hurting on and off. Now coughing; started this about 3 weeks. Cough is productive. Has a lot of drainage. Using saline nasal spray.   No fevers. Not short of breath.   Shoulder has been bothering him; has seen ortho; was supposed to have surgery but he did get COVID which delayed things, then noted hernia that popped up and was told he should get this corrected.   HTN: taking lisinopril 10mg  daily; last check 138/86 HL: continue with lipitor 80mg  GERD: continue with protonix.   Allergies  Allergen Reactions   Codeine Nausea Only   Current Meds  Medication Sig   amitriptyline (ELAVIL) 50 MG tablet Take 1 tablet (50 mg total) by mouth at bedtime. (Patient taking differently: Take 75 mg by mouth at  bedtime.)   aspirin EC 81 MG EC tablet Take 1 tablet (81 mg total) by mouth daily.   atorvastatin (LIPITOR) 80 MG tablet Take 80 mg by mouth daily.   benzonatate (TESSALON PERLES) 100 MG capsule Take 2 capsules (200 mg total) by mouth 3 (three) times daily as needed for cough.   Cyanocobalamin (VITAMIN B-12) 1000 MCG SUBL PLACE 1 TABLET (1,000 MCG TOTAL) UNDER THE TONGUE DAILY.   doxycycline (VIBRA-TABS) 100 MG tablet Take 1 tablet (100 mg total) by mouth 2 (two) times daily for 7 days.   fluticasone (FLONASE) 50 MCG/ACT nasal spray Place 2 sprays into both nostrils daily.   lisinopril (ZESTRIL) 10 MG tablet Take 10 mg by mouth daily.   meclizine (ANTIVERT) 12.5 MG tablet Take 1-2 tablets (12.5-25 mg total) by mouth every 6 (six) hours as needed for dizziness.   niacinamide 500 MG tablet Take 500 mg by mouth daily.   pantoprazole (PROTONIX) 40 MG tablet TAKE 1 TABLET BY MOUTH EVERY DAY   [DISCONTINUED] meloxicam (MOBIC) 15 MG tablet Take 15 mg by mouth as needed for pain.    Review of Systems  Constitutional: Negative for chills, fatigue and fever.  HENT: Positive for congestion, sinus pressure and sinus pain. Negative for ear pain and trouble swallowing.   Respiratory: Positive for cough. Negative for chest tightness, shortness of breath and wheezing.   Cardiovascular: Negative for chest pain, palpitations and leg swelling.    Objective:  There were no vitals taken for this visit.  BP Readings from Last 3 Encounters:  01/04/20 124/76  12/08/19 (!) 126/59  06/09/19 (!) 150/94   Wt Readings from Last 3 Encounters:  01/04/20 204 lb (92.5 kg)  12/19/19 204 lb (92.5 kg)  12/08/19 208 lb (94.3 kg)    EXAM:  GENERAL: alert, oriented, appears well and in no acute distress  HEENT:  Nasal congestion.  LUNGS: occasional hacking cough, no noted SOB  PSYCH/NEURO: pleasant and cooperative, no obvious depression or anxiety, speech and thought processing grossly  intact   Assessment/Plan  1. Acute non-recurrent sinusitis, unspecified location abx as directed. Let me know if not improving with this. Continue with mucinex. Tessalon perles as needed.   2. Essential hypertension Well controlled. Continue with lisinopril 10mg  daily.  3. Hyperlipidemia, unspecified hyperlipidemia type Continue with lipitor 80mg  daily.    I discussed the assessment and treatment plan with the patient. The patient was provided an opportunity to ask questions and all were answered. The patient agreed with the plan and demonstrated an understanding of the instructions.   The patient was advised to call back or seek an in-person evaluation if the symptoms worsen or if the condition fails to improve as anticipated.  I provided 20 minutes of non-face-to-face time during this encounter.   Charles Rough, MD

## 2020-12-05 ENCOUNTER — Telehealth: Payer: Self-pay

## 2020-12-05 NOTE — Telephone Encounter (Signed)
Pt called in to r/s his 12/07/20 appt to later in July   [name]

## 2020-12-07 ENCOUNTER — Other Ambulatory Visit: Payer: Medicare Other

## 2020-12-07 ENCOUNTER — Ambulatory Visit: Payer: Medicare Other | Admitting: Hematology & Oncology

## 2020-12-10 ENCOUNTER — Other Ambulatory Visit: Payer: Self-pay | Admitting: Family Medicine

## 2020-12-13 DIAGNOSIS — L905 Scar conditions and fibrosis of skin: Secondary | ICD-10-CM | POA: Diagnosis not present

## 2020-12-13 DIAGNOSIS — L578 Other skin changes due to chronic exposure to nonionizing radiation: Secondary | ICD-10-CM | POA: Diagnosis not present

## 2020-12-13 DIAGNOSIS — L570 Actinic keratosis: Secondary | ICD-10-CM | POA: Diagnosis not present

## 2020-12-13 DIAGNOSIS — L820 Inflamed seborrheic keratosis: Secondary | ICD-10-CM | POA: Diagnosis not present

## 2020-12-13 DIAGNOSIS — Z85828 Personal history of other malignant neoplasm of skin: Secondary | ICD-10-CM | POA: Diagnosis not present

## 2020-12-13 DIAGNOSIS — Z872 Personal history of diseases of the skin and subcutaneous tissue: Secondary | ICD-10-CM | POA: Diagnosis not present

## 2020-12-13 DIAGNOSIS — L821 Other seborrheic keratosis: Secondary | ICD-10-CM | POA: Diagnosis not present

## 2020-12-13 DIAGNOSIS — D225 Melanocytic nevi of trunk: Secondary | ICD-10-CM | POA: Diagnosis not present

## 2020-12-13 DIAGNOSIS — L218 Other seborrheic dermatitis: Secondary | ICD-10-CM | POA: Diagnosis not present

## 2020-12-13 DIAGNOSIS — L918 Other hypertrophic disorders of the skin: Secondary | ICD-10-CM | POA: Diagnosis not present

## 2020-12-13 DIAGNOSIS — D1801 Hemangioma of skin and subcutaneous tissue: Secondary | ICD-10-CM | POA: Diagnosis not present

## 2021-01-17 ENCOUNTER — Other Ambulatory Visit: Payer: Self-pay | Admitting: *Deleted

## 2021-01-17 DIAGNOSIS — D72829 Elevated white blood cell count, unspecified: Secondary | ICD-10-CM

## 2021-01-17 DIAGNOSIS — K8689 Other specified diseases of pancreas: Secondary | ICD-10-CM

## 2021-01-18 ENCOUNTER — Inpatient Hospital Stay: Payer: 59 | Admitting: Hematology & Oncology

## 2021-01-18 ENCOUNTER — Inpatient Hospital Stay: Payer: 59

## 2021-01-18 ENCOUNTER — Encounter: Payer: Self-pay | Admitting: Family

## 2021-01-18 ENCOUNTER — Inpatient Hospital Stay: Payer: Medicare Other | Attending: Hematology & Oncology

## 2021-01-18 ENCOUNTER — Other Ambulatory Visit: Payer: Self-pay

## 2021-01-18 ENCOUNTER — Inpatient Hospital Stay (HOSPITAL_BASED_OUTPATIENT_CLINIC_OR_DEPARTMENT_OTHER): Payer: Medicare Other | Admitting: Family

## 2021-01-18 VITALS — BP 138/72 | HR 80 | Temp 98.2°F | Resp 18 | Ht 68.11 in | Wt 209.1 lb

## 2021-01-18 DIAGNOSIS — D72829 Elevated white blood cell count, unspecified: Secondary | ICD-10-CM

## 2021-01-18 DIAGNOSIS — K8689 Other specified diseases of pancreas: Secondary | ICD-10-CM

## 2021-01-18 DIAGNOSIS — G629 Polyneuropathy, unspecified: Secondary | ICD-10-CM | POA: Diagnosis not present

## 2021-01-18 LAB — CBC WITH DIFFERENTIAL (CANCER CENTER ONLY)
Abs Immature Granulocytes: 0.11 10*3/uL — ABNORMAL HIGH (ref 0.00–0.07)
Basophils Absolute: 0.1 10*3/uL (ref 0.0–0.1)
Basophils Relative: 1 %
Eosinophils Absolute: 0.1 10*3/uL (ref 0.0–0.5)
Eosinophils Relative: 1 %
HCT: 44.6 % (ref 39.0–52.0)
Hemoglobin: 15.4 g/dL (ref 13.0–17.0)
Immature Granulocytes: 1 %
Lymphocytes Relative: 25 %
Lymphs Abs: 2.9 10*3/uL (ref 0.7–4.0)
MCH: 32.1 pg (ref 26.0–34.0)
MCHC: 34.5 g/dL (ref 30.0–36.0)
MCV: 92.9 fL (ref 80.0–100.0)
Monocytes Absolute: 0.8 10*3/uL (ref 0.1–1.0)
Monocytes Relative: 7 %
Neutro Abs: 7.5 10*3/uL (ref 1.7–7.7)
Neutrophils Relative %: 65 %
Platelet Count: 218 10*3/uL (ref 150–400)
RBC: 4.8 MIL/uL (ref 4.22–5.81)
RDW: 12.7 % (ref 11.5–15.5)
WBC Count: 11.5 10*3/uL — ABNORMAL HIGH (ref 4.0–10.5)
nRBC: 0 % (ref 0.0–0.2)

## 2021-01-18 LAB — LACTATE DEHYDROGENASE: LDH: 151 U/L (ref 98–192)

## 2021-01-18 LAB — CMP (CANCER CENTER ONLY)
ALT: 24 U/L (ref 0–44)
AST: 22 U/L (ref 15–41)
Albumin: 4.3 g/dL (ref 3.5–5.0)
Alkaline Phosphatase: 85 U/L (ref 38–126)
Anion gap: 7 (ref 5–15)
BUN: 16 mg/dL (ref 8–23)
CO2: 30 mmol/L (ref 22–32)
Calcium: 10 mg/dL (ref 8.9–10.3)
Chloride: 98 mmol/L (ref 98–111)
Creatinine: 1.19 mg/dL (ref 0.61–1.24)
GFR, Estimated: >60 mL/min (ref >60)
Glucose, Bld: 212 mg/dL — ABNORMAL HIGH (ref 70–99)
Potassium: 4.9 mmol/L (ref 3.5–5.1)
Sodium: 135 mmol/L (ref 135–145)
Total Bilirubin: 0.7 mg/dL (ref 0.3–1.2)
Total Protein: 7.2 g/dL (ref 6.5–8.1)

## 2021-01-18 LAB — C-REACTIVE PROTEIN: CRP: 1 mg/dL — ABNORMAL HIGH (ref <1.0)

## 2021-01-18 LAB — SEDIMENTATION RATE: Sed Rate: 3 mm/hr (ref 0–16)

## 2021-01-18 NOTE — Progress Notes (Signed)
Hematology and Oncology Follow Up Visit  [name] [id number] 1946-01-18 75 y.o. 01/18/2021   Principle Diagnosis:  Chronic reactive leukocytosis - chronic arthritis and skin cancer, flow cytometry was negative   Current Therapy:        Observation   Interim History:  [name] is here today for follow-up. He is doing quite well but does note fatigue at times. He takes a break to rest when needed.  His benign tremor has gotten a little worse with time.  No fever, chills, n/v, cough, rash, SOB, chest pain, palpitations, abdominal pain or changes in bowel or bladder habits.  He has occasional dizziness secondary to vertigo.  No blood loss noted. No bruising or petechiae.  No swelling or tenderness in his extremities at this time.  He states that he has neuropathy in the left lower extremity and right sided lower back pain with sciatica down the right leg.  No falls or syncope to report.  He has maintained a good appetite and is staying well hydrated. His weight is stable at 209 lbs.   ECOG Performance Status: 0 - Asymptomatic  Medications:  Allergies as of 01/18/2021       Reactions   Codeine Nausea Only        Medication List        Accurate as of January 18, 2021 10:27 AM. If you have any questions, ask your nurse or doctor.          STOP taking these medications    benzonatate 100 MG capsule Commonly known as: Best boy Stopped by: Laverna Peace, NP       TAKE these medications    amitriptyline 50 MG tablet Commonly known as: ELAVIL Take 1 tablet (50 mg total) by mouth at bedtime. What changed: how much to take   aspirin 81 MG EC tablet Take 1 tablet (81 mg total) by mouth daily.   atorvastatin 80 MG tablet Commonly known as: LIPITOR Take 80 mg by mouth daily.   fluticasone 50 MCG/ACT nasal spray Commonly known as: FLONASE Place 2 sprays into both nostrils daily.   lisinopril 10 MG tablet Commonly known as: ZESTRIL Take 10 mg by  mouth daily.   meclizine 12.5 MG tablet Commonly known as: ANTIVERT Take 1-2 tablets (12.5-25 mg total) by mouth every 6 (six) hours as needed for dizziness.   meloxicam 15 MG tablet Commonly known as: MOBIC TAKE 1 TABLET (15 MG TOTAL) BY MOUTH AS NEEDED FOR PAIN.   niacinamide 500 MG tablet Take 500 mg by mouth daily.   pantoprazole 40 MG tablet Commonly known as: PROTONIX TAKE 1 TABLET BY MOUTH EVERY DAY   Vitamin B-12 1000 MCG Subl PLACE 1 TABLET (1,000 MCG TOTAL) UNDER THE TONGUE DAILY.        Allergies:  Allergies  Allergen Reactions   Codeine Nausea Only    Past Medical History, Surgical history, Social history, and Family History were reviewed and updated.  Review of Systems: All other 10 point review of systems is negative.   Physical Exam:  height is 5' 8.11" (1.73 m) and weight is 209 lb 1.3 oz (94.8 kg). His oral temperature is 98.2 F (36.8 C). His blood pressure is 138/72 and his pulse is 80. His respiration is 18 and oxygen saturation is 98%.   Wt Readings from Last 3 Encounters:  01/18/21 209 lb 1.3 oz (94.8 kg)  01/04/20 204 lb (92.5 kg)  12/19/19 204 lb (92.5 kg)    Ocular:  Sclerae unicteric, pupils equal, round and reactive to light Ear-nose-throat: Oropharynx clear, dentition fair Lymphatic: No cervical or supraclavicular adenopathy Lungs no rales or rhonchi, good excursion bilaterally Heart regular rate and rhythm, no murmur appreciated Abd soft, nontender, positive bowel sounds MSK no focal spinal tenderness, no joint edema Neuro: non-focal, well-oriented, appropriate affect Breasts: Deferred   Lab Results  Component Value Date   WBC 11.5 (H) 01/18/2021   HGB 15.4 01/18/2021   HCT 44.6 01/18/2021   MCV 92.9 01/18/2021   PLT 218 01/18/2021   Lab Results  Component Value Date   FERRITIN 244.0 11/06/2009   IRON 64 11/06/2009   IRONPCTSAT 20.9 11/06/2009   Lab Results  Component Value Date   RBC 4.80 01/18/2021   No results  found for: Nils Pyle, [facility] Lab Results  Component Value Date   IGA 212 11/06/2009   No results found for: Odetta Pink, [facility]   Chemistry      Component Value Date/Time   NA 135 01/18/2021 0950   K 4.9 01/18/2021 0950   CL 98 01/18/2021 0950   CO2 30 01/18/2021 0950   BUN 16 01/18/2021 0950   CREATININE 1.19 01/18/2021 0950      Component Value Date/Time   CALCIUM 10.0 01/18/2021 0950   ALKPHOS 85 01/18/2021 0950   AST 22 01/18/2021 0950   ALT 24 01/18/2021 0950   BILITOT 0.7 01/18/2021 0950       Impression and Plan: [name] is a very pleasant 75 yo caucasian gentleman with chronic reactive leukocytosis.  His count remain stable to improved. WBC count is 11.5, neutrophils 65%, lymphocytes 25%, Hgb 15.4, MCV 92 and platelets 218.  He continues to do well and has no complaints at this time.  Follow-up in 1 year.  He can contact our office with any questions or concerns.   Laverna Peace, NP 7/22/202210:27 AM

## 2021-03-10 ENCOUNTER — Other Ambulatory Visit: Payer: Self-pay | Admitting: Family Medicine

## 2021-04-11 ENCOUNTER — Other Ambulatory Visit: Payer: Self-pay

## 2021-04-11 ENCOUNTER — Encounter: Payer: Self-pay | Admitting: Physical Therapy

## 2021-04-11 ENCOUNTER — Ambulatory Visit: Payer: No Typology Code available for payment source | Attending: Orthopedic Surgery | Admitting: Physical Therapy

## 2021-04-11 DIAGNOSIS — M6281 Muscle weakness (generalized): Secondary | ICD-10-CM | POA: Insufficient documentation

## 2021-04-11 DIAGNOSIS — M25512 Pain in left shoulder: Secondary | ICD-10-CM | POA: Diagnosis not present

## 2021-04-11 DIAGNOSIS — M25612 Stiffness of left shoulder, not elsewhere classified: Secondary | ICD-10-CM | POA: Diagnosis present

## 2021-04-11 DIAGNOSIS — G8929 Other chronic pain: Secondary | ICD-10-CM | POA: Insufficient documentation

## 2021-04-11 NOTE — Therapy (Signed)
[facility] [place], [place], [address] Phone: [phone]   Fax:  [phone]  Physical Therapy Evaluation  Patient Details  Name: [name] MRN: [id number] Date of Birth: [birth year] Referring Provider (PT): Lawerance Sabal PA-C   Encounter Date: 04/11/2021   PT End of Session - 04/11/21 1253     Visit Number 1    Number of Visits 12    Date for PT Re-Evaluation 05/23/21    Authorization - Visit Number --    Authorization - Number of Visits --    PT Start Time 0900    PT Stop Time 0254    PT Time Calculation (min) 50 min    Activity Tolerance Patient tolerated treatment well    Behavior During Therapy [facility] for tasks assessed/performed             Past Medical History:  Diagnosis Date   ALLERGIC RHINITIS CAUSE UNSPECIFIED 05/27/2010   Arthritis    BACK PAIN, UPPER 02/21/2010   Blood transfusion without reported diagnosis    with knee replacement   Cervicalgia 05/27/2010   CHEST PAIN UNSPECIFIED 04/25/2009   DIVERTICULOSIS, COLON 03/09/2007   DIZZINESS, CHRONIC 03/09/2007   GERD 12/24/2006   HEADACHE, SINUS 04/06/2009   History of colon polyps    HYPERLIPIDEMIA 12/24/2006   diet controlled   HYPERTENSION 12/24/2006   better after weight loss   INSOMNIA WITH SLEEP APNEA UNSPECIFIED 05/27/2010   Leukocytosis 12/08/2019   Motion sickness    Neuromuscular disorder (HCC)    tremor   Neuropathy    Open wound of wrist, complicated 2/70/6237   OSTEOARTHRITIS 12/24/2006   Other constipation 11/06/2009   Pneumonia    PONV (postoperative nausea and vomiting)    PROSTATITIS, RECURRENT 12/30/2007   SHOULDER PAIN, BILATERAL 07/16/2007   Sleep apnea    Squamous cell carcinoma, ear, left    TRANSIENT ISCHEMIC ATTACKS, HX OF 04/06/2009    Past Surgical History:  Procedure Laterality Date   ANKLE ARTHROSCOPY W/ OPEN REPAIR  2002   Repair   COLONOSCOPY     COLONOSCOPY     around 2017. [place]    CYST EXCISION      from back 2015. likely this was meaning of ear cyst   EAR CYST EXCISION Bilateral 01/18/2013   Procedure: EXCISION CYSTS FROM BACK (TWO);  Surgeon: Earnstine Regal, MD;  Location: [place];  Service: General;  Laterality: Bilateral;   epidydmal cyst excision     ESOPHAGOGASTRODUODENOSCOPY     [place] around 2017   [place]  2005/2007   SKIN CANCER EXCISION  2020   ear, squamous cell   TONSILLECTOMY      There were no vitals filed for this visit.    Subjective Assessment - 04/11/21 1246     Subjective COVID-19 screen performed prior to patient entering clinic.  The patient presents to the clinic today with chronic left shoulder pain and recent exacerbations including a fall and haging from arms when a ladder went out from under him.  His pain at rest is a 4/10 today but can rise to a 10/10 with repetitve use of his left UE. An MRI in 11/2018 revealed:1. Intact rotator cuff with minimal supraspinatus and infraspinatus  tendinosis.  2. Diminutive long head biceps tendon within the bicipital groove,  potentially reflecting chronic partial tear.  3. Posterosuperior and posterior labral tear.  4. Mild glenohumeral and acromioclavicular osteoarthritis.    Pertinent History  H/o TIA's, h/o spinal pain, OA and ankle surgery, bilateral TKA's, h/o TIA's, UE tremor    Patient Stated Goals Use left UE without pain.    Currently in Pain? Yes    Pain Orientation Left    Pain Descriptors / Indicators Sore    Pain Type Chronic pain    Pain Onset More than a month ago    Pain Frequency Constant    Aggravating Factors  See above.    Pain Relieving Factors Rest.                [facility] PT Assessment - 04/11/21 0001       Assessment   Medical Diagnosis Left shoulder pain.    Referring Provider (PT) Alexanzandrew Dara Lords PA-C    Onset Date/Surgical Date --   Ongoing.     Precautions   Precautions None      Restrictions   Weight Bearing Restrictions No       Balance Screen   Has the patient fallen in the past 6 months Yes    How many times? 1.    Has the patient had a decrease in activity level because of a fear of falling?  No    Is the patient reluctant to leave their home because of a fear of falling?  No      Home Environment   Living Environment Private residence      Prior Function   Level of Independence Independent      Posture/Postural Control   Posture/Postural Control Postural limitations    Postural Limitations Rounded Shoulders;Forward head      Deep Tendon Reflexes   Biceps DTR 1+   LT   Brachioradialis DTR 1+   LT   Triceps DTR 1+   LT     ROM / Strength   AROM / PROM / Strength AROM;Strength      AROM   Overall AROM Comments Active left shoulder flexion to 115 degrees and passively to 120 degrees, ER is 70 degrees active and 75 degrees passively.  Behind back only to iliac crest.      Strength   Overall Strength Comments Left shoulder abduction and ER is 4- to 4/5.      Palpation   Palpation comment Tender to palpation over patient's left UT and posterior cuff.      Special Tests    Special Tests Rotator Cuff Impingement    Rotator Cuff Impingment tests Michel Bickers test;Neer impingement test      Neer Impingement test    Findings Positive      Hawkins-Kennedy test   Findings Positive                        Objective measurements completed on examination: See above findings.       [facility] Adult PT Treatment/Exercise - 04/11/21 0001       Modalities   Modalities Electrical Stimulation      Electrical Stimulation   Electrical Stimulation Location Left shoulder    Electrical Stimulation Action IFC at 80-150 Hz.    Electrical Stimulation Parameters 40% scan x 20 minutes.    Electrical Stimulation Goals Pain;Tone                       PT Short Term Goals - 09/13/18 1201       PT SHORT TERM GOAL #1   Title STG's=LTG's.  PT Long Term  Goals - 04/11/21 1557       PT LONG TERM GOAL #1   Title Independent with a HEP.    Time 6    Period Weeks    Status New      PT LONG TERM GOAL #2   Title Active left shoulder flexion to 150 degrees so the patient can easily reach overhead.    Time 6    Period Weeks    Status New      PT LONG TERM GOAL #3   Title Increase left shoulder ER strength to a solid 4+/5 to increase stability for performance of functional activities.    Period Weeks    Status New      PT LONG TERM GOAL #4   Title Perform ADL's with left shoulder pain not > 3-4/10.    Time 6    Period Weeks    Status New                    Plan - 04/11/21 1509     Clinical Impression Statement The patient presents to OPPT with chronic left shoulder pain and recent excerbations.  His left left range of motion is limited and he has experienced a loss of functional use of the extremity.  Repetitive use of his left UE produce severe pain.  He demonstrates positive impingement testing of his left shoulder.  He was tender to palpation over his left UT and posterior cuff musculature.  He has some weakness into ER and abduction.  Patient will benefit from skilled physical therapy intervention to address pain and deficits.    Personal Factors and Comorbidities Comorbidity 1;Comorbidity 2;Other    Comorbidities H/o TIA's, h/o spinal pain, OA and ankle surgery, bilateral TKA's, h/o TIA's, UE tremor    Examination-Activity Limitations Other;Reach Overhead    Examination-Participation Restrictions Other;Yard Work    Biomedical scientist Low    Rehab Potential Good    PT Frequency 2x / week    PT Duration 6 weeks    PT Treatment/Interventions ADLs/Self Care Home Management;Cryotherapy;Electrical Stimulation;Moist Heat;Ultrasound;Therapeutic exercise;Therapeutic activities;Patient/family education;Manual techniques;Passive range of motion;Dry  needling;Vasopneumatic Device;Joint Manipulations    PT Next Visit Plan Modalities and STW/M, PROM to patient's left shoulder.  PRE's.    Consulted and Agree with Plan of Care Patient             Patient will benefit from skilled therapeutic intervention in order to improve the following deficits and impairments:  Pain, Decreased activity tolerance, Decreased range of motion, Decreased strength, Increased muscle spasms  Visit Diagnosis: Chronic left shoulder pain - Plan: PT plan of care cert/re-cert  Stiffness of left shoulder, not elsewhere classified - Plan: PT plan of care cert/re-cert  Muscle weakness (generalized) - Plan: PT plan of care cert/re-cert     Problem List Patient Active Problem List   Diagnosis Date Noted   Leukocytosis 12/08/2019   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   Tremor 04/10/2014   Elevated hemoglobin A1c 04/10/2014   Solitary pulmonary nodule 04/10/2014   Constipation, slow transit 08/14/2010   Allergic rhinitis 05/27/2010   Cervicalgia 05/27/2010   Insomnia with sleep apnea 05/27/2010   HEADACHE, SINUS 04/06/2009   SHOULDER PAIN, BILATERAL 07/16/2007   DIZZINESS, CHRONIC 03/09/2007   Hyperlipidemia 12/24/2006   Essential hypertension 12/24/2006   GERD 12/24/2006   Osteoarthritis 12/24/2006    Annaliyah Willig, [place], PT 04/11/2021, 4:01 PM  [facility] [place], [place], [address] Phone: [phone]   Fax:  [phone]  Name: [name] MRN: [id number] Date of Birth: [birth year]

## 2021-04-16 ENCOUNTER — Other Ambulatory Visit: Payer: Self-pay

## 2021-04-16 ENCOUNTER — Ambulatory Visit: Payer: No Typology Code available for payment source | Admitting: Physical Therapy

## 2021-04-16 DIAGNOSIS — M25612 Stiffness of left shoulder, not elsewhere classified: Secondary | ICD-10-CM

## 2021-04-16 DIAGNOSIS — M6281 Muscle weakness (generalized): Secondary | ICD-10-CM

## 2021-04-16 DIAGNOSIS — G8929 Other chronic pain: Secondary | ICD-10-CM

## 2021-04-16 DIAGNOSIS — M25512 Pain in left shoulder: Secondary | ICD-10-CM

## 2021-04-16 NOTE — Therapy (Signed)
[facility] [place], [place], [address] Phone: [phone]   Fax:  [phone]  Physical Therapy Treatment  Patient Details  Name: [name] MRN: [id number] Date of Birth: September 06, 1945 Referring Provider (PT): Lawerance Sabal PA-C   Encounter Date: 04/16/2021   PT End of Session - 04/16/21 1221     Visit Number 2    Number of Visits 12    Date for PT Re-Evaluation 05/23/21    PT Start Time 0945    PT Stop Time 9485    PT Time Calculation (min) 54 min    Activity Tolerance Patient tolerated treatment well    Behavior During Therapy [facility] for tasks assessed/performed             Past Medical History:  Diagnosis Date   ALLERGIC RHINITIS CAUSE UNSPECIFIED 05/27/2010   Arthritis    BACK PAIN, UPPER 02/21/2010   Blood transfusion without reported diagnosis    with knee replacement   Cervicalgia 05/27/2010   CHEST PAIN UNSPECIFIED 04/25/2009   DIVERTICULOSIS, COLON 03/09/2007   DIZZINESS, CHRONIC 03/09/2007   GERD 12/24/2006   HEADACHE, SINUS 04/06/2009   History of colon polyps    HYPERLIPIDEMIA 12/24/2006   diet controlled   HYPERTENSION 12/24/2006   better after weight loss   INSOMNIA WITH SLEEP APNEA UNSPECIFIED 05/27/2010   Leukocytosis 12/08/2019   Motion sickness    Neuromuscular disorder ([place])    tremor   Neuropathy    Open wound of wrist, complicated 4/62/7035   OSTEOARTHRITIS 12/24/2006   Other constipation 11/06/2009   Pneumonia    PONV (postoperative nausea and vomiting)    PROSTATITIS, RECURRENT 12/30/2007   SHOULDER PAIN, BILATERAL 07/16/2007   Sleep apnea    Squamous cell carcinoma, ear, left    TRANSIENT ISCHEMIC ATTACKS, HX OF 04/06/2009    Past Surgical History:  Procedure Laterality Date   ANKLE ARTHROSCOPY W/ OPEN REPAIR  2002   Repair   COLONOSCOPY     COLONOSCOPY     around 2017. [place]    CYST EXCISION     from back 2015. likely this was meaning of ear cyst   EAR CYST EXCISION  Bilateral 01/18/2013   Procedure: EXCISION CYSTS FROM BACK (TWO);  Surgeon: Earnstine Regal, MD;  Location: [place];  Service: General;  Laterality: Bilateral;   epidydmal cyst excision     ESOPHAGOGASTRODUODENOSCOPY     [place] around 2017   [place]  2005/2007   SKIN CANCER EXCISION  2020   ear, squamous cell   TONSILLECTOMY      There were no vitals filed for this visit.   Subjective Assessment - 04/16/21 1219     Subjective COVID-19 screen performed prior to patient entering clinic.  No new complaints.    Pertinent History H/o TIA's, h/o spinal pain, OA and ankle surgery, bilateral TKA's, h/o TIA's, UE tremor    Patient Stated Goals Use left UE without pain.    Currently in Pain? Yes    Pain Score 4     Pain Location Shoulder    Pain Orientation Left    Pain Descriptors / Indicators Sore    Pain Type Chronic pain    Pain Onset More than a month ago                               [facility] Adult PT Treatment/Exercise - 04/16/21  0001       Modalities   Modalities Electrical Stimulation;Moist Heat      Moist Heat Therapy   Number Minutes Moist Heat 20 Minutes    Moist Heat Location --   Left shoulder.     Ultrasound   Ultrasound Location Left UT/post cuff.    Ultrasound Parameters Combo e'stim/US at 1.50 W/CM2 x 12 minutes.    Ultrasound Goals Pain      Manual Therapy   Manual Therapy Soft tissue mobilization    Soft tissue mobilization STW/M x 12 minutes to patient's left UT and posterior cuff musculature to decrease pain and  tone.                          PT Long Term Goals - 04/11/21 1557       PT LONG TERM GOAL #1   Title Independent with a HEP.    Time 6    Period Weeks    Status New      PT LONG TERM GOAL #2   Title Active left shoulder flexion to 150 degrees so the patient can easily reach overhead.    Time 6    Period Weeks    Status New      PT LONG TERM GOAL #3   Title  Increase left shoulder ER strength to a solid 4+/5 to increase stability for performance of functional activities.    Period Weeks    Status New      PT LONG TERM GOAL #4   Title Perform ADL's with left shoulder pain not > 3-4/10.    Time 6    Period Weeks    Status New                   Plan - 04/16/21 1223     Clinical Impression Statement The patient did well with treatment.  He has continued tenderness over his left UT and posterior musculature.    Personal Factors and Comorbidities Comorbidity 1;Comorbidity 2;Other    Comorbidities H/o TIA's, h/o spinal pain, OA and ankle surgery, bilateral TKA's, h/o TIA's, UE tremor    Examination-Activity Limitations Other;Reach Overhead    Examination-Participation Restrictions Other;Yard Work    Merchant navy officer Evolving/Moderate complexity    Rehab Potential Good    PT Frequency 2x / week    PT Duration 6 weeks    PT Treatment/Interventions ADLs/Self Care Home Management;Cryotherapy;Electrical Stimulation;Moist Heat;Ultrasound;Therapeutic exercise;Therapeutic activities;Patient/family education;Manual techniques;Passive range of motion;Dry needling;Vasopneumatic Device;Joint Manipulations    PT Next Visit Plan Modalities and STW/M, PROM to patient's left shoulder.  PRE's.    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 [place], [place], [address] Phone: [phone]   Fax:  [phone]  Name: [name] MRN: [id number] Date of Birth: [birth year]

## 2021-04-19 ENCOUNTER — Other Ambulatory Visit: Payer: Self-pay

## 2021-04-19 ENCOUNTER — Encounter: Payer: Self-pay | Admitting: Physical Therapy

## 2021-04-19 ENCOUNTER — Ambulatory Visit: Payer: No Typology Code available for payment source | Admitting: Physical Therapy

## 2021-04-19 DIAGNOSIS — M25512 Pain in left shoulder: Secondary | ICD-10-CM

## 2021-04-19 DIAGNOSIS — G8929 Other chronic pain: Secondary | ICD-10-CM

## 2021-04-19 DIAGNOSIS — M6281 Muscle weakness (generalized): Secondary | ICD-10-CM

## 2021-04-19 DIAGNOSIS — M25612 Stiffness of left shoulder, not elsewhere classified: Secondary | ICD-10-CM

## 2021-04-19 NOTE — Therapy (Signed)
[facility] [place], [place], [address] Phone: [phone]   Fax:  [phone]  Physical Therapy Treatment  Patient Details  Name: [name] MRN: [id number] Date of Birth: Jun 11, 1946 Referring Provider (PT): Lawerance Sabal PA-C   Encounter Date: 04/19/2021   PT End of Session - 04/19/21 0944     Visit Number 3    Number of Visits 12    Date for PT Re-Evaluation 05/23/21    PT Start Time 0948    PT Stop Time 1031    PT Time Calculation (min) 43 min    Activity Tolerance Patient tolerated treatment well    Behavior During Therapy [facility] for tasks assessed/performed             Past Medical History:  Diagnosis Date   ALLERGIC RHINITIS CAUSE UNSPECIFIED 05/27/2010   Arthritis    BACK PAIN, UPPER 02/21/2010   Blood transfusion without reported diagnosis    with knee replacement   Cervicalgia 05/27/2010   CHEST PAIN UNSPECIFIED 04/25/2009   DIVERTICULOSIS, COLON 03/09/2007   DIZZINESS, CHRONIC 03/09/2007   GERD 12/24/2006   HEADACHE, SINUS 04/06/2009   History of colon polyps    HYPERLIPIDEMIA 12/24/2006   diet controlled   HYPERTENSION 12/24/2006   better after weight loss   INSOMNIA WITH SLEEP APNEA UNSPECIFIED 05/27/2010   Leukocytosis 12/08/2019   Motion sickness    Neuromuscular disorder ([place])    tremor   Neuropathy    Open wound of wrist, complicated 4/96/7591   OSTEOARTHRITIS 12/24/2006   Other constipation 11/06/2009   Pneumonia    PONV (postoperative nausea and vomiting)    PROSTATITIS, RECURRENT 12/30/2007   SHOULDER PAIN, BILATERAL 07/16/2007   Sleep apnea    Squamous cell carcinoma, ear, left    TRANSIENT ISCHEMIC ATTACKS, HX OF 04/06/2009    Past Surgical History:  Procedure Laterality Date   ANKLE ARTHROSCOPY W/ OPEN REPAIR  2002   Repair   COLONOSCOPY     COLONOSCOPY     around 2017. [place]    CYST EXCISION     from back 2015. likely this was meaning of ear cyst   EAR CYST EXCISION  Bilateral 01/18/2013   Procedure: EXCISION CYSTS FROM BACK (TWO);  Surgeon: Earnstine Regal, MD;  Location: [place];  Service: General;  Laterality: Bilateral;   epidydmal cyst excision     ESOPHAGOGASTRODUODENOSCOPY     [place] around 2017   [place]  2005/2007   SKIN CANCER EXCISION  2020   ear, squamous cell   TONSILLECTOMY      There were no vitals filed for this visit.   Subjective Assessment - 04/19/21 0944     Subjective COVID-19 screen performed prior to patient entering clinic. Reports minimal pain this morning but after activity he reports soreness.    Pertinent History H/o TIA's, h/o spinal pain, OA and ankle surgery, bilateral TKA's, h/o TIA's, UE tremor    Patient Stated Goals Use left UE without pain.    Currently in Pain? Yes    Pain Score 2     Pain Location Shoulder    Pain Orientation Left    Pain Descriptors / Indicators Sore    Pain Type Chronic pain    Pain Onset More than a month ago    Pain Frequency Intermittent                [facility] PT Assessment - 04/19/21 0001  Assessment   Medical Diagnosis Left shoulder pain.    Referring Provider (PT) Alexanzandrew Dara Lords PA-C      Precautions   Precautions None      Restrictions   Weight Bearing Restrictions No                           [facility] Adult PT Treatment/Exercise - 04/19/21 0001       Modalities   Modalities Electrical Stimulation;Moist Heat;Ultrasound      Moist Heat Therapy   Number Minutes Moist Heat 15 Minutes    Moist Heat Location Shoulder      Electrical Stimulation   Electrical Stimulation Location L posterior cuff/ UT    Electrical Stimulation Action Pre-Mod    Electrical Stimulation Parameters 80-150 hz x15 min    Electrical Stimulation Goals Pain      Ultrasound   Ultrasound Location L UT/posterior cuff    Ultrasound Parameters Combo 1.5 w/cm2, 100%, 1 mhz x10 min    Ultrasound Goals Pain      Manual  Therapy   Manual Therapy Soft tissue mobilization    Soft tissue mobilization STW/IASTW to L UT, posterior cuff to reduce tightness and pain                          PT Long Term Goals - 04/11/21 1557       PT LONG TERM GOAL #1   Title Independent with a HEP.    Time 6    Period Weeks    Status New      PT LONG TERM GOAL #2   Title Active left shoulder flexion to 150 degrees so the patient can easily reach overhead.    Time 6    Period Weeks    Status New      PT LONG TERM GOAL #3   Title Increase left shoulder ER strength to a solid 4+/5 to increase stability for performance of functional activities.    Period Weeks    Status New      PT LONG TERM GOAL #4   Title Perform ADL's with left shoulder pain not > 3-4/10.    Time 6    Period Weeks    Status New                   Plan - 04/19/21 1133     Clinical Impression Statement Patient presented in clinic with reports of intermittant pain of L shoulder especially with activity. Increased redness noted after IASTW to L UT and posterior cuff. Patient did not report any soreness during treatment. Normal modalities response noted following removal of the modalities.    Personal Factors and Comorbidities Comorbidity 1;Comorbidity 2;Other    Comorbidities H/o TIA's, h/o spinal pain, OA and ankle surgery, bilateral TKA's, h/o TIA's, UE tremor    Examination-Activity Limitations Other;Reach Overhead    Examination-Participation Restrictions Other;Yard Work    Merchant navy officer Evolving/Moderate complexity    Rehab Potential Good    PT Frequency 2x / week    PT Duration 6 weeks    PT Treatment/Interventions ADLs/Self Care Home Management;Cryotherapy;Electrical Stimulation;Moist Heat;Ultrasound;Therapeutic exercise;Therapeutic activities;Patient/family education;Manual techniques;Passive range of motion;Dry needling;Vasopneumatic Device;Joint Manipulations    PT Next Visit Plan Modalities  and STW/M, PROM to patient's left shoulder.  PRE's.    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 [address] [place], [place], [address] Phone: [phone]   Fax:  [phone]  Name: [name] MRN: [id number] Date of Birth: 1945/10/22

## 2021-04-22 ENCOUNTER — Ambulatory Visit: Payer: No Typology Code available for payment source | Admitting: *Deleted

## 2021-04-22 ENCOUNTER — Other Ambulatory Visit: Payer: Self-pay

## 2021-04-22 DIAGNOSIS — G8929 Other chronic pain: Secondary | ICD-10-CM

## 2021-04-22 DIAGNOSIS — M25612 Stiffness of left shoulder, not elsewhere classified: Secondary | ICD-10-CM

## 2021-04-22 DIAGNOSIS — M6281 Muscle weakness (generalized): Secondary | ICD-10-CM

## 2021-04-22 DIAGNOSIS — M25512 Pain in left shoulder: Secondary | ICD-10-CM | POA: Diagnosis not present

## 2021-04-22 NOTE — Therapy (Signed)
[facility] [place], [place], [address] Phone: [phone]   Fax:  [phone]  Physical Therapy Treatment  Patient Details  Name: [name] MRN: [id number] Date of Birth: Feb 21, 1946 Referring Provider (PT): Lawerance Sabal PA-C   Encounter Date: 04/22/2021   PT End of Session - 04/22/21 0944     Visit Number 4    Number of Visits 12    Date for PT Re-Evaluation 05/23/21    Authorization Time Period no modalities after 2 weeks   04-25-21    PT Start Time 0945    PT Stop Time 6440    PT Time Calculation (min) 50 min    Activity Tolerance Patient tolerated treatment well             Past Medical History:  Diagnosis Date   ALLERGIC RHINITIS CAUSE UNSPECIFIED 05/27/2010   Arthritis    BACK PAIN, UPPER 02/21/2010   Blood transfusion without reported diagnosis    with knee replacement   Cervicalgia 05/27/2010   CHEST PAIN UNSPECIFIED 04/25/2009   DIVERTICULOSIS, COLON 03/09/2007   DIZZINESS, CHRONIC 03/09/2007   GERD 12/24/2006   HEADACHE, SINUS 04/06/2009   History of colon polyps    HYPERLIPIDEMIA 12/24/2006   diet controlled   HYPERTENSION 12/24/2006   better after weight loss   INSOMNIA WITH SLEEP APNEA UNSPECIFIED 05/27/2010   Leukocytosis 12/08/2019   Motion sickness    Neuromuscular disorder ([place])    tremor   Neuropathy    Open wound of wrist, complicated 3/47/4259   OSTEOARTHRITIS 12/24/2006   Other constipation 11/06/2009   Pneumonia    PONV (postoperative nausea and vomiting)    PROSTATITIS, RECURRENT 12/30/2007   SHOULDER PAIN, BILATERAL 07/16/2007   Sleep apnea    Squamous cell carcinoma, ear, left    TRANSIENT ISCHEMIC ATTACKS, HX OF 04/06/2009    Past Surgical History:  Procedure Laterality Date   ANKLE ARTHROSCOPY W/ OPEN REPAIR  2002   Repair   COLONOSCOPY     COLONOSCOPY     around 2017. [place]    CYST EXCISION     from back 2015. likely this was meaning of ear cyst   EAR CYST  EXCISION Bilateral 01/18/2013   Procedure: EXCISION CYSTS FROM BACK (TWO);  Surgeon: Earnstine Regal, MD;  Location: [place];  Service: General;  Laterality: Bilateral;   epidydmal cyst excision     ESOPHAGOGASTRODUODENOSCOPY     [place] around 2017   [place]  2005/2007   SKIN CANCER EXCISION  2020   ear, squamous cell   TONSILLECTOMY      There were no vitals filed for this visit.   Subjective Assessment - 04/22/21 0941     Subjective COVID-19 screen performed prior to patient entering clinic. Did okay after last Rx    Pertinent History H/o TIA's, h/o spinal pain, OA and ankle surgery, bilateral TKA's, h/o TIA's, UE tremor    Patient Stated Goals Use left UE without pain.    Currently in Pain? Yes    Pain Score 2     Pain Location Shoulder    Pain Orientation Left    Pain Descriptors / Indicators Sore    Pain Type Chronic pain    Pain Onset More than a month ago                               [facility]  Adult PT Treatment/Exercise - 04/22/21 0001       Modalities   Modalities Electrical Stimulation;Moist Heat;Ultrasound      Moist Heat Therapy   Number Minutes Moist Heat 15 Minutes    Moist Heat Location Shoulder      Electrical Stimulation   Electrical Stimulation Location L posterior cuff/ UT    Electrical Stimulation Action premod    Electrical Stimulation Parameters 80-150hz  x 15 mins    Electrical Stimulation Goals Pain      Ultrasound   Ultrasound Location posterior cuff/sub-acromial    Ultrasound Parameters combo 1.5 w/cm2 x 12 mins    Ultrasound Goals Pain      Manual Therapy   Manual Therapy Soft tissue mobilization    Soft tissue mobilization STW/IASTW to L UT, posterior cuff, sub-acromial to reduce tightness and pain                          PT Long Term Goals - 04/11/21 1557       PT LONG TERM GOAL #1   Title Independent with a HEP.    Time 6    Period Weeks    Status New       PT LONG TERM GOAL #2   Title Active left shoulder flexion to 150 degrees so the patient can easily reach overhead.    Time 6    Period Weeks    Status New      PT LONG TERM GOAL #3   Title Increase left shoulder ER strength to a solid 4+/5 to increase stability for performance of functional activities.    Period Weeks    Status New      PT LONG TERM GOAL #4   Title Perform ADL's with left shoulder pain not > 3-4/10.    Time 6    Period Weeks    Status New                   Plan - 04/22/21 1112     Clinical Impression Statement Pt arrived today doing a little better after last Rx and can sleep on LT side longer now. Rx focused on posterior- cuff area with soreness noted into sub-acromial area. Pt reports decrreased pain end of session. Start RW 4 next Rx.    Personal Factors and Comorbidities Comorbidity 1;Comorbidity 2;Other    Comorbidities H/o TIA's, h/o spinal pain, OA and ankle surgery, bilateral TKA's, h/o TIA's, UE tremor    Examination-Participation Restrictions Other;Yard Work    [employer] officer Evolving/Moderate complexity    Rehab Potential Good    PT Frequency 2x / week    PT Duration 6 weeks    PT Treatment/Interventions ADLs/Self Care Home Management;Cryotherapy;Electrical Stimulation;Moist Heat;Ultrasound;Therapeutic exercise;Therapeutic activities;Patient/family education;Manual techniques;Passive range of motion;Dry needling;Vasopneumatic Device;Joint Manipulations    PT Next Visit Plan No modalities after 10-27 -22 as per VA ins.          PROM to patient's left shoulder.  PRE's.    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 [place], [place], [address] Phone: [phone]   Fax:  [phone]  Name: [name] MRN: [id number] Date of Birth: 04-08-1946

## 2021-04-29 ENCOUNTER — Other Ambulatory Visit: Payer: Self-pay

## 2021-04-29 ENCOUNTER — Ambulatory Visit: Payer: No Typology Code available for payment source | Admitting: Physical Therapy

## 2021-04-29 DIAGNOSIS — M6281 Muscle weakness (generalized): Secondary | ICD-10-CM

## 2021-04-29 DIAGNOSIS — G8929 Other chronic pain: Secondary | ICD-10-CM

## 2021-04-29 DIAGNOSIS — M25612 Stiffness of left shoulder, not elsewhere classified: Secondary | ICD-10-CM

## 2021-04-29 DIAGNOSIS — M25512 Pain in left shoulder: Secondary | ICD-10-CM | POA: Diagnosis not present

## 2021-04-29 NOTE — Therapy (Signed)
[facility] [place], [place], [address] Phone: [phone]   Fax:  [phone]  Physical Therapy Treatment  Patient Details  Name: [name] MRN: [id number] Date of Birth: 04/29/1946 Referring Provider (PT): Lawerance Sabal PA-C   Encounter Date: 04/29/2021   PT End of Session - 04/29/21 0944     Visit Number 5    Number of Visits 12    Date for PT Re-Evaluation 05/23/21    PT Start Time 0900    PT Stop Time 0949    PT Time Calculation (min) 49 min    Activity Tolerance Patient tolerated treatment well    Behavior During Therapy [facility] for tasks assessed/performed             Past Medical History:  Diagnosis Date   ALLERGIC RHINITIS CAUSE UNSPECIFIED 05/27/2010   Arthritis    BACK PAIN, UPPER 02/21/2010   Blood transfusion without reported diagnosis    with knee replacement   Cervicalgia 05/27/2010   CHEST PAIN UNSPECIFIED 04/25/2009   DIVERTICULOSIS, COLON 03/09/2007   DIZZINESS, CHRONIC 03/09/2007   GERD 12/24/2006   HEADACHE, SINUS 04/06/2009   History of colon polyps    HYPERLIPIDEMIA 12/24/2006   diet controlled   HYPERTENSION 12/24/2006   better after weight loss   INSOMNIA WITH SLEEP APNEA UNSPECIFIED 05/27/2010   Leukocytosis 12/08/2019   Motion sickness    Neuromuscular disorder ([place])    tremor   Neuropathy    Open wound of wrist, complicated 12/17/5091   OSTEOARTHRITIS 12/24/2006   Other constipation 11/06/2009   Pneumonia    PONV (postoperative nausea and vomiting)    PROSTATITIS, RECURRENT 12/30/2007   SHOULDER PAIN, BILATERAL 07/16/2007   Sleep apnea    Squamous cell carcinoma, ear, left    TRANSIENT ISCHEMIC ATTACKS, HX OF 04/06/2009    Past Surgical History:  Procedure Laterality Date   ANKLE ARTHROSCOPY W/ OPEN REPAIR  2002   Repair   COLONOSCOPY     COLONOSCOPY     around 2017. [place]    CYST EXCISION     from back 2015. likely this was meaning of ear cyst   EAR CYST EXCISION  Bilateral 01/18/2013   Procedure: EXCISION CYSTS FROM BACK (TWO);  Surgeon: Earnstine Regal, MD;  Location: [place];  Service: General;  Laterality: Bilateral;   epidydmal cyst excision     ESOPHAGOGASTRODUODENOSCOPY     [place] around 2017   [place]  2005/2007   SKIN CANCER EXCISION  2020   ear, squamous cell   TONSILLECTOMY      There were no vitals filed for this visit.   Subjective Assessment - 04/29/21 0944     Subjective COVID-19 screen performed prior to patient entering clinic.  No new complaints.    Pertinent History H/o TIA's, h/o spinal pain, OA and ankle surgery, bilateral TKA's, h/o TIA's, UE tremor    Currently in Pain? Yes    Pain Score 2     Pain Location Shoulder    Pain Orientation Left    Pain Descriptors / Indicators Sore    Pain Onset More than a month ago                               [facility] Adult PT Treatment/Exercise - 04/29/21 0001       Exercises   Exercises Shoulder      Shoulder  Exercises: ROM/Strengthening   UBE (Upper Arm Bike) 6 minutes at 120 RPM's.      Modalities   Modalities Electrical Stimulation      Moist Heat Therapy   Number Minutes Moist Heat 20 Minutes    Moist Heat Location --   Left shoulder.     Electrical Stimulation   Electrical Stimulation Location Left UT/Supraspinatus and post cuff.    Electrical Stimulation Action IFC at 80-150 Hz.    Electrical Stimulation Parameters 40% scan x 20 minutes.    Electrical Stimulation Goals Pain;Tone      Manual Therapy   Manual Therapy Soft tissue mobilization    Soft tissue mobilization STW/M x 18 minutes to reduce tone.                          PT Long Term Goals - 04/11/21 1557       PT LONG TERM GOAL #1   Title Independent with a HEP.    Time 6    Period Weeks    Status New      PT LONG TERM GOAL #2   Title Active left shoulder flexion to 150 degrees so the patient can easily reach overhead.     Time 6    Period Weeks    Status New      PT LONG TERM GOAL #3   Title Increase left shoulder ER strength to a solid 4+/5 to increase stability for performance of functional activities.    Period Weeks    Status New      PT LONG TERM GOAL #4   Title Perform ADL's with left shoulder pain not > 3-4/10.    Time 6    Period Weeks    Status New                   Plan - 04/29/21 0953     Clinical Impression Statement The patient is doing well.  His has some remaining soreness but there was a redcution in tone in the affected right shoulder muscualture.    Personal Factors and Comorbidities Comorbidity 1;Comorbidity 2;Other    Comorbidities H/o TIA's, h/o spinal pain, OA and ankle surgery, bilateral TKA's, h/o TIA's, UE tremor    Examination-Activity Limitations Other;Reach Overhead    Examination-Participation Restrictions Other;Yard Work    [employer]chant [employer] officer Evolving/Moderate complexity    Rehab Potential Good    PT Frequency 2x / week    PT Duration 6 weeks    PT Treatment/Interventions ADLs/Self Care Home Management;Cryotherapy;Electrical Stimulation;Moist Heat;Ultrasound;Therapeutic exercise;Therapeutic activities;Patient/family education;Manual techniques;Passive range of motion;Dry needling;Vasopneumatic Device;Joint Manipulations    PT Next Visit Plan No modalities after 10-27 -22 as per VA ins.          PROM to patient's left shoulder.  PRE's.    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   Tremor 04/10/2014   Elevated hemoglobin A1c 04/10/2014   Solitary pulmonary nodule 04/10/2014  Constipation, slow transit 08/14/2010   Allergic rhinitis 05/27/2010   Cervicalgia 05/27/2010   Insomnia with sleep apnea 05/27/2010   HEADACHE, SINUS 04/06/2009   SHOULDER PAIN, BILATERAL 07/16/2007   DIZZINESS, CHRONIC 03/09/2007   Hyperlipidemia 12/24/2006   Essential hypertension 12/24/2006   GERD 12/24/2006   Osteoarthritis 12/24/2006    Jeneane Pieczynski, [place], PT 04/29/2021, 10:23 AM  [facility] [address] [place], [place], [address] Phone: [phone]   Fax:  [phone]  Name: [name] MRN: [id number] Date of Birth: 09-Oct-1945

## 2021-05-02 ENCOUNTER — Other Ambulatory Visit: Payer: Self-pay

## 2021-05-02 ENCOUNTER — Ambulatory Visit: Payer: No Typology Code available for payment source | Attending: Orthopedic Surgery | Admitting: *Deleted

## 2021-05-02 DIAGNOSIS — G8929 Other chronic pain: Secondary | ICD-10-CM | POA: Insufficient documentation

## 2021-05-02 DIAGNOSIS — M6281 Muscle weakness (generalized): Secondary | ICD-10-CM | POA: Diagnosis present

## 2021-05-02 DIAGNOSIS — M25612 Stiffness of left shoulder, not elsewhere classified: Secondary | ICD-10-CM | POA: Diagnosis present

## 2021-05-02 DIAGNOSIS — M25512 Pain in left shoulder: Secondary | ICD-10-CM | POA: Insufficient documentation

## 2021-05-02 NOTE — Therapy (Signed)
[facility] [place], [place], [address] Phone: [phone]   Fax:  [phone]  Physical Therapy Treatment  Patient Details  Name: [name] MRN: [id number] Date of Birth: 04-Nov-1945 Referring Provider (PT): Lawerance Sabal PA-C   Encounter Date: 05/02/2021   PT End of Session - 05/02/21 0902     Visit Number 6    Number of Visits 12    Date for PT Re-Evaluation 05/23/21    Authorization Time Period no modalities after 2 weeks   04-25-21    PT Start Time 0900    PT Stop Time 1443    PT Time Calculation (min) 49 min    Activity Tolerance Patient tolerated treatment well             Past Medical History:  Diagnosis Date   ALLERGIC RHINITIS CAUSE UNSPECIFIED 05/27/2010   Arthritis    BACK PAIN, UPPER 02/21/2010   Blood transfusion without reported diagnosis    with knee replacement   Cervicalgia 05/27/2010   CHEST PAIN UNSPECIFIED 04/25/2009   DIVERTICULOSIS, COLON 03/09/2007   DIZZINESS, CHRONIC 03/09/2007   GERD 12/24/2006   HEADACHE, SINUS 04/06/2009   History of colon polyps    HYPERLIPIDEMIA 12/24/2006   diet controlled   HYPERTENSION 12/24/2006   better after weight loss   INSOMNIA WITH SLEEP APNEA UNSPECIFIED 05/27/2010   Leukocytosis 12/08/2019   Motion sickness    Neuromuscular disorder ([place])    tremor   Neuropathy    Open wound of wrist, complicated 1/54/0086   OSTEOARTHRITIS 12/24/2006   Other constipation 11/06/2009   Pneumonia    PONV (postoperative nausea and vomiting)    PROSTATITIS, RECURRENT 12/30/2007   SHOULDER PAIN, BILATERAL 07/16/2007   Sleep apnea    Squamous cell carcinoma, ear, left    TRANSIENT ISCHEMIC ATTACKS, HX OF 04/06/2009    Past Surgical History:  Procedure Laterality Date   ANKLE ARTHROSCOPY W/ OPEN REPAIR  2002   Repair   COLONOSCOPY     COLONOSCOPY     around 2017. [place]    CYST EXCISION     from back 2015. likely this was meaning of ear cyst   EAR CYST  EXCISION Bilateral 01/18/2013   Procedure: EXCISION CYSTS FROM BACK (TWO);  Surgeon: Earnstine Regal, MD;  Location: [place];  Service: General;  Laterality: Bilateral;   epidydmal cyst excision     ESOPHAGOGASTRODUODENOSCOPY     [place] around 2017   [place]  2005/2007   SKIN CANCER EXCISION  2020   ear, squamous cell   TONSILLECTOMY      There were no vitals filed for this visit.   Subjective Assessment - 05/02/21 0901     Subjective COVID-19 screen performed prior to patient entering clinic.  No new complaints.MRI tomorrow    Pertinent History H/o TIA's, h/o spinal pain, OA and ankle surgery, bilateral TKA's, h/o TIA's, UE tremor    Patient Stated Goals Use left UE without pain.    Currently in Pain? Yes    Pain Score 3     Pain Location Shoulder    Pain Orientation Left                               [facility] Adult PT Treatment/Exercise - 05/02/21 0001       Exercises   Exercises Shoulder      Shoulder Exercises: Standing  Protraction Strengthening;Left;20 reps   2x10   Theraband Level (Shoulder Protraction) Level 1 (Yellow)    External Rotation Strengthening;Left;20 reps    Theraband Level (Shoulder External Rotation) Level 1 (Yellow)    Internal Rotation Strengthening;Left;20 reps;Theraband    Theraband Level (Shoulder Internal Rotation) Level 1 (Yellow)    Row Strengthening;Left;20 reps;Theraband    Theraband Level (Shoulder Row) Level 1 (Yellow)      Shoulder Exercises: Pulleys   Flexion 3 minutes      Shoulder Exercises: ROM/Strengthening   UBE (Upper Arm Bike) 8 minutes at 120 RPM's.      Manual Therapy   Manual Therapy Soft tissue mobilization;Passive ROM    Soft tissue mobilization STW/IASTW to L UT, posterior cuff, sub-acromial to reduce tightness and pain    Passive ROM PROM/ AAROM ER, IR, and elevation in hooklying                          PT Long Term Goals - 04/11/21 1557        PT LONG TERM GOAL #1   Title Independent with a HEP.    Time 6    Period Weeks    Status New      PT LONG TERM GOAL #2   Title Active left shoulder flexion to 150 degrees so the patient can easily reach overhead.    Time 6    Period Weeks    Status New      PT LONG TERM GOAL #3   Title Increase left shoulder ER strength to a solid 4+/5 to increase stability for performance of functional activities.    Period Weeks    Status New      PT LONG TERM GOAL #4   Title Perform ADL's with left shoulder pain not > 3-4/10.    Time 6    Period Weeks    Status New                   Plan - 05/02/21 7591     Clinical Impression Statement when using LE UE. Rx focused on ROM, AAROM, and light RW strengthening exs with yellow tband. He did well with all exs and PROM, but most limited in elevation due to pain. He also reports that he will have an MRI of LT shldr tomorrow.    Personal Factors and Comorbidities Comorbidity 1;Comorbidity 2;Other    Comorbidities H/o TIA's, h/o spinal pain, OA and ankle surgery, bilateral TKA's, h/o TIA's, UE tremor    Stability/Clinical Decision Making Evolving/Moderate complexity    Rehab Potential Good    PT Frequency 2x / week    PT Duration 6 weeks    PT Treatment/Interventions ADLs/Self Care Home Management;Cryotherapy;Electrical Stimulation;Moist Heat;Ultrasound;Therapeutic exercise;Therapeutic activities;Patient/family education;Manual techniques;Passive range of motion;Dry needling;Vasopneumatic Device;Joint Manipulations    PT Next Visit Plan No modalities after 10-27 -22 as per VA ins.          PROM to patient's left shoulder.  PRE's.    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 [place], [place], [address] Phone: [phone]   Fax:  [phone]  Name: GERASIMOS PLOTTS MRN: [id number] Date of Birth: 05-Aug-1945

## 2021-05-06 ENCOUNTER — Other Ambulatory Visit: Payer: Self-pay

## 2021-05-06 ENCOUNTER — Ambulatory Visit: Payer: No Typology Code available for payment source | Admitting: Physical Therapy

## 2021-05-06 ENCOUNTER — Encounter: Payer: Self-pay | Admitting: Physical Therapy

## 2021-05-06 DIAGNOSIS — M6281 Muscle weakness (generalized): Secondary | ICD-10-CM

## 2021-05-06 DIAGNOSIS — M25512 Pain in left shoulder: Secondary | ICD-10-CM | POA: Diagnosis not present

## 2021-05-06 DIAGNOSIS — M25612 Stiffness of left shoulder, not elsewhere classified: Secondary | ICD-10-CM

## 2021-05-06 DIAGNOSIS — G8929 Other chronic pain: Secondary | ICD-10-CM

## 2021-05-06 NOTE — Therapy (Signed)
[facility] [place], [place], [address] Phone: [phone]   Fax:  [phone]  Physical Therapy Treatment  Patient Details  Name: [name] MRN: [id number] Date of Birth: [birth year] Referring Provider (PT): Lawerance Sabal PA-C   Encounter Date: 05/06/2021   PT End of Session - 05/06/21 0902     Visit Number 7    Number of Visits 12    Date for PT Re-Evaluation 05/23/21    Authorization Time Period no modalities after 2 weeks   04-25-21    PT Start Time 0902    PT Stop Time 0945    PT Time Calculation (min) 43 min    Activity Tolerance Patient tolerated treatment well    Behavior During Therapy [facility] for tasks assessed/performed             Past Medical History:  Diagnosis Date   ALLERGIC RHINITIS CAUSE UNSPECIFIED 05/27/2010   Arthritis    BACK PAIN, UPPER 02/21/2010   Blood transfusion without reported diagnosis    with knee replacement   Cervicalgia 05/27/2010   CHEST PAIN UNSPECIFIED 04/25/2009   DIVERTICULOSIS, COLON 03/09/2007   DIZZINESS, CHRONIC 03/09/2007   GERD 12/24/2006   HEADACHE, SINUS 04/06/2009   History of colon polyps    HYPERLIPIDEMIA 12/24/2006   diet controlled   HYPERTENSION 12/24/2006   better after weight loss   INSOMNIA WITH SLEEP APNEA UNSPECIFIED 05/27/2010   Leukocytosis 12/08/2019   Motion sickness    Neuromuscular disorder ([place])    tremor   Neuropathy    Open wound of wrist, complicated 2/42/3536   OSTEOARTHRITIS 12/24/2006   Other constipation 11/06/2009   Pneumonia    PONV (postoperative nausea and vomiting)    PROSTATITIS, RECURRENT 12/30/2007   SHOULDER PAIN, BILATERAL 07/16/2007   Sleep apnea    Squamous cell carcinoma, ear, left    TRANSIENT ISCHEMIC ATTACKS, HX OF 04/06/2009    Past Surgical History:  Procedure Laterality Date   ANKLE ARTHROSCOPY W/ OPEN REPAIR  2002   Repair   COLONOSCOPY     COLONOSCOPY     around 2017. [place]    CYST EXCISION     from  back 2015. likely this was meaning of ear cyst   EAR CYST EXCISION Bilateral 01/18/2013   Procedure: EXCISION CYSTS FROM BACK (TWO);  Surgeon: Earnstine Regal, MD;  Location: [facility];  Service: General;  Laterality: Bilateral;   epidydmal cyst excision     ESOPHAGOGASTRODUODENOSCOPY     [place] around 2017   [place]  2005/2007   SKIN CANCER EXCISION  2020   ear, squamous cell   TONSILLECTOMY      There were no vitals filed for this visit.   Subjective Assessment - 05/06/21 0901     Subjective COVID-19 screen performed prior to patient entering clinic. No results from MRI yet but shoulder feels better. More soreness.    Pertinent History H/o TIA's, h/o spinal pain, OA and ankle surgery, bilateral TKA's, h/o TIA's, UE tremor    Patient Stated Goals Use left UE without pain.    Currently in Pain? Yes    Pain Score 3     Pain Location Shoulder    Pain Orientation Left    Pain Descriptors / Indicators Sore    Pain Type Chronic pain    Pain Onset More than a month ago    Pain Frequency Intermittent  [facility] PT Assessment - 05/06/21 0001       Assessment   Medical Diagnosis Left shoulder pain.    Referring Provider (PT) Alexanzandrew Dara Lords PA-C      Precautions   Precautions None                           [facility] Adult PT Treatment/Exercise - 05/06/21 0001       Shoulder Exercises: Standing   Protraction Strengthening;Left;20 reps;10 reps    Theraband Level (Shoulder Protraction) Level 1 (Yellow)    External Rotation Strengthening;Left;20 reps;10 reps    Theraband Level (Shoulder External Rotation) Level 1 (Yellow)    Internal Rotation Strengthening;Left;20 reps;Theraband;10 reps    Theraband Level (Shoulder Internal Rotation) Level 1 (Yellow)    Flexion Strengthening;Right;20 reps;10 reps;Weights    Shoulder Flexion Weight (lbs) 2    ABduction Strengthening;Right;20 reps;Weights    Shoulder  ABduction Weight (lbs) 2    Extension Strengthening;Right;20 reps;10 reps;Theraband    Theraband Level (Shoulder Extension) Level 1 (Yellow)      Shoulder Exercises: Pulleys   Flexion 3 minutes      Shoulder Exercises: ROM/Strengthening   UBE (Upper Arm Bike) 8 minutes at 120 RPM's.      Manual Therapy   Manual Therapy Myofascial release    Myofascial Release MFR/IASTW to L posterior cuff, deltoids to reduce pain                          PT Long Term Goals - 04/11/21 1557       PT LONG TERM GOAL #1   Title Independent with a HEP.    Time 6    Period Weeks    Status New      PT LONG TERM GOAL #2   Title Active left shoulder flexion to 150 degrees so the patient can easily reach overhead.    Time 6    Period Weeks    Status New      PT LONG TERM GOAL #3   Title Increase left shoulder ER strength to a solid 4+/5 to increase stability for performance of functional activities.    Period Weeks    Status New      PT LONG TERM GOAL #4   Title Perform ADL's with left shoulder pain not > 3-4/10.    Time 6    Period Weeks    Status New                   Plan - 05/06/21 1026     Clinical Impression Statement Patient presented in clinic with reports of pain with activity especially with prolonged activity. Patient demonstrated muscle fatigue with all therex especially with increased reps and resistance. Appropriate redness response to IASTW of R posterior shoulder and deltoids.    Personal Factors and Comorbidities Comorbidity 1;Comorbidity 2;Other    Comorbidities H/o TIA's, h/o spinal pain, OA and ankle surgery, bilateral TKA's, h/o TIA's, UE tremor    Examination-Activity Limitations Other;Reach Overhead    Examination-Participation Restrictions Other;Yard Work    [employer] officer Evolving/Moderate complexity    Rehab Potential Good    PT Frequency 2x / week    PT Duration 6 weeks    PT Treatment/Interventions ADLs/Self Care Home  Management;Cryotherapy;Electrical Stimulation;Moist Heat;Ultrasound;Therapeutic exercise;Therapeutic activities;Patient/family education;Manual techniques;Passive range of motion;Dry needling;Vasopneumatic Device;Joint Manipulations    PT Next Visit Plan No modalities after 10-27 -22 as per  VA ins.          PROM to patient's left shoulder.  PRE's.    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   Tremor 04/10/2014   Elevated hemoglobin A1c 04/10/2014   Solitary pulmonary nodule 04/10/2014   Constipation, slow transit 08/14/2010   Allergic rhinitis 05/27/2010   Cervicalgia 05/27/2010   Insomnia with sleep apnea 05/27/2010   HEADACHE, SINUS 04/06/2009   SHOULDER PAIN, BILATERAL 07/16/2007   DIZZINESS, CHRONIC 03/09/2007   Hyperlipidemia 12/24/2006   Essential hypertension 12/24/2006   GERD 12/24/2006   Osteoarthritis 12/24/2006    Standley Brooking, PTA 05/06/2021, 10:29 AM  [facility] [address] [place], [place], [address] Phone: [phone]   Fax:  [phone]  Name: [name] MRN: [id number] Date of Birth: 13-Jun-1946

## 2021-05-09 ENCOUNTER — Other Ambulatory Visit: Payer: Self-pay

## 2021-05-09 ENCOUNTER — Encounter: Payer: Self-pay | Admitting: Physical Therapy

## 2021-05-09 ENCOUNTER — Ambulatory Visit: Payer: No Typology Code available for payment source | Admitting: Physical Therapy

## 2021-05-09 DIAGNOSIS — M6281 Muscle weakness (generalized): Secondary | ICD-10-CM

## 2021-05-09 DIAGNOSIS — M25512 Pain in left shoulder: Secondary | ICD-10-CM | POA: Diagnosis not present

## 2021-05-09 DIAGNOSIS — M25612 Stiffness of left shoulder, not elsewhere classified: Secondary | ICD-10-CM

## 2021-05-09 DIAGNOSIS — G8929 Other chronic pain: Secondary | ICD-10-CM

## 2021-05-09 NOTE — Therapy (Signed)
[facility] [place], [place], [address] Phone: [phone]   Fax:  [phone]  Physical Therapy Treatment  Patient Details  Name: [name] MRN: [id number] Date of Birth: 02/25/1946 Referring Provider (PT): Lawerance Sabal PA-C   Encounter Date: 05/09/2021   PT End of Session - 05/09/21 1018     Visit Number 9    Number of Visits 12    Authorization Time Period no modalities after 2 weeks   04-25-21    PT Start Time 0900    PT Stop Time 0932    PT Time Calculation (min) 44 min    Activity Tolerance Patient tolerated treatment well    Behavior During Therapy [facility] for tasks assessed/performed             Past Medical History:  Diagnosis Date   ALLERGIC RHINITIS CAUSE UNSPECIFIED 05/27/2010   Arthritis    BACK PAIN, UPPER 02/21/2010   Blood transfusion without reported diagnosis    with knee replacement   Cervicalgia 05/27/2010   CHEST PAIN UNSPECIFIED 04/25/2009   DIVERTICULOSIS, COLON 03/09/2007   DIZZINESS, CHRONIC 03/09/2007   GERD 12/24/2006   HEADACHE, SINUS 04/06/2009   History of colon polyps    HYPERLIPIDEMIA 12/24/2006   diet controlled   HYPERTENSION 12/24/2006   better after weight loss   INSOMNIA WITH SLEEP APNEA UNSPECIFIED 05/27/2010   Leukocytosis 12/08/2019   Motion sickness    Neuromuscular disorder ([place])    tremor   Neuropathy    Open wound of wrist, complicated 6/71/2458   OSTEOARTHRITIS 12/24/2006   Other constipation 11/06/2009   Pneumonia    PONV (postoperative nausea and vomiting)    PROSTATITIS, RECURRENT 12/30/2007   SHOULDER PAIN, BILATERAL 07/16/2007   Sleep apnea    Squamous cell carcinoma, ear, left    TRANSIENT ISCHEMIC ATTACKS, HX OF 04/06/2009    Past Surgical History:  Procedure Laterality Date   ANKLE ARTHROSCOPY W/ OPEN REPAIR  2002   Repair   COLONOSCOPY     COLONOSCOPY     around 2017. [place]    CYST EXCISION     from back 2015. likely this was meaning of  ear cyst   EAR CYST EXCISION Bilateral 01/18/2013   Procedure: EXCISION CYSTS FROM BACK (TWO);  Surgeon: Earnstine Regal, MD;  Location: [facility];  Service: General;  Laterality: Bilateral;   epidydmal cyst excision     ESOPHAGOGASTRODUODENOSCOPY     [place] around 2017   [place]  2005/2007   SKIN CANCER EXCISION  2020   ear, squamous cell   TONSILLECTOMY      There were no vitals filed for this visit.   Subjective Assessment - 05/09/21 0909     Subjective COVID-19 screen performed prior to patient entering clinic.  No new complaints.    Pertinent History H/o TIA's, h/o spinal pain, OA and ankle surgery, bilateral TKA's, h/o TIA's, UE tremor    Patient Stated Goals Use left UE without pain.    Currently in Pain? Yes    Pain Score 3     Pain Location Shoulder    Pain Orientation Left    Pain Descriptors / Indicators Sore    Pain Type Chronic pain    Pain Onset More than a month ago  [facility] Adult PT Treatment/Exercise - 05/09/21 0001       Exercises   Exercises Shoulder      Shoulder Exercises: Pulleys   Flexion 5 minutes      Shoulder Exercises: ROM/Strengthening   UBE (Upper Arm Bike) 10 minutes at 90 RPM's.      Manual Therapy   Manual Therapy Soft tissue mobilization    Soft tissue mobilization STW/M x 23 minutes to patient left UT region and posterior cuff musculatur ewith ischemic release technique utililized to decrease tone and pain.                          PT Long Term Goals - 04/11/21 1557       PT LONG TERM GOAL #1   Title Independent with a HEP.    Time 6    Period Weeks    Status New      PT LONG TERM GOAL #2   Title Active left shoulder flexion to 150 degrees so the patient can easily reach overhead.    Time 6    Period Weeks    Status New      PT LONG TERM GOAL #3   Title Increase left shoulder ER strength to a solid 4+/5 to increase  stability for performance of functional activities.    Period Weeks    Status New      PT LONG TERM GOAL #4   Title Perform ADL's with left shoulder pain not > 3-4/10.    Time 6    Period Weeks    Status New                   Plan - 05/09/21 1020     Clinical Impression Statement Patient with increased tone in his left posterior cuff musculature.  He feels treatments are helping but his shoulder remains sore and painful to lie on his left side.    Personal Factors and Comorbidities Comorbidity 1;Comorbidity 2;Other    Comorbidities H/o TIA's, h/o spinal pain, OA and ankle surgery, bilateral TKA's, h/o TIA's, UE tremor    Examination-Activity Limitations Other;Reach Overhead    Examination-Participation Restrictions Other;Yard Work    [employer] officer Evolving/Moderate complexity    PT Frequency 2x / week    PT Duration 6 weeks    PT Treatment/Interventions ADLs/Self Care Home Management;Cryotherapy;Electrical Stimulation;Moist Heat;Ultrasound;Therapeutic exercise;Therapeutic activities;Patient/family education;Manual techniques;Passive range of motion;Dry needling;Vasopneumatic Device;Joint Manipulations    PT Next Visit Plan No modalities after 10-27 -22 as per VA ins.          PROM to patient's left shoulder.  PRE's.             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   Tremor 04/10/2014   Elevated hemoglobin A1c 04/10/2014   Solitary pulmonary nodule 04/10/2014   Constipation, slow transit 08/14/2010    Allergic rhinitis 05/27/2010   Cervicalgia 05/27/2010   Insomnia with sleep apnea 05/27/2010   HEADACHE, SINUS 04/06/2009   SHOULDER PAIN, BILATERAL 07/16/2007   DIZZINESS, CHRONIC 03/09/2007   Hyperlipidemia 12/24/2006  Essential hypertension 12/24/2006   GERD 12/24/2006   Osteoarthritis 12/24/2006    Kailei Cowens, [place], PT 05/09/2021, 10:24 AM  [facility] [place], [place], [address] Phone: [phone]   Fax:  [phone]  Name: [name] MRN: [id number] Date of Birth: Sep 15, 1945

## 2021-05-13 ENCOUNTER — Other Ambulatory Visit: Payer: Self-pay

## 2021-05-13 ENCOUNTER — Encounter: Payer: Self-pay | Admitting: Physical Therapy

## 2021-05-13 ENCOUNTER — Ambulatory Visit: Payer: No Typology Code available for payment source | Admitting: Physical Therapy

## 2021-05-13 DIAGNOSIS — M25512 Pain in left shoulder: Secondary | ICD-10-CM

## 2021-05-13 DIAGNOSIS — M25612 Stiffness of left shoulder, not elsewhere classified: Secondary | ICD-10-CM

## 2021-05-13 DIAGNOSIS — M6281 Muscle weakness (generalized): Secondary | ICD-10-CM

## 2021-05-13 DIAGNOSIS — G8929 Other chronic pain: Secondary | ICD-10-CM

## 2021-05-13 NOTE — Therapy (Signed)
[facility] [place], [place], [address] Phone: [phone]   Fax:  [phone]  Physical Therapy Treatment  Patient Details  Name: [name] MRN: [id number] Date of Birth: 09-29-1945 Referring Provider (PT): Lawerance Sabal PA-C   Encounter Date: 05/13/2021   PT End of Session - 05/13/21 0948     Visit Number 9    Number of Visits 12    Date for PT Re-Evaluation 05/23/21    Authorization Time Period no modalities after 2 weeks   04-25-21    PT Start Time 0947    PT Stop Time 1030    PT Time Calculation (min) 43 min    Activity Tolerance Patient tolerated treatment well    Behavior During Therapy [facility] for tasks assessed/performed             Past Medical History:  Diagnosis Date   ALLERGIC RHINITIS CAUSE UNSPECIFIED 05/27/2010   Arthritis    BACK PAIN, UPPER 02/21/2010   Blood transfusion without reported diagnosis    with knee replacement   Cervicalgia 05/27/2010   CHEST PAIN UNSPECIFIED 04/25/2009   DIVERTICULOSIS, COLON 03/09/2007   DIZZINESS, CHRONIC 03/09/2007   GERD 12/24/2006   HEADACHE, SINUS 04/06/2009   History of colon polyps    HYPERLIPIDEMIA 12/24/2006   diet controlled   HYPERTENSION 12/24/2006   better after weight loss   INSOMNIA WITH SLEEP APNEA UNSPECIFIED 05/27/2010   Leukocytosis 12/08/2019   Motion sickness    Neuromuscular disorder ([place])    tremor   Neuropathy    Open wound of wrist, complicated 1/76/1607   OSTEOARTHRITIS 12/24/2006   Other constipation 11/06/2009   Pneumonia    PONV (postoperative nausea and vomiting)    PROSTATITIS, RECURRENT 12/30/2007   SHOULDER PAIN, BILATERAL 07/16/2007   Sleep apnea    Squamous cell carcinoma, ear, left    TRANSIENT ISCHEMIC ATTACKS, HX OF 04/06/2009    Past Surgical History:  Procedure Laterality Date   ANKLE ARTHROSCOPY W/ OPEN REPAIR  2002   Repair   COLONOSCOPY     COLONOSCOPY     around 2017. [place]    CYST EXCISION      from back 2015. likely this was meaning of ear cyst   EAR CYST EXCISION Bilateral 01/18/2013   Procedure: EXCISION CYSTS FROM BACK (TWO);  Surgeon: Earnstine Regal, MD;  Location: [facility];  Service: General;  Laterality: Bilateral;   epidydmal cyst excision     ESOPHAGOGASTRODUODENOSCOPY     [place] around 2017   [place]  2005/2007   SKIN CANCER EXCISION  2020   ear, squamous cell   TONSILLECTOMY      There were no vitals filed for this visit.   Subjective Assessment - 05/13/21 0947     Subjective COVID-19 screen performed prior to patient entering clinic.  No new complaints.    Pertinent History H/o TIA's, h/o spinal pain, OA and ankle surgery, bilateral TKA's, h/o TIA's, UE tremor    Patient Stated Goals Use left UE without pain.    Currently in Pain? No/denies                [facility] PT Assessment - 05/13/21 0001       Assessment   Medical Diagnosis Left shoulder pain.    Referring Provider (PT) Alexanzandrew Dara Lords PA-C      Precautions   Precautions None      Restrictions   Weight Bearing Restrictions No  ROM / Strength   AROM / PROM / Strength AROM      AROM   Overall AROM  Within functional limits for tasks performed    AROM Assessment Site Shoulder    Right/Left Shoulder Left    Left Shoulder Flexion 130 Degrees                           [facility] Adult PT Treatment/Exercise - 05/13/21 0001       Shoulder Exercises: Standing   Protraction Strengthening;Left;20 reps;10 reps    Theraband Level (Shoulder Protraction) Level 2 (Red)    External Rotation Strengthening;20 reps;Theraband;Left    Theraband Level (Shoulder External Rotation) Level 2 (Red)    Internal Rotation Strengthening;20 reps;Theraband;Left    Theraband Level (Shoulder Internal Rotation) Level 2 (Red)    Extension Strengthening;20 reps;Theraband;Left    Theraband Level (Shoulder Extension) Level 2 (Red)    Other Standing  Exercises LUE Er roll up with red ball x15 reps      Shoulder Exercises: Pulleys   Flexion 3 minutes      Shoulder Exercises: ROM/Strengthening   UBE (Upper Arm Bike) 60 RPM x8 min    Wall Wash :UE CW and CCw circles x fatigue    Wall Pushups 15 reps      Manual Therapy   Manual Therapy Passive ROM    Passive ROM PROM of L shoulder into flex, ER, IR with holds at end range                          PT Long Term Goals - 05/13/21 2671       PT LONG TERM GOAL #1   Title Independent with a HEP.    Time 6    Period Weeks    Status On-going      PT LONG TERM GOAL #2   Title Active left shoulder flexion to 150 degrees so the patient can easily reach overhead.    Time 6    Period Weeks    Status On-going      PT LONG TERM GOAL #3   Title Increase left shoulder ER strength to a solid 4+/5 to increase stability for performance of functional activities.    Period Weeks    Status On-going      PT LONG TERM GOAL #4   Title Perform ADL's with left shoulder pain not > 3-4/10.    Time 6    Period Weeks    Status On-going                   Plan - 05/13/21 1111     Clinical Impression Statement Patient presented in clinic with no current L shoulder pain. Patient able to tolerate therex well but AROM flexion is a struggle. 130 deg flexion was more difficult in antigravity especially towards end range. PROM completed as well to assist with increasing ROM.    Personal Factors and Comorbidities Comorbidity 1;Comorbidity 2;Other    Comorbidities H/o TIA's, h/o spinal pain, OA and ankle surgery, bilateral TKA's, h/o TIA's, UE tremor    Examination-Activity Limitations Other;Reach Overhead    Examination-Participation Restrictions Other;Yard Work    [employer]chant [employer] officer Evolving/Moderate complexity    Rehab Potential Good    PT Frequency 2x / week    PT Duration 6 weeks    PT Treatment/Interventions ADLs/Self Care Home  Management;Cryotherapy;Electrical Stimulation;Moist Heat;Ultrasound;Therapeutic exercise;Therapeutic activities;Patient/family education;Manual techniques;Passive  range of motion;Dry needling;Vasopneumatic Device;Joint Manipulations    PT Next Visit Plan No modalities after 10-27 -22 as per VA ins.          PROM to patient's left shoulder.  PRE's.    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   Tremor 04/10/2014   Elevated hemoglobin A1c 04/10/2014   Solitary pulmonary nodule 04/10/2014   Constipation, slow transit 08/14/2010   Allergic rhinitis 05/27/2010   Cervicalgia 05/27/2010   Insomnia with sleep apnea 05/27/2010   HEADACHE, SINUS 04/06/2009   SHOULDER PAIN, BILATERAL 07/16/2007   DIZZINESS, CHRONIC 03/09/2007   Hyperlipidemia 12/24/2006   Essential hypertension 12/24/2006   GERD 12/24/2006   Osteoarthritis 12/24/2006    Standley Brooking, PTA 05/13/2021, 11:32 AM  [facility] [address] [place], [place], [address] Phone: [phone]   Fax:  [phone]  Name: [name] MRN: [id number] Date of Birth: 12/07/1945

## 2021-05-15 DIAGNOSIS — L570 Actinic keratosis: Secondary | ICD-10-CM | POA: Diagnosis not present

## 2021-05-15 DIAGNOSIS — L298 Other pruritus: Secondary | ICD-10-CM | POA: Diagnosis not present

## 2021-05-15 DIAGNOSIS — L538 Other specified erythematous conditions: Secondary | ICD-10-CM | POA: Diagnosis not present

## 2021-05-15 DIAGNOSIS — L820 Inflamed seborrheic keratosis: Secondary | ICD-10-CM | POA: Diagnosis not present

## 2021-05-16 ENCOUNTER — Ambulatory Visit: Payer: No Typology Code available for payment source | Admitting: *Deleted

## 2021-05-16 ENCOUNTER — Other Ambulatory Visit: Payer: Self-pay

## 2021-05-16 DIAGNOSIS — M6281 Muscle weakness (generalized): Secondary | ICD-10-CM

## 2021-05-16 DIAGNOSIS — M25612 Stiffness of left shoulder, not elsewhere classified: Secondary | ICD-10-CM

## 2021-05-16 DIAGNOSIS — M25512 Pain in left shoulder: Secondary | ICD-10-CM | POA: Diagnosis not present

## 2021-05-16 DIAGNOSIS — G8929 Other chronic pain: Secondary | ICD-10-CM

## 2021-05-16 NOTE — Therapy (Signed)
[facility] [place], [place], [address] Phone: [phone]   Fax:  [phone]  Physical Therapy Treatment  Patient Details  Name: GOKU HARB MRN: [id number] Date of Birth: October 08, 1945 Referring Provider (PT): Lawerance Sabal PA-C   Encounter Date: 05/16/2021   PT End of Session - 05/16/21 0944     Visit Number 10    Number of Visits 12    Date for PT Re-Evaluation 05/23/21    Authorization Time Period no modalities after 2 weeks   04-25-21    PT Start Time 0945    PT Stop Time 1036    PT Time Calculation (min) 51 min             Past Medical History:  Diagnosis Date   ALLERGIC RHINITIS CAUSE UNSPECIFIED 05/27/2010   Arthritis    BACK PAIN, UPPER 02/21/2010   Blood transfusion without reported diagnosis    with knee replacement   Cervicalgia 05/27/2010   CHEST PAIN UNSPECIFIED 04/25/2009   DIVERTICULOSIS, COLON 03/09/2007   DIZZINESS, CHRONIC 03/09/2007   GERD 12/24/2006   HEADACHE, SINUS 04/06/2009   History of colon polyps    HYPERLIPIDEMIA 12/24/2006   diet controlled   HYPERTENSION 12/24/2006   better after weight loss   INSOMNIA WITH SLEEP APNEA UNSPECIFIED 05/27/2010   Leukocytosis 12/08/2019   Motion sickness    Neuromuscular disorder ([place])    tremor   Neuropathy    Open wound of wrist, complicated 0/93/2355   OSTEOARTHRITIS 12/24/2006   Other constipation 11/06/2009   Pneumonia    PONV (postoperative nausea and vomiting)    PROSTATITIS, RECURRENT 12/30/2007   SHOULDER PAIN, BILATERAL 07/16/2007   Sleep apnea    Squamous cell carcinoma, ear, left    TRANSIENT ISCHEMIC ATTACKS, HX OF 04/06/2009    Past Surgical History:  Procedure Laterality Date   ANKLE ARTHROSCOPY W/ OPEN REPAIR  2002   Repair   COLONOSCOPY     COLONOSCOPY     around 2017. [place]    CYST EXCISION     from back 2015. likely this was meaning of ear cyst   EAR CYST EXCISION Bilateral 01/18/2013   Procedure: EXCISION  CYSTS FROM BACK (TWO);  Surgeon: Earnstine Regal, MD;  Location: [place];  Service: General;  Laterality: Bilateral;   epidydmal cyst excision     ESOPHAGOGASTRODUODENOSCOPY     [place] around 2017   [place]  2005/2007   SKIN CANCER EXCISION  2020   ear, squamous cell   TONSILLECTOMY      There were no vitals filed for this visit.   Subjective Assessment - 05/16/21 1750     Subjective COVID-19 screen performed prior to patient entering clinic.  No new complaints.    Pertinent History H/o TIA's, h/o spinal pain, OA and ankle surgery, bilateral TKA's, h/o TIA's, UE tremor    Patient Stated Goals Use left UE without pain.    Currently in Pain? Yes    Pain Score 3     Pain Location Shoulder    Pain Orientation Left    Pain Descriptors / Indicators Sore    Pain Type Chronic pain    Pain Onset More than a month ago                               [facility] Adult PT Treatment/Exercise - 05/16/21 0001  Exercises   Exercises Shoulder      Shoulder Exercises: Standing   Protraction Strengthening;Left;20 reps;10 reps    Theraband Level (Shoulder Protraction) Level 2 (Red)    External Rotation Strengthening;20 reps;Theraband;Left    Theraband Level (Shoulder External Rotation) Level 2 (Red)    Internal Rotation Strengthening;20 reps;Theraband;Left    Theraband Level (Shoulder Internal Rotation) Level 2 (Red)    Extension Strengthening;20 reps;Theraband;Left    Theraband Level (Shoulder Extension) Level 2 (Red)      Shoulder Exercises: Pulleys   Flexion 3 minutes      Shoulder Exercises: ROM/Strengthening   UBE (Upper Arm Bike) 60 RPM x8 min    Wall Wash :UE CW and CCw circles 3 x10 ball/wall    Wall Pushups 15 reps      Manual Therapy   Soft tissue mobilization STW/M x 10 minutes to patient left UT region and posterior cuff musculatur ewith ischemic release technique utililized to decrease tone and pain.                           PT Long Term Goals - 05/16/21 1015       PT LONG TERM GOAL #1   Title Independent with a HEP.    Time 6    Period Weeks    Status Achieved      PT LONG TERM GOAL #2   Title Active left shoulder flexion to 150 degrees so the patient can easily reach overhead.    Time 6    Period Weeks    Status Partially Met   140 degrees 05-16-21     PT LONG TERM GOAL #3   Title Increase left shoulder ER strength to a solid 4+/5 to increase stability for performance of functional activities.    Time 6    Period Weeks    Status Partially Met   4/5     PT LONG TERM GOAL #4   Title Perform ADL's with left shoulder pain not > 3-4/10.    Time 6    Period Weeks    Status Partially Met                   Plan - 05/16/21 1028     Clinical Impression Statement Pt arrived today doing fairly well and is able to sleep on that shldr now. Rx focused on ROM and strengthening LT shldr.140 degrees of flexion today    Personal Factors and Comorbidities Comorbidity 1;Comorbidity 2;Other    Comorbidities H/o TIA's, h/o spinal pain, OA and ankle surgery, bilateral TKA's, h/o TIA's, UE tremor    Examination-Activity Limitations Other;Reach Overhead    Examination-Participation Restrictions Other;Yard Work    [employer]chant [employer] officer Evolving/Moderate complexity    Rehab Potential Good    PT Frequency 2x / week    PT Duration 6 weeks    PT Treatment/Interventions ADLs/Self Care Home Management;Cryotherapy;Electrical Stimulation;Moist Heat;Ultrasound;Therapeutic exercise;Therapeutic activities;Patient/family education;Manual techniques;Passive range of motion;Dry needling;Vasopneumatic Device;Joint Manipulations    PT Next Visit Plan No modalities after 10-27 -22 as per [facility] ins.          PROM to patient's left shoulder.  PRE's.    MD note after next visit. 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 [place], [place], [address] Phone: [phone]   Fax:  [phone]  Name: TAMAR LIPSCOMB MRN: [id number] Date of Birth: 06/16/1946

## 2021-05-20 ENCOUNTER — Ambulatory Visit: Payer: No Typology Code available for payment source

## 2021-05-20 ENCOUNTER — Other Ambulatory Visit: Payer: Self-pay

## 2021-05-20 DIAGNOSIS — M25612 Stiffness of left shoulder, not elsewhere classified: Secondary | ICD-10-CM

## 2021-05-20 DIAGNOSIS — G8929 Other chronic pain: Secondary | ICD-10-CM

## 2021-05-20 DIAGNOSIS — M6281 Muscle weakness (generalized): Secondary | ICD-10-CM

## 2021-05-20 DIAGNOSIS — M25512 Pain in left shoulder: Secondary | ICD-10-CM | POA: Diagnosis not present

## 2021-05-20 NOTE — Therapy (Signed)
[facility] [place], [place], [address] Phone: [phone]   Fax:  [phone]  Physical Therapy Treatment  Patient Details  Name: [name] MRN: [id number] Date of Birth: May 11, 1946 Referring Provider (PT): Charles Sabal PA-C   Encounter Date: 05/20/2021   PT End of Session - 05/20/21 0941     Visit Number 11    Number of Visits 12    Date for PT Re-Evaluation 05/23/21    Authorization Time Period no modalities after 2 weeks   04-25-21    PT Start Time 0945    PT Stop Time 1028    PT Time Calculation (min) 43 min    Activity Tolerance Patient tolerated treatment well    Behavior During Therapy [facility] for tasks assessed/performed             Past Medical History:  Diagnosis Date   ALLERGIC RHINITIS CAUSE UNSPECIFIED 05/27/2010   Arthritis    BACK PAIN, UPPER 02/21/2010   Blood transfusion without reported diagnosis    with knee replacement   Cervicalgia 05/27/2010   CHEST PAIN UNSPECIFIED 04/25/2009   DIVERTICULOSIS, COLON 03/09/2007   DIZZINESS, CHRONIC 03/09/2007   GERD 12/24/2006   HEADACHE, SINUS 04/06/2009   History of colon polyps    HYPERLIPIDEMIA 12/24/2006   diet controlled   HYPERTENSION 12/24/2006   better after weight loss   INSOMNIA WITH SLEEP APNEA UNSPECIFIED 05/27/2010   Leukocytosis 12/08/2019   Motion sickness    Neuromuscular disorder ([place])    tremor   Neuropathy    Open wound of wrist, complicated 1/74/0814   OSTEOARTHRITIS 12/24/2006   Other constipation 11/06/2009   Pneumonia    PONV (postoperative nausea and vomiting)    PROSTATITIS, RECURRENT 12/30/2007   SHOULDER PAIN, BILATERAL 07/16/2007   Sleep apnea    Squamous cell carcinoma, ear, left    TRANSIENT ISCHEMIC ATTACKS, HX OF 04/06/2009    Past Surgical History:  Procedure Laterality Date   ANKLE ARTHROSCOPY W/ OPEN REPAIR  2002   Repair   COLONOSCOPY     COLONOSCOPY     around 2017. [place]    CYST EXCISION      from back 2015. likely this was meaning of ear cyst   EAR CYST EXCISION Bilateral 01/18/2013   Procedure: EXCISION CYSTS FROM BACK (TWO);  Surgeon: Earnstine Regal, MD;  Location: [facility];  Service: General;  Laterality: Bilateral;   epidydmal cyst excision     ESOPHAGOGASTRODUODENOSCOPY     [place] around 2017   [place]  2005/2007   SKIN CANCER EXCISION  2020   ear, squamous cell   TONSILLECTOMY      There were no vitals filed for this visit.   Subjective Assessment - 05/20/21 0942     Subjective COVID-19 screen performed prior to patient entering clinic. Patient reports that his shoulder is a little sore today. He notes that it wasn't bad over the weekend.    Pertinent History H/o TIA's, h/o spinal pain, OA and ankle surgery, bilateral TKA's, h/o TIA's, UE tremor    Patient Stated Goals Use left UE without pain.    Currently in Pain? Yes    Pain Score 2     Pain Location Shoulder    Pain Orientation Left    Pain Descriptors / Indicators Sore    Pain Type Chronic pain    Pain Onset More than a month ago  [facility] Adult PT Treatment/Exercise - 05/20/21 0001       Shoulder Exercises: Standing   External Rotation Strengthening;20 reps;Theraband;Left;10 reps    Theraband Level (Shoulder External Rotation) Level 2 (Red)    Internal Rotation Strengthening;20 reps;10 reps;Left;Theraband    Theraband Level (Shoulder Internal Rotation) Level 2 (Red)    Extension Strengthening;Left;20 reps;Weights    Extension Weight (lbs) 5    Row Strengthening;Left;20 reps;Weights    Row Weight (lbs) 5    Row Limitations 0    Diagonals Left;Strengthening;20 reps    Theraband Level (Shoulder Diagonals) --   Blue XTS   Other Standing Exercises Bicep/hammer curl      Shoulder Exercises: ROM/Strengthening   UBE (Upper Arm Bike) 60 RPM x10 min    [place] 3 minutes    Wall Wash Flexion in doorway   2 minutes                          PT Long Term Goals - 05/20/21 1012       PT LONG TERM GOAL #1   Title Independent with a HEP.    Time 6    Period Weeks    Status Achieved      PT LONG TERM GOAL #2   Title Active left shoulder flexion to 150 degrees so the patient can easily reach overhead.    Baseline 125 degrees limited by soreness and weakness    Time 6    Period Weeks    Status Partially Met   140 degrees 05-16-21     PT LONG TERM GOAL #3   Title Increase left shoulder ER strength to a solid 4+/5 to increase stability for performance of functional activities.    Baseline 4/5    Time 6    Period Weeks    Status Partially Met   4/5     PT LONG TERM GOAL #4   Title Perform ADL's with left shoulder pain not > 3-4/10.    Baseline 4/10 when using the LUE    Time 6    Period Weeks    Status Partially Met                   Plan - 05/20/21 0942     Clinical Impression Statement Patient presented to treatment today requesting to be discharged from physical therapy. He had made fair progress towards his goals for physical therapy as he is now able to sleep on his left shoulder now. However, he continues to experience moderate soreness and discomfort when utilizing his left upper extremity. He was provided an updated HEP to continue to facilitate improved shoulder strengthening. He was able to demonstrate these interventions with good form and minimal cuing. He reported feeling comfortable with these new interventions. He reported feeling comfortable being discharged at this time.    Personal Factors and Comorbidities Comorbidity 1;Comorbidity 2;Other    Comorbidities H/o TIA's, h/o spinal pain, OA and ankle surgery, bilateral TKA's, h/o TIA's, UE tremor    Examination-Activity Limitations Other;Reach Overhead    Examination-Participation Restrictions Other;Yard Work    [employer]chant [employer] officer Evolving/Moderate complexity    Rehab Potential Good    PT  Frequency 2x / week    PT Duration 6 weeks    PT Treatment/Interventions ADLs/Self Care Home Management;Cryotherapy;Electrical Stimulation;Moist Heat;Ultrasound;Therapeutic exercise;Therapeutic activities;Patient/family education;Manual techniques;Passive range of motion;Dry needling;Vasopneumatic Device;Joint Manipulations    PT Next Visit Plan No modalities after 10-27 -22 as per [place]  ins.          PROM to patient's left shoulder.  PRE's.    MD note after next visit. 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 [place], [place], [address] Phone: [phone]   Fax:  [phone]  Name: [name] MRN: [id number] Date of Birth: 26-May-1946  PHYSICAL THERAPY DISCHARGE SUMMARY  Visits from Start of Care: 11  Current functional level related to goals / functional outcomes: Remains limited with left shoulder strength and AROM    Remaining deficits: See clinical  impression statement   Education / Equipment: HEP   Patient agrees to discharge. Patient goals were partially met. Patient is being discharged due to the patient's request.

## 2021-06-14 DIAGNOSIS — D485 Neoplasm of uncertain behavior of skin: Secondary | ICD-10-CM | POA: Diagnosis not present

## 2021-06-14 DIAGNOSIS — L708 Other acne: Secondary | ICD-10-CM | POA: Diagnosis not present

## 2021-06-14 DIAGNOSIS — C44529 Squamous cell carcinoma of skin of other part of trunk: Secondary | ICD-10-CM | POA: Diagnosis not present

## 2021-06-14 DIAGNOSIS — L814 Other melanin hyperpigmentation: Secondary | ICD-10-CM | POA: Diagnosis not present

## 2021-06-14 DIAGNOSIS — L570 Actinic keratosis: Secondary | ICD-10-CM | POA: Diagnosis not present

## 2021-06-14 DIAGNOSIS — Z85828 Personal history of other malignant neoplasm of skin: Secondary | ICD-10-CM | POA: Diagnosis not present

## 2021-06-14 DIAGNOSIS — D225 Melanocytic nevi of trunk: Secondary | ICD-10-CM | POA: Diagnosis not present

## 2021-06-14 DIAGNOSIS — L821 Other seborrheic keratosis: Secondary | ICD-10-CM | POA: Diagnosis not present

## 2021-06-14 DIAGNOSIS — Z08 Encounter for follow-up examination after completed treatment for malignant neoplasm: Secondary | ICD-10-CM | POA: Diagnosis not present

## 2021-06-14 DIAGNOSIS — Z872 Personal history of diseases of the skin and subcutaneous tissue: Secondary | ICD-10-CM | POA: Diagnosis not present

## 2021-07-23 DIAGNOSIS — L570 Actinic keratosis: Secondary | ICD-10-CM | POA: Diagnosis not present

## 2021-08-07 DIAGNOSIS — C44529 Squamous cell carcinoma of skin of other part of trunk: Secondary | ICD-10-CM | POA: Diagnosis not present

## 2021-08-20 DIAGNOSIS — L570 Actinic keratosis: Secondary | ICD-10-CM | POA: Diagnosis not present

## 2021-08-21 ENCOUNTER — Ambulatory Visit: Payer: 59

## 2021-08-28 ENCOUNTER — Telehealth: Payer: Self-pay | Admitting: Family Medicine

## 2021-08-28 NOTE — Telephone Encounter (Signed)
Left message for patient to call back and schedule Medicare Annual Wellness Visit (AWV) either virtually or in office. Left  my Herbie Drape number [phone] ? ? ?Last AWV ;08/20/20 ?please schedule at anytime with [facility] Nurse Health Advisor 1 or 2 ? ? ?This should be a 45 minute visit.  ?

## 2021-09-06 ENCOUNTER — Other Ambulatory Visit: Payer: Self-pay | Admitting: Family Medicine

## 2021-09-26 DIAGNOSIS — L570 Actinic keratosis: Secondary | ICD-10-CM | POA: Diagnosis not present

## 2021-10-07 ENCOUNTER — Other Ambulatory Visit: Payer: Self-pay | Admitting: Family Medicine

## 2021-10-30 DIAGNOSIS — L570 Actinic keratosis: Secondary | ICD-10-CM | POA: Diagnosis not present

## 2021-11-27 DIAGNOSIS — R42 Dizziness and giddiness: Secondary | ICD-10-CM | POA: Diagnosis not present

## 2021-11-27 DIAGNOSIS — J0100 Acute maxillary sinusitis, unspecified: Secondary | ICD-10-CM | POA: Diagnosis not present

## 2022-01-17 ENCOUNTER — Other Ambulatory Visit: Payer: Self-pay

## 2022-01-17 ENCOUNTER — Inpatient Hospital Stay (HOSPITAL_BASED_OUTPATIENT_CLINIC_OR_DEPARTMENT_OTHER): Payer: Medicare Other | Admitting: Hematology & Oncology

## 2022-01-17 ENCOUNTER — Encounter: Payer: Self-pay | Admitting: Hematology & Oncology

## 2022-01-17 ENCOUNTER — Inpatient Hospital Stay: Payer: Medicare Other | Attending: Hematology & Oncology

## 2022-01-17 VITALS — BP 103/60 | HR 86 | Temp 98.0°F | Resp 16 | Ht 68.0 in | Wt 189.0 lb

## 2022-01-17 DIAGNOSIS — D72829 Elevated white blood cell count, unspecified: Secondary | ICD-10-CM

## 2022-01-17 DIAGNOSIS — R7303 Prediabetes: Secondary | ICD-10-CM | POA: Insufficient documentation

## 2022-01-17 DIAGNOSIS — D72823 Leukemoid reaction: Secondary | ICD-10-CM | POA: Insufficient documentation

## 2022-01-17 LAB — CBC WITH DIFFERENTIAL (CANCER CENTER ONLY)
Abs Immature Granulocytes: 0.1 10*3/uL — ABNORMAL HIGH (ref 0.00–0.07)
Basophils Absolute: 0.1 10*3/uL (ref 0.0–0.1)
Basophils Relative: 1 %
Eosinophils Absolute: 0.2 10*3/uL (ref 0.0–0.5)
Eosinophils Relative: 1 %
HCT: 50.8 % (ref 39.0–52.0)
Hemoglobin: 17.1 g/dL — ABNORMAL HIGH (ref 13.0–17.0)
Immature Granulocytes: 1 %
Lymphocytes Relative: 34 %
Lymphs Abs: 4.6 10*3/uL — ABNORMAL HIGH (ref 0.7–4.0)
MCH: 31.6 pg (ref 26.0–34.0)
MCHC: 33.7 g/dL (ref 30.0–36.0)
MCV: 93.9 fL (ref 80.0–100.0)
Monocytes Absolute: 0.9 10*3/uL (ref 0.1–1.0)
Monocytes Relative: 7 %
Neutro Abs: 7.8 10*3/uL — ABNORMAL HIGH (ref 1.7–7.7)
Neutrophils Relative %: 56 %
Platelet Count: 225 10*3/uL (ref 150–400)
RBC: 5.41 MIL/uL (ref 4.22–5.81)
RDW: 13.6 % (ref 11.5–15.5)
WBC Count: 13.7 10*3/uL — ABNORMAL HIGH (ref 4.0–10.5)
nRBC: 0 % (ref 0.0–0.2)

## 2022-01-17 LAB — CMP (CANCER CENTER ONLY)
ALT: 14 U/L (ref 0–44)
AST: 18 U/L (ref 15–41)
Albumin: 4.6 g/dL (ref 3.5–5.0)
Alkaline Phosphatase: 64 U/L (ref 38–126)
Anion gap: 10 (ref 5–15)
BUN: 20 mg/dL (ref 8–23)
CO2: 26 mmol/L (ref 22–32)
Calcium: 9.9 mg/dL (ref 8.9–10.3)
Chloride: 98 mmol/L (ref 98–111)
Creatinine: 1.33 mg/dL — ABNORMAL HIGH (ref 0.61–1.24)
GFR, Estimated: 56 mL/min — ABNORMAL LOW (ref >60)
Glucose, Bld: 212 mg/dL — ABNORMAL HIGH (ref 70–99)
Potassium: 4 mmol/L (ref 3.5–5.1)
Sodium: 134 mmol/L — ABNORMAL LOW (ref 135–145)
Total Bilirubin: 0.7 mg/dL (ref 0.3–1.2)
Total Protein: 7.2 g/dL (ref 6.5–8.1)

## 2022-01-17 LAB — LACTATE DEHYDROGENASE: LDH: 115 U/L (ref 98–192)

## 2022-01-17 NOTE — Progress Notes (Signed)
Hematology and Oncology Follow Up Visit  [name] [id number] 1946-04-29 76 y.o. 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  We see him yearly.  He is doing well.  He has had a problem since we last saw him.  His blood sugar is on the higher side.  He is got had to watch his blood sugar.  He says that he is pre diabetic.  This summer, he has been working with his garden.  He is doing quite well with this.  He has had no problem with infections.  He has had no rashes.  There is been no change in bowel or bladder habits.  He has had no cough or shortness of breath.  He has had no swollen lymph nodes.  He has had no leg swelling.  He has had no headache.  Overall, I was his performance status is probably ECOG 0.   Medications:  Allergies as of 01/17/2022       Reactions   Codeine Nausea Only        Medication List        Accurate as of January 17, 2022 11:14 AM. If you have any questions, ask your nurse or doctor.          amitriptyline 50 MG tablet Commonly known as: ELAVIL Take 1 tablet (50 mg total) by mouth at bedtime. What changed: how much to take   aspirin EC 81 MG tablet Take 1 tablet (81 mg total) by mouth daily.   atorvastatin 80 MG tablet Commonly known as: LIPITOR Take 80 mg by mouth daily.   fluticasone 50 MCG/ACT nasal spray Commonly known as: FLONASE Place 2 sprays into both nostrils daily.   lisinopril 10 MG tablet Commonly known as: ZESTRIL Take 10 mg by mouth daily.   meclizine 12.5 MG tablet Commonly known as: ANTIVERT Take 1-2 tablets (12.5-25 mg total) by mouth every 6 (six) hours as needed for dizziness.   meloxicam 15 MG tablet Commonly known as: MOBIC TAKE 1 TABLET (15 MG TOTAL) BY MOUTH AS NEEDED FOR PAIN.   niacinamide 500 MG tablet Take 500 mg by mouth daily.    pantoprazole 40 MG tablet Commonly known as: PROTONIX TAKE 1 TABLET BY MOUTH EVERY DAY   Vitamin B-12 1000 MCG Subl PLACE 1 TABLET (1,000 MCG TOTAL) UNDER THE TONGUE DAILY.        Allergies:  Allergies  Allergen Reactions   Codeine Nausea Only    Past Medical History, Surgical history, Social history, and Family History were reviewed and updated.  Review of Systems: All other 10 point review of systems is negative.   Physical Exam:  height is '5\' 8"'  (1.727 m) and weight is 189 lb (85.7 kg). His oral temperature is 98 F (36.7 C). His blood pressure is 103/60 and his pulse is 86. His respiration is 16 and oxygen saturation is 98%.   Wt Readings from Last 3 Encounters:  01/17/22 189 lb (85.7 kg)  01/18/21 209 lb 1.3 oz (94.8 kg)  01/04/20 204 lb (92.5 kg)    Physical Exam Vitals reviewed.  HENT:     Head: Normocephalic and atraumatic.  Eyes:     Pupils: Pupils are equal, round, and reactive to light.  Cardiovascular:     Rate and Rhythm: Normal rate and regular  rhythm.     Heart sounds: Normal heart sounds.  Pulmonary:     Effort: Pulmonary effort is normal.     Breath sounds: Normal breath sounds.  Abdominal:     General: Bowel sounds are normal.     Palpations: Abdomen is soft.  Musculoskeletal:        General: No tenderness or deformity. Normal range of motion.     Cervical back: Normal range of motion.  Lymphadenopathy:     Cervical: No cervical adenopathy.  Skin:    General: Skin is warm and dry.     Findings: No erythema or rash.  Neurological:     Mental Status: He is alert and oriented to person, place, and time.  Psychiatric:        Behavior: Behavior normal.        Thought Content: Thought content normal.        Judgment: Judgment normal.      Lab Results  Component Value Date   WBC 13.7 (H) 01/17/2022   HGB 17.1 (H) 01/17/2022   HCT 50.8 01/17/2022   MCV 93.9 01/17/2022   PLT 225 01/17/2022   Lab Results  Component Value Date    FERRITIN 244.0 11/06/2009   IRON 64 11/06/2009   IRONPCTSAT 20.9 11/06/2009   Lab Results  Component Value Date   RBC 5.41 01/17/2022   No results found for: "KPAFRELGTCHN", "LAMBDASER", "KAPLAMBRATIO" Lab Results  Component Value Date   IGA 212 11/06/2009   No results found for: "TOTALPROTELP", "ALBUMINELP", "A1GS", "A2GS", "BETS", "BETA2SER", "GAMS", "MSPIKE", "SPEI"   Chemistry      Component Value Date/Time   NA 134 (L) 01/17/2022 0914   K 4.0 01/17/2022 0914   CL 98 01/17/2022 0914   CO2 26 01/17/2022 0914   BUN 20 01/17/2022 0914   CREATININE 1.33 (H) 01/17/2022 0914      Component Value Date/Time   CALCIUM 9.9 01/17/2022 0914   ALKPHOS 64 01/17/2022 0914   AST 18 01/17/2022 0914   ALT 14 01/17/2022 0914   BILITOT 0.7 01/17/2022 0914       Impression and Plan: [name] is a very pleasant 76 yo caucasian gentleman with chronic reactive leukocytosis.  Again, he had a thorough work-up.  He has not had a bone marrow biopsy done which he does not need to have done.  I think the key is that he is negative for JAK2 mutations.  His blood count is holding steady.  Over the past 3 years, there is been really no change in his white cell count.  Looked at his smear under the microscope.  I do not see anything that looks suspicious with his white blood cells.  There are no immature white blood cells.  He had no nucleated red blood cells.  Platelets looked fine.  I still think we can just follow him along yearly.  I think if everything looks stable next year, then maybe we can let him go from the clinic.  Volanda Napoleon, MD 7/21/202311:14 AM

## 2022-02-13 DIAGNOSIS — L821 Other seborrheic keratosis: Secondary | ICD-10-CM | POA: Diagnosis not present

## 2022-02-13 DIAGNOSIS — Z08 Encounter for follow-up examination after completed treatment for malignant neoplasm: Secondary | ICD-10-CM | POA: Diagnosis not present

## 2022-02-13 DIAGNOSIS — L814 Other melanin hyperpigmentation: Secondary | ICD-10-CM | POA: Diagnosis not present

## 2022-02-13 DIAGNOSIS — C4442 Squamous cell carcinoma of skin of scalp and neck: Secondary | ICD-10-CM | POA: Diagnosis not present

## 2022-02-13 DIAGNOSIS — L538 Other specified erythematous conditions: Secondary | ICD-10-CM | POA: Diagnosis not present

## 2022-02-13 DIAGNOSIS — L570 Actinic keratosis: Secondary | ICD-10-CM | POA: Diagnosis not present

## 2022-02-13 DIAGNOSIS — Z85828 Personal history of other malignant neoplasm of skin: Secondary | ICD-10-CM | POA: Diagnosis not present

## 2022-02-13 DIAGNOSIS — L820 Inflamed seborrheic keratosis: Secondary | ICD-10-CM | POA: Diagnosis not present

## 2022-02-13 DIAGNOSIS — D225 Melanocytic nevi of trunk: Secondary | ICD-10-CM | POA: Diagnosis not present

## 2022-02-13 DIAGNOSIS — D485 Neoplasm of uncertain behavior of skin: Secondary | ICD-10-CM | POA: Diagnosis not present

## 2022-03-13 DIAGNOSIS — D044 Carcinoma in situ of skin of scalp and neck: Secondary | ICD-10-CM | POA: Diagnosis not present

## 2022-05-20 DIAGNOSIS — L570 Actinic keratosis: Secondary | ICD-10-CM | POA: Diagnosis not present

## 2022-05-20 DIAGNOSIS — L578 Other skin changes due to chronic exposure to nonionizing radiation: Secondary | ICD-10-CM | POA: Diagnosis not present

## 2022-06-02 DIAGNOSIS — J0110 Acute frontal sinusitis, unspecified: Secondary | ICD-10-CM | POA: Diagnosis not present

## 2022-06-02 DIAGNOSIS — R051 Acute cough: Secondary | ICD-10-CM | POA: Diagnosis not present

## 2022-06-25 DIAGNOSIS — L570 Actinic keratosis: Secondary | ICD-10-CM | POA: Diagnosis not present

## 2022-06-25 DIAGNOSIS — L578 Other skin changes due to chronic exposure to nonionizing radiation: Secondary | ICD-10-CM | POA: Diagnosis not present

## 2022-12-19 ENCOUNTER — Encounter: Payer: Self-pay | Admitting: Gastroenterology

## 2023-01-06 ENCOUNTER — Other Ambulatory Visit: Payer: Self-pay | Admitting: *Deleted

## 2023-01-06 DIAGNOSIS — M5416 Radiculopathy, lumbar region: Secondary | ICD-10-CM

## 2023-01-06 DIAGNOSIS — M4156 Other secondary scoliosis, lumbar region: Secondary | ICD-10-CM

## 2023-01-08 ENCOUNTER — Other Ambulatory Visit: Payer: Self-pay

## 2023-01-08 ENCOUNTER — Encounter: Payer: Self-pay | Admitting: Physical Therapy

## 2023-01-08 ENCOUNTER — Ambulatory Visit: Payer: Medicare Other | Attending: *Deleted | Admitting: Physical Therapy

## 2023-01-08 DIAGNOSIS — M62830 Muscle spasm of back: Secondary | ICD-10-CM | POA: Diagnosis present

## 2023-01-08 DIAGNOSIS — R293 Abnormal posture: Secondary | ICD-10-CM | POA: Diagnosis present

## 2023-01-08 DIAGNOSIS — M5459 Other low back pain: Secondary | ICD-10-CM | POA: Insufficient documentation

## 2023-01-08 NOTE — Therapy (Signed)
OUTPATIENT PHYSICAL THERAPY THORACOLUMBAR EVALUATION   Patient Name: [name] MRN: [id number] DOB:Oct 01, 1945, 77 y.o., male Today's Date: 01/08/2023  END OF SESSION:  PT End of Session - 01/08/23 1737     Visit Number 1    Number of Visits 12    Date for PT Re-Evaluation 02/19/23    PT Start Time 0316    PT Stop Time 0407    PT Time Calculation (min) 51 min    Activity Tolerance Patient tolerated treatment well    Behavior During Therapy [facility] for tasks assessed/performed             Past Medical History:  Diagnosis Date   ALLERGIC RHINITIS CAUSE UNSPECIFIED 05/27/2010   Arthritis    BACK PAIN, UPPER 02/21/2010   Blood transfusion without reported diagnosis    with knee replacement   Cervicalgia 05/27/2010   CHEST PAIN UNSPECIFIED 04/25/2009   DIVERTICULOSIS, COLON 03/09/2007   DIZZINESS, CHRONIC 03/09/2007   GERD 12/24/2006   HEADACHE, SINUS 04/06/2009   History of colon polyps    HYPERLIPIDEMIA 12/24/2006   diet controlled   HYPERTENSION 12/24/2006   better after weight loss   INSOMNIA WITH SLEEP APNEA UNSPECIFIED 05/27/2010   Leukocytosis 12/08/2019   Motion sickness    Neuromuscular disorder (HCC)    tremor   Neuropathy    Open wound of wrist, complicated 10/13/2007   OSTEOARTHRITIS 12/24/2006   Other constipation 11/06/2009   Pneumonia    PONV (postoperative nausea and vomiting)    PROSTATITIS, RECURRENT 12/30/2007   SHOULDER PAIN, BILATERAL 07/16/2007   Sleep apnea    Squamous cell carcinoma, ear, left    TRANSIENT ISCHEMIC ATTACKS, HX OF 04/06/2009   Past Surgical History:  Procedure Laterality Date   ANKLE ARTHROSCOPY W/ OPEN REPAIR  2002   Repair   COLONOSCOPY     COLONOSCOPY     around 2017. [place]    CYST EXCISION     from back 2015. 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   Tremor 04/10/2014   Elevated hemoglobin A1c 04/10/2014   Solitary pulmonary nodule 04/10/2014   Constipation, slow transit 08/14/2010   Allergic rhinitis 05/27/2010   Cervicalgia 05/27/2010   Insomnia with sleep apnea 05/27/2010   HEADACHE, SINUS 04/06/2009   SHOULDER PAIN, BILATERAL 07/16/2007   DIZZINESS, CHRONIC 03/09/2007   Hyperlipidemia 12/24/2006   Essential hypertension 12/24/2006   GERD 12/24/2006   Osteoarthritis 12/24/2006    REFERRING PROVIDER: Cameron Sprang PA-C  REFERRING DIAG: Lumbar radiculopathy  Rationale for Evaluation and Treatment: Rehabilitation  THERAPY DIAG:  Other low back pain  Muscle spasm of back  Abnormal posture  ONSET DATE: ~6 weeks ago.  SUBJECTIVE:  SUBJECTIVE STATEMENT: The patient presents to the clinic with c/o right low back and hip pain with radiation of a burning pain into his thigh.  He states that approximately 6 weeks ago he was exercising by bending over and felt some right-sided low back pain.  A couple weeks later he lifted some heavy objects and then began to experience intense pain.  In fact, today he rates his pain as a 9-1/0 and was in obvious pain.  Walking and standing increase his pain.  Heat and cold help decrease his pain some.    PERTINENT HISTORY:  LBP in past.  Bilateral TKR's.  PAIN:  Are you having pain? Yes: NPRS scale: 9-10/10 Pain location: Right LB/hip and lateral thigh. Pain description:  Burning and sharp. Aggravating factors: As above. Relieving factors: As above.  PRECAUTIONS: None  RED FLAGS: No.  WEIGHT BEARING RESTRICTIONS: No  FALLS:  Has patient fallen in last 6 months? No  LIVING ENVIRONMENT: Lives with: lives with their spouse Lives in: House/apartment Has following equipment at home: None  OCCUPATION: No.  PLOF: Independent  PATIENT GOALS: Not have pain.   OBJECTIVE:   POSTURE: rounded shoulders, forward head, decreased lumbar lordosis, and flexed trunk , scoliosis.  PALPATION: Tender to palpation over right SIJ and proximal gluteal musculature.  LUMBAR ROM:   Active lumbar flexion limited by 25% and active extension to 10 degrees.   LOWER EXTREMITY MMT:    Normal LE DTR's.  LUMBAR SPECIAL TESTS:  Significant leg length discrepancy with right shorter than left.   Normal bilateral Patellar reflexes, normal left Achilles, unable to elicit right Achilles.  GAIT: The patient walking in a flexed trunk posture in obvious pain.  TODAY'S TREATMENT:                                                                                                                              DATE: HMP and IFC at 80-150 Hz on 40% scan x 20 minutes to patient's left LB/left hip region.  Normal modality response following removal of modality.   PATIENT EDUCATION:  Education details: Discussed sleeping with pillow between knees to keep hips in alignment. Person educated: Patient Education method: Medical illustrator Education comprehension: verbalized understanding and returned demonstration  HOME EXERCISE PROGRAM:   ASSESSMENT:  CLINICAL IMPRESSION: The patient presents to OPPT with c/o right sided low back pain and radiation into hip and lateral thigh.  He was in a great deal of pain today and was walking in a flexed trunk posture.  He is tender to palpation over his right SIJ and proximal gluteal musculature. His right LE is significantly shorter  than his left.  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  He has postural abnormalities including a loss of lordosis and scoliosis.  Patient will benefit from skilled physical therapy intervention to address pain and deficits.  OBJECTIVE IMPAIRMENTS: Abnormal gait, decreased activity tolerance, difficulty walking, decreased ROM, increased muscle spasms, postural dysfunction,  and pain.   ACTIVITY LIMITATIONS: carrying, lifting, bending, standing, sleeping, dressing, and locomotion level  PARTICIPATION LIMITATIONS: meal prep, cleaning, laundry, community activity, and yard work  PERSONAL FACTORS: 1 comorbidity: LBP in past  are also affecting patient's functional outcome.   REHAB POTENTIAL: Good  CLINICAL DECISION MAKING: Stable/uncomplicated  EVALUATION COMPLEXITY: Low   GOALS:  SHORT TERM GOALS: Target date: 01/22/23  Ind with an initial HEP. Goal status: INITIAL   LONG TERM GOALS: Target date: 02/19/23.  Ind with an advanced HEP.  Goal status: INITIAL  2.  Walk a community distance with pain not > 3/10.  Goal status: INITIAL  3.  Eliminate radiation of pain/symptoms into right hip and lateral thigh.  Goal status: INITIAL  4.  Stand 20 minutes with pain not > 3/10.  Goal status: INITIAL  5.  Perform ADL's with pain not > 3-4/10.  Goal status: INITIAL   PLAN:  PT FREQUENCY: 2x/week  PT DURATION: 6 weeks  PLANNED INTERVENTIONS: Therapeutic exercises, Therapeutic activity, Neuromuscular re-education, Patient/Family education, Self Care, Joint mobilization, Dry Needling, Electrical stimulation, Spinal mobilization, Cryotherapy, Moist heat, Traction, Ultrasound, and Manual therapy.  PLAN FOR NEXT SESSION: Combo e'stim/US, STW/M, core exercise progression, spinal protection techniques and body mechanics training.  Consider intermittent lumbar traction beginning at 35% body weight.    Jenita Rayfield, [place], PT 01/08/2023, 6:07 PM

## 2023-01-09 ENCOUNTER — Ambulatory Visit: Payer: Medicare Other | Admitting: Physical Therapy

## 2023-01-09 ENCOUNTER — Encounter: Payer: Self-pay | Admitting: Physical Therapy

## 2023-01-09 DIAGNOSIS — M5459 Other low back pain: Secondary | ICD-10-CM

## 2023-01-09 DIAGNOSIS — M62830 Muscle spasm of back: Secondary | ICD-10-CM

## 2023-01-09 DIAGNOSIS — R293 Abnormal posture: Secondary | ICD-10-CM

## 2023-01-09 NOTE — Therapy (Addendum)
OUTPATIENT PHYSICAL THERAPY THORACOLUMBAR EVALUATION   Patient Name: [name] MRN: [id number] DOB:August 09, 1945, 77 y.o., male Today's Date: 01/09/2023  END OF SESSION:  PT End of Session - 01/09/23 1100     Visit Number 2    Number of Visits 12    Date for PT Re-Evaluation 02/19/23    PT Start Time 1100    PT Stop Time 1210    PT Time Calculation (min) 70 min    Activity Tolerance Patient tolerated treatment well    Behavior During Therapy [facility] for tasks assessed/performed            Past Medical History:  Diagnosis Date   ALLERGIC RHINITIS CAUSE UNSPECIFIED 05/27/2010   Arthritis    BACK PAIN, UPPER 02/21/2010   Blood transfusion without reported diagnosis    with knee replacement   Cervicalgia 05/27/2010   CHEST PAIN UNSPECIFIED 04/25/2009   DIVERTICULOSIS, COLON 03/09/2007   DIZZINESS, CHRONIC 03/09/2007   GERD 12/24/2006   HEADACHE, SINUS 04/06/2009   History of colon polyps    HYPERLIPIDEMIA 12/24/2006   diet controlled   HYPERTENSION 12/24/2006   better after weight loss   INSOMNIA WITH SLEEP APNEA UNSPECIFIED 05/27/2010   Leukocytosis 12/08/2019   Motion sickness    Neuromuscular disorder (HCC)    tremor   Neuropathy    Open wound of wrist, complicated 10/13/2007   OSTEOARTHRITIS 12/24/2006   Other constipation 11/06/2009   Pneumonia    PONV (postoperative nausea and vomiting)    PROSTATITIS, RECURRENT 12/30/2007   SHOULDER PAIN, BILATERAL 07/16/2007   Sleep apnea    Squamous cell carcinoma, ear, left    TRANSIENT ISCHEMIC ATTACKS, HX OF 04/06/2009   Past Surgical History:  Procedure Laterality Date   ANKLE ARTHROSCOPY W/ OPEN REPAIR  2002   Repair   COLONOSCOPY     COLONOSCOPY     around 2017. [place]    CYST EXCISION     from back 2015. 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   Tremor 04/10/2014   Elevated hemoglobin A1c 04/10/2014   Solitary pulmonary nodule 04/10/2014   Constipation, slow transit 08/14/2010   Allergic rhinitis 05/27/2010   Cervicalgia 05/27/2010   Insomnia with sleep apnea 05/27/2010   HEADACHE, SINUS 04/06/2009   SHOULDER PAIN, BILATERAL 07/16/2007   DIZZINESS, CHRONIC 03/09/2007   Hyperlipidemia 12/24/2006   Essential hypertension 12/24/2006   GERD 12/24/2006   Osteoarthritis 12/24/2006    REFERRING PROVIDER: Cameron Sprang PA-C  REFERRING DIAG: Lumbar radiculopathy  Rationale for Evaluation and Treatment: Rehabilitation  THERAPY DIAG:  Other low back pain  Muscle spasm of back  Abnormal posture  ONSET DATE: ~6 weeks ago.  SUBJECTIVE:  SUBJECTIVE STATEMENT: Reports radicular pain in LE  Not seeing much relief with home conservative treatments.  PERTINENT HISTORY:  LBP in past.  Bilateral TKR's.  PAIN:  Are you having pain? Yes: NPRS scale: 10/10 Pain location: Right LB/hip and lateral thigh. Pain description: Burning and sharp. Aggravating factors: As above. Relieving factors: As above.  PRECAUTIONS: None  RED FLAGS: No.  WEIGHT BEARING RESTRICTIONS: No  FALLS:  Has patient fallen in last 6 months? No  LIVING ENVIRONMENT: Lives with: lives with their spouse Lives in: House/apartment Has following equipment at home: None  OCCUPATION: No.  PLOF: Independent  PATIENT GOALS: Not have pain.  OBJECTIVE:    POSTURE: rounded shoulders, forward head, decreased lumbar lordosis, and flexed trunk , scoliosis.  PALPATION: Tender to palpation over right SIJ and proximal gluteal musculature.  LUMBAR ROM:  Active lumbar flexion limited by 25% and active extension to 10 degrees.  LOWER EXTREMITY MMT:   Normal LE DTR's.  LUMBAR SPECIAL TESTS:  Significant leg length discrepancy with right shorter than left.   Normal bilateral Patellar reflexes, normal left Achilles, unable to elicit right Achilles.  GAIT: The patient walking in a flexed trunk posture in obvious pain.  TODAY'S TREATMENT:                                                                                                                              DATE: 01/09/23 EXERCISE LOG  Exercise Repetitions and Resistance Comments  HS set X15 reps 5 sec holds   Pillow squeeze X15 reps 5 sec holds   Bridge X10 reps Painful; burning sensation in LE  Hip flexor stretch 2x30 sec   SKTC Dynamic     Blank cell = exercise not performed today   Modalities  Date: 01/09/23 Traction: lumbar, 80# max/ 5# min, 15 mins mins Combo: Lumbar, 1.5 w/cm2 ,100%, 1 mhz, 10 mins, Pain  Manual Therapy Soft Tissue Mobilization: R lumbar paraspinals, QL, to reduce pain and tone    PATIENT EDUCATION:  Education details: Discussed sleeping with pillow between knees to keep hips in alignment. Person educated: Patient Education method: Medical illustrator Education comprehension: verbalized understanding and returned demonstration  HOME EXERCISE PROGRAM:  ASSESSMENT:  CLINICAL IMPRESSION: Patient presented in clinic with reports of intense pain and limitations with standing due to intense radicular pain. Patient able to complete light therex for hip stretching and SI stabilization with reports of pain with bridging and EOB hip flexor stretch. Increased tone palpable in R lumbar musculature. Limited tolerance for positioning for sleep to SL or prone.  Mechanical lumbar traction initiated at 80# max with patient not reporting any increased symptoms although patient states that it normally takes several steps for symptoms to return.  OBJECTIVE IMPAIRMENTS: Abnormal gait, decreased activity tolerance, difficulty walking, decreased ROM, increased muscle spasms, postural dysfunction, and pain.   ACTIVITY LIMITATIONS: carrying, lifting, bending, standing, sleeping, dressing, and locomotion level  PARTICIPATION LIMITATIONS: meal prep, cleaning, laundry,  community activity, and yard work  PERSONAL FACTORS: 1 comorbidity: LBP in past  are also affecting patient's functional outcome.   REHAB POTENTIAL: Good  CLINICAL DECISION MAKING: Stable/uncomplicated  EVALUATION COMPLEXITY: Low   GOALS:  SHORT TERM GOALS: Target date: 01/22/23  Ind with an initial HEP. Goal status: INITIAL   LONG TERM GOALS: Target date: 02/19/23.  Ind with an advanced HEP.  Goal status: INITIAL  2.  Walk a community distance with pain not > 3/10.  Goal status: INITIAL  3.  Eliminate radiation of pain/symptoms into right hip and lateral thigh.  Goal status: INITIAL  4.  Stand 20 minutes with pain not > 3/10.  Goal status: INITIAL  5.  Perform ADL's with pain not > 3-4/10.  Goal status: INITIAL  PLAN:  PT FREQUENCY: 2x/week  PT DURATION: 6 weeks  PLANNED INTERVENTIONS: Therapeutic exercises, Therapeutic activity, Neuromuscular re-education, Patient/Family education, Self Care, Joint mobilization, Dry Needling, Electrical stimulation, Spinal mobilization, Cryotherapy, Moist heat, Traction, Ultrasound, and Manual therapy.  PLAN FOR NEXT SESSION: Combo e'stim/US, STW/M, core exercise progression, spinal protection techniques and body mechanics training.  Consider intermittent lumbar traction beginning at 35% body weight.  Marvell Fuller, PTA 01/09/2023, 12:32 PM

## 2023-01-12 ENCOUNTER — Ambulatory Visit: Payer: Medicare Other | Admitting: Physical Therapy

## 2023-01-12 ENCOUNTER — Encounter: Payer: Self-pay | Admitting: Physical Therapy

## 2023-01-12 DIAGNOSIS — M62830 Muscle spasm of back: Secondary | ICD-10-CM

## 2023-01-12 DIAGNOSIS — M5459 Other low back pain: Secondary | ICD-10-CM

## 2023-01-12 DIAGNOSIS — R293 Abnormal posture: Secondary | ICD-10-CM

## 2023-01-12 NOTE — Therapy (Signed)
OUTPATIENT PHYSICAL THERAPY THORACOLUMBAR TREATMENT   Patient Name: [name] MRN: [id number] DOB:[birth year], 77 y.o., male Today's Date: 01/12/2023  END OF SESSION:  PT End of Session - 01/12/23 1104     Visit Number 3    Number of Visits 12    Date for PT Re-Evaluation 02/19/23    PT Start Time 1104    PT Stop Time 1148    PT Time Calculation (min) 44 min    Activity Tolerance Patient tolerated treatment well    Behavior During Therapy [facility] for tasks assessed/performed            Past Medical History:  Diagnosis Date   ALLERGIC RHINITIS CAUSE UNSPECIFIED 05/27/2010   Arthritis    BACK PAIN, UPPER 02/21/2010   Blood transfusion without reported diagnosis    with knee replacement   Cervicalgia 05/27/2010   CHEST PAIN UNSPECIFIED 04/25/2009   DIVERTICULOSIS, COLON 03/09/2007   DIZZINESS, CHRONIC 03/09/2007   GERD 12/24/2006   HEADACHE, SINUS 04/06/2009   History of colon polyps    HYPERLIPIDEMIA 12/24/2006   diet controlled   HYPERTENSION 12/24/2006   better after weight loss   INSOMNIA WITH SLEEP APNEA UNSPECIFIED 05/27/2010   Leukocytosis 12/08/2019   Motion sickness    Neuromuscular disorder (HCC)    tremor   Neuropathy    Open wound of wrist, complicated 10/13/2007   OSTEOARTHRITIS 12/24/2006   Other constipation 11/06/2009   Pneumonia    PONV (postoperative nausea and vomiting)    PROSTATITIS, RECURRENT 12/30/2007   SHOULDER PAIN, BILATERAL 07/16/2007   Sleep apnea    Squamous cell carcinoma, ear, left    TRANSIENT ISCHEMIC ATTACKS, HX OF 04/06/2009   Past Surgical History:  Procedure Laterality Date   ANKLE ARTHROSCOPY W/ OPEN REPAIR  2002   Repair   COLONOSCOPY     COLONOSCOPY     around 2017. [place]    CYST EXCISION     from back 2015. 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   Tremor 04/10/2014   Elevated hemoglobin A1c 04/10/2014   Solitary pulmonary nodule 04/10/2014   Constipation, slow transit 08/14/2010   Allergic rhinitis 05/27/2010   Cervicalgia 05/27/2010   Insomnia with sleep apnea 05/27/2010   HEADACHE, SINUS 04/06/2009   SHOULDER PAIN, BILATERAL 07/16/2007   DIZZINESS, CHRONIC 03/09/2007   Hyperlipidemia 12/24/2006   Essential hypertension 12/24/2006   GERD 12/24/2006   Osteoarthritis 12/24/2006   REFERRING PROVIDER: Cameron Sprang PA-C  REFERRING DIAG: Lumbar radiculopathy  Rationale for Evaluation and Treatment: Rehabilitation  THERAPY DIAG:  Other low back pain  Muscle spasm of back  Abnormal posture  ONSET DATE: ~6 weeks ago.  SUBJECTIVE:  SUBJECTIVE STATEMENT: Pain shooting to R ankle and only had relief for about an hour. Has been hurting badly all weekend. Has tightness in his calf from the pain.  PERTINENT HISTORY:  LBP in past.  Bilateral TKR's.  PAIN:  Are you having pain? Yes: NPRS scale: 10/10 Pain location: Right LB/hip and lateral thigh. Pain description: Burning and sharp. Aggravating factors: As above. Relieving factors: As above.  PRECAUTIONS: None  RED FLAGS: No.  WEIGHT BEARING RESTRICTIONS: No  PATIENT GOALS: Not have pain.  OBJECTIVE:   POSTURE: rounded shoulders, forward head, decreased lumbar lordosis, and flexed trunk , scoliosis.  PALPATION: Tender to palpation over right SIJ and proximal  gluteal musculature.  LUMBAR ROM:  Active lumbar flexion limited by 25% and active extension to 10 degrees.  LOWER EXTREMITY MMT:   Normal LE DTR's.  LUMBAR SPECIAL TESTS:  Significant leg length discrepancy with right shorter than left.   Normal bilateral Patellar reflexes, normal left Achilles, unable to elicit right Achilles.  GAIT: The patient walking in a flexed trunk posture in obvious pain.  TODAY'S TREATMENT:                                                                                                                              DATE: 01/12/23 Modalities  Date: 01/12/23 Unattended Estim: Lumbar, Pre-Mod, 10 mins, Pain and Tone Combo: Lumbar, 1.5 w/cm2 ,100%, 1 mhz, 10 mins, Pain and Tone  Manual Therapy Soft Tissue Mobilization: R lumbar paraspinals, QL, SI joint, to reduce pain and tone    PATIENT EDUCATION:  Education details: Discussed sleeping with pillow between knees to keep hips in alignment. Person educated: Patient Education method: Medical illustrator Education comprehension: verbalized understanding and returned demonstration  HOME EXERCISE PROGRAM:  ASSESSMENT:  CLINICAL IMPRESSION: Patient presented in clinic with reports of intense pain over the weekend. Patient having radicular pain down to R ankle region. Patient has also had tightness in calf along with pain. Patient presented with increased tone of R lumbar paraspinals and tenderness surrounding R SI joint. Normal modalities response noted following removal of the modalities. Patient is very interested in DN but waiting on cert sign off.  OBJECTIVE IMPAIRMENTS: Abnormal gait, decreased activity tolerance, difficulty walking, decreased ROM, increased muscle spasms, postural dysfunction, and pain.   ACTIVITY LIMITATIONS: carrying, lifting, bending, standing, sleeping, dressing, and locomotion level  PARTICIPATION LIMITATIONS: meal prep, cleaning, laundry, community activity, and yard  work  PERSONAL FACTORS: 1 comorbidity: LBP in past  are also affecting patient's functional outcome.   REHAB POTENTIAL: Good  CLINICAL DECISION MAKING: Stable/uncomplicated  EVALUATION COMPLEXITY: Low  GOALS:  SHORT TERM GOALS: Target date: 01/22/23  Ind with an initial HEP. Goal status: INITIAL   LONG TERM GOALS: Target date: 02/19/23.  Ind with an advanced HEP.  Goal status: INITIAL  2.  Walk a community distance with pain not > 3/10.  Goal status: INITIAL  3.  Eliminate radiation of pain/symptoms into  right hip and lateral thigh.  Goal status: INITIAL  4.  Stand 20 minutes with pain not > 3/10.  Goal status: INITIAL  5.  Perform ADL's with pain not > 3-4/10.  Goal status: INITIAL  PLAN:  PT FREQUENCY: 2x/week  PT DURATION: 6 weeks  PLANNED INTERVENTIONS: Therapeutic exercises, Therapeutic activity, Neuromuscular re-education, Patient/Family education, Self Care, Joint mobilization, Dry Needling, Electrical stimulation, Spinal mobilization, Cryotherapy, Moist heat, Traction, Ultrasound, and Manual therapy.  PLAN FOR NEXT SESSION: Combo e'stim/US, STW/M, core exercise progression, spinal protection techniques and body mechanics training.  Consider intermittent lumbar traction beginning at 35% body weight.  Marvell Fuller, PTA 01/12/2023, 11:53 AM

## 2023-01-13 ENCOUNTER — Ambulatory Visit: Payer: Medicare Other | Admitting: Physical Therapy

## 2023-01-13 DIAGNOSIS — M5459 Other low back pain: Secondary | ICD-10-CM | POA: Diagnosis not present

## 2023-01-13 DIAGNOSIS — M62830 Muscle spasm of back: Secondary | ICD-10-CM

## 2023-01-13 NOTE — Therapy (Signed)
OUTPATIENT PHYSICAL THERAPY THORACOLUMBAR TREATMENT   Patient Name: [name] MRN: [id number] DOB:1945/12/22, 77 y.o., male Today's Date: 01/13/2023  END OF SESSION:  PT End of Session - 01/13/23 1739     Visit Number 4    Number of Visits 12    Date for PT Re-Evaluation 02/19/23    PT Start Time 0400    PT Stop Time 0459    PT Time Calculation (min) 59 min    Activity Tolerance Patient tolerated treatment well    Behavior During Therapy [facility] for tasks assessed/performed            Past Medical History:  Diagnosis Date   ALLERGIC RHINITIS CAUSE UNSPECIFIED 05/27/2010   Arthritis    BACK PAIN, UPPER 02/21/2010   Blood transfusion without reported diagnosis    with knee replacement   Cervicalgia 05/27/2010   CHEST PAIN UNSPECIFIED 04/25/2009   DIVERTICULOSIS, COLON 03/09/2007   DIZZINESS, CHRONIC 03/09/2007   GERD 12/24/2006   HEADACHE, SINUS 04/06/2009   History of colon polyps    HYPERLIPIDEMIA 12/24/2006   diet controlled   HYPERTENSION 12/24/2006   better after weight loss   INSOMNIA WITH SLEEP APNEA UNSPECIFIED 05/27/2010   Leukocytosis 12/08/2019   Motion sickness    Neuromuscular disorder (HCC)    tremor   Neuropathy    Open wound of wrist, complicated 10/13/2007   OSTEOARTHRITIS 12/24/2006   Other constipation 11/06/2009   Pneumonia    PONV (postoperative nausea and vomiting)    PROSTATITIS, RECURRENT 12/30/2007   SHOULDER PAIN, BILATERAL 07/16/2007   Sleep apnea    Squamous cell carcinoma, ear, left    TRANSIENT ISCHEMIC ATTACKS, HX OF 04/06/2009   Past Surgical History:  Procedure Laterality Date   ANKLE ARTHROSCOPY W/ OPEN REPAIR  2002   Repair   COLONOSCOPY     COLONOSCOPY     around 2017. [place]    CYST EXCISION     from back 2015. 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   Tremor 04/10/2014   Elevated hemoglobin A1c 04/10/2014   Solitary pulmonary nodule 04/10/2014   Constipation, slow transit 08/14/2010   Allergic rhinitis 05/27/2010   Cervicalgia 05/27/2010   Insomnia with sleep apnea 05/27/2010   HEADACHE, SINUS 04/06/2009   SHOULDER PAIN, BILATERAL 07/16/2007   DIZZINESS, CHRONIC 03/09/2007   Hyperlipidemia 12/24/2006   Essential hypertension 12/24/2006   GERD 12/24/2006   Osteoarthritis 12/24/2006   REFERRING PROVIDER: Cameron Sprang PA-C  REFERRING DIAG: Lumbar radiculopathy  Rationale for Evaluation and Treatment: Rehabilitation  THERAPY DIAG:  Other low back pain  Muscle spasm of back  ONSET DATE: ~6 weeks ago.  SUBJECTIVE:  SUBJECTIVE STATEMENT: 10/10 pain.  PERTINENT HISTORY:  LBP in past.  Bilateral TKR's.  PAIN:  Are you having pain? Yes: NPRS scale: 10/10 Pain location: Right LB/hip and lateral thigh. Pain description: Burning and sharp. Aggravating factors: As above. Relieving factors: As above.  PRECAUTIONS: None  RED FLAGS: No.  WEIGHT BEARING RESTRICTIONS: No  PATIENT GOALS: Not have pain.  OBJECTIVE:   POSTURE: rounded shoulders, forward head, decreased lumbar lordosis, and flexed trunk , scoliosis.  PALPATION: Tender to palpation over right SIJ and proximal gluteal musculature.  LUMBAR ROM:  Active lumbar flexion limited by 25% and active extension to 10 degrees.  LOWER EXTREMITY MMT:   Normal LE  DTR's.  LUMBAR SPECIAL TESTS:  Significant leg length discrepancy with right shorter than left.   Normal bilateral Patellar reflexes, normal left Achilles, unable to elicit right Achilles.  GAIT: The patient walking in a flexed trunk posture in obvious pain.  TODAY'S TREATMENT:                                                                                                                              DATE: 01/13/23:  Nustep level 4 x 8 minutes f/b Trigger Point Dry-Needling  Treatment instructions: Expect mild to moderate muscle soreness. S/S of pneumothorax if dry needled over a lung field, and to seek immediate medical attention should they occur. Patient verbalized understanding of these instructions and education. Patient Consent Given: Yes Education handout provided: Yes Muscles treated: Right proximal gluteal musculature.  F/b STW/M x 15 minutes f/b HMP and IFC at 80-150 Hz on 40% scan x 20 minutes.  Normal modality response following removal of modality.  PATIENT EDUCATION:  Education details: Discussed sleeping with pillow between knees to keep hips in alignment. Person educated: Patient Education method: Medical illustrator Education comprehension: verbalized understanding and returned demonstration  HOME EXERCISE PROGRAM:  ASSESSMENT:  CLINICAL IMPRESSION: Patient presented in clinic with reports of intense pain. Dry needling to right proximal gluteal musculature was tolerated without and patient left treatment stating he felt better.    OBJECTIVE IMPAIRMENTS: Abnormal gait, decreased activity tolerance, difficulty walking, decreased ROM, increased muscle spasms, postural dysfunction, and pain.   ACTIVITY LIMITATIONS: carrying, lifting, bending, standing, sleeping, dressing, and locomotion level  PARTICIPATION LIMITATIONS: meal prep, cleaning, laundry, community activity, and yard work  PERSONAL FACTORS: 1 comorbidity: LBP in past  are also affecting patient's  functional outcome.   REHAB POTENTIAL: Good  CLINICAL DECISION MAKING: Stable/uncomplicated  EVALUATION COMPLEXITY: Low  GOALS:  SHORT TERM GOALS: Target date: 01/22/23  Ind with an initial HEP. Goal status: INITIAL   LONG TERM GOALS: Target date: 02/19/23.  Ind with an advanced HEP.  Goal status: INITIAL  2.  Walk a community distance with pain not > 3/10.  Goal status: INITIAL  3.  Eliminate radiation of pain/symptoms into right hip and lateral thigh.  Goal status: INITIAL  4.  Stand 20 minutes with pain not >  3/10.  Goal status: INITIAL  5.  Perform ADL's with pain not > 3-4/10.  Goal status: INITIAL  PLAN:  PT FREQUENCY: 2x/week  PT DURATION: 6 weeks  PLANNED INTERVENTIONS: Therapeutic exercises, Therapeutic activity, Neuromuscular re-education, Patient/Family education, Self Care, Joint mobilization, Dry Needling, Electrical stimulation, Spinal mobilization, Cryotherapy, Moist heat, Traction, Ultrasound, and Manual therapy.  PLAN FOR NEXT SESSION: Combo e'stim/US, STW/M, core exercise progression, spinal protection techniques and body mechanics training.  Consider intermittent lumbar traction beginning at 35% body weight.  Mayte Diers, [place], PT 01/13/2023, 5:51 PM

## 2023-01-15 ENCOUNTER — Telehealth: Payer: Self-pay

## 2023-01-15 ENCOUNTER — Encounter: Payer: Medicare Other | Admitting: Physical Therapy

## 2023-01-15 ENCOUNTER — Ambulatory Visit (AMBULATORY_SURGERY_CENTER): Payer: Medicare Other

## 2023-01-15 VITALS — Ht 68.0 in | Wt 190.0 lb

## 2023-01-15 DIAGNOSIS — Z86010 Personal history of colonic polyps: Secondary | ICD-10-CM

## 2023-01-15 MED ORDER — NA SULFATE-K SULFATE-MG SULF 17.5-3.13-1.6 GM/177ML PO SOLN
1.0000 | Freq: Once | ORAL | 0 refills | Status: AC
Start: 2023-01-15 — End: 2023-01-15

## 2023-01-15 NOTE — Telephone Encounter (Signed)
Called back and was able to complete pre visit.

## 2023-01-15 NOTE — Telephone Encounter (Signed)
Called patient at 3:03 unable to speak with him but left a message that I had called and would try back in a few minutes.

## 2023-01-15 NOTE — Progress Notes (Signed)
No egg or soy allergy known to patient  No issues known to pt with past sedation with any surgeries or procedures Patient denies ever being told they had issues or difficulty with intubation  No FH of Malignant Hyperthermia Pt is not on diet pills Pt is not on  home 02  Pt is not on blood thinners  Pt denies issues with constipation  No A fib or A flutter Have any cardiac testing pending--no Pt instructed to use Single[employer][email or web address] or GoodRx for a price reduction on prep  Patient's chart reviewed by John Nulty CNRA prior to previsit and patient appropriate for the [facility].  Previsit completed and red dot placed by patient's name on their procedure day (on provider's schedule).   Can ambulate independently 

## 2023-01-19 ENCOUNTER — Ambulatory Visit: Payer: Medicare Other | Admitting: Physical Therapy

## 2023-01-19 ENCOUNTER — Encounter: Payer: Self-pay | Admitting: Physical Therapy

## 2023-01-19 ENCOUNTER — Inpatient Hospital Stay: Payer: Medicare Other | Attending: Hematology & Oncology

## 2023-01-19 ENCOUNTER — Encounter: Payer: Self-pay | Admitting: Hematology & Oncology

## 2023-01-19 ENCOUNTER — Other Ambulatory Visit: Payer: Self-pay | Admitting: *Deleted

## 2023-01-19 ENCOUNTER — Other Ambulatory Visit: Payer: Self-pay

## 2023-01-19 ENCOUNTER — Inpatient Hospital Stay (HOSPITAL_BASED_OUTPATIENT_CLINIC_OR_DEPARTMENT_OTHER): Payer: Medicare Other | Admitting: Hematology & Oncology

## 2023-01-19 VITALS — BP 120/75 | HR 107 | Temp 98.4°F | Resp 16 | Ht 68.0 in | Wt 186.0 lb

## 2023-01-19 DIAGNOSIS — M62830 Muscle spasm of back: Secondary | ICD-10-CM

## 2023-01-19 DIAGNOSIS — D72823 Leukemoid reaction: Secondary | ICD-10-CM

## 2023-01-19 DIAGNOSIS — M5459 Other low back pain: Secondary | ICD-10-CM

## 2023-01-19 LAB — CBC WITH DIFFERENTIAL (CANCER CENTER ONLY)
Abs Immature Granulocytes: 0.14 10*3/uL — ABNORMAL HIGH (ref 0.00–0.07)
Basophils Absolute: 0.1 10*3/uL (ref 0.0–0.1)
Basophils Relative: 0 %
Eosinophils Absolute: 0.2 10*3/uL (ref 0.0–0.5)
Eosinophils Relative: 2 %
HCT: 51.1 % (ref 39.0–52.0)
Hemoglobin: 17.1 g/dL — ABNORMAL HIGH (ref 13.0–17.0)
Immature Granulocytes: 1 %
Lymphocytes Relative: 21 %
Lymphs Abs: 2.8 10*3/uL (ref 0.7–4.0)
MCH: 32.1 pg (ref 26.0–34.0)
MCHC: 33.5 g/dL (ref 30.0–36.0)
MCV: 96.1 fL (ref 80.0–100.0)
Monocytes Absolute: 1 10*3/uL (ref 0.1–1.0)
Monocytes Relative: 7 %
Neutro Abs: 9.3 10*3/uL — ABNORMAL HIGH (ref 1.7–7.7)
Neutrophils Relative %: 69 %
Platelet Count: 222 10*3/uL (ref 150–400)
RBC: 5.32 MIL/uL (ref 4.22–5.81)
RDW: 12.7 % (ref 11.5–15.5)
WBC Count: 13.5 10*3/uL — ABNORMAL HIGH (ref 4.0–10.5)
nRBC: 0 % (ref 0.0–0.2)

## 2023-01-19 LAB — CMP (CANCER CENTER ONLY)
ALT: 16 U/L (ref 0–44)
AST: 14 U/L — ABNORMAL LOW (ref 15–41)
Albumin: 4.5 g/dL (ref 3.5–5.0)
Alkaline Phosphatase: 90 U/L (ref 38–126)
Anion gap: 11 (ref 5–15)
BUN: 19 mg/dL (ref 8–23)
CO2: 28 mmol/L (ref 22–32)
Calcium: 10.3 mg/dL (ref 8.9–10.3)
Chloride: 99 mmol/L (ref 98–111)
Creatinine: 1.17 mg/dL (ref 0.61–1.24)
GFR, Estimated: >60 mL/min (ref >60)
Glucose, Bld: 271 mg/dL — ABNORMAL HIGH (ref 70–99)
Potassium: 4.6 mmol/L (ref 3.5–5.1)
Sodium: 138 mmol/L (ref 135–145)
Total Bilirubin: 0.7 mg/dL (ref 0.3–1.2)
Total Protein: 7.3 g/dL (ref 6.5–8.1)

## 2023-01-19 LAB — SAVE SMEAR(SSMR), FOR PROVIDER SLIDE REVIEW

## 2023-01-19 LAB — LACTATE DEHYDROGENASE: LDH: 123 U/L (ref 98–192)

## 2023-01-19 MED ORDER — TRAMADOL HCL 50 MG PO TABS: 50.0000 mg | ORAL_TABLET | Freq: Four times a day (QID) | ORAL | 0 refills | Status: DC | PRN

## 2023-01-19 NOTE — Progress Notes (Signed)
Hematology and Oncology Follow Up Visit  [name] [id number] Sep 15, 1945 77 y.o. 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 [name] is here today for follow-up.  We see him yearly.  Unfortunately, he is having a lot of problems right now.  He has likely a slipped disc in his back.  He says he has pain that goes down to his right ankle.  He had this for about 2 to 3 weeks.  He has had I think cortisone injection.  He is on gabapentin.  These been given Medrol Dosepak.  I will go ahead and give him some Ultram (50 mg p.o. every 6 hours as needed) and see if this may help.  He is due for a MRI next week.  I think he needs surgery, and it sounds like he might, I do not see a problem with this.  His white cell count really has not changed in 1 year.  This is certainly encouraging.  He has had no problem with cough or shortness of breath.  He has had no nausea or vomiting.  Has had no change in bowel or bladder habits.  He has had no bleeding.  There has been no weakness in the legs.  He has had no swelling in the legs.  Overall, I would say that his performance status is probably ECOG 1.     Medications:  Allergies as of 01/19/2023       Reactions   Codeine Nausea Only        Medication List        Accurate as of January 19, 2023  9:49 AM. If you have any questions, ask your nurse or doctor.          acetaminophen 500 MG tablet Commonly known as: TYLENOL Take 2 tablets every 6 hours by oral route as needed.   Acidophilus/Pectin Caps Take 1 capsule every day by oral route in the morning for 30 days.   amitriptyline 50 MG tablet Commonly known as: ELAVIL Take 1 tablet (50 mg total) by mouth at bedtime. What changed: how much to take   aspirin EC 81 MG tablet Take 1 tablet (81 mg total) by mouth daily.   atorvastatin 80 MG tablet Commonly  known as: LIPITOR Take 80 mg by mouth daily.   fluticasone 50 MCG/ACT nasal spray Commonly known as: FLONASE Place 2 sprays into both nostrils daily.   gabapentin 300 MG capsule Commonly known as: NEURONTIN Take by mouth.   lisinopril-hydrochlorothiazide 10-12.5 MG tablet Commonly known as: ZESTORETIC TAKE 1 TABLET(S) EVERY DAY BY ORAL ROUTE IN THE MORNING FOR 90 DAYS.   meclizine 12.5 MG tablet Commonly known as: ANTIVERT Take 1-2 tablets (12.5-25 mg total) by mouth every 6 (six) hours as needed for dizziness.   meloxicam 15 MG tablet Commonly known as: MOBIC TAKE 1 TABLET (15 MG TOTAL) BY MOUTH AS NEEDED FOR PAIN.   niacinamide 500 MG tablet Take 500 mg by mouth daily.   pantoprazole 40 MG tablet Commonly known as: PROTONIX TAKE 1 TABLET BY MOUTH EVERY DAY   promethazine 25 MG tablet Commonly known as: PHENERGAN Take 2 tablets every 8 hours by oral route as needed for 90 days.   Vitamin B-12 1000 MCG Subl PLACE 1 TABLET (1,000 MCG TOTAL) UNDER THE TONGUE DAILY.   Vitamin C 100 MG Chew  Chew 1 tablet by mouth daily.        Allergies:  Allergies  Allergen Reactions   Codeine Nausea Only    Past Medical History, Surgical history, Social history, and Family History were reviewed and updated.  Review of Systems: Review of Systems  Constitutional: Negative.   HENT: Negative.    Eyes: Negative.   Respiratory: Negative.    Cardiovascular: Negative.   Gastrointestinal: Negative.   Genitourinary: Negative.   Musculoskeletal:  Positive for back pain.  Skin: Negative.   Endo/Heme/Allergies: Negative.   Psychiatric/Behavioral: Negative.       Physical Exam:  height is 5\' 8"  (1.727 m) and weight is 186 lb (84.4 kg). His oral temperature is 98.4 F (36.9 C). His blood pressure is 120/75 and his pulse is 107 (abnormal). His respiration is 16 and oxygen saturation is 100%.   Wt Readings from Last 3 Encounters:  01/19/23 186 lb (84.4 kg)  01/15/23 190 lb (86.2  kg)  01/17/22 189 lb (85.7 kg)    Physical Exam Vitals reviewed.  HENT:     Head: Normocephalic and atraumatic.  Eyes:     Pupils: Pupils are equal, round, and reactive to light.  Cardiovascular:     Rate and Rhythm: Normal rate and regular rhythm.     Heart sounds: Normal heart sounds.  Pulmonary:     Effort: Pulmonary effort is normal.     Breath sounds: Normal breath sounds.  Abdominal:     General: Bowel sounds are normal.     Palpations: Abdomen is soft.  Musculoskeletal:        General: No tenderness or deformity. Normal range of motion.     Cervical back: Normal range of motion.  Lymphadenopathy:     Cervical: No cervical adenopathy.  Skin:    General: Skin is warm and dry.     Findings: No erythema or rash.  Neurological:     Mental Status: He is alert and oriented to person, place, and time.  Psychiatric:        Behavior: Behavior normal.        Thought Content: Thought content normal.        Judgment: Judgment normal.      Lab Results  Component Value Date   WBC 13.5 (H) 01/19/2023   HGB 17.1 (H) 01/19/2023   HCT 51.1 01/19/2023   MCV 96.1 01/19/2023   PLT 222 01/19/2023   Lab Results  Component Value Date   FERRITIN 244.0 11/06/2009   IRON 64 11/06/2009   IRONPCTSAT 20.9 11/06/2009   Lab Results  Component Value Date   RBC 5.32 01/19/2023   No results found for: "KPAFRELGTCHN", "LAMBDASER", "KAPLAMBRATIO" Lab Results  Component Value Date   IGA 212 11/06/2009   No results found for: "TOTALPROTELP", "ALBUMINELP", "A1GS", "A2GS", "BETS", "BETA2SER", "GAMS", "MSPIKE", "SPEI"   Chemistry      Component Value Date/Time   NA 134 (L) 01/17/2022 0914   K 4.0 01/17/2022 0914   CL 98 01/17/2022 0914   CO2 26 01/17/2022 0914   BUN 20 01/17/2022 0914   CREATININE 1.33 (H) 01/17/2022 0914      Component Value Date/Time   CALCIUM 9.9 01/17/2022 0914   ALKPHOS 64 01/17/2022 0914   AST 18 01/17/2022 0914   ALT 14 01/17/2022 0914   BILITOT 0.7  01/17/2022 0914       Impression and Plan: Mr. [name] is a very pleasant 77 yo caucasian gentleman with chronic reactive leukocytosis.  Again, he  had a thorough work-up.  He has not had a bone marrow biopsy done which he does not need to have done.  I think the key is that he is negative for JAK2 mutations.  His blood count is holding steady.  Over the past 3 years, there is been really no change in his white cell count.  Looked at his smear under the microscope.  I do not see anything that looks suspicious with his white blood cells.  There are no immature white blood cells.  He had no nucleated red blood cells.  Platelets looked fine.  I still think we can just follow him along yearly.  I think if everything looks stable next year, then maybe we can let him go from the clinic.  Josph Macho, MD 7/22/20249:49 AM

## 2023-01-19 NOTE — Therapy (Addendum)
OUTPATIENT PHYSICAL THERAPY THORACOLUMBAR TREATMENT   Patient Name: [name] MRN: [id number] DOB:[birth year], 77 y.o., male Today's Date: 01/19/2023  END OF SESSION:  PT End of Session - 01/19/23 1349     Visit Number 5    Number of Visits 12    Date for PT Re-Evaluation 02/19/23    PT Start Time 1349    PT Stop Time 1436    PT Time Calculation (min) 47 min    Activity Tolerance Patient tolerated treatment well    Behavior During Therapy [facility] for tasks assessed/performed            Past Medical History:  Diagnosis Date   ALLERGIC RHINITIS CAUSE UNSPECIFIED 05/27/2010   Arthritis    BACK PAIN, UPPER 02/21/2010   Blood transfusion without reported diagnosis    with knee replacement   Cervicalgia 05/27/2010   CHEST PAIN UNSPECIFIED 04/25/2009   DIVERTICULOSIS, COLON 03/09/2007   DIZZINESS, CHRONIC 03/09/2007   GERD 12/24/2006   HEADACHE, SINUS 04/06/2009   History of colon polyps    HYPERLIPIDEMIA 12/24/2006   diet controlled   HYPERTENSION 12/24/2006   better after weight loss   INSOMNIA WITH SLEEP APNEA UNSPECIFIED 05/27/2010   Leukocytosis 12/08/2019   Motion sickness    Neuromuscular disorder (HCC)    tremor   Neuropathy    Open wound of wrist, complicated 10/13/2007   OSTEOARTHRITIS 12/24/2006   Other constipation 11/06/2009   Pneumonia    PONV (postoperative nausea and vomiting)    PROSTATITIS, RECURRENT 12/30/2007   SHOULDER PAIN, BILATERAL 07/16/2007   Sleep apnea    Squamous cell carcinoma, ear, left    TRANSIENT ISCHEMIC ATTACKS, HX OF 04/06/2009   Past Surgical History:  Procedure Laterality Date   ANKLE ARTHROSCOPY W/ OPEN REPAIR  2002   Repair   COLONOSCOPY     COLONOSCOPY     around 2017. [place]    CYST EXCISION     from back 2015. 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   Tremor 04/10/2014   Elevated hemoglobin A1c 04/10/2014   Solitary pulmonary nodule 04/10/2014   Constipation, slow transit 08/14/2010   Allergic rhinitis 05/27/2010   Cervicalgia 05/27/2010   Insomnia with sleep apnea 05/27/2010   HEADACHE, SINUS 04/06/2009   SHOULDER PAIN, BILATERAL 07/16/2007   DIZZINESS, CHRONIC 03/09/2007   Hyperlipidemia 12/24/2006   Essential hypertension 12/24/2006   GERD 12/24/2006   Osteoarthritis 12/24/2006   REFERRING PROVIDER: Cameron Sprang PA-C  REFERRING DIAG: Lumbar radiculopathy  Rationale for Evaluation and Treatment: Rehabilitation  THERAPY DIAG:  Other low back pain  Muscle spasm of back  ONSET DATE: ~6 weeks ago.  SUBJECTIVE:  SUBJECTIVE STATEMENT: Reports he only had minimal short lived relief from DN. Hoping to get an injection tomorrow. Hurting a lot and has to get up in the middle of the night due to pain.  PERTINENT HISTORY:  LBP in past.  Bilateral TKR's.  PAIN:  Are you having pain? Yes: NPRS scale: 10/10 Pain location: Right LB/hip and lateral thigh. Pain description: Burning and sharp. Aggravating factors: As above. Relieving factors: As above.  PRECAUTIONS: None  RED FLAGS: No.  WEIGHT BEARING RESTRICTIONS: No  PATIENT GOALS: Not have pain.  OBJECTIVE:   POSTURE: rounded shoulders, forward head, decreased lumbar lordosis, and flexed trunk , scoliosis.  PALPATION: Tender to palpation over right SIJ and  proximal gluteal musculature.  LUMBAR ROM:  Active lumbar flexion limited by 25% and active extension to 10 degrees.  LOWER EXTREMITY MMT:   Normal LE DTR's.  LUMBAR SPECIAL TESTS:  Significant leg length discrepancy with right shorter than left.   Normal bilateral Patellar reflexes, normal left Achilles, unable to elicit right Achilles.  GAIT: The patient walking in a flexed trunk posture in obvious pain.  TODAY'S TREATMENT:                                                                                                                              DATE: 01/19/23 Modalities  Date: 01/19/23 Unattended Estim: Lumbar, IFC, 10 mins, Pain and Tone Combo: Lumbar, 1.5 w/cm2, 100%, 1 mhz, 10 mins, Pain and Tone  Manual Therapy Soft Tissue Mobilization: R lumbar paraspinals, QL, upper glute, reduce pain and tone    PATIENT EDUCATION:  Education details: Discussed sleeping with pillow between knees to keep hips in alignment. Person educated: Patient Education method: Medical illustrator Education comprehension: verbalized understanding and returned demonstration  HOME EXERCISE PROGRAM:  ASSESSMENT:  CLINICAL IMPRESSION: Patient presented in clinic in considerable pain with highly rated pain with all activities and even while sleeping. Patient seeing minimal response from DN. Increased tone and tenderness palpable along SI joint/ upper glute region. Mod tone palpable in R lumbar paraspinals and QL. Normal modalities response noted following removal of the modalities.  OBJECTIVE IMPAIRMENTS: Abnormal gait, decreased activity tolerance, difficulty walking, decreased ROM, increased muscle spasms, postural dysfunction, and pain.   ACTIVITY LIMITATIONS: carrying, lifting, bending, standing, sleeping, dressing, and locomotion level  PARTICIPATION LIMITATIONS: meal prep, cleaning, laundry, community activity, and yard work  PERSONAL FACTORS: 1 comorbidity: LBP in past  are also  affecting patient's functional outcome.   REHAB POTENTIAL: Good  CLINICAL DECISION MAKING: Stable/uncomplicated  EVALUATION COMPLEXITY: Low  GOALS:  SHORT TERM GOALS: Target date: 01/22/23  Ind with an initial HEP. Goal status: INITIAL   LONG TERM GOALS: Target date: 02/19/23.  Ind with an advanced HEP.  Goal status: INITIAL  2.  Walk a community distance with pain not > 3/10.  Goal status: INITIAL  3.  Eliminate radiation of pain/symptoms into right hip and lateral thigh.  Goal status: INITIAL  4.  Stand 20 minutes with pain not > 3/10.  Goal status: INITIAL  5.  Perform ADL's with pain not > 3-4/10.  Goal status: INITIAL  PLAN:  PT FREQUENCY: 2x/week  PT DURATION: 6 weeks  PLANNED INTERVENTIONS: Therapeutic exercises, Therapeutic activity, Neuromuscular re-education, Patient/Family education, Self Care, Joint mobilization, Dry Needling, Electrical stimulation, Spinal mobilization, Cryotherapy, Moist heat, Traction, Ultrasound, and Manual therapy.  PLAN FOR NEXT SESSION: Combo e'stim/US, STW/M, core exercise progression, spinal protection techniques and body mechanics training.  Consider intermittent lumbar traction beginning at 35% body weight.  Marvell Fuller, PTA 01/19/2023, 2:48 PM

## 2023-01-21 ENCOUNTER — Other Ambulatory Visit: Payer: Medicare Other

## 2023-01-21 ENCOUNTER — Ambulatory Visit: Payer: Medicare Other | Admitting: Physical Therapy

## 2023-01-21 ENCOUNTER — Encounter: Payer: Self-pay | Admitting: Physical Therapy

## 2023-01-21 DIAGNOSIS — M5459 Other low back pain: Secondary | ICD-10-CM

## 2023-01-21 DIAGNOSIS — R293 Abnormal posture: Secondary | ICD-10-CM

## 2023-01-21 DIAGNOSIS — M62830 Muscle spasm of back: Secondary | ICD-10-CM

## 2023-01-21 NOTE — Therapy (Signed)
OUTPATIENT PHYSICAL THERAPY THORACOLUMBAR TREATMENT   Patient Name: [name] MRN: [id number] DOB:Aug 26, 1945, 77 y.o., male Today's Date: 01/21/2023  END OF SESSION:  PT End of Session - 01/21/23 1010     Visit Number 6    Number of Visits 12    Date for PT Re-Evaluation 02/19/23    PT Start Time 1019    PT Stop Time 1100    PT Time Calculation (min) 41 min    Activity Tolerance Patient tolerated treatment well    Behavior During Therapy [facility] for tasks assessed/performed            Past Medical History:  Diagnosis Date   ALLERGIC RHINITIS CAUSE UNSPECIFIED 05/27/2010   Arthritis    BACK PAIN, UPPER 02/21/2010   Blood transfusion without reported diagnosis    with knee replacement   Cervicalgia 05/27/2010   CHEST PAIN UNSPECIFIED 04/25/2009   DIVERTICULOSIS, COLON 03/09/2007   DIZZINESS, CHRONIC 03/09/2007   GERD 12/24/2006   HEADACHE, SINUS 04/06/2009   History of colon polyps    HYPERLIPIDEMIA 12/24/2006   diet controlled   HYPERTENSION 12/24/2006   better after weight loss   INSOMNIA WITH SLEEP APNEA UNSPECIFIED 05/27/2010   Leukocytosis 12/08/2019   Motion sickness    Neuromuscular disorder (HCC)    tremor   Neuropathy    Open wound of wrist, complicated 10/13/2007   OSTEOARTHRITIS 12/24/2006   Other constipation 11/06/2009   Pneumonia    PONV (postoperative nausea and vomiting)    PROSTATITIS, RECURRENT 12/30/2007   SHOULDER PAIN, BILATERAL 07/16/2007   Sleep apnea    Squamous cell carcinoma, ear, left    TRANSIENT ISCHEMIC ATTACKS, HX OF 04/06/2009   Past Surgical History:  Procedure Laterality Date   ANKLE ARTHROSCOPY W/ OPEN REPAIR  2002   Repair   COLONOSCOPY     COLONOSCOPY     around 2017. [place]    CYST EXCISION     from back 2015. 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   Tremor 04/10/2014   Elevated hemoglobin A1c 04/10/2014   Solitary pulmonary nodule 04/10/2014   Constipation, slow transit 08/14/2010   Allergic rhinitis 05/27/2010   Cervicalgia 05/27/2010   Insomnia with sleep apnea 05/27/2010   HEADACHE, SINUS 04/06/2009   SHOULDER PAIN, BILATERAL 07/16/2007   DIZZINESS, CHRONIC 03/09/2007   Hyperlipidemia 12/24/2006   Essential hypertension 12/24/2006   GERD 12/24/2006   Osteoarthritis 12/24/2006   REFERRING PROVIDER: Cameron Sprang PA-C  REFERRING DIAG: Lumbar radiculopathy  Rationale for Evaluation and Treatment: Rehabilitation  THERAPY DIAG:  Other low back pain  Muscle spasm of back  Abnormal posture  ONSET DATE: ~6 weeks ago.  SUBJECTIVE:  SUBJECTIVE STATEMENT: Was unable to get injection yesterday but they did prescribe higher prescription med. Will take after he returns home.  PERTINENT HISTORY:  LBP in past.  Bilateral TKR's.  PAIN:  Are you having pain? Yes: NPRS scale: 10/10 Pain location: Right LB/hip and lateral thigh. Pain description: Burning and sharp. Aggravating factors: As above. Relieving factors: As above.  PRECAUTIONS: None  RED FLAGS: No.  WEIGHT BEARING RESTRICTIONS: No  PATIENT GOALS: Not have pain.  OBJECTIVE:   POSTURE: rounded shoulders, forward head, decreased lumbar lordosis, and flexed trunk , scoliosis.  PALPATION: Tender to palpation over right SIJ and proximal gluteal  musculature.  LUMBAR ROM:  Active lumbar flexion limited by 25% and active extension to 10 degrees.  LOWER EXTREMITY MMT:   Normal LE DTR's.  LUMBAR SPECIAL TESTS:  Significant leg length discrepancy with right shorter than left.   Normal bilateral Patellar reflexes, normal left Achilles, unable to elicit right Achilles.  GAIT: The patient walking in a flexed trunk posture in obvious pain.  TODAY'S TREATMENT:                                                                                                                              DATE: 01/21/23 Modalities  Date: 01/21/23 Unattended Estim: Lumbar, IFC, 10 mins, Pain and Tone Combo: Lumbar, 1.5 w/cm2, 100%, 1 mhz, 10 mins, Pain and Tone  Manual Therapy Soft Tissue Mobilization: R lumbar paraspinals, QL, upper glute, reduce pain and tone    PATIENT EDUCATION:  Education details: Discussed sleeping with pillow between knees to keep hips in alignment. Person educated: Patient Education method: Medical illustrator Education comprehension: verbalized understanding and returned demonstration  HOME EXERCISE PROGRAM:  ASSESSMENT:  CLINICAL IMPRESSION: Patient presented in clinic with reports of increased pain and overall limitations due to pain. Patient unable to get an injection at this time but MD office did call in pain medication to allow patient to cope until MRI and following visit. Mod tone palpable in R QL region today and superior glute. Normal modalities response noted following removal of the modalities.  OBJECTIVE IMPAIRMENTS: Abnormal gait, decreased activity tolerance, difficulty walking, decreased ROM, increased muscle spasms, postural dysfunction, and pain.   ACTIVITY LIMITATIONS: carrying, lifting, bending, standing, sleeping, dressing, and locomotion level  PARTICIPATION LIMITATIONS: meal prep, cleaning, laundry, community activity, and yard work  PERSONAL FACTORS: 1 comorbidity: LBP in past  are also  affecting patient's functional outcome.   REHAB POTENTIAL: Good  CLINICAL DECISION MAKING: Stable/uncomplicated  EVALUATION COMPLEXITY: Low  GOALS:  SHORT TERM GOALS: Target date: 01/22/23  Ind with an initial HEP. Goal status: INITIAL   LONG TERM GOALS: Target date: 02/19/23.  Ind with an advanced HEP.  Goal status: INITIAL  2.  Walk a community distance with pain not > 3/10.  Goal status: INITIAL  3.  Eliminate radiation of pain/symptoms into right hip and lateral thigh.  Goal status: INITIAL  4.  Stand 20  minutes with pain not > 3/10.  Goal status: INITIAL  5.  Perform ADL's with pain not > 3-4/10.  Goal status: INITIAL  PLAN:  PT FREQUENCY: 2x/week  PT DURATION: 6 weeks  PLANNED INTERVENTIONS: Therapeutic exercises, Therapeutic activity, Neuromuscular re-education, Patient/Family education, Self Care, Joint mobilization, Dry Needling, Electrical stimulation, Spinal mobilization, Cryotherapy, Moist heat, Traction, Ultrasound, and Manual therapy.  PLAN FOR NEXT SESSION: Combo e'stim/US, STW/M, core exercise progression, spinal protection techniques and body mechanics training.  Consider intermittent lumbar traction beginning at 35% body weight.  Marvell Fuller, PTA 01/21/2023, 11:59 AM

## 2023-01-22 ENCOUNTER — Other Ambulatory Visit: Payer: Medicare Other

## 2023-01-23 ENCOUNTER — Emergency Department (HOSPITAL_COMMUNITY): Payer: No Typology Code available for payment source

## 2023-01-23 ENCOUNTER — Emergency Department (HOSPITAL_COMMUNITY)
Admission: EM | Admit: 2023-01-23 | Discharge: 2023-01-23 | Disposition: A | Discharge status: Home / Self Care | Payer: No Typology Code available for payment source | Attending: Emergency Medicine | Admitting: Emergency Medicine

## 2023-01-23 ENCOUNTER — Encounter (HOSPITAL_COMMUNITY): Payer: Self-pay

## 2023-01-23 ENCOUNTER — Other Ambulatory Visit: Payer: Self-pay

## 2023-01-23 DIAGNOSIS — M545 Low back pain, unspecified: Secondary | ICD-10-CM | POA: Diagnosis present

## 2023-01-23 DIAGNOSIS — M5416 Radiculopathy, lumbar region: Secondary | ICD-10-CM | POA: Diagnosis not present

## 2023-01-23 DIAGNOSIS — Z7982 Long term (current) use of aspirin: Secondary | ICD-10-CM | POA: Insufficient documentation

## 2023-01-23 MED ORDER — IBUPROFEN 400 MG PO TABS: 600.0000 mg | ORAL_TABLET | Freq: Three times a day (TID) | ORAL | 0 refills | Status: AC

## 2023-01-23 MED ORDER — HYDROMORPHONE HCL 1 MG/ML IJ SOLN
1.0000 mg | Freq: Once | INTRAMUSCULAR | Status: AC
  Administered 2023-01-23: 1 mg via INTRAMUSCULAR
  Filled 2023-01-23: qty 1

## 2023-01-23 MED ORDER — OXYCODONE-ACETAMINOPHEN 5-325 MG PO TABS: 1.0000 | ORAL_TABLET | Freq: Two times a day (BID) | ORAL | 0 refills | Status: AC | PRN

## 2023-01-23 MED ORDER — TIZANIDINE HCL 4 MG PO CAPS: 4.0000 mg | ORAL_CAPSULE | Freq: Two times a day (BID) | ORAL | 0 refills | Status: AC | PRN

## 2023-01-23 MED ORDER — OXYCODONE-ACETAMINOPHEN 5-325 MG PO TABS
2.0000 | ORAL_TABLET | Freq: Once | ORAL | Status: AC
  Administered 2023-01-23: 2 via ORAL
  Filled 2023-01-23: qty 2

## 2023-01-23 MED ORDER — DIAZEPAM 2 MG PO TABS
2.0000 mg | ORAL_TABLET | Freq: Once | ORAL | Status: AC
  Administered 2023-01-23: 2 mg via ORAL
  Filled 2023-01-23: qty 1

## 2023-01-23 MED ORDER — NAPROXEN 250 MG PO TABS
500.0000 mg | ORAL_TABLET | Freq: Once | ORAL | Status: AC
  Administered 2023-01-23: 500 mg via ORAL
  Filled 2023-01-23: qty 2

## 2023-01-23 MED ORDER — DEXAMETHASONE SODIUM PHOSPHATE 10 MG/ML IJ SOLN
10.0000 mg | Freq: Once | INTRAMUSCULAR | Status: AC
  Administered 2023-01-23: 10 mg via INTRAMUSCULAR
  Filled 2023-01-23: qty 1

## 2023-01-23 MED ORDER — DIAZEPAM 5 MG PO TABS: 5.0000 mg | ORAL_TABLET | Freq: Once | ORAL | Status: DC

## 2023-01-23 MED ORDER — ACETAMINOPHEN 500 MG PO TABS: 500.0000 mg | ORAL_TABLET | Freq: Four times a day (QID) | ORAL | 0 refills | Status: AC | PRN

## 2023-01-23 NOTE — ED Provider Triage Note (Signed)
Emergency Medicine Provider Triage Evaluation Note  [name] , a 77 y.o. male  was evaluated in triage.  Pt complains of pain in his low back that radiates down his right leg.  Has been going on for several weeks.  He has been seen twice by his spine doctor and once at the [place] urgent care.  He has dubiously given a steroid injection followed by a course of steroids orally.  He is currently on Neurontin and hydrocodone.  He says his pain continues to get worse.  He denies any numbness or weakness in the leg.  He has been waiting to get an MRI for an epidural injection but he says it keeps getting canceled.  He came here hoping that he could get an MRI because he was post to get an outpatient one today and it got canceled again.  He can get it scheduled again till next week.  Review of Systems  Positive: Back pain Negative: Fevers, numbness, weakness, loss of bowel or bladder control  Physical Exam  BP (!) 130/100 (BP Location: Left Arm)   Pulse (!) 113   Temp 98.4 F (36.9 C) (Oral)   Resp 16   Ht 5\' 8"  (1.727 m)   Wt 84.4 kg   SpO2 96%   BMI 28.29 kg/m  Gen:   Awake, no distress    Resp:  Normal effort   MSK:   Moves extremities without difficulty   Other:     Medical Decision Making  Medically screening exam initiated at 9:59 AM.  Appropriate orders placed.  Ason Reeves Forth was informed that the remainder of the evaluation will be completed by another provider, this initial triage assessment does not replace that evaluation, and the importance of remaining in the ED until their evaluation is complete.      Rolan Bucco, MD 01/23/23 1001

## 2023-01-23 NOTE — Discharge Instructions (Addendum)
You were seen in the ER for worsening back pain.  The MRI was to the emergent the and it reveals evidence of disc disease and nerve impingement.  Take the medicines prescribed. See the spine doctor as soon as possible.  MRI impression is as following: 1. Right central disc extrusion with caudal migration at L5-S1  compresses the traversing right S1 nerve root in the lateral recess.  2. Right eccentric disc bulge and facet arthropathy at L4-5 results  in displacement of the traversing right L5 nerve root in the  subarticular zone and moderate right neural foraminal narrowing.  3. Retrolisthesis with superimposed disc bulge at L3-4 displaces the  traversing left L4 nerve root in the subarticular zone.

## 2023-01-23 NOTE — ED Triage Notes (Signed)
Pt c/o right lower back pain that radiates down right leg down to foot. Pt states was doing toe touches a few weeks ago and was given pain meds and steroids, but it didn't help. Pt went again and was given muscle relaxer, but hasn't helped and was referred PT and has gone 4 times and it hasn't helped.

## 2023-01-23 NOTE — ED Provider Notes (Signed)
[facility] EMERGENCY DEPARTMENT AT [facility] Provider Note   CSN: [id number] Arrival date & time: 01/23/23  0930     History  Chief Complaint  Patient presents with   Back Pain    [name] is a 77 y.o. male.  HPI     77 year old male comes in with chief complaint of back pain.  Patient has had a persistent back pain now for the last 3 weeks.  He describes the pain as sharp, severe, radiating down his right lower extremity up to the ankle.  Pain is often burning in nature.  He denies any associated numbness or tingling.  Patient has seen his PCP at the [place].  He is seeing a spine doctor as well.  Unfortunately his MRI has been delayed for various reasons.  He has taken pain medication has also received a shot of pain medicine.  Despite all of these measures, he continues to have significant pain, preventing him from functioning properly and affecting his sleep.  He therefore came to the ER for some relief as he is out of pain meds.  Patient has tried meloxicam, Tylenol, hydrocodone and a muscle relaxant.  He currently has no narcotic medicine available.  Plan is for his spine doctor to consider additional shots once his MRI is done.  Home Medications Prior to Admission medications   Medication Sig Start Date End Date Taking? Authorizing Provider  acetaminophen (TYLENOL) 500 MG tablet Take 1 tablet (500 mg total) by mouth every 6 (six) hours as needed. 01/23/23  Yes Derwood Kaplan, MD  ibuprofen (ADVIL) 400 MG tablet Take 1.5 tablets (600 mg total) by mouth 3 (three) times daily. 01/23/23  Yes Derwood Kaplan, MD  oxyCODONE-acetaminophen (PERCOCET/ROXICET) 5-325 MG tablet Take 1 tablet by mouth every 12 (twelve) hours as needed for severe pain. 01/23/23  Yes Derwood Kaplan, MD  tiZANidine (ZANAFLEX) 4 MG capsule Take 1 capsule (4 mg total) by mouth 2 (two) times daily as needed for muscle spasms. 01/23/23  Yes Derwood Kaplan, MD  amitriptyline (ELAVIL) 50 MG tablet Take 1  tablet (50 mg total) by mouth at bedtime. Patient taking differently: Take 75 mg by mouth at bedtime. 06/07/18   Wynn Banker, MD  Ascorbic Acid (VITAMIN C) 100 MG CHEW Chew 1 tablet by mouth daily.    [provider]  aspirin EC 81 MG EC tablet Take 1 tablet (81 mg total) by mouth daily. 03/06/14   Lonia Blood, MD  atorvastatin (LIPITOR) 80 MG tablet Take 80 mg by mouth daily.    [provider]  Cyanocobalamin (VITAMIN B-12) 1000 MCG SUBL PLACE 1 TABLET (1,000 MCG TOTAL) UNDER THE TONGUE DAILY. 01/25/19   Koberlein, Paris Lore, MD  fluticasone (FLONASE) 50 MCG/ACT nasal spray Place 2 sprays into both nostrils daily. Patient not taking: Reported on 01/15/2023 05/07/18   Wynn Banker, MD  gabapentin (NEURONTIN) 300 MG capsule Take by mouth. 01/06/23   [provider]  Lactobacillus Acid-Pectin (ACIDOPHILUS/PECTIN) CAPS Take 1 capsule every day by oral route in the morning for 30 days. 11/03/16   [provider]  lisinopril-hydrochlorothiazide (ZESTORETIC) 10-12.5 MG tablet TAKE 1 TABLET(S) EVERY DAY BY ORAL ROUTE IN THE MORNING FOR 90 DAYS. 10/26/17   [provider]  meclizine (ANTIVERT) 12.5 MG tablet Take 1-2 tablets (12.5-25 mg total) by mouth every 6 (six) hours as needed for dizziness. Patient not taking: Reported on 01/15/2023 05/07/18   Wynn Banker, MD  niacinamide 500 MG  tablet Take 500 mg by mouth daily.    [provider]  pantoprazole (PROTONIX) 40 MG tablet TAKE 1 TABLET BY MOUTH EVERY DAY 10/22/18   Koberlein, Paris Lore, MD  promethazine (PHENERGAN) 25 MG tablet Take 2 tablets every 8 hours by oral route as needed for 90 days.    [provider]      Allergies    Codeine    Review of Systems   Review of Systems  All other systems reviewed and are negative.   Physical Exam Updated Vital Signs BP 130/78   Pulse 75   Temp 98.4 F (36.9 C) (Oral)   Resp 16   Ht 5\' 8"  (1.727 m)   Wt 84.4 kg   SpO2  98%   BMI 28.29 kg/m  Physical Exam Vitals and nursing note reviewed.  Constitutional:      Appearance: He is well-developed.  HENT:     Head: Atraumatic.  Cardiovascular:     Rate and Rhythm: Normal rate.  Pulmonary:     Effort: Pulmonary effort is normal.  Musculoskeletal:     Cervical back: Neck supple.     Comments: Lower lumbar spine tenderness  Skin:    General: Skin is warm.  Neurological:     Mental Status: He is alert and oriented to person, place, and time.     ED Results / Procedures / Treatments   Labs (all labs ordered are listed, but only abnormal results are displayed) Labs Reviewed - No data to display  EKG None  Radiology MR LUMBAR SPINE WO CONTRAST  Result Date: 01/23/2023 CLINICAL DATA:  Low back pain, symptoms persist with > 6 wks treatment. Low back pain radiating down right leg. EXAM: MRI LUMBAR SPINE WITHOUT CONTRAST TECHNIQUE: Multiplanar, multisequence MR imaging of the lumbar spine was performed. No intravenous contrast was administered. COMPARISON:  Lumbar spine MRI 09/13/2012. FINDINGS: Segmentation: Conventional numbering is assumed with 5 non-rib-bearing, lumbar type vertebral bodies. Alignment:  Unchanged trace stair step retrolisthesis from L1-L4. Vertebrae: Unchanged benign vertebral hemangioma in the L3 vertebral body. Normal vertebral body heights. Modic type 1 degenerative endplate marrow signal changes at L3-4. Conus medullaris and cauda equina: Conus extends to the L1 level. Conus and cauda equina appear normal. Paraspinal and other soft tissues: Moderate fatty atrophy of the paraspinal muscles. Disc levels: T12-L1:  Unremarkable. L1-L2:  Unremarkable. L2-L3: Small disc bulge and mild bilateral facet arthropathy. No spinal canal stenosis or neural foraminal narrowing. L3-L4: Retrolisthesis with superimposed disc bulge displaces the traversing left L4 nerve root in the subarticular zone. Moderate bilateral facet arthropathy. No neural foraminal  narrowing. L4-L5: Right eccentric disc bulge and facet arthropathy results in displacement of the traversing right L5 nerve root subarticular zone and moderate right neural foraminal narrowing. L5-S1: Right central disc extrusion with caudal migration compresses the traversing right S1 nerve root in the lateral recess. Moderate bilateral facet arthropathy contributes to moderate right neural foraminal narrowing. IMPRESSION: 1. Right central disc extrusion with caudal migration at L5-S1 compresses the traversing right S1 nerve root in the lateral recess. 2. Right eccentric disc bulge and facet arthropathy at L4-5 results in displacement of the traversing right L5 nerve root in the subarticular zone and moderate right neural foraminal narrowing. 3. Retrolisthesis with superimposed disc bulge at L3-4 displaces the traversing left L4 nerve root in the subarticular zone. Electronically Signed   By: Orvan Falconer M.D.   On: 01/23/2023 11:36    Procedures Procedures    Medications Ordered  in ED Medications  dexamethasone (DECADRON) injection 10 mg (has no administration in time range)  HYDROmorphone (DILAUDID) injection 1 mg (has no administration in time range)  naproxen (NAPROSYN) tablet 500 mg (has no administration in time range)  diazepam (VALIUM) tablet 2 mg (has no administration in time range)  oxyCODONE-acetaminophen (PERCOCET/ROXICET) 5-325 MG per tablet 2 tablet (2 tablets Oral Given 01/23/23 1006)    ED Course/ Medical Decision Making/ A&P                             Medical Decision Making Risk OTC drugs. Prescription drug management.   77 year old male comes in with chief complaint of persistent lumbar spine pain that is worsening.  He has no red flags for cauda equina, however he needs an emergent MRI because of worsening pain, that has not responded to narcotic pain medicine, over-the-counter pain medicine and even IM shots in his spine.  Patient clearly has neuropathic component  to the pain.  He is taking gabapentin right now.  The diagnosis is sciatica, spinal stenosis with nerve impingement, disc disease/protrusion with nerve impingement.  MRI of the lumbar spine was completed already.  It confirms radiculopathy.  Patient will receive IM dexamethasone here along with some pain medications.  MRI copy will be provided to him so that he can take it to his spine doctor.  Final Clinical Impression(s) / ED Diagnoses Final diagnoses:  Lumbar radicular syndrome    Rx / DC Orders ED Discharge Orders          Ordered    oxyCODONE-acetaminophen (PERCOCET/ROXICET) 5-325 MG tablet  Every 12 hours PRN        01/23/23 1219    tiZANidine (ZANAFLEX) 4 MG capsule  2 times daily PRN        01/23/23 1219    ibuprofen (ADVIL) 400 MG tablet  3 times daily        01/23/23 1219    acetaminophen (TYLENOL) 500 MG tablet  Every 6 hours PRN        01/23/23 1219              Derwood Kaplan, MD 01/23/23 1223

## 2023-01-26 ENCOUNTER — Other Ambulatory Visit: Payer: Medicare Other

## 2023-01-27 ENCOUNTER — Ambulatory Visit: Payer: Medicare Other | Admitting: Physical Therapy

## 2023-01-27 DIAGNOSIS — M5459 Other low back pain: Secondary | ICD-10-CM | POA: Diagnosis not present

## 2023-01-27 DIAGNOSIS — M62830 Muscle spasm of back: Secondary | ICD-10-CM

## 2023-01-27 DIAGNOSIS — R293 Abnormal posture: Secondary | ICD-10-CM

## 2023-01-27 NOTE — Therapy (Signed)
OUTPATIENT PHYSICAL THERAPY THORACOLUMBAR TREATMENT   Patient Name: [name] MRN: [id number] DOB:1945/12/07, 77 y.o., male Today's Date: 01/27/2023  END OF SESSION:  PT End of Session - 01/27/23 1150     Visit Number 7    Number of Visits 12    Date for PT Re-Evaluation 02/19/23    PT Start Time 1018    PT Stop Time 1111    PT Time Calculation (min) 53 min    Activity Tolerance Patient tolerated treatment well    Behavior During Therapy [facility] for tasks assessed/performed            Past Medical History:  Diagnosis Date   ALLERGIC RHINITIS CAUSE UNSPECIFIED 05/27/2010   Arthritis    BACK PAIN, UPPER 02/21/2010   Blood transfusion without reported diagnosis    with knee replacement   Cervicalgia 05/27/2010   CHEST PAIN UNSPECIFIED 04/25/2009   DIVERTICULOSIS, COLON 03/09/2007   DIZZINESS, CHRONIC 03/09/2007   GERD 12/24/2006   HEADACHE, SINUS 04/06/2009   History of colon polyps    HYPERLIPIDEMIA 12/24/2006   diet controlled   HYPERTENSION 12/24/2006   better after weight loss   INSOMNIA WITH SLEEP APNEA UNSPECIFIED 05/27/2010   Leukocytosis 12/08/2019   Motion sickness    Neuromuscular disorder (HCC)    tremor   Neuropathy    Open wound of wrist, complicated 10/13/2007   OSTEOARTHRITIS 12/24/2006   Other constipation 11/06/2009   Pneumonia    PONV (postoperative nausea and vomiting)    PROSTATITIS, RECURRENT 12/30/2007   SHOULDER PAIN, BILATERAL 07/16/2007   Sleep apnea    Squamous cell carcinoma, ear, left    TRANSIENT ISCHEMIC ATTACKS, HX OF 04/06/2009   Past Surgical History:  Procedure Laterality Date   ANKLE ARTHROSCOPY W/ OPEN REPAIR  2002   Repair   COLONOSCOPY     COLONOSCOPY     around 2017. [place]    CYST EXCISION     from back 2015. 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   Tremor 04/10/2014   Elevated hemoglobin A1c 04/10/2014   Solitary pulmonary nodule 04/10/2014   Constipation, slow transit 08/14/2010   Allergic rhinitis 05/27/2010   Cervicalgia 05/27/2010   Insomnia with sleep apnea 05/27/2010   HEADACHE, SINUS 04/06/2009   SHOULDER PAIN, BILATERAL 07/16/2007   DIZZINESS, CHRONIC 03/09/2007   Hyperlipidemia 12/24/2006   Essential hypertension 12/24/2006   GERD 12/24/2006   Osteoarthritis 12/24/2006   REFERRING PROVIDER: Cameron Sprang PA-C  REFERRING DIAG: Lumbar radiculopathy  Rationale for Evaluation and Treatment: Rehabilitation  THERAPY DIAG:  Other low back pain  Muscle spasm of back  Abnormal posture  ONSET DATE: ~6 weeks ago.  SUBJECTIVE:  SUBJECTIVE STATEMENT: Had to go to ED. 10/10 pain.  PERTINENT HISTORY:  LBP in past.  Bilateral TKR's.  PAIN:  Are you having pain? Yes: NPRS scale: 10/10 Pain location: Right LB/hip and lateral thigh. Pain description: Burning and sharp. Aggravating factors: As above. Relieving factors: As above.  PRECAUTIONS: None  RED FLAGS: No.  WEIGHT BEARING RESTRICTIONS: No  PATIENT GOALS: Not have pain.  OBJECTIVE:   POSTURE: rounded shoulders, forward head, decreased lumbar lordosis, and flexed trunk , scoliosis.  PALPATION: Tender to palpation over right SIJ and proximal gluteal musculature.  LUMBAR ROM:  Active lumbar flexion limited by 25% and active extension to 10  degrees.  LOWER EXTREMITY MMT:   Normal LE DTR's.  LUMBAR SPECIAL TESTS:  Significant leg length discrepancy with right shorter than left.   Normal bilateral Patellar reflexes, normal left Achilles, unable to elicit right Achilles.  GAIT: The patient walking in a flexed trunk posture in obvious pain.  TODAY'S TREATMENT:                                                                                                                              DATE:  01/27/23:     Patient in left SDLY position with folded pillow between  knees for comfort:  DN to right proximal glut and Iliocostalis lumborum f/b combo e'stim/US at 1.50 w/CM2 x 12 minutes f/b STW/M x 11 minutes f/b HMP and IFC at 80-150 Hz on 40% scan x 20 minutes.  Normal modality response following removal of modality.  PATIENT EDUCATION:  Education details: Discussed sleeping with pillow between knees to keep hips in alignment. Person educated: Patient Education method: Medical illustrator Education comprehension: verbalized understanding and returned demonstration  HOME EXERCISE PROGRAM:  ASSESSMENT:  CLINICAL IMPRESSION: Patient had to go to the ED on 01/23/23 due to severe pain and received injections.  He rates his pain at a 10 today.  He is going to see if her can get an ESI.  He did very well with treatment today and felt better following.    OBJECTIVE IMPAIRMENTS: Abnormal gait, decreased activity tolerance, difficulty walking, decreased ROM, increased muscle spasms, postural dysfunction, and pain.   ACTIVITY LIMITATIONS: carrying, lifting, bending, standing, sleeping, dressing, and locomotion level  PARTICIPATION LIMITATIONS: meal prep, cleaning, laundry, community activity, and yard work  PERSONAL FACTORS: 1 comorbidity: LBP in past  are also affecting patient's functional outcome.   REHAB POTENTIAL: Good  CLINICAL DECISION MAKING: Stable/uncomplicated  EVALUATION COMPLEXITY: Low  GOALS:  SHORT TERM  GOALS: Target date: 01/22/23  Ind with an initial HEP. Goal status: INITIAL   LONG TERM GOALS: Target date: 02/19/23.  Ind with an advanced HEP.  Goal status: INITIAL  2.  Walk a community distance with pain not > 3/10.  Goal status: INITIAL  3.  Eliminate radiation of pain/symptoms into right hip and lateral thigh.  Goal status: INITIAL  4.  Stand 20 minutes with pain not >  3/10.  Goal status: INITIAL  5.  Perform ADL's with pain not > 3-4/10.  Goal status: INITIAL  PLAN:  PT FREQUENCY: 2x/week  PT DURATION: 6 weeks  PLANNED INTERVENTIONS: Therapeutic exercises, Therapeutic activity, Neuromuscular re-education, Patient/Family education, Self Care, Joint mobilization, Dry Needling, Electrical stimulation, Spinal mobilization, Cryotherapy, Moist heat, Traction, Ultrasound, and Manual therapy.  PLAN FOR NEXT SESSION: Combo e'stim/US, STW/M, core exercise progression, spinal protection techniques and body mechanics training.  Consider intermittent lumbar traction beginning at 35% body weight.  Aariya Ferrick, [place], PT 01/27/2023, 12:10 PM

## 2023-01-29 ENCOUNTER — Ambulatory Visit: Payer: Medicare Other | Admitting: Physical Therapy

## 2023-02-02 ENCOUNTER — Ambulatory Visit: Payer: Medicare Other | Attending: *Deleted | Admitting: Physical Therapy

## 2023-02-02 DIAGNOSIS — M62830 Muscle spasm of back: Secondary | ICD-10-CM | POA: Insufficient documentation

## 2023-02-02 DIAGNOSIS — R293 Abnormal posture: Secondary | ICD-10-CM | POA: Diagnosis present

## 2023-02-02 DIAGNOSIS — M5459 Other low back pain: Secondary | ICD-10-CM | POA: Diagnosis present

## 2023-02-02 NOTE — Therapy (Signed)
OUTPATIENT PHYSICAL THERAPY THORACOLUMBAR TREATMENT   Patient Name: [name] MRN: [id number] DOB:1946/02/12, 77 y.o., male Today's Date: 02/02/2023  END OF SESSION:  PT End of Session - 02/02/23 1128     Visit Number 8    Number of Visits 12    Date for PT Re-Evaluation 02/19/23    PT Start Time 1015    PT Stop Time 1108    PT Time Calculation (min) 53 min    Activity Tolerance Patient tolerated treatment well    Behavior During Therapy [facility] for tasks assessed/performed            Past Medical History:  Diagnosis Date   ALLERGIC RHINITIS CAUSE UNSPECIFIED 05/27/2010   Arthritis    BACK PAIN, UPPER 02/21/2010   Blood transfusion without reported diagnosis    with knee replacement   Cervicalgia 05/27/2010   CHEST PAIN UNSPECIFIED 04/25/2009   DIVERTICULOSIS, COLON 03/09/2007   DIZZINESS, CHRONIC 03/09/2007   GERD 12/24/2006   HEADACHE, SINUS 04/06/2009   History of colon polyps    HYPERLIPIDEMIA 12/24/2006   diet controlled   HYPERTENSION 12/24/2006   better after weight loss   INSOMNIA WITH SLEEP APNEA UNSPECIFIED 05/27/2010   Leukocytosis 12/08/2019   Motion sickness    Neuromuscular disorder (HCC)    tremor   Neuropathy    Open wound of wrist, complicated 10/13/2007   OSTEOARTHRITIS 12/24/2006   Other constipation 11/06/2009   Pneumonia    PONV (postoperative nausea and vomiting)    PROSTATITIS, RECURRENT 12/30/2007   SHOULDER PAIN, BILATERAL 07/16/2007   Sleep apnea    Squamous cell carcinoma, ear, left    TRANSIENT ISCHEMIC ATTACKS, HX OF 04/06/2009   Past Surgical History:  Procedure Laterality Date   ANKLE ARTHROSCOPY W/ OPEN REPAIR  2002   Repair   COLONOSCOPY     COLONOSCOPY     around 2017. [place]    CYST EXCISION     from back 2015. 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   Tremor 04/10/2014   Elevated hemoglobin A1c 04/10/2014   Solitary pulmonary nodule 04/10/2014   Constipation, slow transit 08/14/2010   Allergic rhinitis 05/27/2010   Cervicalgia 05/27/2010   Insomnia with sleep apnea 05/27/2010   HEADACHE, SINUS 04/06/2009   SHOULDER PAIN, BILATERAL 07/16/2007   DIZZINESS, CHRONIC 03/09/2007   Hyperlipidemia 12/24/2006   Essential hypertension 12/24/2006   GERD 12/24/2006   Osteoarthritis 12/24/2006   REFERRING PROVIDER: Cameron Sprang PA-C  REFERRING DIAG: Lumbar radiculopathy  Rationale for Evaluation and Treatment: Rehabilitation  THERAPY DIAG:  Other low back pain  Muscle spasm of back  Abnormal posture  ONSET DATE: ~6 weeks ago.  SUBJECTIVE:  SUBJECTIVE STATEMENT: Got an injection in the spine last week that helped for about a day and a half.  PERTINENT HISTORY:  LBP in past.  Bilateral TKR's.  PAIN:  Are you having pain? Yes: NPRS scale: 8/10 Pain location: Right LB/hip and lateral thigh. Pain description: Burning and sharp. Aggravating factors: As above. Relieving factors: As above.  PRECAUTIONS: None  RED FLAGS: No.  WEIGHT BEARING RESTRICTIONS: No  PATIENT GOALS: Not have pain.  OBJECTIVE:   POSTURE: rounded shoulders, forward head, decreased lumbar lordosis, and flexed trunk , scoliosis.  PALPATION: Tender to palpation over right SIJ and proximal gluteal musculature.  LUMBAR ROM:  Active lumbar flexion  limited by 25% and active extension to 10 degrees.  LOWER EXTREMITY MMT:   Normal LE DTR's.  LUMBAR SPECIAL TESTS:  Significant leg length discrepancy with right shorter than left.   Normal bilateral Patellar reflexes, normal left Achilles, unable to elicit right Achilles.  GAIT: The patient walking in a flexed trunk posture in obvious pain.  TODAY'S TREATMENT:                                                                                                                              DATE:  01/27/23:     Patient in left SDLY position with folded pillow between  knees for comfort: [place] at 1.50 w/CM2 x 12 minutes f/b STW/M x 12 minutes f/b HMP and IFC at 80-150 Hz on 40% scan x 15 minutes.  Normal modality response following removal of modality.  PATIENT EDUCATION:  Education details: Discussed sleeping with pillow between knees to keep hips in alignment. Person educated: Patient Education method: Medical illustrator Education comprehension: verbalized understanding and returned demonstration  HOME EXERCISE PROGRAM:  ASSESSMENT:  CLINICAL IMPRESSION: The patient states he received a spinal injection last week that helped for about a day and a half.  He presented to the clinic with an 8/10 pain-level.  We discussed trying traction again as he had only done one treatment of this.  OBJECTIVE IMPAIRMENTS: Abnormal gait, decreased activity tolerance, difficulty walking, decreased ROM, increased muscle spasms, postural dysfunction, and pain.   ACTIVITY LIMITATIONS: carrying, lifting, bending, standing, sleeping, dressing, and locomotion level  PARTICIPATION LIMITATIONS: meal prep, cleaning, laundry, community activity, and yard work  PERSONAL FACTORS: 1 comorbidity: LBP in past  are also affecting patient's functional outcome.   REHAB POTENTIAL: Good  CLINICAL DECISION MAKING: Stable/uncomplicated  EVALUATION COMPLEXITY: Low  GOALS:  SHORT TERM GOALS: Target date:  01/22/23  Ind with an initial HEP. Goal status: INITIAL   LONG TERM GOALS: Target date: 02/19/23.  Ind with an advanced HEP.  Goal status: INITIAL  2.  Walk a community distance with pain not > 3/10.  Goal status: INITIAL  3.  Eliminate radiation of pain/symptoms into right hip and lateral thigh.  Goal status: INITIAL  4.  Stand 20 minutes with pain not > 3/10.  Goal status: INITIAL  5.  Perform ADL's with pain not > 3-4/10.  Goal status: INITIAL  PLAN:  PT FREQUENCY: 2x/week  PT DURATION: 6 weeks  PLANNED INTERVENTIONS: Therapeutic exercises, Therapeutic activity, Neuromuscular re-education, Patient/Family education, Self Care, Joint mobilization, Dry Needling, Electrical stimulation, Spinal mobilization, Cryotherapy, Moist heat, Traction, Ultrasound, and Manual therapy.  PLAN FOR NEXT SESSION: Combo e'stim/US, STW/M, core exercise progression, spinal protection techniques and body mechanics training.  Consider intermittent lumbar traction beginning at 35% body weight.  Maranatha Grossi, [place], PT 02/02/2023, 11:32 AM

## 2023-02-04 ENCOUNTER — Ambulatory Visit: Payer: Medicare Other | Admitting: Physical Therapy

## 2023-02-05 ENCOUNTER — Encounter: Payer: Self-pay | Admitting: Physical Therapy

## 2023-02-05 ENCOUNTER — Ambulatory Visit: Payer: Medicare Other | Admitting: Physical Therapy

## 2023-02-05 DIAGNOSIS — M5459 Other low back pain: Secondary | ICD-10-CM

## 2023-02-05 DIAGNOSIS — M62830 Muscle spasm of back: Secondary | ICD-10-CM

## 2023-02-05 DIAGNOSIS — R293 Abnormal posture: Secondary | ICD-10-CM

## 2023-02-05 NOTE — Therapy (Signed)
OUTPATIENT PHYSICAL THERAPY THORACOLUMBAR TREATMENT   Patient Name: [name] MRN: [id number] DOB:[birth year], 77 y.o., male Today's Date: 02/05/2023  END OF SESSION:  PT End of Session - 02/05/23 1017     Visit Number 9    Number of Visits 12    Date for PT Re-Evaluation 02/19/23    PT Start Time 1017    PT Stop Time 1100    PT Time Calculation (min) 43 min    Activity Tolerance Patient tolerated treatment well    Behavior During Therapy WFL for tasks assessed/performed            Past Medical History:  Diagnosis Date   ALLERGIC RHINITIS CAUSE UNSPECIFIED 05/27/2010   Arthritis    BACK PAIN, UPPER 02/21/2010   Blood transfusion without reported diagnosis    with knee replacement   Cervicalgia 05/27/2010   CHEST PAIN UNSPECIFIED 04/25/2009   DIVERTICULOSIS, COLON 03/09/2007   DIZZINESS, CHRONIC 03/09/2007   GERD 12/24/2006   HEADACHE, SINUS 04/06/2009   History of colon polyps    HYPERLIPIDEMIA 12/24/2006   diet controlled   HYPERTENSION 12/24/2006   better after weight loss   INSOMNIA WITH SLEEP APNEA UNSPECIFIED 05/27/2010   Leukocytosis 12/08/2019   Motion sickness    Neuromuscular disorder (HCC)    tremor   Neuropathy    Open wound of wrist, complicated 10/13/2007   OSTEOARTHRITIS 12/24/2006   Other constipation 11/06/2009   Pneumonia    PONV (postoperative nausea and vomiting)    PROSTATITIS, RECURRENT 12/30/2007   SHOULDER PAIN, BILATERAL 07/16/2007   Sleep apnea    Squamous cell carcinoma, ear, left    TRANSIENT ISCHEMIC ATTACKS, HX OF 04/06/2009   Past Surgical History:  Procedure Laterality Date   ANKLE ARTHROSCOPY W/ OPEN REPAIR  2002   Repair   COLONOSCOPY     COLONOSCOPY     around 2017. [place]    CYST EXCISION     from back 2015. 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   Tremor 04/10/2014   Elevated hemoglobin A1c 04/10/2014   Solitary pulmonary nodule 04/10/2014   Constipation, slow transit 08/14/2010   Allergic rhinitis 05/27/2010   Cervicalgia 05/27/2010   Insomnia with sleep apnea 05/27/2010   HEADACHE, SINUS 04/06/2009   SHOULDER PAIN, BILATERAL 07/16/2007   DIZZINESS, CHRONIC 03/09/2007   Hyperlipidemia 12/24/2006   Essential hypertension 12/24/2006   GERD 12/24/2006   Osteoarthritis 12/24/2006   REFERRING PROVIDER: Cameron Sprang PA-C  REFERRING DIAG: Lumbar radiculopathy  Rationale for Evaluation and Treatment: Rehabilitation  THERAPY DIAG:  Other low back pain  Muscle spasm of back  Abnormal posture  ONSET DATE: ~6 weeks ago.  SUBJECTIVE:  SUBJECTIVE STATEMENT: Hurting still and no pain from knee to foot anymore.  PERTINENT HISTORY:  LBP in past.  Bilateral TKR's.  PAIN:  Are you having pain? Yes: NPRS scale: 8/10 Pain location: Right LB/hip and lateral thigh. Pain description: Burning and sharp. Aggravating factors: As above. Relieving factors: As above.  PRECAUTIONS: None  RED FLAGS: No.  WEIGHT BEARING RESTRICTIONS: No  PATIENT GOALS: Not have pain.  OBJECTIVE:   POSTURE: rounded shoulders, forward head, decreased lumbar lordosis, and flexed trunk , scoliosis.  PALPATION: Tender to palpation over right SIJ and proximal gluteal musculature.  LUMBAR ROM:  Active lumbar flexion limited by 25% and active  extension to 10 degrees.  LOWER EXTREMITY MMT:   Normal LE DTR's.  LUMBAR SPECIAL TESTS:  Significant leg length discrepancy with right shorter than left.   Normal bilateral Patellar reflexes, normal left Achilles, unable to elicit right Achilles.  GAIT: The patient walking in a flexed trunk posture in obvious pain.  TODAY'S TREATMENT:                                                                                                                              DATE: 02/05/23 Modalities  Date: 02/05/23 Unattended Estim: Lumbar, IFC, 15 mins, Pain and Tone Combo: Lumbar, 1.5 w/cm2 ,100%, 1 mhz, 10 mins, Pain and Tone Hot Pack: Lumbar, 15 mins, Pain and Tone  Manual Therapy Soft Tissue Mobilization: R lumbar paraspinals/QL/ superior glute, reduce tone and pain      PATIENT EDUCATION:  Education details: Discussed sleeping with pillow between knees to keep hips in alignment. Person educated: Patient Education method: Medical illustrator Education comprehension: verbalized understanding and returned demonstration  HOME EXERCISE PROGRAM:  ASSESSMENT:  CLINICAL IMPRESSION: Patient presented in clinic with reports of continued LBP even after injection. Patient is to now consult with a surgeon. Patient states that only benefit of injection was pain is no longer from knee to foot. Patient presented with mild tone in R lumbar musculature. Normal modalities response noted following removal of the modalities.  OBJECTIVE IMPAIRMENTS: Abnormal gait, decreased activity tolerance, difficulty walking, decreased ROM, increased muscle spasms, postural dysfunction, and pain.   ACTIVITY LIMITATIONS: carrying, lifting, bending, standing, sleeping, dressing, and locomotion level  PARTICIPATION LIMITATIONS: meal prep, cleaning, laundry, community activity, and yard work  PERSONAL FACTORS: 1 comorbidity: LBP in past  are also affecting patient's functional outcome.   REHAB POTENTIAL:  Good  CLINICAL DECISION MAKING: Stable/uncomplicated  EVALUATION COMPLEXITY: Low  GOALS:  SHORT TERM GOALS: Target date: 01/22/23  Ind with an initial HEP. Goal status: INITIAL   LONG TERM GOALS: Target date: 02/19/23.  Ind with an advanced HEP.  Goal status: INITIAL  2.  Walk a community distance with pain not > 3/10.  Goal status: INITIAL  3.  Eliminate radiation of pain/symptoms into right hip and lateral thigh.  Goal status: INITIAL  4.  Stand 20 minutes with pain not > 3/10.  Goal  status: INITIAL  5.  Perform ADL's with pain not > 3-4/10.  Goal status: INITIAL  PLAN:  PT FREQUENCY: 2x/week  PT DURATION: 6 weeks  PLANNED INTERVENTIONS: Therapeutic exercises, Therapeutic activity, Neuromuscular re-education, Patient/Family education, Self Care, Joint mobilization, Dry Needling, Electrical stimulation, Spinal mobilization, Cryotherapy, Moist heat, Traction, Ultrasound, and Manual therapy.  PLAN FOR NEXT SESSION: Combo e'stim/US, STW/M, core exercise progression, spinal protection techniques and body mechanics training.  Consider intermittent lumbar traction beginning at 35% body weight.  Marvell Fuller, PTA 02/05/2023, 11:22 AM

## 2023-02-09 ENCOUNTER — Encounter: Payer: Self-pay | Admitting: Physical Therapy

## 2023-02-09 ENCOUNTER — Ambulatory Visit: Payer: Medicare Other | Admitting: Physical Therapy

## 2023-02-09 DIAGNOSIS — R293 Abnormal posture: Secondary | ICD-10-CM

## 2023-02-09 DIAGNOSIS — M62830 Muscle spasm of back: Secondary | ICD-10-CM

## 2023-02-09 DIAGNOSIS — M5459 Other low back pain: Secondary | ICD-10-CM | POA: Diagnosis not present

## 2023-02-09 NOTE — Therapy (Addendum)
OUTPATIENT PHYSICAL THERAPY THORACOLUMBAR TREATMENT   Patient Name: [name] MRN: [id number] DOB:1945-08-24, 77 y.o., male Today's Date: 02/09/2023  END OF SESSION:  PT End of Session - 02/09/23 0936     Visit Number 10    Number of Visits 12    Date for PT Re-Evaluation 02/19/23    PT Start Time 0936    PT Stop Time 1035    PT Time Calculation (min) 59 min    Activity Tolerance Patient tolerated treatment well    Behavior During Therapy [facility] for tasks assessed/performed            Past Medical History:  Diagnosis Date   ALLERGIC RHINITIS CAUSE UNSPECIFIED 05/27/2010   Arthritis    BACK PAIN, UPPER 02/21/2010   Blood transfusion without reported diagnosis    with knee replacement   Cervicalgia 05/27/2010   CHEST PAIN UNSPECIFIED 04/25/2009   DIVERTICULOSIS, COLON 03/09/2007   DIZZINESS, CHRONIC 03/09/2007   GERD 12/24/2006   HEADACHE, SINUS 04/06/2009   History of colon polyps    HYPERLIPIDEMIA 12/24/2006   diet controlled   HYPERTENSION 12/24/2006   better after weight loss   INSOMNIA WITH SLEEP APNEA UNSPECIFIED 05/27/2010   Leukocytosis 12/08/2019   Motion sickness    Neuromuscular disorder (HCC)    tremor   Neuropathy    Open wound of wrist, complicated 10/13/2007   OSTEOARTHRITIS 12/24/2006   Other constipation 11/06/2009   Pneumonia    PONV (postoperative nausea and vomiting)    PROSTATITIS, RECURRENT 12/30/2007   SHOULDER PAIN, BILATERAL 07/16/2007   Sleep apnea    Squamous cell carcinoma, ear, left    TRANSIENT ISCHEMIC ATTACKS, HX OF 04/06/2009   Past Surgical History:  Procedure Laterality Date   ANKLE ARTHROSCOPY W/ OPEN REPAIR  2002   Repair   COLONOSCOPY     COLONOSCOPY     around 2017. [place]    CYST EXCISION     from back 2015. 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   Tremor 04/10/2014   Elevated hemoglobin A1c 04/10/2014   Solitary pulmonary nodule 04/10/2014   Constipation, slow transit 08/14/2010   Allergic rhinitis 05/27/2010   Cervicalgia 05/27/2010   Insomnia with sleep apnea 05/27/2010   HEADACHE, SINUS 04/06/2009   SHOULDER PAIN, BILATERAL 07/16/2007   DIZZINESS, CHRONIC 03/09/2007   Hyperlipidemia 12/24/2006   Essential hypertension 12/24/2006   GERD 12/24/2006   Osteoarthritis 12/24/2006   REFERRING PROVIDER: Cameron Sprang PA-C  REFERRING DIAG: Lumbar radiculopathy  Rationale for Evaluation and Treatment: Rehabilitation  THERAPY DIAG:  Other low back pain  Muscle spasm of back  Abnormal posture  ONSET DATE: ~6 weeks ago.  SUBJECTIVE:  SUBJECTIVE STATEMENT: Hurting still and no pain from knee to foot anymore.  PERTINENT HISTORY:  LBP in past.  Bilateral TKR's.  PAIN:  Are you having pain? Yes: NPRS scale: 8/10 Pain location: Right LB/hip and lateral thigh. Pain description: Burning and sharp. Aggravating factors: As above. Relieving factors: As above.  PRECAUTIONS: None  RED FLAGS: No.  WEIGHT BEARING RESTRICTIONS: No  PATIENT GOALS: Not have pain.  OBJECTIVE:   POSTURE: rounded shoulders, forward head, decreased lumbar lordosis, and flexed trunk , scoliosis.  PALPATION: Tender to palpation over right SIJ and proximal gluteal musculature.  LUMBAR ROM:  Active lumbar flexion limited by 25% and active  extension to 10 degrees.  LOWER EXTREMITY MMT:   Normal LE DTR's.  LUMBAR SPECIAL TESTS:  Significant leg length discrepancy with right shorter than left.   Normal bilateral Patellar reflexes, normal left Achilles, unable to elicit right Achilles.  GAIT: The patient walking in a flexed trunk posture in obvious pain.  TODAY'S TREATMENT:                                                                                                                              DATE: 02/09/23 Modalities  Date: 02/09/23 Traction: lumbar, 80# max/ 5# min for 10 min; 90# max for 5 min, 99 pull/ 5 rest, 15 mins mins Combo: Lumbar, 1.5 w/cm2 ,100%, 1 mhz, 10 mins, Pain  Manual Therapy Soft Tissue Mobilization: R lumbar paraspinals/QL/ superior glute, reduce tone and pain    PATIENT EDUCATION:  Education details: Discussed sleeping with pillow between knees to keep hips in alignment. Person educated: Patient Education method: Medical illustrator Education comprehension: verbalized understanding and returned demonstration  HOME EXERCISE PROGRAM:  ASSESSMENT:  CLINICAL IMPRESSION: Patient presented in clinic with reports of continued high level LBP. Patient states that he is going [facility] following today's treatment to try to get another injection. Patient interested in trying traction again to see if that helps symptoms. Patient slow with transfers, transitions and gait due to pain. Mechanical traction initiated again starting with 80# and progressed to 90# per [place] Applegate, MPT.   OBJECTIVE IMPAIRMENTS: Abnormal gait, decreased activity tolerance, difficulty walking, decreased ROM, increased muscle spasms, postural dysfunction, and pain.   ACTIVITY LIMITATIONS: carrying, lifting, bending, standing, sleeping, dressing, and locomotion level  PARTICIPATION LIMITATIONS: meal prep, cleaning, laundry, community activity, and yard work  PERSONAL FACTORS: 1 comorbidity: LBP in past  are  also affecting patient's functional outcome.   REHAB POTENTIAL: Good  CLINICAL DECISION MAKING: Stable/uncomplicated  EVALUATION COMPLEXITY: Low  GOALS:  SHORT TERM GOALS: Target date: 01/22/23  Ind with an initial HEP. Goal status: INITIAL   LONG TERM GOALS: Target date: 02/19/23.  Ind with an advanced HEP.  Goal status: INITIAL  2.  Walk a community distance with pain not > 3/10.  Goal status: INITIAL  3.  Eliminate radiation of pain/symptoms into right hip and lateral thigh.  Goal  status: INITIAL  4.  Stand 20 minutes with pain not > 3/10.  Goal status: INITIAL  5.  Perform ADL's with pain not > 3-4/10.  Goal status: INITIAL  PLAN:  PT FREQUENCY: 2x/week  PT DURATION: 6 weeks  PLANNED INTERVENTIONS: Therapeutic exercises, Therapeutic activity, Neuromuscular re-education, Patient/Family education, Self Care, Joint mobilization, Dry Needling, Electrical stimulation, Spinal mobilization, Cryotherapy, Moist heat, Traction, Ultrasound, and Manual therapy.  PLAN FOR NEXT SESSION: Combo e'stim/US, STW/M, core exercise progression, spinal protection techniques and body mechanics training.  Consider intermittent lumbar traction beginning at 35% body weight.  Marvell Fuller, PTA 02/09/2023, 12:05 PM   Progress Note Reporting Period 01/08/23 to 02/09/23  See note below for Objective Data and Assessment of Progress/Goals. Continued high pain-levels and no goals met.  2 visits remaining and will progress with intermittent lumbar traction.    [place] Applegate MPT

## 2023-02-10 ENCOUNTER — Ambulatory Visit (AMBULATORY_SURGERY_CENTER): Payer: Medicare Other | Admitting: Gastroenterology

## 2023-02-10 ENCOUNTER — Encounter: Payer: Self-pay | Admitting: Gastroenterology

## 2023-02-10 VITALS — BP 130/77 | HR 81 | Temp 97.8°F | Resp 11 | Ht 68.0 in | Wt 190.0 lb

## 2023-02-10 DIAGNOSIS — Z860100 Personal history of colon polyps, unspecified: Secondary | ICD-10-CM

## 2023-02-10 DIAGNOSIS — Z09 Encounter for follow-up examination after completed treatment for conditions other than malignant neoplasm: Secondary | ICD-10-CM | POA: Diagnosis not present

## 2023-02-10 DIAGNOSIS — D122 Benign neoplasm of ascending colon: Secondary | ICD-10-CM

## 2023-02-10 DIAGNOSIS — D124 Benign neoplasm of descending colon: Secondary | ICD-10-CM | POA: Diagnosis not present

## 2023-02-10 DIAGNOSIS — K640 First degree hemorrhoids: Secondary | ICD-10-CM

## 2023-02-10 DIAGNOSIS — K5730 Diverticulosis of large intestine without perforation or abscess without bleeding: Secondary | ICD-10-CM

## 2023-02-10 MED ORDER — SODIUM CHLORIDE 0.9 % IV SOLN: 500.0000 mL | Freq: Once | INTRAVENOUS | Status: DC

## 2023-02-10 NOTE — Progress Notes (Signed)
Called to room to assist during endoscopic procedure.  Patient ID and intended procedure confirmed with present staff. Received instructions for my participation in the procedure from the performing physician.  

## 2023-02-10 NOTE — Progress Notes (Signed)
Pt's states no medical or surgical changes since previsit or office visit. 

## 2023-02-10 NOTE — Op Note (Signed)
[facility] Patient Name: [name] Procedure Date: 02/10/2023 11:06 AM MRN: [id number] Endoscopist: Doristine Locks , MD, [id number] Age: 77 Referring MD:  Date of Birth: 1946/02/23 Gender: Male Account #: [id number] Procedure:                Colonoscopy Indications:              Surveillance: Personal history of adenomatous                            polyps on last colonoscopy 3 years ago                           Last colonoscopy was 12/2019 and notable for 6                            subcentimeter adenomas scattered through the                            descending colon to the cecum, along with benign                            hyperplastic polyps in the rectum and sigmoid                            colon, pandiverticulosis, small internal                            hemorrhoids, with recommendation to repeat in 3                            years. Medicines:                Monitored Anesthesia Care Procedure:                Pre-Anesthesia Assessment:                           - Prior to the procedure, a History and Physical                            was performed, and patient medications and                            allergies were reviewed. The patient's tolerance of                            previous anesthesia was also reviewed. The risks                            and benefits of the procedure and the sedation                            options and risks were discussed with the patient.  All questions were answered, and informed consent                            was obtained. Prior Anticoagulants: The patient has                            taken no anticoagulant or antiplatelet agents. ASA                            Grade Assessment: III - A patient with severe                            systemic disease. After reviewing the risks and                            benefits, the patient was deemed in satisfactory                             condition to undergo the procedure.                           After obtaining informed consent, the colonoscope                            was passed under direct vision. Throughout the                            procedure, the patient's blood pressure, pulse, and                            oxygen saturations were monitored continuously. The                            CF [id number] #[id number] was introduced through the anus                            and advanced to the the cecum, identified by                            appendiceal orifice and ileocecal valve. The                            colonoscopy was performed without difficulty. The                            patient tolerated the procedure well. The quality                            of the bowel preparation was good. The ileocecal                            valve, appendiceal orifice, and rectum were  photographed. Scope In: 11:18:27 AM Scope Out: 11:34:22 AM Scope Withdrawal Time: 0 hours 9 minutes 41 seconds  Total Procedure Duration: 0 hours 15 minutes 55 seconds  Findings:                 The perianal and digital rectal examinations were                            normal.                           Two sessile polyps were found in the descending                            colon and ascending colon. The polyps were 3 to 4                            mm in size. These polyps were removed with a cold                            snare. Resection and retrieval were complete.                            Estimated blood loss was minimal.                           Multiple medium-mouthed and small-mouthed                            diverticula were found in the sigmoid colon,                            descending colon and ascending colon.                           Non-bleeding internal hemorrhoids were found during                            retroflexion. The hemorrhoids were small. Complications:            No  immediate complications. Estimated Blood Loss:     Estimated blood loss was minimal. Impression:               - Two 3 to 4 mm polyps in the descending colon and                            in the ascending colon, removed with a cold snare.                            Resected and retrieved.                           - Diverticulosis in the sigmoid colon, in the                            descending colon and in  the ascending colon.                           - Non-bleeding internal hemorrhoids. Recommendation:           - Patient has a contact number available for                            emergencies. The signs and symptoms of potential                            delayed complications were discussed with the                            patient. Return to normal activities tomorrow.                            Written discharge instructions were provided to the                            patient.                           - Resume previous diet.                           - Continue present medications.                           - Await pathology results.                           - Repeat colonoscopy for surveillance based on                            pathology results.                           - Return to GI clinic PRN. Doristine Locks, MD 02/10/2023 11:41:52 AM

## 2023-02-10 NOTE — Progress Notes (Signed)
Vss nad trans to pacu 

## 2023-02-10 NOTE — Patient Instructions (Signed)
Eat a FOB diet. A handouts was given to you by your recovery room nurse.  Resume all of your previous medications today as ordered.  YOU HAD AN ENDOSCOPIC PROCEDURE TODAY AT THE [facility]:   Refer to the procedure report that was given to you for any specific questions about what was found during the examination.  If the procedure report does not answer your questions, please call your gastroenterologist to clarify.  If you requested that your care partner not be given the details of your procedure findings, then the procedure report has been included in a sealed envelope for you to review at your convenience later.  YOU SHOULD EXPECT: Some feelings of bloating in the abdomen. Passage of more gas than usual.  Walking can help get rid of the air that was put into your GI tract during the procedure and reduce the bloating. If you had a lower endoscopy (such as a colonoscopy or flexible sigmoidoscopy) you may notice spotting of blood in your stool or on the toilet paper. If you underwent a bowel prep for your procedure, you may not have a normal bowel movement for a few days.  Please Note:  You might notice some irritation and congestion in your nose or some drainage.  This is from the oxygen used during your procedure.  There is no need for concern and it should clear up in a day or so.  SYMPTOMS TO REPORT IMMEDIATELY:  Following lower endoscopy (colonoscopy or flexible sigmoidoscopy):  Excessive amounts of blood in the stool  Significant tenderness or worsening of abdominal pains  Swelling of the abdomen that is new, acute  Fever of 100F or higher   For urgent or emergent issues, a gastroenterologist can be reached at any hour by calling [phone]. Do not use MyChart messaging for urgent concerns.    DIET:  We do recommend a small meal at first, but then you may proceed to your regular diet.  Drink plenty of fluids but you should avoid alcoholic beverages for 24 hours. Eat a  FOB diet.  You have a handout.  ACTIVITY:  You should plan to take it easy for the rest of today and you should NOT DRIVE or use heavy machinery until tomorrow (because of the sedation medicines used during the test).    FOLLOW UP: Our staff will call the number listed on your records the next business day following your procedure.  We will call around 7:15- 8:00 am to check on you and address any questions or concerns that you may have regarding the information given to you following your procedure. If we do not reach you, we will leave a message.     If any biopsies were taken you will be contacted by phone or by letter within the next 1-3 weeks.  Please call us at [phone] if you have not heard about the biopsies in 3 weeks.    SIGNATURES/CONFIDENTIALITY: You and/or your care partner have signed paperwork which will be entered into your electronic medical record.  These signatures attest to the fact that that the information above on your After Visit Summary has been reviewed and is understood.  Full responsibility of the confidentiality of this discharge information lies with you and/or your care-partner.

## 2023-02-10 NOTE — Progress Notes (Signed)
GASTROENTEROLOGY PROCEDURE H&P NOTE   Primary Care Physician: Wynn Banker, MD (Inactive)    Reason for Procedure:  Colon Cancer screening, colon polyp surveillance  Plan:    Colonoscopy  Patient is appropriate for endoscopic procedure(s) in the ambulatory ([facility]) setting.  The nature of the procedure, as well as the risks, benefits, and alternatives were carefully and thoroughly reviewed with the patient. Ample time for discussion and questions allowed. The patient understood, was satisfied, and agreed to proceed.     HPI: [name] is a 77 y.o. male who presents for colonoscopy for ongoing colon polyp surveillance.  No active GI symptoms.    Last colonoscopy was 12/2019 and notable for 6 subcentimeter adenomas scattered through the descending colon to the cecum, along with benign hyperplastic polyps in the rectum and sigmoid colon, pandiverticulosis, small internal hemorrhoids, with recommendation to repeat in 3 years.    Past Medical History:  Diagnosis Date   ALLERGIC RHINITIS CAUSE UNSPECIFIED 05/27/2010   Arthritis    BACK PAIN, UPPER 02/21/2010   Blood transfusion without reported diagnosis    with knee replacement   Cervicalgia 05/27/2010   CHEST PAIN UNSPECIFIED 04/25/2009   DIVERTICULOSIS, COLON 03/09/2007   DIZZINESS, CHRONIC 03/09/2007   GERD 12/24/2006   HEADACHE, SINUS 04/06/2009   History of colon polyps    HYPERLIPIDEMIA 12/24/2006   diet controlled   HYPERTENSION 12/24/2006   better after weight loss   INSOMNIA WITH SLEEP APNEA UNSPECIFIED 05/27/2010   Leukocytosis 12/08/2019   Motion sickness    Neuromuscular disorder (HCC)    tremor   Neuropathy    Open wound of wrist, complicated 10/13/2007   OSTEOARTHRITIS 12/24/2006   Other constipation 11/06/2009   Pneumonia    PONV (postoperative nausea and vomiting)    PROSTATITIS, RECURRENT 12/30/2007   SHOULDER PAIN, BILATERAL 07/16/2007   Sleep apnea    Squamous cell carcinoma, ear, left    TRANSIENT  ISCHEMIC ATTACKS, HX OF 04/06/2009    Past Surgical History:  Procedure Laterality Date   ANKLE ARTHROSCOPY W/ OPEN REPAIR  2002   Repair   COLONOSCOPY     COLONOSCOPY     around 2017. [place]    CYST EXCISION     from back 2015. likely this was meaning of ear cyst   EAR CYST EXCISION Bilateral 01/18/2013   Procedure: EXCISION CYSTS FROM BACK (TWO);  Surgeon: Velora Heckler, MD;  Location: [facility];  Service: General;  Laterality: Bilateral;   epidydmal cyst excision     ESOPHAGOGASTRODUODENOSCOPY     [place] around 2017   REPLACEMENT TOTAL KNEE BILATERAL  2005/2007   SKIN CANCER EXCISION  2020   ear, squamous cell   TONSILLECTOMY      Prior to Admission medications   Medication Sig Start Date End Date Taking? Authorizing Provider  amitriptyline (ELAVIL) 50 MG tablet Take 1 tablet (50 mg total) by mouth at bedtime. Patient taking differently: Take 75 mg by mouth at bedtime. 06/07/18  Yes Koberlein, Paris Lore, MD  Ascorbic Acid (VITAMIN C) 100 MG CHEW Chew 1 tablet by mouth daily.   Yes [provider]  aspirin EC 81 MG EC tablet Take 1 tablet (81 mg total) by mouth daily. 03/06/14  Yes Lonia Blood, MD  atorvastatin (LIPITOR) 80 MG tablet Take 80 mg by mouth daily.   Yes [provider]  Cyanocobalamin (VITAMIN B-12) 1000 MCG SUBL PLACE 1 TABLET (1,000 MCG TOTAL) UNDER THE TONGUE DAILY.  01/25/19  Yes Koberlein, Junell C, MD  lisinopril-hydrochlorothiazide (ZESTORETIC) 10-12.5 MG tablet TAKE 1 TABLET(S) EVERY DAY BY ORAL ROUTE IN THE MORNING FOR 90 DAYS. 10/26/17  Yes [provider]  pantoprazole (PROTONIX) 40 MG tablet TAKE 1 TABLET BY MOUTH EVERY DAY 10/22/18  Yes Koberlein, Junell C, MD  acetaminophen (TYLENOL) 500 MG tablet Take 1 tablet (500 mg total) by mouth every 6 (six) hours as needed. 01/23/23   Derwood Kaplan, MD  fluticasone (FLONASE) 50 MCG/ACT nasal spray Place 2 sprays into both nostrils daily. Patient not taking: Reported  on 01/15/2023 05/07/18   Wynn Banker, MD  gabapentin (NEURONTIN) 300 MG capsule Take by mouth. 01/06/23   [provider]  ibuprofen (ADVIL) 400 MG tablet Take 1.5 tablets (600 mg total) by mouth 3 (three) times daily. 01/23/23   Derwood Kaplan, MD  Lactobacillus Acid-Pectin (ACIDOPHILUS/PECTIN) CAPS Take 1 capsule every day by oral route in the morning for 30 days. Patient not taking: Reported on 02/10/2023 11/03/16   [provider]  meclizine (ANTIVERT) 12.5 MG tablet Take 1-2 tablets (12.5-25 mg total) by mouth every 6 (six) hours as needed for dizziness. Patient not taking: Reported on 01/15/2023 05/07/18   Wynn Banker, MD  niacinamide 500 MG tablet Take 500 mg by mouth daily.    [provider]  oxyCODONE-acetaminophen (PERCOCET/ROXICET) 5-325 MG tablet Take 1 tablet by mouth every 12 (twelve) hours as needed for severe pain. 01/23/23   Derwood Kaplan, MD  promethazine (PHENERGAN) 25 MG tablet Take 2 tablets every 8 hours by oral route as needed for 90 days.    [provider]  tiZANidine (ZANAFLEX) 4 MG capsule Take 1 capsule (4 mg total) by mouth 2 (two) times daily as needed for muscle spasms. 01/23/23   Derwood Kaplan, MD    Current Outpatient Medications  Medication Sig Dispense Refill   amitriptyline (ELAVIL) 50 MG tablet Take 1 tablet (50 mg total) by mouth at bedtime. (Patient taking differently: Take 75 mg by mouth at bedtime.) 90 tablet 1   Ascorbic Acid (VITAMIN C) 100 MG CHEW Chew 1 tablet by mouth daily.     aspirin EC 81 MG EC tablet Take 1 tablet (81 mg total) by mouth daily.     atorvastatin (LIPITOR) 80 MG tablet Take 80 mg by mouth daily.     Cyanocobalamin (VITAMIN B-12) 1000 MCG SUBL PLACE 1 TABLET (1,000 MCG TOTAL) UNDER THE TONGUE DAILY. 90 tablet 0   lisinopril-hydrochlorothiazide (ZESTORETIC) 10-12.5 MG tablet TAKE 1 TABLET(S) EVERY DAY BY ORAL ROUTE IN THE MORNING FOR 90 DAYS.     pantoprazole (PROTONIX) 40 MG tablet TAKE  1 TABLET BY MOUTH EVERY DAY 90 tablet 1   acetaminophen (TYLENOL) 500 MG tablet Take 1 tablet (500 mg total) by mouth every 6 (six) hours as needed. 30 tablet 0   fluticasone (FLONASE) 50 MCG/ACT nasal spray Place 2 sprays into both nostrils daily. (Patient not taking: Reported on 01/15/2023) 16 g 6   gabapentin (NEURONTIN) 300 MG capsule Take by mouth.     ibuprofen (ADVIL) 400 MG tablet Take 1.5 tablets (600 mg total) by mouth 3 (three) times daily. 30 tablet 0   Lactobacillus Acid-Pectin (ACIDOPHILUS/PECTIN) CAPS Take 1 capsule every day by oral route in the morning for 30 days. (Patient not taking: Reported on 02/10/2023)     meclizine (ANTIVERT) 12.5 MG tablet Take 1-2 tablets (12.5-25 mg total) by mouth every 6 (six) hours as needed for dizziness. (Patient not taking:  Reported on 01/15/2023) 30 tablet 1   niacinamide 500 MG tablet Take 500 mg by mouth daily.     oxyCODONE-acetaminophen (PERCOCET/ROXICET) 5-325 MG tablet Take 1 tablet by mouth every 12 (twelve) hours as needed for severe pain. 8 tablet 0   promethazine (PHENERGAN) 25 MG tablet Take 2 tablets every 8 hours by oral route as needed for 90 days.     tiZANidine (ZANAFLEX) 4 MG capsule Take 1 capsule (4 mg total) by mouth 2 (two) times daily as needed for muscle spasms. 20 capsule 0   Current Facility-Administered Medications  Medication Dose Route Frequency Provider Last Rate Last Admin   0.9 %  sodium chloride infusion  500 mL Intravenous Once Audelia Knape V, DO        Allergies as of 02/10/2023 - Review Complete 02/10/2023  Allergen Reaction Noted   Codeine Nausea Only and Other (See Comments) 07/27/2018    Family History  Problem Relation Age of Onset   Kidney failure Mother 36   Breast cancer Mother    Achalasia Father    Depression Father    Heart disease Father    Diabetes Sister    Heart disease Brother 29       heavy smoker-died of MI   Colon cancer Neg Hx    Esophageal cancer Neg Hx    Colon polyps Neg Hx     Stomach cancer Neg Hx    Rectal cancer Neg Hx     Social History   Socioeconomic History   Marital status: Married    Spouse name: Elease Hashimoto   Number of children: 1   Years of education: 12   Highest education level: Not on file  Occupational History   Occupation: Retired    Associate Professor: RETIRED    Comment: [employer], Merchandiser, retail (1996)  Tobacco Use   Smoking status: Never   Smokeless tobacco: Never   Tobacco comments:    second hand smoke  Vaping Use   Vaping status: Never Used  Substance and Sexual Activity   Alcohol use: Not Currently    Comment: quit 45 yrs ago   Drug use: No   Sexual activity: Yes  Other Topics Concern   Not on file  Social History Narrative   Married 1967. 1 girl. 2 grandkids.       Retired Lawyer      Hobbies: Armed forces training and education officer, lake, boat   Caffeine 1 daily         Social Determinants of Health   Financial Resource Strain: Low Risk  (08/20/2020)   Overall Financial Resource Strain (CARDIA)    Difficulty of Paying Living Expenses: Not hard at all  Food Insecurity: No Food Insecurity (08/20/2020)   Hunger Vital Sign    Worried About Running Out of Food in the Last Year: Never true    Ran Out of Food in the Last Year: Never true  Transportation Needs: No Transportation Needs (08/20/2020)   PRAPARE - Administrator, Civil Service (Medical): No    Lack of Transportation (Non-Medical): No  Physical Activity: Inactive (08/20/2020)   Exercise Vital Sign    Days of Exercise per Week: 0 days    Minutes of Exercise per Session: 0 min  Stress: No Stress Concern Present (08/20/2020)   [employer] of Occupational Health - Occupational Stress Questionnaire    Feeling of Stress : Not at all  Social Connections: Moderately Integrated (08/20/2020)   Social Connection and Isolation Panel [NHANES]  Frequency of Communication with Friends and Family: More than three times a week    Frequency of Social Gatherings with Friends and  Family: Once a week    Attends Religious Services: More than 4 times per year    Active Member of [employer] or Organizations: No    Attends Banker Meetings: Never    Marital Status: Married  Catering manager Violence: Not At Risk (08/20/2020)   Humiliation, Afraid, Rape, and Kick questionnaire    Fear of Current or Ex-Partner: No    Emotionally Abused: No    Physically Abused: No    Sexually Abused: No    Physical Exam: Vital signs in last 24 hours: @BP  126/69   Pulse 90   Temp 97.8 F (36.6 C)   Ht 5\' 8"  (1.727 m)   Wt 190 lb (86.2 kg)   SpO2 97%   BMI 28.89 kg/m  GEN: NAD EYE: Sclerae anicteric ENT: MMM CV: Non-tachycardic Pulm: CTA b/l GI: Soft, NT/ND NEURO:  Alert & Oriented x 3   Doristine Locks, DO [facility]   02/10/2023 11:10 AM

## 2023-02-11 ENCOUNTER — Telehealth: Payer: Self-pay

## 2023-02-11 ENCOUNTER — Encounter: Payer: Medicare Other | Admitting: Physical Therapy

## 2023-02-11 NOTE — Telephone Encounter (Signed)
Follow up call placed, no answer and no VM. 

## 2023-02-16 ENCOUNTER — Ambulatory Visit: Payer: Medicare Other | Admitting: *Deleted

## 2023-02-16 ENCOUNTER — Encounter: Payer: Self-pay | Admitting: *Deleted

## 2023-02-16 DIAGNOSIS — R293 Abnormal posture: Secondary | ICD-10-CM

## 2023-02-16 DIAGNOSIS — M62830 Muscle spasm of back: Secondary | ICD-10-CM

## 2023-02-16 DIAGNOSIS — M5459 Other low back pain: Secondary | ICD-10-CM | POA: Diagnosis not present

## 2023-02-16 NOTE — Therapy (Signed)
OUTPATIENT PHYSICAL THERAPY THORACOLUMBAR TREATMENT   Patient Name: [name] MRN: [id number] DOB:1945/10/12, 77 y.o., male Today's Date: 02/16/2023  END OF SESSION:  PT End of Session - 02/16/23 1107     Visit Number 11    Number of Visits 12    Date for PT Re-Evaluation 02/19/23    PT Start Time 1100    PT Stop Time 1151    PT Time Calculation (min) 51 min            Past Medical History:  Diagnosis Date   ALLERGIC RHINITIS CAUSE UNSPECIFIED 05/27/2010   Arthritis    BACK PAIN, UPPER 02/21/2010   Blood transfusion without reported diagnosis    with knee replacement   Cervicalgia 05/27/2010   CHEST PAIN UNSPECIFIED 04/25/2009   DIVERTICULOSIS, COLON 03/09/2007   DIZZINESS, CHRONIC 03/09/2007   GERD 12/24/2006   HEADACHE, SINUS 04/06/2009   History of colon polyps    HYPERLIPIDEMIA 12/24/2006   diet controlled   HYPERTENSION 12/24/2006   better after weight loss   INSOMNIA WITH SLEEP APNEA UNSPECIFIED 05/27/2010   Leukocytosis 12/08/2019   Motion sickness    Neuromuscular disorder (HCC)    tremor   Neuropathy    Open wound of wrist, complicated 10/13/2007   OSTEOARTHRITIS 12/24/2006   Other constipation 11/06/2009   Pneumonia    PONV (postoperative nausea and vomiting)    PROSTATITIS, RECURRENT 12/30/2007   SHOULDER PAIN, BILATERAL 07/16/2007   Sleep apnea    Squamous cell carcinoma, ear, left    TRANSIENT ISCHEMIC ATTACKS, HX OF 04/06/2009   Past Surgical History:  Procedure Laterality Date   ANKLE ARTHROSCOPY W/ OPEN REPAIR  2002   Repair   COLONOSCOPY     COLONOSCOPY     around 2017. [place]    CYST EXCISION     from back 2015. 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   Tremor 04/10/2014   Elevated hemoglobin A1c 04/10/2014   Solitary pulmonary nodule 04/10/2014   Constipation, slow transit 08/14/2010   Allergic rhinitis 05/27/2010   Cervicalgia 05/27/2010   Insomnia with sleep apnea 05/27/2010   HEADACHE, SINUS 04/06/2009   SHOULDER PAIN, BILATERAL 07/16/2007   DIZZINESS, CHRONIC 03/09/2007   Hyperlipidemia 12/24/2006   Essential hypertension 12/24/2006   GERD 12/24/2006   Osteoarthritis 12/24/2006   REFERRING PROVIDER: Cameron Sprang PA-C  REFERRING DIAG: Lumbar radiculopathy  Rationale for Evaluation and Treatment: Rehabilitation  THERAPY DIAG:  Other low back pain  Muscle spasm of back  Abnormal posture  ONSET DATE: ~6 weeks ago.  SUBJECTIVE:  SUBJECTIVE STATEMENT: Hurting still in LB and Glute and no pain from knee to foot anymore. Had MD f/u and MRI results  PERTINENT HISTORY:  LBP in past.  Bilateral TKR's.  PAIN:  Are you having pain? Yes: NPRS scale: 8/10 Pain location: Right LB/hip and lateral thigh. Pain description: Burning and sharp. Aggravating factors: As above. Relieving factors: As above.  PRECAUTIONS: None  RED FLAGS: No.  WEIGHT BEARING RESTRICTIONS: No  PATIENT GOALS: Not have pain.  OBJECTIVE:   POSTURE: rounded shoulders, forward head, decreased lumbar lordosis, and flexed trunk , scoliosis.  PALPATION: Tender to palpation over right SIJ and proximal gluteal musculature.  LUMBAR ROM:  Active lumbar flexion limited by 25% and active extension to 10 degrees.  LOWER EXTREMITY MMT:   Normal LE DTR's.  LUMBAR  SPECIAL TESTS:  Significant leg length discrepancy with right shorter than left.   Normal bilateral Patellar reflexes, normal left Achilles, unable to elicit right Achilles.  GAIT: The patient walking in a flexed trunk posture in obvious pain.  TODAY'S TREATMENT:                                                                                                                              DATE: 02/16/23  Discussed AB bracing  with transitional movements  as well as movement patterns with ADL's to decrease pain triggers                                    EXERCISE LOG  Exercise Repetitions and Resistance Comments  AB bracing  X 10   Dying bug X6 hold 10 secs each side   Bridge X6  hold 5 secs   Standing modified bird-dog  Arms x 6 hold 10, LE x 6 hold 10 secs each side        Blank cell = exercise not performed today  Modalities  Date: 02/16/23 Traction: lumbar,  ,   mins mins Combo: Lumbar,  ,   mins, Pain  Manual Therapy Soft Tissue Mobilization: R lumbar paraspinals/QL/ superior glute, reduce tone and pain    PATIENT EDUCATION:  Education details: Discussed sleeping with pillow between knees to keep hips in alignment. Person educated: Patient Education method: Medical illustrator Education comprehension: verbalized understanding and returned demonstration  HOME EXERCISE PROGRAM:  ASSESSMENT:  CLINICAL IMPRESSION: Patient arrived today reporting LBP and RT glute pain. He reports no better from traction and wanted to focus on body mechanics and exs. We discussed AB bracing  with transitional movements  as well as movement patterns with ADL's to decrease pain triggers. Core activation and core strengthening therex performed and handout given    OBJECTIVE IMPAIRMENTS: Abnormal gait, decreased activity tolerance, difficulty walking, decreased ROM, increased muscle spasms, postural dysfunction, and pain.   ACTIVITY LIMITATIONS: carrying, lifting, bending, standing,  sleeping, dressing, and locomotion level  PARTICIPATION LIMITATIONS: meal prep,  cleaning, laundry, community activity, and yard work  PERSONAL FACTORS: 1 comorbidity: LBP in past  are also affecting patient's functional outcome.   REHAB POTENTIAL: Good  CLINICAL DECISION MAKING: Stable/uncomplicated  EVALUATION COMPLEXITY: Low  GOALS:  SHORT TERM GOALS: Target date: 01/22/23  Ind with an initial HEP. Goal status: INITIAL   LONG TERM GOALS: Target date: 02/19/23.  Ind with an advanced HEP.  Goal status: INITIAL  2.  Walk a community distance with pain not > 3/10.  Goal status: INITIAL  3.  Eliminate radiation of pain/symptoms into right hip and lateral thigh.  Goal status: INITIAL  4.  Stand 20 minutes with pain not > 3/10.  Goal status: INITIAL  5.  Perform ADL's with pain not > 3-4/10.  Goal status: INITIAL  PLAN:  PT FREQUENCY: 2x/week  PT DURATION: 6 weeks  PLANNED INTERVENTIONS: Therapeutic exercises, Therapeutic activity, Neuromuscular re-education, Patient/Family education, Self Care, Joint mobilization, Dry Needling, Electrical stimulation, Spinal mobilization, Cryotherapy, Moist heat, Traction, Ultrasound, and Manual therapy.  PLAN FOR NEXT SESSION: Combo e'stim/US, STW/M, core exercise progression, spinal protection techniques and body mechanics training.    Shaylan Tutton,CHRIS, PTA 02/16/2023, 1:38 PM   Progress Note Reporting Period 01/08/23 to 02/09/23

## 2023-02-18 ENCOUNTER — Encounter: Payer: Medicare Other | Admitting: Physical Therapy

## 2023-02-26 ENCOUNTER — Ambulatory Visit: Payer: Medicare Other | Admitting: Physical Therapy

## 2023-02-26 ENCOUNTER — Encounter: Payer: Self-pay | Admitting: Physical Therapy

## 2023-02-26 DIAGNOSIS — M5459 Other low back pain: Secondary | ICD-10-CM

## 2023-02-26 DIAGNOSIS — M62830 Muscle spasm of back: Secondary | ICD-10-CM

## 2023-02-26 DIAGNOSIS — R293 Abnormal posture: Secondary | ICD-10-CM

## 2023-02-26 NOTE — Therapy (Signed)
OUTPATIENT PHYSICAL THERAPY THORACOLUMBAR TREATMENT   Patient Name: [name] MRN: [id number] DOB:Sep 18, 1945, 77 y.o., male Today's Date: 02/26/2023  END OF SESSION:  PT End of Session - 02/26/23 0926     Visit Number 12    Number of Visits 12    Date for PT Re-Evaluation 02/19/23    PT Start Time 0935    PT Stop Time 1020    PT Time Calculation (min) 45 min    Activity Tolerance Patient tolerated treatment well    Behavior During Therapy [facility] for tasks assessed/performed            Past Medical History:  Diagnosis Date   ALLERGIC RHINITIS CAUSE UNSPECIFIED 05/27/2010   Arthritis    BACK PAIN, UPPER 02/21/2010   Blood transfusion without reported diagnosis    with knee replacement   Cervicalgia 05/27/2010   CHEST PAIN UNSPECIFIED 04/25/2009   DIVERTICULOSIS, COLON 03/09/2007   DIZZINESS, CHRONIC 03/09/2007   GERD 12/24/2006   HEADACHE, SINUS 04/06/2009   History of colon polyps    HYPERLIPIDEMIA 12/24/2006   diet controlled   HYPERTENSION 12/24/2006   better after weight loss   INSOMNIA WITH SLEEP APNEA UNSPECIFIED 05/27/2010   Leukocytosis 12/08/2019   Motion sickness    Neuromuscular disorder (HCC)    tremor   Neuropathy    Open wound of wrist, complicated 10/13/2007   OSTEOARTHRITIS 12/24/2006   Other constipation 11/06/2009   Pneumonia    PONV (postoperative nausea and vomiting)    PROSTATITIS, RECURRENT 12/30/2007   SHOULDER PAIN, BILATERAL 07/16/2007   Sleep apnea    Squamous cell carcinoma, ear, left    TRANSIENT ISCHEMIC ATTACKS, HX OF 04/06/2009   Past Surgical History:  Procedure Laterality Date   ANKLE ARTHROSCOPY W/ OPEN REPAIR  2002   Repair   COLONOSCOPY     COLONOSCOPY     around 2017. [place]    CYST EXCISION     from back 2015. 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   Tremor 04/10/2014   Elevated hemoglobin A1c 04/10/2014   Solitary pulmonary nodule 04/10/2014   Constipation, slow transit 08/14/2010   Allergic rhinitis 05/27/2010   Cervicalgia 05/27/2010   Insomnia with sleep apnea 05/27/2010   HEADACHE, SINUS 04/06/2009   SHOULDER PAIN, BILATERAL 07/16/2007   DIZZINESS, CHRONIC 03/09/2007   Hyperlipidemia 12/24/2006   Essential hypertension 12/24/2006   GERD 12/24/2006   Osteoarthritis 12/24/2006   REFERRING PROVIDER: Cameron Sprang PA-C  REFERRING DIAG: Lumbar radiculopathy  Rationale for Evaluation and Treatment: Rehabilitation  THERAPY DIAG:  Other low back pain  Muscle spasm of back  Abnormal posture  ONSET DATE: ~6 weeks ago.  SUBJECTIVE:  SUBJECTIVE STATEMENT: Hurting still in LB and Glute and no pain from knee to foot anymore. Had MD f/u and MRI results  PERTINENT HISTORY:  LBP in past.  Bilateral TKR's.  PAIN:  Are you having pain? Yes: NPRS scale: 8/10 Pain location: Right LB/hip and lateral thigh. Pain description: Burning and sharp. Aggravating factors: As above. Relieving factors: As above.  PRECAUTIONS: None  RED FLAGS: No.  WEIGHT BEARING RESTRICTIONS: No  PATIENT GOALS: Not have pain.  OBJECTIVE:   POSTURE: rounded shoulders, forward head, decreased lumbar lordosis, and flexed trunk , scoliosis.  PALPATION: Tender to palpation over right SIJ and proximal gluteal musculature.  LUMBAR ROM:  Active  lumbar flexion limited by 25% and active extension to 10 degrees.  LOWER EXTREMITY MMT:   Normal LE DTR's.  LUMBAR SPECIAL TESTS:  Significant leg length discrepancy with right shorter than left.   Normal bilateral Patellar reflexes, normal left Achilles, unable to elicit right Achilles.  GAIT: The patient walking in a flexed trunk posture in obvious pain.  TODAY'S TREATMENT:                                                                                                                              DATE: 02/26/23  Modalities  Date: 02/26/23 Unattended Estim: Lumbar, IFC, 15 mins, Pain and Tone Combo: Lumbar, 1.5 w/cm2, 100%, 1 mhz, 10 mins, Pain and Tone  Manual Therapy Soft Tissue Mobilization: B lumbar paraspinals/QL, reduce tone and pain    PATIENT EDUCATION:  Education details: Discussed sleeping with pillow between knees to keep hips in alignment. Person educated: Patient Education method: Medical illustrator Education comprehension: verbalized understanding and returned demonstration  HOME EXERCISE PROGRAM:  ASSESSMENT:  CLINICAL IMPRESSION: Patient presented in clinic with reports of continued high level pain regardless of any efforts. Patient reports that she also feels weak here recently but has also been busy with other appointments. Tone still palpable throughout lumbar musculature. Patient still limited with transitions and transfers with pain and standing is also very uncomfortable. Normal modalities response noted following removal of the modalities. No goals met due to pain.  OBJECTIVE IMPAIRMENTS: Abnormal gait, decreased activity tolerance, difficulty walking, decreased ROM, increased muscle spasms, postural dysfunction, and pain.   ACTIVITY LIMITATIONS: carrying, lifting, bending, standing, sleeping, dressing, and locomotion level  PARTICIPATION LIMITATIONS: meal prep, cleaning, laundry, community activity, and yard work  PERSONAL FACTORS: 1  comorbidity: LBP in past  are also affecting patient's functional outcome.   REHAB POTENTIAL: Good  CLINICAL DECISION MAKING: Stable/uncomplicated  EVALUATION COMPLEXITY: Low  GOALS:  SHORT TERM GOALS: Target date: 01/22/23  Ind with an initial HEP. Goal status: UNABLE TO ASSESS   LONG TERM GOALS: Target date: 02/19/23.  Ind with an advanced HEP.  Goal status: UNABLE TO ASSESS  2.  Walk a community distance with pain not > 3/10.  Goal status: NOT MET  3.  Eliminate radiation of pain/symptoms into right hip and lateral  thigh.  Goal status: NOT MET  4.  Stand 20 minutes with pain not > 3/10.  Goal status: NOT MET  5.  Perform ADL's with pain not > 3-4/10.  Goal status: NOT MET  PLAN:  PT FREQUENCY: 2x/week  PT DURATION: 6 weeks  PLANNED INTERVENTIONS: Therapeutic exercises, Therapeutic activity, Neuromuscular re-education, Patient/Family education, Self Care, Joint mobilization, Dry Needling, Electrical stimulation, Spinal mobilization, Cryotherapy, Moist heat, Traction, Ultrasound, and Manual therapy.  PLAN FOR NEXT SESSION: DC  Marvell Fuller, PTA 02/26/2023, 10:22 AM

## 2023-06-02 ENCOUNTER — Ambulatory Visit: Payer: Medicare Other | Admitting: Physical Therapy

## 2023-06-03 ENCOUNTER — Ambulatory Visit: Payer: Medicare Other | Attending: Student | Admitting: Physical Therapy

## 2023-06-03 DIAGNOSIS — M5459 Other low back pain: Secondary | ICD-10-CM | POA: Insufficient documentation

## 2023-06-03 DIAGNOSIS — M62830 Muscle spasm of back: Secondary | ICD-10-CM | POA: Diagnosis present

## 2023-06-03 DIAGNOSIS — R293 Abnormal posture: Secondary | ICD-10-CM | POA: Insufficient documentation

## 2023-06-03 NOTE — Therapy (Signed)
OUTPATIENT PHYSICAL THERAPY THORACOLUMBAR EVALUATION   Patient Name: [name] MRN: [id number] DOB:1946-05-16, 77 y.o., male Today's Date: 06/03/2023  END OF SESSION:  PT End of Session - 06/03/23 1142     Visit Number 1    Number of Visits 6    Date for PT Re-Evaluation 07/01/23    PT Start Time 1017    PT Stop Time 1047    PT Time Calculation (min) 30 min    Activity Tolerance Patient tolerated treatment well    Behavior During Therapy [facility] for tasks assessed/performed             Past Medical History:  Diagnosis Date   ALLERGIC RHINITIS CAUSE UNSPECIFIED 05/27/2010   Arthritis    BACK PAIN, UPPER 02/21/2010   Blood transfusion without reported diagnosis    with knee replacement   Cervicalgia 05/27/2010   CHEST PAIN UNSPECIFIED 04/25/2009   DIVERTICULOSIS, COLON 03/09/2007   DIZZINESS, CHRONIC 03/09/2007   GERD 12/24/2006   HEADACHE, SINUS 04/06/2009   History of colon polyps    HYPERLIPIDEMIA 12/24/2006   diet controlled   HYPERTENSION 12/24/2006   better after weight loss   INSOMNIA WITH SLEEP APNEA UNSPECIFIED 05/27/2010   Leukocytosis 12/08/2019   Motion sickness    Neuromuscular disorder (HCC)    tremor   Neuropathy    Open wound of wrist, complicated 10/13/2007   OSTEOARTHRITIS 12/24/2006   Other constipation 11/06/2009   Pneumonia    PONV (postoperative nausea and vomiting)    PROSTATITIS, RECURRENT 12/30/2007   SHOULDER PAIN, BILATERAL 07/16/2007   Sleep apnea    Squamous cell carcinoma, ear, left    TRANSIENT ISCHEMIC ATTACKS, HX OF 04/06/2009   Past Surgical History:  Procedure Laterality Date   ANKLE ARTHROSCOPY W/ OPEN REPAIR  2002   Repair   COLONOSCOPY     COLONOSCOPY     around 2017. [place]    CYST EXCISION     from back 2015. 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   Tremor 04/10/2014   Elevated hemoglobin A1c 04/10/2014   Solitary pulmonary nodule 04/10/2014   Constipation, slow transit 08/14/2010   Allergic rhinitis 05/27/2010   Cervicalgia 05/27/2010   Insomnia with sleep apnea 05/27/2010   HEADACHE, SINUS 04/06/2009   SHOULDER PAIN, BILATERAL 07/16/2007   DIZZINESS, CHRONIC 03/09/2007   Hyperlipidemia 12/24/2006   Essential hypertension 12/24/2006   GERD 12/24/2006   Osteoarthritis 12/24/2006    REFERRING PROVIDER: Macon Large Meyran  REFERRING DIAG: Other interverbal disc displacement  Rationale for Evaluation and Treatment: Rehabilitation  THERAPY DIAG:  Other low back pain - Plan: PT plan of care cert/re-cert  ONSET DATE: Ongoing.  SUBJECTIVE:  SUBJECTIVE STATEMENT: The patient presents to the clinic s/p lumbar surgery on 04/10/23.  He states he feels 70-80% better overall.  He has some remaining low back stiffness and some pain into his right buttock.  His pain is a low 2-3/10 today.  Increased activity can increase his pain some.  Rest decreases pain.    PERTINENT HISTORY:  See above.  PAIN:  Are you having pain? Yes: NPRS scale: 2-3/10 Pain location: LB, right buttock. Pain description: Ache. Aggravating factors: As above. Relieving factors: As above.  PRECAUTIONS: None  RED FLAGS: None   WEIGHT BEARING RESTRICTIONS: No  FALLS:  Has patient fallen in last 6 months? No  LIVING ENVIRONMENT: Lives with: lives with  their spouse Lives in: House/apartment Has following equipment at home: None  OCCUPATION: Retired.  PLOF: Independent  PATIENT GOALS: Learn exercises and body mechanics.    OBJECTIVE:  Note: Objective measures were completed at Evaluation unless otherwise noted.  PATIENT SURVEYS:  FOTO 45.51.   POSTURE: rounded shoulders, forward head, and decreased lumbar lordosis  PALPATION: Mild tenderness in lumbar incisional area and some pain radiation into right buttock.  LUMBAR ROM:   Active lumbar flexion limited by 25% and extension to 15 degrees.   LOWER EXTREMITY MMT:    Normal LE strength. GAIT: Walking in slight flexed trunk posture with essentially normal gait cycle.     PATIENT EDUCATION:  Education details: Discussed exercise progression and body mechanics training. Person educated: Patient Education method: Explanation Education comprehension: verbalized understanding  HOME EXERCISE PROGRAM:   ASSESSMENT:  CLINICAL IMPRESSION: The patient presents to OPPT s/p lumbar surgery performed on 04/10/23.  He is very pleased with his outcome.  He has some remaining mild lumbar tenderness in the region of the incisional site and some pain radiation into his right buttock.  He has normal LE strength.  His gait is essentially normal though he walks in slight trunk flexion.  He would like to have instruction in core exercises and body mechanics training.   Patient will benefit from skilled physical therapy intervention to address pain and deficits.  OBJECTIVE IMPAIRMENTS: decreased activity tolerance, increased muscle spasms, and pain.   ACTIVITY LIMITATIONS: carrying, lifting, bending, and standing  PARTICIPATION LIMITATIONS: laundry, community activity, and yard work  PERSONAL FACTORS: Time since onset of injury/illness/exacerbation and 1 comorbidity: prior lumbar surgery  are also affecting patient's functional outcome.   REHAB POTENTIAL: Excellent  CLINICAL  DECISION MAKING: Stable/uncomplicated  EVALUATION COMPLEXITY: Low   GOALS:  SHORT TERM GOALS: Target date: 07/01/23.  Ind with a HEP. Goal status: INITIAL  2.  Perform ADL's with pain not > 2-3/10.  Goal status: INITIAL  3.  Walk a community distance with pain not > 2-3/10.  Goal status: INITIAL PLAN:  PT FREQUENCY:  6 visits.  PT DURATION: other: 3-4 weeks.  PLANNED INTERVENTIONS: 97110-Therapeutic exercises, 97530- Therapeutic activity, [id number]- Neuromuscular re-education, 97535- Self Care, 16109- Manual therapy, 97014- Electrical stimulation (unattended), [id number]- Ultrasound, Patient/Family education, Cryotherapy, and Moist heat.  PLAN FOR NEXT SESSION: Core exercise progression, spinal protection techniques and body mechanics training   Cathern Tahir, [place], PT 06/03/2023, 11:45 AM

## 2023-06-08 ENCOUNTER — Ambulatory Visit: Payer: Medicare Other | Admitting: *Deleted

## 2023-06-08 DIAGNOSIS — R293 Abnormal posture: Secondary | ICD-10-CM

## 2023-06-08 DIAGNOSIS — M5459 Other low back pain: Secondary | ICD-10-CM | POA: Diagnosis not present

## 2023-06-08 DIAGNOSIS — M62830 Muscle spasm of back: Secondary | ICD-10-CM

## 2023-06-08 NOTE — Therapy (Signed)
OUTPATIENT PHYSICAL THERAPY THORACOLUMBAR TREATMENT   Patient Name: [name] MRN: [id number] DOB:02-03-1946, 77 y.o., male Today's Date: 06/08/2023  END OF SESSION:  PT End of Session - 06/08/23 0933     Visit Number 2    Number of Visits 6    Date for PT Re-Evaluation 07/01/23    PT Start Time 0934    PT Stop Time 1020    PT Time Calculation (min) 46 min             Past Medical History:  Diagnosis Date   ALLERGIC RHINITIS CAUSE UNSPECIFIED 05/27/2010   Arthritis    BACK PAIN, UPPER 02/21/2010   Blood transfusion without reported diagnosis    with knee replacement   Cervicalgia 05/27/2010   CHEST PAIN UNSPECIFIED 04/25/2009   DIVERTICULOSIS, COLON 03/09/2007   DIZZINESS, CHRONIC 03/09/2007   GERD 12/24/2006   HEADACHE, SINUS 04/06/2009   History of colon polyps    HYPERLIPIDEMIA 12/24/2006   diet controlled   HYPERTENSION 12/24/2006   better after weight loss   INSOMNIA WITH SLEEP APNEA UNSPECIFIED 05/27/2010   Leukocytosis 12/08/2019   Motion sickness    Neuromuscular disorder (HCC)    tremor   Neuropathy    Open wound of wrist, complicated 10/13/2007   OSTEOARTHRITIS 12/24/2006   Other constipation 11/06/2009   Pneumonia    PONV (postoperative nausea and vomiting)    PROSTATITIS, RECURRENT 12/30/2007   SHOULDER PAIN, BILATERAL 07/16/2007   Sleep apnea    Squamous cell carcinoma, ear, left    TRANSIENT ISCHEMIC ATTACKS, HX OF 04/06/2009   Past Surgical History:  Procedure Laterality Date   ANKLE ARTHROSCOPY W/ OPEN REPAIR  2002   Repair   COLONOSCOPY     COLONOSCOPY     around 2017. [place]    CYST EXCISION     from back 2015. 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   Tremor 04/10/2014   Elevated hemoglobin A1c 04/10/2014   Solitary pulmonary nodule 04/10/2014   Constipation, slow transit 08/14/2010   Allergic rhinitis 05/27/2010   Cervicalgia 05/27/2010   Insomnia with sleep apnea 05/27/2010   HEADACHE, SINUS 04/06/2009   SHOULDER PAIN, BILATERAL 07/16/2007   DIZZINESS, CHRONIC 03/09/2007   Hyperlipidemia 12/24/2006   Essential hypertension 12/24/2006   GERD 12/24/2006   Osteoarthritis 12/24/2006    REFERRING PROVIDER: Macon Large Meyran  REFERRING DIAG: Other interverbal disc displacement  Rationale for Evaluation and Treatment: Rehabilitation  THERAPY DIAG:  Other low back pain  Muscle spasm of back  Abnormal posture  ONSET DATE: Ongoing.  SUBJECTIVE:  SUBJECTIVE STATEMENT: The patient presents to the clinic s/p lumbar surgery on 04/10/23.   4/10 LBP today   PERTINENT HISTORY:  See above.  PAIN:  Are you having pain? Yes: NPRS scale: 4/10 Pain location: LB, right buttock. Pain description: Ache. Aggravating factors: As above. Relieving factors: As above.  PRECAUTIONS: None  RED FLAGS: None   WEIGHT BEARING RESTRICTIONS: No  FALLS:  Has patient fallen in last 6 months? No  LIVING ENVIRONMENT: Lives with: lives with their spouse Lives in: House/apartment Has following equipment at home: None  OCCUPATION: Retired.  PLOF: Independent  PATIENT GOALS: Learn exercises and body mechanics.    OBJECTIVE:   Date:   06/08/23                                    EXERCISE LOG  8 weeks 06-05-23  Exercise Repetitions and Resistance  Comments  AB bracing X 10 hold hold 5 secs   Seated ball press down X10 hold 5 secs   Seated marching X 6 hold 10 sec holds   Standing bird dog X 6 hold 10secs UE and then LE each         Blank cell = exercise not performed today  Reviewed AB bracing with transitional mvmnts and movement patterns modified with ADL's to prevent pain triggers.    Note: Objective measures were completed at Evaluation unless otherwise noted.  PATIENT SURVEYS:  FOTO 45.51.   POSTURE: rounded shoulders, forward head, and decreased lumbar lordosis  PALPATION: Mild tenderness in lumbar incisional area and some pain radiation into right buttock.  LUMBAR ROM:   Active lumbar flexion limited by 25% and extension to 15 degrees.   LOWER EXTREMITY MMT:    Normal LE strength. GAIT: Walking in slight flexed trunk posture with essentially normal gait cycle.     PATIENT EDUCATION:  Education details: Discussed exercise progression and body mechanics training. Person educated: Patient Education method: Explanation Education comprehension: verbalized understanding  HOME EXERCISE PROGRAM:   ASSESSMENT:  CLINICAL IMPRESSION: The patient presents to OPPT s/p lumbar surgery performed on 04/10/23. Rx focused on  AB bracing with transitional mvmnts and movement patterns modified with ADL's to prevent pain triggers. Core activation exs were also performed in standing and sitting. Pt reports he is unable to perform exs in the floor at home. Handout given for HEP with reps and sets per day       OBJECTIVE IMPAIRMENTS: decreased activity tolerance, increased muscle spasms, and pain.   ACTIVITY LIMITATIONS: carrying, lifting, bending, and standing  PARTICIPATION LIMITATIONS: laundry, community activity, and yard work  PERSONAL FACTORS: Time since onset of injury/illness/exacerbation and 1 comorbidity: prior lumbar surgery  are also affecting patient's functional outcome.   REHAB POTENTIAL:  Excellent  CLINICAL DECISION MAKING: Stable/uncomplicated  EVALUATION COMPLEXITY: Low   GOALS:  SHORT TERM GOALS: Target date: 07/01/23.  Ind with a HEP. Goal status: INITIAL  2.  Perform ADL's with pain not > 2-3/10.  Goal status: INITIAL  3.  Walk a community distance with pain not > 2-3/10.  Goal status: INITIAL PLAN:  PT FREQUENCY:  6 visits.  PT DURATION: other: 3-4 weeks.  PLANNED INTERVENTIONS: 97110-Therapeutic exercises, 97530- Therapeutic activity, [id number]- Neuromuscular re-education, 97535- Self Care, 16109- Manual therapy, 97014- Electrical stimulation (unattended), [id number]- Ultrasound, Patient/Family education, Cryotherapy, and Moist heat.  PLAN FOR NEXT SESSION: Core exercise progression, spinal protection techniques and body mechanics training  Allan Minotti,CHRIS, PTA 06/08/2023, 1:08 PM

## 2023-06-11 ENCOUNTER — Encounter: Payer: Medicare Other | Admitting: Physical Therapy

## 2023-06-12 ENCOUNTER — Ambulatory Visit: Payer: Medicare Other | Admitting: Physical Therapy

## 2023-06-12 DIAGNOSIS — R293 Abnormal posture: Secondary | ICD-10-CM

## 2023-06-12 DIAGNOSIS — M5459 Other low back pain: Secondary | ICD-10-CM | POA: Diagnosis not present

## 2023-06-12 DIAGNOSIS — M62830 Muscle spasm of back: Secondary | ICD-10-CM

## 2023-06-12 NOTE — Therapy (Signed)
OUTPATIENT PHYSICAL THERAPY THORACOLUMBAR TREATMENT   Patient Name: [name] MRN: [id number] DOB:12-Jan-1946, 77 y.o., male Today's Date: 06/12/2023  END OF SESSION:  PT End of Session - 06/12/23 1052     Visit Number 3    Number of Visits 6    Date for PT Re-Evaluation 07/01/23    PT Start Time 0930    PT Stop Time 1019    PT Time Calculation (min) 49 min    Activity Tolerance Patient tolerated treatment well    Behavior During Therapy [facility] for tasks assessed/performed             Past Medical History:  Diagnosis Date   ALLERGIC RHINITIS CAUSE UNSPECIFIED 05/27/2010   Arthritis    BACK PAIN, UPPER 02/21/2010   Blood transfusion without reported diagnosis    with knee replacement   Cervicalgia 05/27/2010   CHEST PAIN UNSPECIFIED 04/25/2009   DIVERTICULOSIS, COLON 03/09/2007   DIZZINESS, CHRONIC 03/09/2007   GERD 12/24/2006   HEADACHE, SINUS 04/06/2009   History of colon polyps    HYPERLIPIDEMIA 12/24/2006   diet controlled   HYPERTENSION 12/24/2006   better after weight loss   INSOMNIA WITH SLEEP APNEA UNSPECIFIED 05/27/2010   Leukocytosis 12/08/2019   Motion sickness    Neuromuscular disorder (HCC)    tremor   Neuropathy    Open wound of wrist, complicated 10/13/2007   OSTEOARTHRITIS 12/24/2006   Other constipation 11/06/2009   Pneumonia    PONV (postoperative nausea and vomiting)    PROSTATITIS, RECURRENT 12/30/2007   SHOULDER PAIN, BILATERAL 07/16/2007   Sleep apnea    Squamous cell carcinoma, ear, left    TRANSIENT ISCHEMIC ATTACKS, HX OF 04/06/2009   Past Surgical History:  Procedure Laterality Date   ANKLE ARTHROSCOPY W/ OPEN REPAIR  2002   Repair   COLONOSCOPY     COLONOSCOPY     around 2017. [place]    CYST EXCISION     from back 2015. 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   Tremor 04/10/2014   Elevated hemoglobin A1c 04/10/2014   Solitary pulmonary nodule 04/10/2014   Constipation, slow transit 08/14/2010   Allergic rhinitis 05/27/2010   Cervicalgia 05/27/2010   Insomnia with sleep apnea 05/27/2010   HEADACHE, SINUS 04/06/2009   SHOULDER PAIN, BILATERAL 07/16/2007   DIZZINESS, CHRONIC 03/09/2007   Hyperlipidemia 12/24/2006   Essential hypertension 12/24/2006   GERD 12/24/2006   Osteoarthritis 12/24/2006    REFERRING PROVIDER: Macon Large Meyran  REFERRING DIAG: Other interverbal disc displacement  Rationale for Evaluation and Treatment: Rehabilitation  THERAPY DIAG:  Other low back pain  Muscle spasm of back  Abnormal posture  ONSET DATE: Ongoing.  SUBJECTIVE:  SUBJECTIVE STATEMENT: Back pain up since starting exercises.    PERTINENT HISTORY:  See above.  PAIN:  Are you having pain? Yes: NPRS scale: 4/10 Pain location: LB, right buttock. Pain description: Ache. Aggravating factors: As above. Relieving factors: As above.  PRECAUTIONS: None  RED FLAGS: None   WEIGHT BEARING RESTRICTIONS: No  FALLS:  Has patient fallen in last 6 months? No  LIVING ENVIRONMENT: Lives with: lives with their spouse Lives in: House/apartment Has following equipment at home: None  OCCUPATION: Retired.  PLOF: Independent  PATIENT GOALS: Learn exercises and body mechanics.    OBJECTIVE:   06/12/23:  TM at 1.20 MPH x 10 minutes f/b patient  in [facility] position with folded pillow between   knees for comfort:  STW/M x 20 minutes to patient's bilateral lumbar musculature f/b HMP x 15 minutes.  Normal modality response following removal of modality.     Date:   06/08/23                                    EXERCISE LOG  8 weeks 06-05-23  Exercise Repetitions and Resistance Comments  AB bracing X 10 hold hold 5 secs   Seated ball press down X10 hold 5 secs   Seated marching X 6 hold 10 sec holds   Standing bird dog X 6 hold 10secs UE and then LE each         Blank cell = exercise not performed today  Reviewed AB bracing with transitional mvmnts and movement patterns modified with ADL's to prevent pain triggers.    Note: Objective measures were completed at Evaluation unless otherwise noted.  PATIENT SURVEYS:  FOTO 45.51.   POSTURE: rounded shoulders, forward head, and decreased lumbar lordosis  PALPATION: Mild tenderness in lumbar incisional area and some pain radiation into right buttock.  LUMBAR ROM:   Active lumbar flexion limited by 25% and extension to 15 degrees.   LOWER EXTREMITY MMT:    Normal LE strength. GAIT: Walking in slight flexed trunk posture with essentially normal gait cycle.     PATIENT EDUCATION:  Education details: Discussed exercise progression and body mechanics training. Person educated: Patient Education method: Explanation Education comprehension: verbalized understanding  HOME EXERCISE PROGRAM:   ASSESSMENT:  CLINICAL IMPRESSION: Patient's pain up a bit since starting exercise.  He had a very good response to STW/M with pain reduction following.     OBJECTIVE IMPAIRMENTS: decreased activity tolerance, increased muscle spasms, and pain.   ACTIVITY LIMITATIONS: carrying, lifting, bending, and standing  PARTICIPATION LIMITATIONS: laundry, community activity, and yard work  PERSONAL FACTORS: Time since onset of injury/illness/exacerbation and 1 comorbidity: prior lumbar  surgery  are also affecting patient's functional outcome.   REHAB POTENTIAL: Excellent  CLINICAL DECISION MAKING: Stable/uncomplicated  EVALUATION COMPLEXITY: Low   GOALS:  SHORT TERM GOALS: Target date: 07/01/23.  Ind with a HEP. Goal status: INITIAL  2.  Perform ADL's with pain not > 2-3/10.  Goal status: INITIAL  3.  Walk a community distance with pain not > 2-3/10.  Goal status: INITIAL PLAN:  PT FREQUENCY:  6 visits.  PT DURATION: other: 3-4 weeks.  PLANNED INTERVENTIONS: 97110-Therapeutic exercises, 97530- Therapeutic activity, [id number]- Neuromuscular re-education, 97535- Self Care, [id number]- Manual therapy, 97014- Electrical stimulation (unattended), [id number]- Ultrasound, Patient/Family education, Cryotherapy, and Moist heat.  PLAN FOR NEXT SESSION: Core exercise progression, spinal protection techniques and body mechanics training   Jerilee Space, [place],  PT 06/12/2023, 12:18 PM

## 2023-06-15 ENCOUNTER — Ambulatory Visit: Payer: Medicare Other | Admitting: Physical Therapy

## 2023-06-15 DIAGNOSIS — M62830 Muscle spasm of back: Secondary | ICD-10-CM

## 2023-06-15 DIAGNOSIS — M5459 Other low back pain: Secondary | ICD-10-CM | POA: Diagnosis not present

## 2023-06-15 DIAGNOSIS — R293 Abnormal posture: Secondary | ICD-10-CM

## 2023-06-15 NOTE — Therapy (Signed)
OUTPATIENT PHYSICAL THERAPY THORACOLUMBAR TREATMENT   Patient Name: [name] MRN: [id number] DOB:1946/02/15, 77 y.o., male Today's Date: 06/15/2023  END OF SESSION:  PT End of Session - 06/15/23 0939     Visit Number 4    Number of Visits 6    Date for PT Re-Evaluation 07/01/23    PT Start Time 0924    Activity Tolerance Patient tolerated treatment well    Behavior During Therapy [facility] for tasks assessed/performed             Past Medical History:  Diagnosis Date   ALLERGIC RHINITIS CAUSE UNSPECIFIED 05/27/2010   Arthritis    BACK PAIN, UPPER 02/21/2010   Blood transfusion without reported diagnosis    with knee replacement   Cervicalgia 05/27/2010   CHEST PAIN UNSPECIFIED 04/25/2009   DIVERTICULOSIS, COLON 03/09/2007   DIZZINESS, CHRONIC 03/09/2007   GERD 12/24/2006   HEADACHE, SINUS 04/06/2009   History of colon polyps    HYPERLIPIDEMIA 12/24/2006   diet controlled   HYPERTENSION 12/24/2006   better after weight loss   INSOMNIA WITH SLEEP APNEA UNSPECIFIED 05/27/2010   Leukocytosis 12/08/2019   Motion sickness    Neuromuscular disorder (HCC)    tremor   Neuropathy    Open wound of wrist, complicated 10/13/2007   OSTEOARTHRITIS 12/24/2006   Other constipation 11/06/2009   Pneumonia    PONV (postoperative nausea and vomiting)    PROSTATITIS, RECURRENT 12/30/2007   SHOULDER PAIN, BILATERAL 07/16/2007   Sleep apnea    Squamous cell carcinoma, ear, left    TRANSIENT ISCHEMIC ATTACKS, HX OF 04/06/2009   Past Surgical History:  Procedure Laterality Date   ANKLE ARTHROSCOPY W/ OPEN REPAIR  2002   Repair   COLONOSCOPY     COLONOSCOPY     around 2017. [place]    CYST EXCISION     from back 2015. 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   Tremor 04/10/2014   Elevated hemoglobin A1c 04/10/2014   Solitary pulmonary nodule 04/10/2014   Constipation, slow transit 08/14/2010   Allergic rhinitis 05/27/2010   Cervicalgia 05/27/2010   Insomnia with sleep apnea 05/27/2010   HEADACHE, SINUS 04/06/2009   SHOULDER PAIN, BILATERAL 07/16/2007   DIZZINESS, CHRONIC 03/09/2007   Hyperlipidemia 12/24/2006   Essential hypertension 12/24/2006   GERD 12/24/2006   Osteoarthritis 12/24/2006    REFERRING PROVIDER: Macon Large Meyran  REFERRING DIAG: Other interverbal disc displacement  Rationale for Evaluation and Treatment: Rehabilitation  THERAPY DIAG:  Other low back pain  Muscle spasm of back  Abnormal posture  ONSET DATE: Ongoing.  SUBJECTIVE:  SUBJECTIVE STATEMENT: Pain about a 4 with right buttock pain.  Walked about 3/4 of a mile a couple weeks ago and I got real sore ands hurting more.  PERTINENT HISTORY:  See above.  PAIN:  Are you having pain? Yes: NPRS scale: 4/10 Pain location: LB, right buttock. Pain description: Ache. Aggravating factors: As above. Relieving factors: As above.  PRECAUTIONS: None  RED FLAGS: None   WEIGHT BEARING RESTRICTIONS: No  FALLS:  Has patient fallen in last 6 months? No  LIVING ENVIRONMENT: Lives with: lives with their spouse Lives in: House/apartment Has following equipment at home: None  OCCUPATION: Retired.  PLOF: Independent  PATIENT GOALS: Learn exercises and body mechanics.    OBJECTIVE:   06/15/23:   TM at 1.20 MPH x 16 minutes f/b patient in [facility] position with folded pillow between   knees for comfort:  STW/M x 22 minutes to patient's bilateral lumbar musculature f/b HMP x 15 minutes.  Normal modality response following removal of modality.  06/12/23:  TM at 1.20 MPH x 10 minutes f/b patient in [facility] position with folded pillow between   knees for comfort:  STW/M x 20 minutes to patient's bilateral lumbar musculature f/b HMP x 15 minutes.  Normal modality response following removal of modality.     Date:   06/08/23                                    EXERCISE LOG  8 weeks 06-05-23  Exercise Repetitions and Resistance Comments  AB bracing X 10 hold hold 5 secs   Seated ball press down X10 hold 5 secs   Seated marching X 6 hold 10 sec holds   Standing bird dog X 6 hold 10secs UE and then LE each         Blank cell = exercise not performed today  Reviewed AB bracing with transitional mvmnts and movement patterns modified with ADL's to prevent pain triggers.    Note: Objective measures were completed at Evaluation unless otherwise noted.  PATIENT SURVEYS:  FOTO 45.51.   POSTURE: rounded shoulders, forward head, and decreased lumbar lordosis  PALPATION: Mild tenderness in lumbar incisional area and some pain radiation into right buttock.  LUMBAR ROM:   Active lumbar flexion limited by 25% and extension to 15 degrees.   LOWER EXTREMITY MMT:    Normal LE strength. GAIT: Walking in slight flexed trunk posture with essentially normal gait cycle.     PATIENT EDUCATION:  Education details: Discussed exercise progression and body mechanics training. Person educated: Patient Education method: Explanation Education comprehension: verbalized understanding  HOME EXERCISE PROGRAM:   ASSESSMENT:  CLINICAL IMPRESSION: Trigger point in right QL with good response to STW/M including ischemic release technique.    OBJECTIVE IMPAIRMENTS: decreased activity tolerance,  increased muscle spasms, and pain.   ACTIVITY LIMITATIONS: carrying, lifting, bending, and standing  PARTICIPATION LIMITATIONS: laundry, community activity, and yard work  PERSONAL FACTORS: Time since onset of injury/illness/exacerbation and 1 comorbidity: prior lumbar surgery  are also affecting patient's functional outcome.   REHAB POTENTIAL: Excellent  CLINICAL DECISION MAKING: Stable/uncomplicated  EVALUATION COMPLEXITY: Low   GOALS:  SHORT TERM GOALS: Target date: 07/01/23.  Ind with a HEP. Goal status: INITIAL  2.  Perform ADL's with pain not > 2-3/10.  Goal status: INITIAL  3.  Walk a community distance with pain not > 2-3/10.  Goal status: INITIAL PLAN:  PT  FREQUENCY:  6 visits.  PT DURATION: other: 3-4 weeks.  PLANNED INTERVENTIONS: 97110-Therapeutic exercises, 97530- Therapeutic activity, [id number]- Neuromuscular re-education, 97535- Self Care, 86578- Manual therapy, 97014- Electrical stimulation (unattended), [id number]- Ultrasound, Patient/Family education, Cryotherapy, and Moist heat.  PLAN FOR NEXT SESSION: Core exercise progression, spinal protection techniques and body mechanics training   Audreana Hancox, [place], PT 06/15/2023, 9:40 AM

## 2023-06-18 ENCOUNTER — Ambulatory Visit (INDEPENDENT_AMBULATORY_CARE_PROVIDER_SITE_OTHER): Payer: No Typology Code available for payment source | Admitting: Otolaryngology

## 2023-06-18 ENCOUNTER — Ambulatory Visit: Payer: Medicare Other

## 2023-06-18 VITALS — Ht 68.0 in | Wt 184.0 lb

## 2023-06-18 DIAGNOSIS — M62830 Muscle spasm of back: Secondary | ICD-10-CM

## 2023-06-18 DIAGNOSIS — R0981 Nasal congestion: Secondary | ICD-10-CM

## 2023-06-18 DIAGNOSIS — J328 Other chronic sinusitis: Secondary | ICD-10-CM | POA: Diagnosis not present

## 2023-06-18 DIAGNOSIS — J343 Hypertrophy of nasal turbinates: Secondary | ICD-10-CM

## 2023-06-18 DIAGNOSIS — H8111 Benign paroxysmal vertigo, right ear: Secondary | ICD-10-CM

## 2023-06-18 DIAGNOSIS — R293 Abnormal posture: Secondary | ICD-10-CM

## 2023-06-18 DIAGNOSIS — M5459 Other low back pain: Secondary | ICD-10-CM

## 2023-06-18 DIAGNOSIS — H9190 Unspecified hearing loss, unspecified ear: Secondary | ICD-10-CM | POA: Diagnosis not present

## 2023-06-18 DIAGNOSIS — J342 Deviated nasal septum: Secondary | ICD-10-CM

## 2023-06-18 MED ORDER — PREDNISONE 10 MG PO TABS: 10.0000 mg | ORAL_TABLET | Freq: Every day | ORAL | 0 refills | Status: AC

## 2023-06-18 NOTE — Progress Notes (Signed)
Dear Dr. Louis Meckel, Here is my assessment for our mutual patient, Sharbel Adamian. Thank you for allowing me the opportunity to care for your patient. Please do not hesitate to contact me should you have any other questions. Sincerely, Dr. Jovita Kussmaul  Otolaryngology Clinic Note  HISTORY: [name] is a 77 y.o. male kindly referred by Dr. Louis Meckel for evaluation of chronic sinusits  Initial visit (05/2023): He reports he has multiple sinus infections for several years. 2-3 infections/year. Mostly in fall and spring. He's had allergy tests (clear). Symptoms during include headache, ear discomfort and discolored drainage from the nose. Sense of smell is ok. No congestion or trouble breathing through the nose. He gets antibiotics for it with improvement.. No steroids. Between the episodes, he does feel normal. Finished abx two weeks ago. Allergy testing 15 years ago - not allergic to anything. He has tried saline spray, PO antihistamine (while ago), flonase. No astelin. He has not tried neti pot or saline rinses  He also reports vertigo when he lays on his side and turns his head. Lasts a minute or two, and then it goes away. He has tried meclizine but does not help. Had it for years, comes and goes. This has been ongoing for two months - he also has fairly significant motion sickness. Some tinnitus. No drainage from ears. He does get nauseated with it. Has not tried vestibular rehab. Does not get frequent ear infections.   Did have MRI and CT several years ago, available for review in chart and was reviewed.  He is currently using flonase.  GLP-1: no AP/AC: ASA 81  PMHx: Back Pain, HTN, BPH, T2DM, Insomnia, Inguinal hernia, Abnormal WBC (f/u [facility])  RADIOGRAPHIC EVALUATION AND INDEPENDENT REVIEW OF OTHER RECORDS:: Sinus CT (05/2023) at [facility]:  Noted [facility] (05/2023) which are reviewed and uploaded and available in media tab: multiple episodes of sinusitis over past 2  years with antibiotics; Performed sinus CT with MPT in ethmoid air cells and sphenoid and max. SE and FE recess and OM patient; Left nasal deviation  MRI Brain (07/2018): independently reviewed - No retorochlear lesions, no mastoid effusion or significant paranasal sinus disease CT Sinus 2020 report available but not images: no significant sinonasal opacification, left septal deviation  12/2022 CBC: WBC 13.7, Hgb 17, Eos 200   Past Medical History:  Diagnosis Date   ALLERGIC RHINITIS CAUSE UNSPECIFIED 05/27/2010   Arthritis    BACK PAIN, UPPER 02/21/2010   Blood transfusion without reported diagnosis    with knee replacement   Cervicalgia 05/27/2010   CHEST PAIN UNSPECIFIED 04/25/2009   DIVERTICULOSIS, COLON 03/09/2007   DIZZINESS, CHRONIC 03/09/2007   GERD 12/24/2006   HEADACHE, SINUS 04/06/2009   History of colon polyps    HYPERLIPIDEMIA 12/24/2006   diet controlled   HYPERTENSION 12/24/2006   better after weight loss   INSOMNIA WITH SLEEP APNEA UNSPECIFIED 05/27/2010   Leukocytosis 12/08/2019   Motion sickness    Neuromuscular disorder (HCC)    tremor   Neuropathy    Open wound of wrist, complicated 10/13/2007   OSTEOARTHRITIS 12/24/2006   Other constipation 11/06/2009   Pneumonia    PONV (postoperative nausea and vomiting)    PROSTATITIS, RECURRENT 12/30/2007   SHOULDER PAIN, BILATERAL 07/16/2007   Sleep apnea    Squamous cell carcinoma, ear, left    TRANSIENT ISCHEMIC ATTACKS, HX OF 04/06/2009   Past Surgical History:  Procedure Laterality Date   ANKLE ARTHROSCOPY W/ OPEN REPAIR  2002  Repair   COLONOSCOPY     COLONOSCOPY     around 2017. [place]    CYST EXCISION     from back 2015. likely this was meaning of ear cyst   EAR CYST EXCISION Bilateral 01/18/2013   Procedure: EXCISION CYSTS FROM BACK (TWO);  Surgeon: Velora Heckler, MD;  Location: [facility];  Service: General;  Laterality: Bilateral;   epidydmal cyst excision     ESOPHAGOGASTRODUODENOSCOPY      [place] around 2017   REPLACEMENT TOTAL KNEE BILATERAL  2005/2007   SKIN CANCER EXCISION  2020   ear, squamous cell   TONSILLECTOMY     Family History  Problem Relation Age of Onset   Kidney failure Mother 77   Breast cancer Mother    Achalasia Father    Depression Father    Heart disease Father    Diabetes Sister    Heart disease Brother 16       heavy smoker-died of MI   Colon cancer Neg Hx    Esophageal cancer Neg Hx    Colon polyps Neg Hx    Stomach cancer Neg Hx    Rectal cancer Neg Hx    Social History   Tobacco Use   Smoking status: Never   Smokeless tobacco: Never   Tobacco comments:    second hand smoke  Substance Use Topics   Alcohol use: Not Currently    Comment: quit 45 yrs ago   Allergies  Allergen Reactions   Codeine Nausea Only and Other (See Comments)    Other Reaction(s): GI Intolerance  REACTION: Nausea   Current Outpatient Medications  Medication Sig Dispense Refill   amitriptyline (ELAVIL) 50 MG tablet Take 1 tablet (50 mg total) by mouth at bedtime. (Patient taking differently: Take 75 mg by mouth at bedtime.) 90 tablet 1   aspirin EC 81 MG EC tablet Take 1 tablet (81 mg total) by mouth daily.     atorvastatin (LIPITOR) 80 MG tablet Take 80 mg by mouth daily.     lisinopril-hydrochlorothiazide (ZESTORETIC) 10-12.5 MG tablet TAKE 1 TABLET(S) EVERY DAY BY ORAL ROUTE IN THE MORNING FOR 90 DAYS.     meclizine (ANTIVERT) 12.5 MG tablet Take 1-2 tablets (12.5-25 mg total) by mouth every 6 (six) hours as needed for dizziness. 30 tablet 1   meloxicam (MOBIC) 15 MG tablet Take 15 mg by mouth daily.     pantoprazole (PROTONIX) 40 MG tablet TAKE 1 TABLET BY MOUTH EVERY DAY 90 tablet 1   tamsulosin (FLOMAX) 0.4 MG CAPS capsule Take 0.4 mg by mouth.     acetaminophen (TYLENOL) 500 MG tablet Take 1 tablet (500 mg total) by mouth every 6 (six) hours as needed. 30 tablet 0   Ascorbic Acid (VITAMIN C) 100 MG CHEW Chew 1 tablet by mouth daily.      Cyanocobalamin (VITAMIN B-12) 1000 MCG SUBL PLACE 1 TABLET (1,000 MCG TOTAL) UNDER THE TONGUE DAILY. 90 tablet 0   fluticasone (FLONASE) 50 MCG/ACT nasal spray Place 2 sprays into both nostrils daily. (Patient not taking: Reported on 01/15/2023) 16 g 6   gabapentin (NEURONTIN) 300 MG capsule Take by mouth.     ibuprofen (ADVIL) 400 MG tablet Take 1.5 tablets (600 mg total) by mouth 3 (three) times daily. 30 tablet 0   Lactobacillus Acid-Pectin (ACIDOPHILUS/PECTIN) CAPS Take 1 capsule every day by oral route in the morning for 30 days. (Patient not taking: Reported on 02/10/2023)     niacinamide 500  MG tablet Take 500 mg by mouth daily.     oxyCODONE-acetaminophen (PERCOCET/ROXICET) 5-325 MG tablet Take 1 tablet by mouth every 12 (twelve) hours as needed for severe pain. 8 tablet 0   promethazine (PHENERGAN) 25 MG tablet Take 2 tablets every 8 hours by oral route as needed for 90 days.     tiZANidine (ZANAFLEX) 4 MG capsule Take 1 capsule (4 mg total) by mouth 2 (two) times daily as needed for muscle spasms. 20 capsule 0   No current facility-administered medications for this visit.   Ht 5\' 8"  (1.727 m)   Wt 184 lb (83.5 kg)   BMI 27.98 kg/m   PHYSICAL EXAM:  Ht 5\' 8"  (1.727 m)   Wt 184 lb (83.5 kg)   BMI 27.98 kg/m    Salient findings:  CN II-XII intact Bilateral EAC clear and TM intact with well pneumatized middle ear spaces Head shake neg Dix hallpike neg today Nose: Anterior rhinoscopy reveals nasal septal deviation and bilateral ITH.  Nasal endoscopy was indicated to better evaluate the nose and paranasal sinuses, given the patient's history and exam findings, and is detailed below. No lesions of oral cavity/oropharynx No obviously palpable neck masses/lymphadenopathy/thyromegaly No respiratory distress or stridor   PROCEDURE: Diagnostic Nasal Endoscopy Pre-procedure diagnosis: Concern for chronic sinusitis Post-procedure diagnosis: same Indication: See pre-procedure diagnosis  and physical exam above Complications: None apparent EBL: 0 mL Anesthesia: Lidocaine 4% and topical decongestant was topically sprayed in each nasal cavity  Description of Procedure:  Patient was identified. A rigid 30 degree endoscope was utilized to evaluate the sinonasal cavities, mucosa, sinus ostia and turbinates and septum.  Overall, mild signs of mucosal inflammation are noted.  No mucopurulence, polyps, or masses noted.   Right Middle meatus: clear Right SE Recess: clear Left MM: clear Left SE Recess: clear   Photodocumentation was obtained.  CPT CODE -- 40981 - Mod 25   ASSESSMENT:  Mr. Doubek is a 77 y.o. M with:  1. Other chronic sinusitis   2. BPPV (benign paroxysmal positional vertigo), right   3. Subjective hearing loss   4. Hypertrophy of both inferior nasal turbinates   5. Nasal congestion   6. Nasal septal deviation    Multiple exacerbations over past 2 years with disease noted recent CT. He did finish abx about 3 weeks ago, no steroids. Using flonase. Prior allergy testing negative He also has some otologic sx that sound like BPPV for which he is interested in Therapy; Dix-Hallpike neg  We've discussed issues and options today.  We reviewed the nasal endoscopy images together.  The risks, benefits and alternatives were discussed and questions answered.  He has elected to proceed with finishing of medical management using steroids and will tart rinses and flonase; after that, will obtain scan to ensure this is not clearing:  1) Daily NeilMed Sinus rinses 2) Continue flonase BID 3) Recently finished Abx 4) Start prednisone burst x7d 5) Vestibular rehab f/u and Audio referral for baseline audio 2) Follow-up in 4-6 weeks with Sinus CT -- sooner as necessary.  See below regarding exact medications prescribed this encounter including dosages and route: Meds ordered this encounter  Medications   predniSONE (DELTASONE) 10 MG tablet    Sig: Take 1 tablet (10 mg  total) by mouth daily with breakfast for 7 days.    Dispense:  7 tablet    Refill:  0     Thank you for allowing me the opportunity to care for your patient. Please  do not hesitate to contact me should you have any other questions.  Sincerely, Jovita Kussmaul, MD Otolarynoglogist (ENT), [facility] Phone: [phone] Fax: [phone]  MDM:  Level 4: [phone] Complexity/Problems addressed: multiple chronic problems - one with exacerbation Data complexity: mod - independent interpretation of Imaging; review of notes/labs - Morbidity: mod  - Prescription Drug prescribed or managed: yes  06/26/2023, 10:39 AM

## 2023-06-18 NOTE — Patient Instructions (Signed)
I have ordered an imaging study for you to complete prior to your next visit. Please call [facility] Scheduling at [phone] to schedule your imaging if you have not received a call within 24 hours. If you are unable to complete your imaging study prior to your next scheduled visit please call our office to let us know.    [name] Med Nasal Saline Rinse   - start nasal saline rinses with NeilMed Bottle available over the counter    Nasal Saline Irrigation instructions: If you choose to make your own salt water solution, You will need: Salt (kosher, canning, or pickling salt) Baking soda Nasal irrigation bottle (i.e. [name] Med Sinus Rinse) Measuring spoon ( teaspoon) Distilled / boiled water   Mix solution Mix 1 teaspoon of salt, 1/2 teaspoon of baking soda and 1 cup of water into irrigation bottle ** May use saline packet instead of homemade recipe for this step if you prefer If medicine was prescribed to be mixed with solution, place this into bottle Examples 2 inches of 2% mupirocin ointment Budesonide solution Position your head: Lean over sink (about 45 degrees) Rotate head (about 45 degrees) so that one nostril is above the other Irrigate Insert tip of irrigation bottle into upper nostril so it forms a comfortable seal Irrigate while breathing through your mouth May remove the straw from the bottle in order to irrigate the entire solution (important if medicine was added) Exhale through nose when finished and blow nose as necessary  Repeat on opposite side with other 1/2 of solution (120 mL) or remake solution if all 240 mL was used on first side Wash irrigation bottle regularly, replace every 3 months

## 2023-06-18 NOTE — Therapy (Signed)
OUTPATIENT PHYSICAL THERAPY THORACOLUMBAR TREATMENT   Patient Name: [name] MRN: [id number] DOB:25-Sep-1945, 77 y.o., male Today's Date: 06/18/2023  END OF SESSION:  PT End of Session - 06/18/23 0938     Visit Number 5    Number of Visits 6    Date for PT Re-Evaluation 07/01/23    PT Start Time 0930    PT Stop Time 1015    PT Time Calculation (min) 45 min    Activity Tolerance Patient tolerated treatment well    Behavior During Therapy [facility] for tasks assessed/performed             Past Medical History:  Diagnosis Date   ALLERGIC RHINITIS CAUSE UNSPECIFIED 05/27/2010   Arthritis    BACK PAIN, UPPER 02/21/2010   Blood transfusion without reported diagnosis    with knee replacement   Cervicalgia 05/27/2010   CHEST PAIN UNSPECIFIED 04/25/2009   DIVERTICULOSIS, COLON 03/09/2007   DIZZINESS, CHRONIC 03/09/2007   GERD 12/24/2006   HEADACHE, SINUS 04/06/2009   History of colon polyps    HYPERLIPIDEMIA 12/24/2006   diet controlled   HYPERTENSION 12/24/2006   better after weight loss   INSOMNIA WITH SLEEP APNEA UNSPECIFIED 05/27/2010   Leukocytosis 12/08/2019   Motion sickness    Neuromuscular disorder (HCC)    tremor   Neuropathy    Open wound of wrist, complicated 10/13/2007   OSTEOARTHRITIS 12/24/2006   Other constipation 11/06/2009   Pneumonia    PONV (postoperative nausea and vomiting)    PROSTATITIS, RECURRENT 12/30/2007   SHOULDER PAIN, BILATERAL 07/16/2007   Sleep apnea    Squamous cell carcinoma, ear, left    TRANSIENT ISCHEMIC ATTACKS, HX OF 04/06/2009   Past Surgical History:  Procedure Laterality Date   ANKLE ARTHROSCOPY W/ OPEN REPAIR  2002   Repair   COLONOSCOPY     COLONOSCOPY     around 2017. [place]    CYST EXCISION     from back 2015. 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   Tremor 04/10/2014   Elevated hemoglobin A1c 04/10/2014   Solitary pulmonary nodule 04/10/2014   Constipation, slow transit 08/14/2010   Allergic rhinitis 05/27/2010   Cervicalgia 05/27/2010   Insomnia with sleep apnea 05/27/2010   HEADACHE, SINUS 04/06/2009   SHOULDER PAIN, BILATERAL 07/16/2007   DIZZINESS, CHRONIC 03/09/2007   Hyperlipidemia 12/24/2006   Essential hypertension 12/24/2006   GERD 12/24/2006   Osteoarthritis 12/24/2006    REFERRING PROVIDER: Macon Large Meyran  REFERRING DIAG: Other interverbal disc displacement  Rationale for Evaluation and Treatment: Rehabilitation  THERAPY DIAG:  Other low back pain  Muscle spasm of back  Abnormal posture  ONSET DATE: Ongoing.  SUBJECTIVE:  SUBJECTIVE STATEMENT: Pt reports 4/10 right low back and buttock pain.   PERTINENT HISTORY:  See above.  PAIN:  Are you having pain? Yes: NPRS scale: 4/10 Pain location: LB, right buttock. Pain description: Ache. Aggravating factors: As above. Relieving factors: As above.  PRECAUTIONS: None  RED FLAGS: None   WEIGHT BEARING RESTRICTIONS: No  FALLS:  Has patient fallen in last 6 months? No  LIVING ENVIRONMENT: Lives with: lives with their spouse Lives in: House/apartment Has following equipment at home: None  OCCUPATION: Retired.  PLOF: Independent  PATIENT GOALS: Learn exercises and body mechanics.    OBJECTIVE:   06/15/23:  TM at 1.20 MPH x 11 minutes f/b  patient in [facility] position with folded pillow between knees for comfort: HMP to right lumbar spine x 10 mins to decrease muscle tone prior to STW.  STW/M performed to patient's bilateral lumbar musculature    06/15/23:  TM at 1.20 MPH x 16 minutes f/b patient in [facility] position with folded pillow between   knees for comfort:  STW/M x 22 minutes to patient's bilateral lumbar musculature f/b HMP x 15 minutes.  Normal modality response following removal of modality.  06/12/23:  TM at 1.20 MPH x 10 minutes f/b patient in [facility] position with folded pillow between   knees for comfort:  STW/M x 20 minutes to patient's bilateral lumbar musculature f/b HMP x 15 minutes.  Normal modality response following removal of modality.  Note: Objective measures were completed at Evaluation unless otherwise noted.  PATIENT SURVEYS:  FOTO 45.51.   POSTURE: rounded shoulders, forward head, and decreased lumbar lordosis  PALPATION: Mild tenderness in lumbar incisional area and some pain radiation into right buttock.  LUMBAR ROM:   Active lumbar flexion limited by 25% and extension to 15 degrees.   LOWER EXTREMITY MMT:    Normal LE strength. GAIT: Walking in slight flexed trunk posture with essentially normal gait cycle.     PATIENT EDUCATION:  Education details: Discussed exercise progression and body mechanics training. Person educated: Patient Education method: Explanation Education comprehension: verbalized understanding  HOME EXERCISE PROGRAM:   ASSESSMENT:  CLINICAL IMPRESSION: Pt arrives for today's treatment session reporting 4/10 right low back and buttock pain.  During today's treatment session this PTA attempted using MH prior to STW to decrease muscle tone and assist with decreasing pain.  STW/M performed right lumbar paraspinals and QL to decrease pain and tone.  Pt reported decreased pain at completion of today's treatment session.  OBJECTIVE IMPAIRMENTS: decreased activity  tolerance, increased muscle spasms, and pain.   ACTIVITY LIMITATIONS: carrying, lifting, bending, and standing  PARTICIPATION LIMITATIONS: laundry, community activity, and yard work  PERSONAL FACTORS: Time since onset of injury/illness/exacerbation and 1 comorbidity: prior lumbar surgery  are also affecting patient's functional outcome.   REHAB POTENTIAL: Excellent  CLINICAL DECISION MAKING: Stable/uncomplicated  EVALUATION COMPLEXITY: Low   GOALS:  SHORT TERM GOALS: Target date: 07/01/23.  Ind with a HEP. Goal status: INITIAL  2.  Perform ADL's with pain not > 2-3/10.  Goal status: INITIAL  3.  Walk a community distance with pain not > 2-3/10.  Goal status: INITIAL PLAN:  PT FREQUENCY:  6 visits.  PT DURATION: other: 3-4 weeks.  PLANNED INTERVENTIONS: 97110-Therapeutic exercises, 97530- Therapeutic activity, [id number]- Neuromuscular re-education, 97535- Self Care, 40981- Manual therapy, 97014- Electrical stimulation (unattended), [id number]- Ultrasound, Patient/Family education, Cryotherapy, and Moist heat.  PLAN FOR NEXT SESSION: Core exercise progression, spinal protection techniques and body mechanics training   Victorino Dike A  Aundria Rud, PTA 06/18/2023, 11:13 AM

## 2023-06-23 ENCOUNTER — Ambulatory Visit: Payer: No Typology Code available for payment source | Admitting: Physical Therapy

## 2023-06-23 DIAGNOSIS — M62830 Muscle spasm of back: Secondary | ICD-10-CM

## 2023-06-23 DIAGNOSIS — M5459 Other low back pain: Secondary | ICD-10-CM | POA: Diagnosis not present

## 2023-06-23 DIAGNOSIS — R293 Abnormal posture: Secondary | ICD-10-CM

## 2023-06-23 NOTE — Therapy (Signed)
OUTPATIENT PHYSICAL THERAPY THORACOLUMBAR TREATMENT   Patient Name: [name] MRN: [id number] DOB:1945-07-18, 77 y.o., male Today's Date: 06/23/2023  END OF SESSION:  PT End of Session - 06/23/23 1056     Visit Number 6    Number of Visits 6    Date for PT Re-Evaluation 07/01/23    PT Start Time 0930    PT Stop Time 1015    PT Time Calculation (min) 45 min    Activity Tolerance Patient tolerated treatment well    Behavior During Therapy [facility] for tasks assessed/performed             Past Medical History:  Diagnosis Date   ALLERGIC RHINITIS CAUSE UNSPECIFIED 05/27/2010   Arthritis    BACK PAIN, UPPER 02/21/2010   Blood transfusion without reported diagnosis    with knee replacement   Cervicalgia 05/27/2010   CHEST PAIN UNSPECIFIED 04/25/2009   DIVERTICULOSIS, COLON 03/09/2007   DIZZINESS, CHRONIC 03/09/2007   GERD 12/24/2006   HEADACHE, SINUS 04/06/2009   History of colon polyps    HYPERLIPIDEMIA 12/24/2006   diet controlled   HYPERTENSION 12/24/2006   better after weight loss   INSOMNIA WITH SLEEP APNEA UNSPECIFIED 05/27/2010   Leukocytosis 12/08/2019   Motion sickness    Neuromuscular disorder (HCC)    tremor   Neuropathy    Open wound of wrist, complicated 10/13/2007   OSTEOARTHRITIS 12/24/2006   Other constipation 11/06/2009   Pneumonia    PONV (postoperative nausea and vomiting)    PROSTATITIS, RECURRENT 12/30/2007   SHOULDER PAIN, BILATERAL 07/16/2007   Sleep apnea    Squamous cell carcinoma, ear, left    TRANSIENT ISCHEMIC ATTACKS, HX OF 04/06/2009   Past Surgical History:  Procedure Laterality Date   ANKLE ARTHROSCOPY W/ OPEN REPAIR  2002   Repair   COLONOSCOPY     COLONOSCOPY     around 2017. [place]    CYST EXCISION     from back 2015. 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   Tremor 04/10/2014   Elevated hemoglobin A1c 04/10/2014   Solitary pulmonary nodule 04/10/2014   Constipation, slow transit 08/14/2010   Allergic rhinitis 05/27/2010   Cervicalgia 05/27/2010   Insomnia with sleep apnea 05/27/2010   HEADACHE, SINUS 04/06/2009   SHOULDER PAIN, BILATERAL 07/16/2007   DIZZINESS, CHRONIC 03/09/2007   Hyperlipidemia 12/24/2006   Essential hypertension 12/24/2006   GERD 12/24/2006   Osteoarthritis 12/24/2006    REFERRING PROVIDER: Macon Large Meyran  REFERRING DIAG: Other interverbal disc displacement  Rationale for Evaluation and Treatment: Rehabilitation  THERAPY DIAG:  Other low back pain  Muscle spasm of back  Abnormal posture  ONSET DATE: Ongoing.  SUBJECTIVE:  SUBJECTIVE STATEMENT: Pain down to a 3.  PERTINENT HISTORY:  See above.  PAIN:  Are you having pain? Yes: NPRS scale: 3/10 Pain location: LB, right buttock. Pain description: Ache. Aggravating factors: As above. Relieving factors: As above.  PRECAUTIONS: None  RED FLAGS: None   WEIGHT BEARING RESTRICTIONS: No  FALLS:  Has patient fallen in last 6 months? No  LIVING ENVIRONMENT: Lives with: lives with their spouse Lives in: House/apartment Has following equipment at home: None  OCCUPATION: Retired.  PLOF: Independent  PATIENT GOALS: Learn exercises and body mechanics.    OBJECTIVE:     06/23/23:  TM at 1.50 MPH x 15 minutes f/b patient in [facility] position  with folded pillow between knees for comfort: HMP to right lumbar spine x 23 mins to decrease muscle tone prior to STW.    06/18/23:  TM at 1.20 MPH x 11 minutes f/b patient in [facility] position with folded pillow between knees for comfort: HMP to right lumbar spine x 10 mins to decrease muscle tone prior to STW.  STW/M performed to patient's bilateral lumbar musculature    06/15/23:  TM at 1.20 MPH x 16 minutes f/b patient in [facility] position with folded pillow between   knees for comfort:  STW/M x 22 minutes to patient's bilateral lumbar musculature f/b HMP x 15 minutes.  Normal modality response following removal of modality.  06/12/23:  TM at 1.20 MPH x 10 minutes f/b patient in [facility] position with folded pillow between   knees for comfort:  STW/M x 20 minutes to patient's bilateral lumbar musculature f/b HMP x 15 minutes.  Normal modality response following removal of modality.  Note: Objective measures were completed at Evaluation unless otherwise noted.  PATIENT SURVEYS:  FOTO 45.51.   POSTURE: rounded shoulders, forward head, and decreased lumbar lordosis  PALPATION: Mild tenderness in lumbar incisional area and some pain radiation into right buttock.  LUMBAR ROM:   Active lumbar flexion limited by 25% and extension to 15 degrees.   LOWER EXTREMITY MMT:    Normal LE strength. GAIT: Walking in slight flexed trunk posture with essentially normal gait cycle.     PATIENT EDUCATION:  Education details: Discussed exercise progression and body mechanics training. Person educated: Patient Education method: Explanation Education comprehension: verbalized understanding  HOME EXERCISE PROGRAM:   ASSESSMENT:  CLINICAL IMPRESSION: Patient doing very well with STW/M.  He had a trigger point in his right QL with an excellent response today.  OBJECTIVE IMPAIRMENTS: decreased activity tolerance, increased muscle spasms, and pain.   ACTIVITY LIMITATIONS: carrying, lifting, bending,  and standing  PARTICIPATION LIMITATIONS: laundry, community activity, and yard work  PERSONAL FACTORS: Time since onset of injury/illness/exacerbation and 1 comorbidity: prior lumbar surgery  are also affecting patient's functional outcome.   REHAB POTENTIAL: Excellent  CLINICAL DECISION MAKING: Stable/uncomplicated  EVALUATION COMPLEXITY: Low   GOALS:  SHORT TERM GOALS: Target date: 07/01/23.  Ind with a HEP. Goal status: INITIAL  2.  Perform ADL's with pain not > 2-3/10.  Goal status: INITIAL  3.  Walk a community distance with pain not > 2-3/10.  Goal status: INITIAL PLAN:  PT FREQUENCY:  6 visits.  PT DURATION: other: 3-4 weeks.  PLANNED INTERVENTIONS: 97110-Therapeutic exercises, 97530- Therapeutic activity, [id number]- Neuromuscular re-education, 97535- Self Care, 96045- Manual therapy, 97014- Electrical stimulation (unattended), [id number]- Ultrasound, Patient/Family education, Cryotherapy, and Moist heat.  PLAN FOR NEXT SESSION: Core exercise progression, spinal protection techniques and body mechanics training   Dagmar Adcox, [place], PT 06/23/2023, 11:03  AM

## 2023-06-26 ENCOUNTER — Telehealth (INDEPENDENT_AMBULATORY_CARE_PROVIDER_SITE_OTHER): Payer: Self-pay

## 2023-06-26 ENCOUNTER — Encounter (INDEPENDENT_AMBULATORY_CARE_PROVIDER_SITE_OTHER): Payer: Self-pay

## 2023-06-26 NOTE — Telephone Encounter (Signed)
Was calling pt to see if or when he can get his ct scan done that was ordered on the 19th. Called radiology scheduling spoke with

## 2023-06-26 NOTE — Telephone Encounter (Signed)
Called  [facility] in [name] to get ct scan read and imaging and report. Then faxed it to the doctor and the person who sent over his referral information. DR Allena Katz requested this imaging it was done on 06-04-2023. He needs this.

## 2023-06-29 ENCOUNTER — Encounter: Payer: Self-pay | Admitting: Physical Therapy

## 2023-06-29 ENCOUNTER — Telehealth (INDEPENDENT_AMBULATORY_CARE_PROVIDER_SITE_OTHER): Payer: Self-pay

## 2023-06-29 ENCOUNTER — Ambulatory Visit: Payer: Medicare Other | Admitting: Physical Therapy

## 2023-06-29 DIAGNOSIS — M5459 Other low back pain: Secondary | ICD-10-CM | POA: Diagnosis not present

## 2023-06-29 DIAGNOSIS — M62830 Muscle spasm of back: Secondary | ICD-10-CM

## 2023-06-29 DIAGNOSIS — R293 Abnormal posture: Secondary | ICD-10-CM

## 2023-06-29 NOTE — Therapy (Signed)
OUTPATIENT PHYSICAL THERAPY THORACOLUMBAR TREATMENT   Patient Name: [name] MRN: [id number] DOB:December 22, 1945, 77 y.o., male Today's Date: 06/29/2023  END OF SESSION:  PT End of Session - 06/29/23 1212     Visit Number 7    Number of Visits 10    Date for PT Re-Evaluation 07/15/23    PT Start Time 1015    PT Stop Time 1057    PT Time Calculation (min) 42 min    Activity Tolerance Patient tolerated treatment well    Behavior During Therapy [facility] for tasks assessed/performed             Past Medical History:  Diagnosis Date   ALLERGIC RHINITIS CAUSE UNSPECIFIED 05/27/2010   Arthritis    BACK PAIN, UPPER 02/21/2010   Blood transfusion without reported diagnosis    with knee replacement   Cervicalgia 05/27/2010   CHEST PAIN UNSPECIFIED 04/25/2009   DIVERTICULOSIS, COLON 03/09/2007   DIZZINESS, CHRONIC 03/09/2007   GERD 12/24/2006   HEADACHE, SINUS 04/06/2009   History of colon polyps    HYPERLIPIDEMIA 12/24/2006   diet controlled   HYPERTENSION 12/24/2006   better after weight loss   INSOMNIA WITH SLEEP APNEA UNSPECIFIED 05/27/2010   Leukocytosis 12/08/2019   Motion sickness    Neuromuscular disorder (HCC)    tremor   Neuropathy    Open wound of wrist, complicated 10/13/2007   OSTEOARTHRITIS 12/24/2006   Other constipation 11/06/2009   Pneumonia    PONV (postoperative nausea and vomiting)    PROSTATITIS, RECURRENT 12/30/2007   SHOULDER PAIN, BILATERAL 07/16/2007   Sleep apnea    Squamous cell carcinoma, ear, left    TRANSIENT ISCHEMIC ATTACKS, HX OF 04/06/2009   Past Surgical History:  Procedure Laterality Date   ANKLE ARTHROSCOPY W/ OPEN REPAIR  2002   Repair   COLONOSCOPY     COLONOSCOPY     around 2017. [place]    CYST EXCISION     from back 2015. 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   Tremor 04/10/2014   Elevated hemoglobin A1c 04/10/2014   Solitary pulmonary nodule 04/10/2014   Constipation, slow transit 08/14/2010   Allergic rhinitis 05/27/2010   Cervicalgia 05/27/2010   Insomnia with sleep apnea 05/27/2010   HEADACHE, SINUS 04/06/2009   SHOULDER PAIN, BILATERAL 07/16/2007   DIZZINESS, CHRONIC 03/09/2007   Hyperlipidemia 12/24/2006   Essential hypertension 12/24/2006   GERD 12/24/2006   Osteoarthritis 12/24/2006    REFERRING PROVIDER: Macon Large Meyran  REFERRING DIAG: Other interverbal disc displacement  Rationale for Evaluation and Treatment: Rehabilitation  THERAPY DIAG:  Other low back pain  Muscle spasm of back  Abnormal posture  ONSET DATE: Ongoing.  SUBJECTIVE:  SUBJECTIVE STATEMENT: Pain down to a 3.  PERTINENT HISTORY:  See above.  PAIN:  Are you having pain? Yes: NPRS scale: 3/10 Pain location: LB, right buttock. Pain description: Ache. Aggravating factors: As above. Relieving factors: As above.  PRECAUTIONS: None  RED FLAGS: None   WEIGHT BEARING RESTRICTIONS: No  FALLS:  Has patient fallen in last 6 months? No  LIVING ENVIRONMENT: Lives with: lives with their spouse Lives in: House/apartment Has following equipment at home: None  OCCUPATION: Retired.  PLOF: Independent  PATIENT GOALS: Learn exercises and body mechanics.    OBJECTIVE:   06/29/23: TM progressing to 2.5 MPH x 20 minutes f/b patient  in [facility] position with folded pillow between knees for comfort: HMP to right lumbar spine musculature and proximal glutes x 18 mins to decrease muscle tone prior to STW.      06/23/23:  TM at 1.50 MPH x 15 minutes f/b patient in [facility] position with folded pillow between knees for comfort: HMP to right lumbar spine x 23 mins to decrease muscle tone prior to STW.    06/18/23:  TM at 1.20 MPH x 11 minutes f/b patient in [facility] position with folded pillow between knees for comfort: HMP to right lumbar spine x 10 mins to decrease muscle tone prior to STW.  STW/M performed to patient's bilateral lumbar musculature      Note: Objective measures were completed at Evaluation unless otherwise noted.  PATIENT SURVEYS:  FOTO 45.51.   POSTURE: rounded shoulders, forward head, and decreased lumbar lordosis  PALPATION: Mild tenderness in lumbar incisional area and some pain radiation into right buttock.  LUMBAR ROM:   Active lumbar flexion limited by 25% and extension to 15 degrees.   LOWER EXTREMITY MMT:    Normal LE strength. GAIT: Walking in slight flexed trunk posture with essentially normal gait cycle.     PATIENT EDUCATION:  Education details: Discussed exercise progression and body mechanics training. Person educated: Patient Education method: Explanation Education comprehension: verbalized understanding  HOME EXERCISE PROGRAM:   ASSESSMENT:  CLINICAL IMPRESSION: Patient doing very well with STW/M.  He had a trigger point in his right proximal glut musculature and felt good after treatment.    OBJECTIVE IMPAIRMENTS: decreased activity tolerance, increased muscle spasms, and pain.   ACTIVITY LIMITATIONS: carrying, lifting, bending, and standing  PARTICIPATION LIMITATIONS: laundry, community activity, and yard work  PERSONAL FACTORS: Time since onset of injury/illness/exacerbation and 1 comorbidity: prior lumbar surgery  are also affecting patient's functional outcome.    REHAB POTENTIAL: Excellent  CLINICAL DECISION MAKING: Stable/uncomplicated  EVALUATION COMPLEXITY: Low   GOALS:  SHORT TERM GOALS: Target date: 07/01/23.  Ind with a HEP. Goal status: INITIAL  2.  Perform ADL's with pain not > 2-3/10.  Goal status: INITIAL  3.  Walk a community distance with pain not > 2-3/10.  Goal status: INITIAL PLAN:  PT FREQUENCY:  6 visits.  PT DURATION: other: 3-4 weeks.  PLANNED INTERVENTIONS: 97110-Therapeutic exercises, 97530- Therapeutic activity, [id number]- Neuromuscular re-education, 97535- Self Care, 01027- Manual therapy, 97014- Electrical stimulation (unattended), [id number]- Ultrasound, Patient/Family education, Cryotherapy, and Moist heat.  PLAN FOR NEXT SESSION: Core exercise progression, spinal protection techniques and body mechanics training   Elmo Rio, [place], PT 06/29/2023, 12:15 PM

## 2023-06-29 NOTE — Telephone Encounter (Signed)
Called taylor Katrinka Blazing need to get patients CT scan report and imaging for patel that was done on 06-04-2023. Wanted to see if we can get a CD of the images and the report for Dr. Allena Katz.

## 2023-06-30 ENCOUNTER — Telehealth (INDEPENDENT_AMBULATORY_CARE_PROVIDER_SITE_OTHER): Payer: Self-pay

## 2023-06-30 NOTE — Telephone Encounter (Signed)
Spoke with kelly to get patients CT scan and imaging records over here. She is going to send the CD to the office and she already sent the CT report by fax for patient

## 2023-07-02 ENCOUNTER — Encounter: Payer: Medicare Other | Admitting: Physical Therapy

## 2023-07-29 ENCOUNTER — Telehealth (INDEPENDENT_AMBULATORY_CARE_PROVIDER_SITE_OTHER): Payer: Self-pay | Admitting: Audiology

## 2023-07-29 ENCOUNTER — Telehealth (INDEPENDENT_AMBULATORY_CARE_PROVIDER_SITE_OTHER): Payer: Self-pay | Admitting: Otolaryngology

## 2023-07-29 NOTE — Telephone Encounter (Signed)
Reminder Call: Date: 07/30/2023 Status: Sch  Time: 10:30 AM [address] [place], [place] [address]  Confirmed time and location w/patient.

## 2023-07-29 NOTE — Telephone Encounter (Signed)
Reminder Call: Date: 07/30/2023 Status: Sch  Time: 10:00 AM [address] [place], [place] [address]  Confirmed time and location w/patient.

## 2023-07-30 ENCOUNTER — Encounter (INDEPENDENT_AMBULATORY_CARE_PROVIDER_SITE_OTHER): Payer: Self-pay

## 2023-07-30 ENCOUNTER — Ambulatory Visit (INDEPENDENT_AMBULATORY_CARE_PROVIDER_SITE_OTHER): Payer: No Typology Code available for payment source | Admitting: Otolaryngology

## 2023-07-30 ENCOUNTER — Ambulatory Visit (INDEPENDENT_AMBULATORY_CARE_PROVIDER_SITE_OTHER): Payer: No Typology Code available for payment source | Admitting: Audiology

## 2023-07-30 VITALS — BP 108/72 | HR 86 | Resp 19 | Ht 68.0 in | Wt 184.0 lb

## 2023-07-30 DIAGNOSIS — R0981 Nasal congestion: Secondary | ICD-10-CM | POA: Diagnosis not present

## 2023-07-30 DIAGNOSIS — J343 Hypertrophy of nasal turbinates: Secondary | ICD-10-CM | POA: Diagnosis not present

## 2023-07-30 DIAGNOSIS — H8111 Benign paroxysmal vertigo, right ear: Secondary | ICD-10-CM

## 2023-07-30 DIAGNOSIS — J328 Other chronic sinusitis: Secondary | ICD-10-CM | POA: Diagnosis not present

## 2023-07-30 DIAGNOSIS — J342 Deviated nasal septum: Secondary | ICD-10-CM

## 2023-07-30 DIAGNOSIS — H903 Sensorineural hearing loss, bilateral: Secondary | ICD-10-CM

## 2023-07-30 NOTE — Progress Notes (Signed)
  [address], [address] [place], [place] [address] [phone]  Audiological Evaluation    Name: [name]     DOB:   1945/09/21      MRN:   [id number]                                                                                     Service Date: 07/30/2023     Accompanied by: unaccompanied    Patient comes today after Dr. Allena Katz, ENT sent a referral for a hearing evaluation due to concerns with hearing loss.   Symptoms Yes Details  Hearing loss  [x]  Reports has hearing loss, maybe worse in the right ear. He recalls that he was once told to have a right sided tumor (vestibular schwannoma/acoustic neuroma), but recent MRI's reports do not mention finding a vestibular schwannoma.   Tinnitus  [x]  Both ears, comes and goes  Ear pain/ Ear infections  [x]  Right ear pain (temporal bone area) . Reports sinus issues.  Balance problems  [x]  Possibly some BPPV  that already resolved.  Noise exposure  [x]  [employer]  Previous ear surgeries  []    Family history  []    Amplification  []    Other  []      Otoscopy: Right ear: Clear external ear canals and notable landmarks visualized on the tympanic membrane. Left ear:  Clear external ear canals and notable landmarks visualized on the tympanic membrane.  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   Speech Audiometry: Right ear- Speech Reception Threshold (SRT) was obtained at 25 dBHL Left ear-Speech Reception Threshold (SRT) was obtained at 20 dBHL   Word Recognition Score Tested using NU-6 (MLV) Right ear: 84% was obtained at a presentation level of 70 dBHL with contralateral masking which is deemed as  good  Left ear: 92%  was obtained at a presentation level of 70 dBHL with contralateral masking which is deemed as  excellent    The hearing test results were completed under headphones and results are deemed to be of good reliability. Test technique:  conventional    Recommendations: Follow up with ENT as needed. Return for a hearing evaluation if concerns with hearing changes arise or per MD recommendation.  Consider a communication needs assessment after medical clearance for hearing aids is obtained.   Finlay Godbee MARIE LEROUX-MARTINEZ, AUD

## 2023-07-30 NOTE — Progress Notes (Signed)
Otolaryngology Clinic Note  HISTORY: [name] is a 78 y.o. male kindly referred by Dr. No ref. provider found for evaluation of chronic sinusits  Initial visit (05/2023): He reports he has multiple sinus infections for several years. 2-3 infections/year. Mostly in fall and spring. He's had allergy tests (clear). Symptoms during include headache, ear discomfort and discolored drainage from the nose. Sense of smell is ok. No congestion or trouble breathing through the nose. He gets antibiotics for it with improvement.. No steroids. Between the episodes, he does feel normal. Finished abx two weeks ago. Allergy testing 15 years ago - not allergic to anything. He has tried saline spray, PO antihistamine (while ago), flonase. No astelin. He has not tried neti pot or saline rinses  He also reports vertigo when he lays on his side and turns his head. Lasts a minute or two, and then it goes away. He has tried meclizine but does not help. Had it for years, comes and goes. This has been ongoing for two months - he also has fairly significant motion sickness. Some tinnitus. No drainage from ears. He does get nauseated with it. Has not tried vestibular rehab. Does not get frequent ear infections.  Does have noise exposure from [employer].  Did have MRI and CT several years ago, available for review in chart and was reviewed.  He is currently using flonase. --------------------------------------------------------- 07/30/2023 He reports that he has been doing well from sinus standpoint. No headache, discolored drainage, sense of smell decline, congestion, or trouble breathing through the nose. No antibiotic or steroid use. Using flonase. Using rinses every other day. OTC allergy pill has also improved his symptoms.  Vertigo has resolved. 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 Hearing continues to slowly decline.  He is scheduled for hernia repair in Feb 2025.  We did review his CT from the [place].  He said he did not get his CT - no one called him or vestibular eval Did get audio today  -------------------------------------------------------  GLP-1: no AP/AC: ASA 81  PMHx: Back Pain, HTN, BPH, T2DM, Insomnia, Inguinal hernia, Abnormal WBC (f/u [place] [facility])  RADIOGRAPHIC EVALUATION AND INDEPENDENT REVIEW OF OTHER RECORDS:: Sinus CT (05/2023) at [facility]: independently reviewed now and agree with read  Noted [facility] (05/2023) which are reviewed and uploaded and available in media tab: multiple episodes of sinusitis over past 2 years with antibiotics; Performed sinus CT with MPT in ethmoid air cells and sphenoid and max. SE and FE recess and OM patient; Left nasal deviation  MRI Brain (07/2018): independently reviewed - No retorochlear lesions, no mastoid effusion or significant paranasal sinus disease CT Sinus 2020 report available but not images: no significant sinonasal opacification, left septal deviation  12/2022 CBC: WBC 13.7, Hgb 17, Eos 200  07/2023 Audiogram was independently reviewed and interpreted by me and agree with read: A/A tymps Pure tone Audiometry: Normal to moderate sensorineural hearing loss from 250 Hz - 8000 Hz, in both ears. Right ear: 84% was obtained at a presentation level of 70 dBHL with contralateral masking which is deemed as  good  Left ear: 92% was obtained at a presentation level of 70 dBHL with contralateral masking which is deemed as  excellent SNHL= Sensorineural hearing loss    Past Medical History:  Diagnosis Date   ALLERGIC RHINITIS CAUSE UNSPECIFIED 05/27/2010   Arthritis    BACK PAIN, UPPER 02/21/2010   Blood transfusion without reported diagnosis    with knee replacement   Cervicalgia 05/27/2010   CHEST PAIN UNSPECIFIED 04/25/2009   DIVERTICULOSIS, COLON  03/09/2007   DIZZINESS, CHRONIC 03/09/2007   GERD 12/24/2006   HEADACHE, SINUS 04/06/2009   History of colon polyps    HYPERLIPIDEMIA 12/24/2006   diet controlled   HYPERTENSION 12/24/2006   better after weight loss   INSOMNIA WITH SLEEP APNEA UNSPECIFIED 05/27/2010   Leukocytosis 12/08/2019   Motion sickness    Neuromuscular disorder (HCC)    tremor   Neuropathy    Open wound of wrist, complicated 10/13/2007   OSTEOARTHRITIS 12/24/2006   Other constipation 11/06/2009   Pneumonia    PONV (postoperative nausea and vomiting)    PROSTATITIS, RECURRENT 12/30/2007   SHOULDER PAIN, BILATERAL 07/16/2007   Sleep apnea    Squamous cell carcinoma, ear, left    TRANSIENT ISCHEMIC ATTACKS, HX OF 04/06/2009   Past Surgical History:  Procedure Laterality Date   ANKLE ARTHROSCOPY W/ OPEN REPAIR  2002   Repair   COLONOSCOPY     COLONOSCOPY     around 2017. [place]    CYST EXCISION     from back 2015. likely this was meaning of ear cyst   EAR CYST EXCISION Bilateral 01/18/2013   Procedure: EXCISION CYSTS FROM BACK (TWO);  Surgeon: Velora Heckler, MD;  Location: [facility];  Service: General;  Laterality: Bilateral;   epidydmal cyst excision     ESOPHAGOGASTRODUODENOSCOPY     [place] around 2017   REPLACEMENT TOTAL KNEE BILATERAL  2005/2007   SKIN CANCER EXCISION  2020   ear, squamous cell   TONSILLECTOMY     Family History  Problem Relation Age of Onset   Kidney failure Mother 61   Breast cancer Mother    Achalasia Father    Depression Father    Heart disease Father    Diabetes Sister    Heart disease Brother 29       heavy smoker-died of MI   Colon cancer Neg Hx    Esophageal cancer Neg Hx    Colon polyps Neg Hx    Stomach cancer Neg Hx    Rectal cancer Neg Hx    Social History   Tobacco Use   Smoking status: Never   Smokeless tobacco: Never   Tobacco comments:    second hand smoke  Substance Use Topics   Alcohol use: Not Currently    Comment: quit 45 yrs ago    Allergies  Allergen Reactions   Codeine Nausea Only and Other (See Comments)    Other Reaction(s): GI Intolerance  REACTION: Nausea   Current Outpatient Medications  Medication Sig Dispense Refill   acetaminophen (TYLENOL) 500 MG tablet Take 1 tablet (500 mg total) by mouth every 6 (six) hours as needed. 30 tablet 0   amitriptyline (ELAVIL) 50 MG tablet Take 1 tablet (50 mg total) by mouth at bedtime. (Patient taking differently: Take 75 mg by mouth at bedtime.) 90 tablet 1   Ascorbic Acid (VITAMIN C) 100 MG CHEW Chew 1 tablet by mouth daily.     aspirin EC 81 MG EC tablet Take 1 tablet (81 mg total) by mouth daily.     atorvastatin (LIPITOR) 80 MG tablet Take 80 mg by mouth daily.     Cyanocobalamin (VITAMIN B-12) 1000 MCG SUBL PLACE 1  TABLET (1,000 MCG TOTAL) UNDER THE TONGUE DAILY. 90 tablet 0   empagliflozin (JARDIANCE) 25 MG TABS tablet Take 25 mg by mouth daily.     fluticasone (FLONASE) 50 MCG/ACT nasal spray Place 2 sprays into both nostrils daily. 16 g 6   gabapentin (NEURONTIN) 300 MG capsule Take by mouth.     ibuprofen (ADVIL) 400 MG tablet Take 1.5 tablets (600 mg total) by mouth 3 (three) times daily. 30 tablet 0   Lactobacillus Acid-Pectin (ACIDOPHILUS/PECTIN) CAPS      lisinopril-hydrochlorothiazide (ZESTORETIC) 10-12.5 MG tablet TAKE 1 TABLET(S) EVERY DAY BY ORAL ROUTE IN THE MORNING FOR 90 DAYS.     meclizine (ANTIVERT) 12.5 MG tablet Take 1-2 tablets (12.5-25 mg total) by mouth every 6 (six) hours as needed for dizziness. 30 tablet 1   meloxicam (MOBIC) 15 MG tablet Take 15 mg by mouth daily.     niacinamide 500 MG tablet Take 500 mg by mouth daily.     oxyCODONE-acetaminophen (PERCOCET/ROXICET) 5-325 MG tablet Take 1 tablet by mouth every 12 (twelve) hours as needed for severe pain. 8 tablet 0   pantoprazole (PROTONIX) 40 MG tablet TAKE 1 TABLET BY MOUTH EVERY DAY 90 tablet 1   promethazine (PHENERGAN) 25 MG tablet Take 2 tablets every 8 hours by oral route as needed  for 90 days.     tamsulosin (FLOMAX) 0.4 MG CAPS capsule Take 0.4 mg by mouth.     tiZANidine (ZANAFLEX) 4 MG capsule Take 1 capsule (4 mg total) by mouth 2 (two) times daily as needed for muscle spasms. 20 capsule 0   No current facility-administered medications for this visit.   BP 108/72 (BP Location: Left Arm, Patient Position: Sitting, Cuff Size: Normal)   Pulse 86   Resp 19   Ht 5\' 8"  (1.727 m)   Wt 184 lb (83.5 kg)   SpO2 93%   BMI 27.98 kg/m   PHYSICAL EXAM:  BP 108/72 (BP Location: Left Arm, Patient Position: Sitting, Cuff Size: Normal)   Pulse 86   Resp 19   Ht 5\' 8"  (1.727 m)   Wt 184 lb (83.5 kg)   SpO2 93%   BMI 27.98 kg/m    Salient findings:  CN II-XII intact Bilateral EAC clear and TM intact with well pneumatized middle ear spaces Nose: Anterior rhinoscopy reveals nasal septal deviation and bilateral ITH.  Nasal endoscopy was indicated to better evaluate the nose and paranasal sinuses, given the patient's history and exam findings and lack of post-treatment CT scan and is documented below No obviously palpable neck masses/lymphadenopathy/thyromegaly No respiratory distress or stridor   PROCEDURE: Diagnostic Nasal Endoscopy Pre-procedure diagnosis: Concern for chronic sinusitis, post-treatment evaluation Post-procedure diagnosis: same Indication: See pre-procedure diagnosis and physical exam above Complications: None apparent EBL: 0 mL Anesthesia: Lidocaine 4% and topical decongestant was topically sprayed in each nasal cavity  Description of Procedure:  Patient was identified. A rigid 30 degree endoscope was utilized to evaluate the sinonasal cavities, mucosa, sinus ostia and turbinates and septum.  Overall, mild signs of mucosal inflammation are noted.  No mucopurulence, polyps, or masses noted.   Right Middle meatus: clear Right SE Recess: clear Left MM: clear Left SE Recess: clear   Photodocumentation was obtained.  CPT CODE -- 16109 - Mod  25   ASSESSMENT:  Mr. Lenzo is a 78 y.o. M with:  1. Other chronic sinusitis   2. BPPV (benign paroxysmal positional vertigo), right   3. Hypertrophy of both inferior nasal turbinates  4. Nasal congestion   5. Nasal septal deviation   6. Sensorineural hearing loss (SNHL), bilateral    Multiple issues today: Sinuses - Multiple exacerbations over past 2 years with disease noted recent CT. Prior allergy testing negative. Doing much better after medical management. Post-treatment endo reassuring. 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 Not sure etiology but given lack of imaging finding, will observe;  No vestibular sx currently so will observe.  We've discussed issues and options today.  We reviewed the nasal endoscopy images together.  The risks, benefits and alternatives were discussed and questions answered.  He has elected to proceed with finishing of medical management:  1) Continue Daily [facility] Sinus rinses 2) Continue flonase BID 3) Continue astelin BID - f/u in 1 year with repeat audio; can exposure HA through the [place] if he'd like; sooner if any concerns  See below regarding exact medications prescribed this encounter including dosages and route: No orders of the defined types were placed in this encounter.    Thank you for allowing me the opportunity to care for your patient. Please do not hesitate to contact me should you have any other questions.  Sincerely, Jovita Kussmaul, MD Otolaryngologist (ENT), [facility] Phone: [phone] Fax: [phone]  MDM:  Level 4: 99214 Complexity/Problems addressed: multiple chronic problems Data complexity: mod - independent interpretation of CT imaging 2024 - Morbidity: low  - Prescription Drug prescribed or managed: no  08/02/2023, 11:59 AM

## 2023-08-02 ENCOUNTER — Encounter (INDEPENDENT_AMBULATORY_CARE_PROVIDER_SITE_OTHER): Payer: Self-pay

## 2024-01-12 ENCOUNTER — Telehealth: Payer: Self-pay | Admitting: Gastroenterology

## 2024-01-12 NOTE — Telephone Encounter (Signed)
 Called and spoke with patient. Patient reports getting notification about upcoming appt with Dr. Timmy but he could not see the time. I informed patient that he is scheduled for labs at [facility] at 9:30 am then OV with Dr. Timmy at 9:45 am on 01/19/24. Patient verbalized understanding and had no concerns at the end of the call.

## 2024-01-12 NOTE — Telephone Encounter (Signed)
 Patient requesting f/u call to discuss upcoming apt he has on the 22? Please advise.

## 2024-01-19 ENCOUNTER — Other Ambulatory Visit: Payer: Self-pay

## 2024-01-19 ENCOUNTER — Inpatient Hospital Stay: Payer: Medicare Other | Admitting: Hematology & Oncology

## 2024-01-19 ENCOUNTER — Inpatient Hospital Stay

## 2024-01-19 ENCOUNTER — Inpatient Hospital Stay: Payer: Medicare Other | Attending: Hematology & Oncology

## 2024-01-19 ENCOUNTER — Encounter: Payer: Self-pay | Admitting: Hematology & Oncology

## 2024-01-19 ENCOUNTER — Other Ambulatory Visit: Payer: Self-pay | Admitting: *Deleted

## 2024-01-19 VITALS — BP 126/75 | HR 67 | Temp 98.0°F | Resp 18 | Ht 68.0 in | Wt 184.0 lb

## 2024-01-19 DIAGNOSIS — R3916 Straining to void: Secondary | ICD-10-CM

## 2024-01-19 DIAGNOSIS — D72829 Elevated white blood cell count, unspecified: Secondary | ICD-10-CM

## 2024-01-19 DIAGNOSIS — D72823 Leukemoid reaction: Secondary | ICD-10-CM

## 2024-01-19 DIAGNOSIS — E785 Hyperlipidemia, unspecified: Secondary | ICD-10-CM

## 2024-01-19 DIAGNOSIS — Z125 Encounter for screening for malignant neoplasm of prostate: Secondary | ICD-10-CM

## 2024-01-19 DIAGNOSIS — H8111 Benign paroxysmal vertigo, right ear: Secondary | ICD-10-CM

## 2024-01-19 LAB — CMP (CANCER CENTER ONLY)
ALT: 13 U/L (ref 0–44)
AST: 22 U/L (ref 15–41)
Albumin: 4.2 g/dL (ref 3.5–5.0)
Alkaline Phosphatase: 110 U/L (ref 38–126)
Anion gap: 11 (ref 5–15)
BUN: 18 mg/dL (ref 8–23)
CO2: 25 mmol/L (ref 22–32)
Calcium: 10.1 mg/dL (ref 8.9–10.3)
Chloride: 103 mmol/L (ref 98–111)
Creatinine: 1.15 mg/dL (ref 0.61–1.24)
GFR, Estimated: >60 mL/min (ref >60)
Glucose, Bld: 159 mg/dL — ABNORMAL HIGH (ref 70–99)
Potassium: 5.1 mmol/L (ref 3.5–5.1)
Sodium: 139 mmol/L (ref 135–145)
Total Bilirubin: 0.4 mg/dL (ref 0.0–1.2)
Total Protein: 7.1 g/dL (ref 6.5–8.1)

## 2024-01-19 LAB — CBC WITH DIFFERENTIAL (CANCER CENTER ONLY)
Abs Immature Granulocytes: 0.15 K/uL — ABNORMAL HIGH (ref 0.00–0.07)
Basophils Absolute: 0.1 K/uL (ref 0.0–0.1)
Basophils Relative: 1 %
Eosinophils Absolute: 0.1 K/uL (ref 0.0–0.5)
Eosinophils Relative: 1 %
HCT: 47 % (ref 39.0–52.0)
Hemoglobin: 15.7 g/dL (ref 13.0–17.0)
Immature Granulocytes: 1 %
Lymphocytes Relative: 18 %
Lymphs Abs: 2.3 K/uL (ref 0.7–4.0)
MCH: 30.4 pg (ref 26.0–34.0)
MCHC: 33.4 g/dL (ref 30.0–36.0)
MCV: 91.1 fL (ref 80.0–100.0)
Monocytes Absolute: 1 K/uL (ref 0.1–1.0)
Monocytes Relative: 8 %
Neutro Abs: 9.1 K/uL — ABNORMAL HIGH (ref 1.7–7.7)
Neutrophils Relative %: 71 %
Platelet Count: 247 K/uL (ref 150–400)
RBC: 5.16 MIL/uL (ref 4.22–5.81)
RDW: 13.4 % (ref 11.5–15.5)
WBC Count: 12.7 K/uL — ABNORMAL HIGH (ref 4.0–10.5)
nRBC: 0 % (ref 0.0–0.2)

## 2024-01-19 LAB — SAVE SMEAR(SSMR), FOR PROVIDER SLIDE REVIEW

## 2024-01-19 LAB — LACTATE DEHYDROGENASE: LDH: 151 U/L (ref 98–192)

## 2024-01-19 NOTE — Progress Notes (Signed)
 Hematology and Oncology Follow Up Visit  EDGEL DEGNAN [id number] 28-Aug-1945 78 y.o. 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 Jernigan is here today for follow-up.  We see him yearly.  Since we last saw him, he had lumbar spine surgery and has had bilateral hernia surgery.  Both surgeries went well for him.  Otherwise, he is doing pretty well.  He really has had no complaints.  He has had no problems with infection.  He has had no problems with cough or shortness of breath.  He has had no change in bowel or bladder habits..  He has noted that there has been some testicular swelling.  He does see a urologist.  Unfortunately, the Urologist did not do a PSA.  He wants to have a PSA done.  I will see about sending this off today.  He has had no rashes.  Has been no leg swelling.  He has had no bleeding..  Overall, I would have to say that his performance status is probably ECOG 1.     Medications:  Allergies as of 01/19/2024       Reactions   Codeine Nausea Only, Other (See Comments)   Other Reaction(s): GI Intolerance REACTION: Nausea        Medication List        Accurate as of January 19, 2024 10:46 AM. If you have any questions, ask your nurse or doctor.          acetaminophen  500 MG tablet Commonly known as: TYLENOL  Take 1 tablet (500 mg total) by mouth every 6 (six) hours as needed.   Acidophilus/Pectin Caps   amitriptyline  75 MG tablet Commonly known as: ELAVIL  Take 75 mg by mouth at bedtime. What changed: Another medication with the same name was removed. Continue taking this medication, and follow the directions you see here. Changed by: Maude JONELLE Crease   aspirin  EC 81 MG tablet Take 1 tablet (81 mg total) by mouth daily.   atorvastatin 80 MG tablet Commonly known as: LIPITOR Take 80 mg by mouth daily.   fluticasone  50 MCG/ACT nasal  spray Commonly known as: FLONASE  Place 2 sprays into both nostrils daily.   gabapentin  300 MG capsule Commonly known as: NEURONTIN  Take by mouth.   ibuprofen  400 MG tablet Commonly known as: ADVIL  Take 1.5 tablets (600 mg total) by mouth 3 (three) times daily.   Jardiance 25 MG Tabs tablet Generic drug: empagliflozin Take 25 mg by mouth daily.   lisinopril-hydrochlorothiazide 10-12.5 MG tablet Commonly known as: ZESTORETIC TAKE 1 TABLET(S) EVERY DAY BY ORAL ROUTE IN THE MORNING FOR 90 DAYS.   meclizine  25 MG tablet Commonly known as: ANTIVERT  Take 25 mg by mouth. What changed: Another medication with the same name was removed. Continue taking this medication, and follow the directions you see here. Changed by: Maude JONELLE Crease   meloxicam  15 MG tablet Commonly known as: MOBIC  Take 15 mg by mouth daily.   niacinamide 500 MG tablet Take 500 mg by mouth daily.   oxyCODONE -acetaminophen  5-325 MG tablet Commonly known as: PERCOCET/ROXICET Take 1 tablet by mouth every 12 (twelve) hours as needed for severe pain.   pantoprazole  40 MG tablet Commonly known as: PROTONIX  TAKE 1 TABLET BY MOUTH EVERY DAY   promethazine  25 MG tablet Commonly known as: PHENERGAN  Take 2 tablets every 8 hours  by oral route as needed for 90 days.   tamsulosin 0.4 MG Caps capsule Commonly known as: FLOMAX Take 0.4 mg by mouth.   tiZANidine  4 MG capsule Commonly known as: Zanaflex  Take 1 capsule (4 mg total) by mouth 2 (two) times daily as needed for muscle spasms.   Vitamin B-12 1000 MCG Subl PLACE 1 TABLET (1,000 MCG TOTAL) UNDER THE TONGUE DAILY.   Vitamin C 100 MG Chew Chew 1 tablet by mouth daily.        Allergies:  Allergies  Allergen Reactions   Codeine Nausea Only and Other (See Comments)    Other Reaction(s): GI Intolerance  REACTION: Nausea    Past Medical History, Surgical history, Social history, and Family History were reviewed and updated.  Review of  Systems: Review of Systems  Constitutional: Negative.   HENT: Negative.    Eyes: Negative.   Respiratory: Negative.    Cardiovascular: Negative.   Gastrointestinal: Negative.   Genitourinary: Negative.   Musculoskeletal:  Positive for back pain.  Skin: Negative.   Endo/Heme/Allergies: Negative.   Psychiatric/Behavioral: Negative.       Physical Exam:  height is 5' 8 (1.727 m) and weight is 184 lb (83.5 kg). His oral temperature is 98 F (36.7 C). His blood pressure is 126/75 and his pulse is 67. His respiration is 18 and oxygen saturation is 100%.   Wt Readings from Last 3 Encounters:  01/19/24 184 lb (83.5 kg)  07/30/23 184 lb (83.5 kg)  06/18/23 184 lb (83.5 kg)    Physical Exam Vitals reviewed.  HENT:     Head: Normocephalic and atraumatic.  Eyes:     Pupils: Pupils are equal, round, and reactive to light.  Cardiovascular:     Rate and Rhythm: Normal rate and regular rhythm.     Heart sounds: Normal heart sounds.  Pulmonary:     Effort: Pulmonary effort is normal.     Breath sounds: Normal breath sounds.  Abdominal:     General: Bowel sounds are normal.     Palpations: Abdomen is soft.  Musculoskeletal:        General: No tenderness or deformity. Normal range of motion.     Cervical back: Normal range of motion.  Lymphadenopathy:     Cervical: No cervical adenopathy.  Skin:    General: Skin is warm and dry.     Findings: No erythema or rash.  Neurological:     Mental Status: He is alert and oriented to person, place, and time.  Psychiatric:        Behavior: Behavior normal.        Thought Content: Thought content normal.        Judgment: Judgment normal.      Lab Results  Component Value Date   WBC 12.7 (H) 01/19/2024   HGB 15.7 01/19/2024   HCT 47.0 01/19/2024   MCV 91.1 01/19/2024   PLT 247 01/19/2024   Lab Results  Component Value Date   FERRITIN 244.0 11/06/2009   IRON 64 11/06/2009   IRONPCTSAT 20.9 11/06/2009   Lab Results  Component  Value Date   RBC 5.16 01/19/2024   No results found for: KPAFRELGTCHN, LAMBDASER, [facility] Lab Results  Component Value Date   IGA 212 11/06/2009   No results found for: STEPHANY RINGS, A1GS, A2GS, BETS, BETA2SER, GAMS, MSPIKE, SPEI   Chemistry      Component Value Date/Time   NA 139 01/19/2024 0928   K 5.1 01/19/2024 0928   CL  103 01/19/2024 0928   CO2 25 01/19/2024 0928   BUN 18 01/19/2024 0928   CREATININE 1.15 01/19/2024 0928      Component Value Date/Time   CALCIUM  10.1 01/19/2024 0928   ALKPHOS 110 01/19/2024 0928   AST 22 01/19/2024 0928   ALT 13 01/19/2024 0928   BILITOT 0.4 01/19/2024 0928       Impression and Plan: Mr. Deal is a very pleasant 78 yo caucasian gentleman with chronic reactive leukocytosis.  Again, he had a thorough work-up.  He has not had a bone marrow biopsy done which he does not need to have done.  I think the key is that he is negative for JAK2 mutations.  Clearly, his blood count is holding steady.  I looked at his blood smear under the microscope.  Everything still looks pretty much bland..  I do not see any immature myeloid cells.  I do not see any nucleated red blood cells.  There are no teardrop cells.  There is no large platelets.  Again, we will follow him yearly.  We will see what his PSA shows.   Maude JONELLE Crease, MD 7/22/202510:46 AM

## 2024-01-20 LAB — PSA, TOTAL AND FREE
PSA, Free Pct: 37.1 %
PSA, Free: 3.12 ng/mL
Prostate Specific Ag, Serum: 8.4 ng/mL — ABNORMAL HIGH (ref 0.0–4.0)

## 2024-01-22 ENCOUNTER — Ambulatory Visit: Payer: Self-pay | Admitting: Hematology & Oncology

## 2025-01-18 ENCOUNTER — Ambulatory Visit: Admitting: Hematology & Oncology

## 2025-01-18 ENCOUNTER — Inpatient Hospital Stay
